# Patient Record
Sex: Male | Born: 1945 | Race: Black or African American | Hispanic: No | Smoking: Former smoker
Health system: Southern US, Community
[De-identification: ages and names within clinical notes are randomized; demographics above are authoritative.]

## PROBLEM LIST (undated history)

## (undated) DIAGNOSIS — R339 Retention of urine, unspecified: Secondary | ICD-10-CM

## (undated) DIAGNOSIS — E039 Hypothyroidism, unspecified: Secondary | ICD-10-CM

## (undated) DIAGNOSIS — E119 Type 2 diabetes mellitus without complications: Secondary | ICD-10-CM

## (undated) DIAGNOSIS — F1411 Cocaine abuse, in remission: Secondary | ICD-10-CM

## (undated) DIAGNOSIS — I5040 Unspecified combined systolic (congestive) and diastolic (congestive) heart failure: Secondary | ICD-10-CM

## (undated) DIAGNOSIS — I10 Essential (primary) hypertension: Secondary | ICD-10-CM

## (undated) DIAGNOSIS — K750 Abscess of liver: Secondary | ICD-10-CM

## (undated) DIAGNOSIS — D649 Anemia, unspecified: Secondary | ICD-10-CM

## (undated) DIAGNOSIS — I639 Cerebral infarction, unspecified: Secondary | ICD-10-CM

## (undated) DIAGNOSIS — Z960 Presence of urogenital implants: Secondary | ICD-10-CM

## (undated) DIAGNOSIS — E785 Hyperlipidemia, unspecified: Secondary | ICD-10-CM

## (undated) DIAGNOSIS — Z978 Presence of other specified devices: Secondary | ICD-10-CM

## (undated) DIAGNOSIS — I429 Cardiomyopathy, unspecified: Secondary | ICD-10-CM

## (undated) HISTORY — PX: TIBIA FRACTURE SURGERY: SHX806

## (undated) HISTORY — PX: APPENDECTOMY: SHX54

## (undated) HISTORY — PX: INGUINAL HERNIA REPAIR: SUR1180

---

## 2004-04-28 ENCOUNTER — Inpatient Hospital Stay (HOSPITAL_COMMUNITY): Admission: EM | Admit: 2004-04-28 | Discharge: 2004-04-29 | Payer: Self-pay | Admitting: Emergency Medicine

## 2005-02-11 ENCOUNTER — Ambulatory Visit: Payer: Self-pay | Admitting: Cardiology

## 2005-02-11 ENCOUNTER — Ambulatory Visit: Payer: Self-pay | Admitting: Physical Medicine & Rehabilitation

## 2005-02-11 ENCOUNTER — Inpatient Hospital Stay (HOSPITAL_COMMUNITY): Admission: EM | Admit: 2005-02-11 | Discharge: 2005-02-15 | Payer: Self-pay | Admitting: Emergency Medicine

## 2005-02-13 ENCOUNTER — Encounter (INDEPENDENT_AMBULATORY_CARE_PROVIDER_SITE_OTHER): Payer: Self-pay | Admitting: Cardiology

## 2005-04-27 ENCOUNTER — Ambulatory Visit: Payer: Self-pay | Admitting: Cardiology

## 2005-05-11 ENCOUNTER — Ambulatory Visit: Payer: Self-pay | Admitting: Cardiology

## 2005-05-11 ENCOUNTER — Ambulatory Visit: Payer: Self-pay

## 2005-05-17 ENCOUNTER — Inpatient Hospital Stay (HOSPITAL_BASED_OUTPATIENT_CLINIC_OR_DEPARTMENT_OTHER): Admission: RE | Admit: 2005-05-17 | Discharge: 2005-05-17 | Payer: Self-pay | Admitting: Cardiology

## 2005-05-17 ENCOUNTER — Ambulatory Visit: Payer: Self-pay | Admitting: Cardiology

## 2005-05-29 ENCOUNTER — Ambulatory Visit: Payer: Self-pay | Admitting: Cardiology

## 2007-01-01 ENCOUNTER — Emergency Department (HOSPITAL_COMMUNITY): Admission: EM | Admit: 2007-01-01 | Discharge: 2007-01-01 | Payer: Self-pay | Admitting: Emergency Medicine

## 2008-03-23 ENCOUNTER — Emergency Department (HOSPITAL_COMMUNITY): Admission: EM | Admit: 2008-03-23 | Discharge: 2008-03-23 | Payer: Self-pay | Admitting: Emergency Medicine

## 2008-10-30 DIAGNOSIS — I639 Cerebral infarction, unspecified: Secondary | ICD-10-CM

## 2008-10-30 HISTORY — DX: Cerebral infarction, unspecified: I63.9

## 2009-08-20 ENCOUNTER — Ambulatory Visit: Payer: Self-pay | Admitting: Family Medicine

## 2009-08-20 ENCOUNTER — Inpatient Hospital Stay (HOSPITAL_COMMUNITY): Admission: EM | Admit: 2009-08-20 | Discharge: 2009-08-26 | Payer: Self-pay | Admitting: Emergency Medicine

## 2009-08-23 ENCOUNTER — Ambulatory Visit: Payer: Self-pay | Admitting: Vascular Surgery

## 2009-08-23 ENCOUNTER — Encounter: Payer: Self-pay | Admitting: Family Medicine

## 2009-08-24 ENCOUNTER — Ambulatory Visit: Payer: Self-pay | Admitting: Physical Medicine & Rehabilitation

## 2009-08-26 ENCOUNTER — Ambulatory Visit: Payer: Self-pay | Admitting: Physical Medicine & Rehabilitation

## 2009-08-26 ENCOUNTER — Inpatient Hospital Stay (HOSPITAL_COMMUNITY)
Admission: RE | Admit: 2009-08-26 | Discharge: 2009-09-16 | Payer: Self-pay | Admitting: Physical Medicine & Rehabilitation

## 2009-10-06 ENCOUNTER — Encounter: Admission: RE | Admit: 2009-10-06 | Discharge: 2009-10-27 | Payer: Self-pay | Admitting: Internal Medicine

## 2009-10-26 ENCOUNTER — Encounter: Admission: RE | Admit: 2009-10-26 | Discharge: 2010-01-11 | Payer: Self-pay | Admitting: Internal Medicine

## 2009-12-01 ENCOUNTER — Emergency Department (HOSPITAL_COMMUNITY): Admission: EM | Admit: 2009-12-01 | Discharge: 2009-12-01 | Payer: Self-pay | Admitting: Emergency Medicine

## 2011-01-18 LAB — URINALYSIS, ROUTINE W REFLEX MICROSCOPIC
Bilirubin Urine: NEGATIVE
Glucose, UA: NEGATIVE mg/dL
Ketones, ur: NEGATIVE mg/dL
Nitrite: NEGATIVE
Specific Gravity, Urine: 1.03 (ref 1.005–1.030)
Urobilinogen, UA: 1 mg/dL (ref 0.0–1.0)
pH: 6 (ref 5.0–8.0)

## 2011-01-18 LAB — URINE CULTURE: Culture: NO GROWTH

## 2011-02-02 LAB — COMPREHENSIVE METABOLIC PANEL
ALT: 12 U/L (ref 0–53)
Albumin: 3.9 g/dL (ref 3.5–5.2)
Albumin: 4 g/dL (ref 3.5–5.2)
Alkaline Phosphatase: 57 U/L (ref 39–117)
Alkaline Phosphatase: 60 U/L (ref 39–117)
BUN: 16 mg/dL (ref 6–23)
CO2: 23 mEq/L (ref 19–32)
Calcium: 9.3 mg/dL (ref 8.4–10.5)
Creatinine, Ser: 1.1 mg/dL (ref 0.4–1.5)
GFR calc non Af Amer: >60 mL/min (ref >60)
Potassium: 4.2 mEq/L (ref 3.5–5.1)
Total Bilirubin: 0.6 mg/dL (ref 0.3–1.2)
Total Protein: 7.5 g/dL (ref 6.0–8.3)
Total Protein: 8 g/dL (ref 6.0–8.3)

## 2011-02-02 LAB — CBC
HCT: 39.3 % (ref 39.0–52.0)
HCT: 39.8 % (ref 39.0–52.0)
HCT: 40.6 % (ref 39.0–52.0)
HCT: 40.8 % (ref 39.0–52.0)
HCT: 42.4 % (ref 39.0–52.0)
Hemoglobin: 13.4 g/dL (ref 13.0–17.0)
Hemoglobin: 13.7 g/dL (ref 13.0–17.0)
Hemoglobin: 13.7 g/dL (ref 13.0–17.0)
Hemoglobin: 14.9 g/dL (ref 13.0–17.0)
MCHC: 34 g/dL (ref 30.0–36.0)
MCHC: 34.2 g/dL (ref 30.0–36.0)
MCV: 92.2 fL (ref 78.0–100.0)
MCV: 92.8 fL (ref 78.0–100.0)
Platelets: 176 10*3/uL (ref 150–400)
Platelets: 212 10*3/uL (ref 150–400)
Platelets: 215 10*3/uL (ref 150–400)
RBC: 4.39 MIL/uL (ref 4.22–5.81)
RDW: 13 % (ref 11.5–15.5)
RDW: 13.1 % (ref 11.5–15.5)
RDW: 13.2 % (ref 11.5–15.5)
WBC: 7.1 10*3/uL (ref 4.0–10.5)
WBC: 9 10*3/uL (ref 4.0–10.5)

## 2011-02-02 LAB — RAPID URINE DRUG SCREEN, HOSP PERFORMED
Barbiturates: NOT DETECTED
Benzodiazepines: NOT DETECTED
Opiates: NOT DETECTED

## 2011-02-02 LAB — DIFFERENTIAL
Basophils Absolute: 0 10*3/uL (ref 0.0–0.1)
Basophils Relative: 0 % (ref 0–1)
Eosinophils Absolute: 0 10*3/uL (ref 0.0–0.7)
Eosinophils Relative: 0 % (ref 0–5)
Lymphocytes Relative: 12 % (ref 12–46)
Lymphocytes Relative: 35 % (ref 12–46)
Lymphs Abs: 2.2 10*3/uL (ref 0.7–4.0)
Monocytes Absolute: 0.7 10*3/uL (ref 0.1–1.0)
Monocytes Relative: 10 % (ref 3–12)
Monocytes Relative: 4 % (ref 3–12)
Neutro Abs: 3.4 10*3/uL (ref 1.7–7.7)
Neutro Abs: 8.7 10*3/uL — ABNORMAL HIGH (ref 1.7–7.7)
Neutrophils Relative %: 53 % (ref 43–77)

## 2011-02-02 LAB — CARDIAC PANEL(CRET KIN+CKTOT+MB+TROPI)
CK, MB: 1.7 ng/mL (ref 0.3–4.0)
CK, MB: 1.8 ng/mL (ref 0.3–4.0)
Relative Index: 1.4 (ref 0.0–2.5)
Relative Index: 1.5 (ref 0.0–2.5)
Total CK: 125 U/L (ref 7–232)

## 2011-02-02 LAB — BASIC METABOLIC PANEL
BUN: 11 mg/dL (ref 6–23)
CO2: 22 mEq/L (ref 19–32)
Calcium: 9.1 mg/dL (ref 8.4–10.5)
GFR calc Af Amer: >60 mL/min (ref >60)
GFR calc non Af Amer: >60 mL/min (ref >60)
GFR calc non Af Amer: >60 mL/min (ref >60)
Glucose, Bld: 107 mg/dL — ABNORMAL HIGH (ref 70–99)
Potassium: 3.6 mEq/L (ref 3.5–5.1)
Sodium: 137 mEq/L (ref 135–145)
Sodium: 139 mEq/L (ref 135–145)

## 2011-02-02 LAB — POCT CARDIAC MARKERS
Myoglobin, poc: 169 ng/mL (ref 12–200)
Troponin i, poc: <0.05 ng/mL (ref 0.00–0.09)

## 2011-02-02 LAB — LIPID PANEL
Cholesterol: 198 mg/dL (ref 0–200)
Total CHOL/HDL Ratio: 5.8 RATIO
Triglycerides: 139 mg/dL (ref <150)

## 2011-02-02 LAB — T4, FREE: Free T4: 0.84 ng/dL (ref 0.80–1.80)

## 2011-02-02 LAB — TSH: TSH: 26.675 u[IU]/mL — ABNORMAL HIGH (ref 0.350–4.500)

## 2011-02-02 LAB — GLUCOSE, CAPILLARY: Glucose-Capillary: 181 mg/dL — ABNORMAL HIGH (ref 70–99)

## 2011-03-17 NOTE — Cardiovascular Report (Signed)
NAME:  Brad Harrell, PULS NO.:  [id number]   MEDICAL RECORD NO.:  [id number]          PATIENT TYPE:  OIB   LOCATION:  6501                         FACILITY:  [facility]   PHYSICIAN:  Brad Harrell, M.D. [facility] DATE OF BIRTH:  05-24-1946   DATE OF PROCEDURE:  05/17/2005  DATE OF DISCHARGE:                              CARDIAC CATHETERIZATION   CLINICAL HISTORY:  Brad Harrell is 65 years old and was admitted in April  2006 with a stroke. An echocardiogram showed ejection fraction of 35-40%. He  had history of alcohol and substance abuse. He saw Dr. Antoine Harrell back who  arranged for him to have a Cardiolite scan. On the day of the scan his ECG  showed some new anterolateral and inferior T wave changes and we elected not  to proceed with the scan, and to instead evaluate him with coronary  angiography which was scheduled today in the outpatient laboratory.   PROCEDURE:  Right heart catheterization was performed percutaneously by the  right femoral vein using a venous sheath and Swan Ganz thermodilution  catheterization. Left heart catheterization was performed percutaneously by  the right femoral artery and arterial sheath and 6 French preformed coronary  catheters. A femoral wall arterial punch was performed and Omnipaque  contrast was used. The patient tolerated the procedure well and left the  laboratory in satisfactory condition.   RESULTS:  The left main coronary artery had some mild tapering at its distal  portion estimated at about 30%.   The left anterior descending coronary artery gave rise to a diagonal branch  and three septal perforators. There was 40% narrowing at the ostium which  extended slightly into the left main coronary artery.   The circumflex artery gave rise to a margin branch and two posterolateral  branches. There was a 90% ostial stenosis in the circumflex artery.   The right coronary artery was a moderate-sized vessel, gave rise to right  ventricular  branches, a posterior descending branch, and two posterolateral  branches. These vessels were free of significant disease.   The left ventriculogram performed in the ROA projection shows global  hypokinesis with an estimated ejection fraction of 35%.   The left ventriculogram performed in the LOA projection also showed global  hypokinesis.   HEMODYNAMIC DATA:  The right atrial pressure was 8 mean. The pulmonary  artery pressure was 35/15 with a mean of 21. Pulmonary wedge pressure was 10  mean. Left ventricular pressure was 136/7. The aortic pressure was 136/74  with mean of 96. Cardiac output/cardiac index was 3.6/2.1 L/minute/sqm.   CONCLUSION:  1.  Nonischemic cardiomyopathy with an ejection fraction of 35%.  2.  Coronary artery disease with 30% narrowing in the distal left main      coronary artery, 40% narrowing in the ostium of the left anterior      descending coronary artery, 90% ostium in the circumflex artery, and no      significant obstruction of the right coronary artery.   RECOMMENDATIONS:  The patient has what appears to be a significant lesion in  the ostium of the circumflex artery  but this is unfavorable for PCI due to  its ostial location and due to disease in the left main and the ostium of  the LAD contiguous to this. The patient is not having any symptoms. I think  the best option is medical therapy. Will plan to let the patient go home  today and see Dr. Antoine Harrell in follow-up.       BB/MEDQ  D:  05/17/2005  T:  05/17/2005  Job:  [id number]   cc:   Brad Harrell, M.D.  [address]  [address]  [place]  [place] [address]   Cardiopulmonary Lab
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
## 2011-03-17 NOTE — Discharge Summary (Signed)
NAMEALUCARD, FEARNOW              ACCOUNT NO.:  [id number]   MEDICAL RECORD NO.:  [id number]          PATIENT TYPE:  INP   LOCATION:  3003                         FACILITY:  [facility]   PHYSICIAN:  Melissa L. Ladona Ridgel, MD  DATE OF BIRTH:  15-Nov-1945   DATE OF ADMISSION:  02/11/2005  DATE OF DISCHARGE:  02/15/2005                                 DISCHARGE SUMMARY   HISTORY OF PRESENT ILLNESS:  Please see previously dictated Discharge  Summary, and note that the patient continued to work with physical therapy  during the course of his hospital stay which was determined to be  inappropriate for inpatient rehab stay.  He was observed today walking with  physical therapy, and appears to be progressing well on his own.  We will,  therefore, support the discharge of the patient home with home physical  therapy.  The patient was evaluated by Cardiology for his newly discovered  cardiomyopathy.  He will be followed as an outpatient for further management  of his cardiomyopathy.  There is a potential for cardiac catheterization as  an outpatient.  Of note, the patient underwent evaluation for thyroid  disease and was found to have an elevated TSH at 9.528, likely consistent  with hypothyroidism.  We would, therefore, start him on low dose Synthroid  and have him follow up with his primary care physician for further  evaluation.  The patient also underwent chest x-ray, the advise of the  cardiologist who found no acute disease on this film.   PHYSICAL EXAMINATION:  VITAL SIGNS:  At this time, the patient's physical  examination remains unchanged.  His vital signs remain stable.  Temperature  97.4, blood pressure 108/63, pulse 72, respirations 20, saturation 96%.  GENERAL APPEARANCE:  He is in no acute distress.  HEENT:  Pupils equal, round and reactive to light.  Extraocular muscles  intact.  Mucous membranes moist.  NECK:  Supple.  No JVD or lymph nodes.  No carotid bruits.  CHEST:  Clear to  auscultation.  No rhonchi, rales or wheezes.  CARDIOVASCULAR:  Regular rate and rhythm, positive S1, S2.  No S3, S4.  ABDOMEN:  Soft, nontender, nondistended.  EXTREMITIES:  He remains with some left lower extremity weakness and a  minimal left upper extremity weakness.  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   DISPOSITION:  At this time, the patient is deemed stable for discharge to  home.   FOLLOWUP:  Home Health Physical Therapy.   DISCHARGE INSTRUCTIONS:  The patient will not be permitted to drive  secondary to his left hemi-neglect.  He, again, has been instructed to avoid  cocaine and will receive a call from [facility] for outpatient  follow up.      MLT/MEDQ  D:  02/15/2005  T:  02/15/2005  Job:  [id number]

## 2011-04-30 ENCOUNTER — Emergency Department (HOSPITAL_COMMUNITY): Payer: Medicare Other

## 2011-04-30 ENCOUNTER — Inpatient Hospital Stay (HOSPITAL_COMMUNITY)
Admission: EM | Admit: 2011-04-30 | Discharge: 2011-05-05 | DRG: 065 | Disposition: A | Discharge status: Skilled Nursing Facility | Payer: Medicare Other | Attending: Internal Medicine | Admitting: Internal Medicine

## 2011-04-30 DIAGNOSIS — Z8249 Family history of ischemic heart disease and other diseases of the circulatory system: Secondary | ICD-10-CM

## 2011-04-30 DIAGNOSIS — E7800 Pure hypercholesterolemia, unspecified: Secondary | ICD-10-CM | POA: Diagnosis present

## 2011-04-30 DIAGNOSIS — Z8673 Personal history of transient ischemic attack (TIA), and cerebral infarction without residual deficits: Secondary | ICD-10-CM

## 2011-04-30 DIAGNOSIS — Z87891 Personal history of nicotine dependence: Secondary | ICD-10-CM

## 2011-04-30 DIAGNOSIS — I5022 Chronic systolic (congestive) heart failure: Secondary | ICD-10-CM | POA: Diagnosis present

## 2011-04-30 DIAGNOSIS — G8190 Hemiplegia, unspecified affecting unspecified side: Secondary | ICD-10-CM | POA: Diagnosis present

## 2011-04-30 DIAGNOSIS — E039 Hypothyroidism, unspecified: Secondary | ICD-10-CM | POA: Diagnosis present

## 2011-04-30 DIAGNOSIS — Z833 Family history of diabetes mellitus: Secondary | ICD-10-CM

## 2011-04-30 DIAGNOSIS — F1411 Cocaine abuse, in remission: Secondary | ICD-10-CM | POA: Diagnosis present

## 2011-04-30 DIAGNOSIS — Z794 Long term (current) use of insulin: Secondary | ICD-10-CM

## 2011-04-30 DIAGNOSIS — I10 Essential (primary) hypertension: Secondary | ICD-10-CM | POA: Diagnosis present

## 2011-04-30 DIAGNOSIS — E785 Hyperlipidemia, unspecified: Secondary | ICD-10-CM | POA: Diagnosis present

## 2011-04-30 DIAGNOSIS — I6350 Cerebral infarction due to unspecified occlusion or stenosis of unspecified cerebral artery: Principal | ICD-10-CM | POA: Diagnosis present

## 2011-04-30 DIAGNOSIS — Z7902 Long term (current) use of antithrombotics/antiplatelets: Secondary | ICD-10-CM

## 2011-04-30 DIAGNOSIS — I428 Other cardiomyopathies: Secondary | ICD-10-CM | POA: Diagnosis present

## 2011-04-30 DIAGNOSIS — F1011 Alcohol abuse, in remission: Secondary | ICD-10-CM | POA: Diagnosis present

## 2011-04-30 DIAGNOSIS — I69959 Hemiplegia and hemiparesis following unspecified cerebrovascular disease affecting unspecified side: Secondary | ICD-10-CM

## 2011-04-30 DIAGNOSIS — H5440 Blindness, one eye, unspecified eye: Secondary | ICD-10-CM | POA: Diagnosis present

## 2011-04-30 DIAGNOSIS — E119 Type 2 diabetes mellitus without complications: Secondary | ICD-10-CM | POA: Diagnosis present

## 2011-04-30 DIAGNOSIS — I509 Heart failure, unspecified: Secondary | ICD-10-CM | POA: Diagnosis present

## 2011-04-30 LAB — COMPREHENSIVE METABOLIC PANEL
ALT: 18 U/L (ref 0–53)
Alkaline Phosphatase: 65 U/L (ref 39–117)
CO2: 21 mEq/L (ref 19–32)
Calcium: 9.9 mg/dL (ref 8.4–10.5)
GFR calc Af Amer: >60 mL/min (ref >60)
GFR calc non Af Amer: >60 mL/min (ref >60)
Glucose, Bld: 226 mg/dL — ABNORMAL HIGH (ref 70–99)
Potassium: 4 mEq/L (ref 3.5–5.1)
Sodium: 138 mEq/L (ref 135–145)
Total Bilirubin: 0.3 mg/dL (ref 0.3–1.2)

## 2011-04-30 LAB — URINALYSIS, ROUTINE W REFLEX MICROSCOPIC
Glucose, UA: >1000 mg/dL — AB
Ketones, ur: NEGATIVE mg/dL
Leukocytes, UA: NEGATIVE
pH: 5.5 (ref 5.0–8.0)

## 2011-04-30 LAB — POCT I-STAT, CHEM 8
BUN: 16 mg/dL (ref 6–23)
Creatinine, Ser: 0.9 mg/dL (ref 0.50–1.35)
Glucose, Bld: 231 mg/dL — ABNORMAL HIGH (ref 70–99)
HCT: 45 % (ref 39.0–52.0)
Potassium: 4 mEq/L (ref 3.5–5.1)
Sodium: 139 mEq/L (ref 135–145)

## 2011-04-30 LAB — DIFFERENTIAL
Lymphocytes Relative: 29 % (ref 12–46)
Monocytes Absolute: 0.8 10*3/uL (ref 0.1–1.0)
Monocytes Relative: 9 % (ref 3–12)
Neutro Abs: 5.3 10*3/uL (ref 1.7–7.7)
Neutrophils Relative %: 61 % (ref 43–77)

## 2011-04-30 LAB — CBC
HCT: 40.7 % (ref 39.0–52.0)
Hemoglobin: 14.3 g/dL (ref 13.0–17.0)
MCH: 30.4 pg (ref 26.0–34.0)
MCHC: 35.1 g/dL (ref 30.0–36.0)
RBC: 4.7 MIL/uL (ref 4.22–5.81)

## 2011-04-30 LAB — RAPID URINE DRUG SCREEN, HOSP PERFORMED
Amphetamines: NOT DETECTED
Cocaine: NOT DETECTED
Opiates: NOT DETECTED

## 2011-04-30 LAB — URINE MICROSCOPIC-ADD ON

## 2011-04-30 LAB — CK TOTAL AND CKMB (NOT AT ARMC): Total CK: 122 U/L (ref 7–232)

## 2011-04-30 LAB — PROTIME-INR: Prothrombin Time: 13.1 seconds (ref 11.6–15.2)

## 2011-05-01 ENCOUNTER — Inpatient Hospital Stay (HOSPITAL_COMMUNITY): Payer: Medicare Other

## 2011-05-01 LAB — COMPREHENSIVE METABOLIC PANEL
ALT: 16 U/L (ref 0–53)
AST: 17 U/L (ref 0–37)
Albumin: 3.8 g/dL (ref 3.5–5.2)
Alkaline Phosphatase: 55 U/L (ref 39–117)
Chloride: 105 mEq/L (ref 96–112)
Potassium: 3.9 mEq/L (ref 3.5–5.1)
Total Bilirubin: 0.5 mg/dL (ref 0.3–1.2)

## 2011-05-01 LAB — LIPID PANEL
LDL Cholesterol: 63 mg/dL (ref 0–99)
Total CHOL/HDL Ratio: 4.8 RATIO
VLDL: 33 mg/dL (ref 0–40)

## 2011-05-01 LAB — CBC
Hemoglobin: 13.1 g/dL (ref 13.0–17.0)
MCH: 29.4 pg (ref 26.0–34.0)
MCHC: 33.9 g/dL (ref 30.0–36.0)
MCV: 86.8 fL (ref 78.0–100.0)
RBC: 4.46 MIL/uL (ref 4.22–5.81)

## 2011-05-01 LAB — CARDIAC PANEL(CRET KIN+CKTOT+MB+TROPI)
CK, MB: 3.1 ng/mL (ref 0.3–4.0)
Relative Index: 2.2 (ref 0.0–2.5)
Relative Index: 2 (ref 0.0–2.5)
Troponin I: <0.3 ng/mL (ref <0.30)

## 2011-05-01 LAB — GLUCOSE, CAPILLARY
Glucose-Capillary: 155 mg/dL — ABNORMAL HIGH (ref 70–99)
Glucose-Capillary: 172 mg/dL — ABNORMAL HIGH (ref 70–99)

## 2011-05-01 LAB — HEMOGLOBIN A1C
Hgb A1c MFr Bld: 7.1 % — ABNORMAL HIGH (ref <5.7)
Mean Plasma Glucose: 157 mg/dL — ABNORMAL HIGH (ref <117)

## 2011-05-01 NOTE — H&P (Signed)
NAME:  Brad Harrell, Brad Harrell NO.:  [id number]  MEDICAL RECORD NO.:  [id number]  LOCATION:  [facility]                         FACILITY:  [facility]  PHYSICIAN:  Eduard Clos, MDDATE OF BIRTH:  16-Jun-1946  DATE OF ADMISSION:  04/30/2011 DATE OF DISCHARGE:                             HISTORY & PHYSICAL   PRIMARY CARE PHYSICIAN:  Fleet Contras, MD  CHIEF COMPLAINT:  Difficulty walking with left lower extremity weakness.  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  He went to the bathroom, after which he was not able to get up from the commode and he needed help mainly because he was not able to walk and he felt very weak, particularly in the left lower extremity. He said he is able to move his extremities but he is not able to put pressure on it as it is giving way.  He did not have any headache, visual symptoms.  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  He did not have any difficulty speaking or swallowing.  He did not lose function of the left upper extremity.  He has mild weakness on the right upper and right lower extremity which did not change.  He did not lose consciousness.  He was brought in the ER, he had a CT head without contrast which did not show anything acute.  Neurologist, Dr. Marjory Lies, was already called and I also spoke with Dr. Marjory Lies.  As per Dr. Marjory Lies at this time, he is not a candidate for any tPA as he is not having any definite new focal deficit and he has had similar complaints previously and Dr. Marjory Lies is at this time also evaluating the patient, we will follow the recommendations.  The patient denies any chest pain, shortness of breath, nausea, vomiting, abdominal pain, dysuria, discharge,  diarrhea.  Denies any headache.  Denies any fever, chills, cough, or phlegm.  PAST MEDICAL HISTORY: 1. History of hypertension. 2. Hyperlipidemia. 3. Hypothyroidism. 4. 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 5. History of cardiomyopathy with EF last measured in October 2010 was     30-35%. 6. History of polysubstance abuse including cocaine.  As per family,     the last time he used cocaine was in 2010.  MEDICATIONS PRIOR TO ADMISSION:  They do not remember the exact names, they said he takes a medicine for cholesterol and they do not recall exact doses and the names, we need to verify.  ALLERGIES:  No known drug allergies.  PAST SURGICAL HISTORY:  As per E-chart, he had appendectomy, cardiac cath, and repair of jaw fracture.  SOCIAL HISTORY:  The patient lives with the family, used to smoke cigarettes, drink alcohol and abuse drugs including cocaine which he states and his family state he has not been using since 2012.  FAMILY HISTORY:  Significant for mother having enlarged heart.  REVIEW OF SYSTEMS:  As per the history of presenting  illness, nothing else significant.  PHYSICAL EXAMINATION:  GENERAL:  The patient was examined at bedside, not in acute distress. VITAL SIGNS:  Blood pressure is 156/96, pulse 97 per minute, temperature 98, respirations 18 per minute, O2 sat 100%. HEENT:  The patient is blind in the right eye.  Face:  He has mild left facial weakness in the left lower half.  Tongue is midline.  No neck rigidity.  No discharge from ears, eyes, nose, or mouth. CHEST:  Bilateral air entry present.  No rhonchi, no crepitation. HEART:  S1 and S2 heard. ABDOMEN:  Soft, nontender.  Bowel sounds heard. CENTRAL NERVOUS SYSTEM:  The patient is alert, awake, oriented to time, place, and person.  He is able to move upper and lower extremities.  The right upper and right lower extremities are like 4/5 with decreased  grip strength which is chronic.  Left upper extremity is 5/5 and left lower extremity, also he is able to move well.  A few minutes ago when the tech tried to make him walk, he was not able to walk by himself because of the left leg giving way.  There is no pronator drift.  There is no dysdiadochokinesia and he does have difficulty walking. EXTREMITIES:  At this time does not show any edema.  No acute ischemic changes.  LABORATORY DATA:  EKG shows normal sinus rhythm, heart rate of 101 beats per minute with nonspecific ST-T changes, QRS 98 milliseconds, QTC is 456 milliseconds. CT of the head without contrast shows atrophy and small vessel disease, remote bifrontal infarcts, no acute intracranial findings. CBC:  WBC is 8.7, hemoglobin is 15.3, hematocrit is 45, platelets are 192.  PT and INR are 13.1 and 0.97.  Complete metabolic panel:  Sodium 139, potassium 4, chloride 112, carbon dioxide 21, anion gap is 6, glucose 231, BUN 16, creatinine 0.9, total bilirubin is 0.3, alkaline phosphatase 65, AST 19, ALT 18, total protein 8.5, albumin 4.3, calcium 9.9.  CK is 122, MB is 2.3, relative index 1.9, troponin less than 0.3.  ASSESSMENT: 1. Possible cerebrovascular accident. 2. History of previous cerebrovascular accident with right-sided     hemiparesis and right eye blindness. 3. History of previous polysubstance abuse. 4. Hyperglycemia, concerning for new-onset diabetes mellitus type 2. 5. History of hypertension. 6. History of hypothyroidism. 7. History of hyperlipidemia.  PLAN: 1. At this time, I will admit the patient to telemetry. 2. For his difficulty walking and with history of previous CVA, at     this time my concern is for a new CVA.  I did discuss with Dr.     Marjory Lies, Neurologist on-call, and feels that he is not a     candidate for tPA at this time because of his previous CVAs and     similar complaints and he is not having any definite focal     deficits.  He does  have weakness of the left lower extremity when     he tries to walk.  Dr. Marjory Lies is evaluating the patient at this     time, we will follow the recommendation.  At this time, we are     going to get MRI of the brain, MRA of the brain, 2-D echo, carotid     Doppler. 3. Hyperglycemia.  We will check hemoglobin A1c.  At this time, the     patient be on CBG a.c. and nightly and sliding scale coverage.  If     he does have a  hemoglobin A1c which is high and suggestive of     diabetes, then we need to start some antidiabetic medication. 4. History of hypertension and hypothyroidism.  At this time, we need     to verify his home medication.  We will check TSH.  At this time,     the patient will be on p.r.n. labetalol for blood pressure,     systolic more than 200s. 5. History of low EF.  At this time, we are going to recheck a 2-D     echo.  At this time, only gently we are going to hydrate the     patient and make sure that he does not get fluid overload. 6. The patient will be on aspirin.  We will also check a urine drug     screen and chest x-ray as the patient previously had history of     cigarette smoking and further recommendation as condition evolves.     Eduard Clos, MD     ANK/MEDQ  D:  04/30/2011  T:  04/30/2011  Job:  [id number]  cc:   Fleet Contras, M.D.  Electronically Signed by Midge Minium MD on 05/01/2011 06:26:19 AM

## 2011-05-02 DIAGNOSIS — I69993 Ataxia following unspecified cerebrovascular disease: Secondary | ICD-10-CM

## 2011-05-02 DIAGNOSIS — G8100 Flaccid hemiplegia affecting unspecified side: Secondary | ICD-10-CM

## 2011-05-02 LAB — GLUCOSE, CAPILLARY
Glucose-Capillary: 123 mg/dL — ABNORMAL HIGH (ref 70–99)
Glucose-Capillary: 128 mg/dL — ABNORMAL HIGH (ref 70–99)

## 2011-05-03 LAB — GLUCOSE, CAPILLARY: Glucose-Capillary: 135 mg/dL — ABNORMAL HIGH (ref 70–99)

## 2011-05-04 LAB — GLUCOSE, CAPILLARY
Glucose-Capillary: 138 mg/dL — ABNORMAL HIGH (ref 70–99)
Glucose-Capillary: 94 mg/dL (ref 70–99)
Glucose-Capillary: 166 mg/dL — ABNORMAL HIGH (ref 70–99)

## 2011-05-05 LAB — GLUCOSE, CAPILLARY
Glucose-Capillary: 108 mg/dL — ABNORMAL HIGH (ref 70–99)
Glucose-Capillary: 120 mg/dL — ABNORMAL HIGH (ref 70–99)

## 2011-05-11 LAB — T4, FREE: Free T4: 0.85 ng/dL (ref 0.80–1.80)

## 2011-05-17 NOTE — Discharge Summary (Signed)
Brad Harrell, Brad Harrell NO.:  [id number]  MEDICAL RECORD NO.:  [id number]  LOCATION:  3030                         FACILITY:  [facility]  PHYSICIAN:  Thad Ranger, MD       DATE OF BIRTH:  1946/06/19  DATE OF ADMISSION:  04/30/2011 DATE OF DISCHARGE:                              DISCHARGE SUMMARY   PRIMARY CARE PHYSICIAN:  Fleet Contras, MD  DISCHARGE DIAGNOSES: 1. Acute infarction/cerebrovascular accident right thalamus. 2. New-onset diabetes mellitus. 3. Hypothyroidism uncontrolled. 4. Hypertension. 5. Hyperlipidemia. 6. History of prior cerebrovascular accident with right-sided     hemiparesis and right eye blindness. 7. 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  CONSULTATIONS:  Neurology, Dr. Marjory Lies.  DISCHARGE MEDICATIONS: 1. Coreg 3.125 mg p.o. b.i.d. 2. Plavix 75 mg p.o. daily. 3. Glipizide 5 mg p.o. daily. 4. Insulin NovoLog sliding scale 1-9 units t.i.d. with meals. 5. Synthroid 100 mcg p.o. daily before breakfast. 6. Lisinopril 10 mg p.o. daily. 7. Simvastatin 40 mg p.o. daily.  BRIEF HISTORY OF PRESENT ILLNESS AT THE TIME OF ADMISSION:  Brad Harrell is a 65 year old male with previous history of CVA with right-sided hemiparesis and history of polysubstance abuse, previously history of hypertension, hypothyroidism, hyperlipidemia who was in the church on the day of admission around 5:30 p.m.  The patient went to the bathroom after which he was not able to get up from the commode.  The patient needed help mainly because he was not able to walk and felt very weak, particularly in the left lower extremity.  He was able to move his extremities but was not able to put any pressure on it as it was giving away.  He was brought to the emergency room where he had a CT head without contrast which did not show anything acute.  Neurology was consulted and the patient was evaluated by Dr.  Marjory Lies.  IV t-PA was not given due to mild and resolving symptoms after Neurology evaluation by Dr. Marjory Lies.  RADIOLOGICAL DATA:  CT head without contrast April 30, 2011, atrophy and small vessel disease remote right frontal infarct, no acute intracranial findings.  Chest x-ray two-view April 30, 2011, minimal left base atelectasis, borderline heart size.  MRI of the brain May 01, 2011, showed 1-cm acute infarction within the right thalamus, extensive chronic small vessel changes __________ brain, old inferior cerebellar infarction on the left, old right posterior frontal infarction at the vertex.  MRA diffuse intracranial atherosclerotic irregularity in the medium two small vessels of the inferior and posterior spiculation, no major vessel occlusion stenosis at the left vertebrobasilar junction. Two-D echo May 01, 2011, EF of 30-35%, diffuse hypokinesis, grade 1 diastolic dysfunction.  No cardiac source of emboli was identified. Comparing with previous echo from October 2010, the EF has not been reduced and is similar with the previous echo in October 2010.  Carotid Dopplers May 01, 2011, bilateral intimal wall thickening common carotid artery mild, plaque origin in ICA right, mild soft plaque noted in the left origin ICA, no significant ICA stenosis.  Vertebral artery flow is antegrade.  PERTINENT LAB DIAGNOSTIC DATA:  UA negative for any UTI,  more than 1000 glucose.  Urine drug screen negative.  Lipid profile cholesterol 121, triglycerides 153, LDL 63.  HbA1c 7.1, troponin less than 0.3.  TSH 14.24, free T4 0.8.  BRIEF HOSPITALIZATION COURSE:  Brad Harrell is a 65 year old male who presented with difficulty walking with prior history of CVA.  The patient was admitted for possible CVA. 1. Acute right thalamic CVA.  The patient was admitted to Medicine     Service on Neurology floor.  Neurology consultation was obtained.     Code stroke was activated.  The patient was evaluated by  Dr.     Marjory Lies.  IV tPA was not given due to mild and resolving     symptoms.  The patient underwent full stroke workup.  Please refer     to the detailed report as above.  Given the patient he was already     on aspirin he was placed on Plavix per Neurology recommendation.     The patient was also noted to have new-onset diabetes mellitus.     PT/OT evaluation was done as well as CIR consult, the patient needs     skilled nursing facility at this point. 2. New-onset diabetes mellitus.  Extensive teaching was done for the     patient.  HbA1c was 7.17.  The patient was placed on sliding-scale     insulin while inpatient.  He is also placed on glipizide p.o. and     sliding-scale insulin. 3. Hypothyroidism, not well controlled and TSH was elevated at 14.24,     hence Synthroid was increased to 100 mcg daily. 4. Hypertension with history of chronic systolic CHF.  Two-D     echocardiogram was obtained which showed EF of 30-35% with diffuse     hypokinesis.  The patient's prior echocardiogram in 2010 also     revealed the same EF, hence no huge reduction in the ejection     fraction.  Coreg was also added and the patient remained on     lisinopril, Plavix, and statins.  The patient will be discharged to     skilled nursing facility pending bed available.  PHYSICAL EXAMINATION:  VITAL SIGNS:  Temperature 97.8, pulse 74, respirations 17, blood pressure 119/74, O2 sats 94% on room air. GENERAL:  The patient is alert, awake and oriented x3 not in acute distress. HEENT:  The patient is blind in the right eye. CHEST:  No rhonchi, crepitations. CV: S1, S2 clear. ABDOMEN:  Soft, nontender, nondistended. EXTREMITIES:  No cyanosis, clubbing or edema.  DISCHARGE FOLLOWUP:  With Dr. Fleet Contras in 2-3 weeks and Dr. Pearlean Brownie in 2 months.  DISCHARGE TIME:  35 minutes.     Thad Ranger, MD     RR/MEDQ  D:  05/05/2011  T:  05/05/2011  Job:  [id number]  cc:   Pramod P. Pearlean Brownie, MD Joycelyn Schmid, MD Fleet Contras, M.D.  Electronically Signed by Andres Labrum Lurie Mullane  on 05/17/2011 05:38:41 PM

## 2011-05-17 NOTE — Consult Note (Signed)
NAME:  Brad Harrell, Brad Harrell NO.:  [id number]  MEDICAL RECORD NO.:  [id number]  LOCATION:  [facility]                         FACILITY:  [facility]  PHYSICIAN:  Joycelyn Schmid, MD   DATE OF BIRTH:  02/15/1946  DATE OF CONSULTATION:  04/30/2011 DATE OF DISCHARGE:                                CONSULTATION   TIME:  10:30 p.m.  CHIEF COMPLAINT:  Inability to walk, left leg weakness.  Last seen normal is unclear, but estimated to be 5:30 p.m.  Code stroke activated at 2014.  Initial NIH stroke scale 4.  IV t-PA was not given due to mild and resolving symptoms.  HISTORY OF PRESENT ILLNESS:  A 65 year old right-handed male with history of hypertension, cardiomyopathy, remote history of alcohol, tobacco and illicit drug use, history of stroke in 2006 and 2010, was in normal state of health and was at church today.  He went to church around 4 o'clock p.m., around 5:30 p.m., he tried to stand up, but felt "stiff in the left leg."  He is having difficulty standing up and he felt that his left leg would give out.  He is able to make it to the bathroom using assistance of a friend.  When he went to sit down in the commode and then tried to stand up, he felt quite weak and dizzy.  He felt off balance.  He was brought to the hospital for further evaluation.  After arriving to the hospital, his symptoms have gradually improved.  He still feels that he is not able to balance and walk like he used to, although he feels that his left leg weakness has significantly improved.  PAST MEDICAL HISTORY: 1. Hypertension. 2. Hypercholesterolemia. 3. Cardiomyopathy. 4. Stroke (right frontal stroke in 2006, left frontal and left     cerebellar stroke in 2010).  MEDICATIONS:  The patient is not sure of his medications.  He thinks he takes aspirin 81 mg daily and a blood pressure pill.  ALLERGIES:  No known drug allergies.  FAMILY HISTORY:  Hypertension, diabetes, coronary artery  disease.  SOCIAL HISTORY:  Lives with his wife and daughter.  The patient quit tobacco, alcohol and illicit drug use cocaine in 2010 after last stroke.  REVIEW OF SYSTEMS:  As per the HPI.  Denies any chest pain, shortness of breath, nausea, vomiting, diarrhea, fevers, chills, shortness of breath. He normally ambulates with a cane due to mild residual right-sided right leg weakness.  PHYSICAL EXAMINATION:  VITAL SIGNS:  Blood pressure 164/91, heart rate 91, respirations 18, 100% on 2 L nasal cannula. GENERAL:  He is awake, alert.  Language is fluent and comprehension intact. CRANIAL NERVE:  Right eye pupil is opacified and irregular.  No reaction to light.  No light perception.  The eye demonstrates scleral injection in the right side.  There is exotropia.  The right eye is slightly deviated outward and inferiorly.  Left eye, pupils reactive from 2-1 mm. Visual fields full to confrontation.  Extraocular muscles intact. Facial sensation is symmetric.  He has decreased left nasolabial fold. Uvula is midline.  Shoulder is symmetric.  Tongue is midline.MOTOR:  Mild weakness of the right upper, right lower extremity  4/5.  No drift in the right upper and the bilateral upper extremities.  He has mild drift in the right lower extremity, looks like he has full strength. SENSORY:  Intact to pinprick, light touch, temperature, vibration. Cerebellar testing, finger-nose-finger.  He has mild ataxia in the left upper and left lower extremities. REFLEXES:  Trace in the upper and lower extremities.  Downgoing toes. CARDIOVASCULAR:  Regular rate and rhythm.  No murmurs.  No carotid bruits.  DIAGNOSTIC DATA:  Lab testing; white count 8.7, platelets 192.  CK 122, troponin less than 0.30.  LFTs protein elevated at 8.5.  Sodium 138, BUN 16, creatinine 1.0, glucose 226.  CT scan of head, which I reviewed shows bifrontal subcortical ischemic infarctions and left cerebellar artery ischemic infarction and  these appear chronic.  No significant change from prior CT from 2010.  No intracerebral hemorrhage.  ASSESSMENT AND RECOMMENDATIONS:  A 65 year old male with hypertension, cardiomyopathy, hypercholesteremia, now with transient left leg weakness.  Differential diagnoses stroke versus transient ischemic attack.  RECOMMENDATIONS:  MRI of the brain, MRA of the head, carotid ultrasound, transthoracic echocardiogram, fasting lipid profile, hemoglobin A1c, urine drug screen.  The patient will be evaluated with physical therapy, occupational therapy, speech therapy.  Continue aspirin.  We will obtain home medication, gradual blood pressure reduction, keep systolic blood pressure less than 200 without aggressive blood pressure lowering.  I discussed my findings with the patient and his wife.     Joycelyn Schmid, MD     VP/MEDQ  D:  04/30/2011  T:  04/30/2011  Job:  [id number]  Electronically Signed by Joycelyn Schmid  on 05/17/2011 10:22:19 PM

## 2011-08-16 ENCOUNTER — Ambulatory Visit: Payer: Medicare Other | Admitting: Occupational Therapy

## 2011-08-16 ENCOUNTER — Ambulatory Visit: Payer: Medicare Other | Attending: Internal Medicine | Admitting: Physical Therapy

## 2011-08-16 DIAGNOSIS — I69998 Other sequelae following unspecified cerebrovascular disease: Secondary | ICD-10-CM | POA: Insufficient documentation

## 2011-08-16 DIAGNOSIS — Z5189 Encounter for other specified aftercare: Secondary | ICD-10-CM | POA: Insufficient documentation

## 2011-08-16 DIAGNOSIS — M6281 Muscle weakness (generalized): Secondary | ICD-10-CM | POA: Insufficient documentation

## 2011-08-16 DIAGNOSIS — R279 Unspecified lack of coordination: Secondary | ICD-10-CM | POA: Insufficient documentation

## 2011-08-16 DIAGNOSIS — R269 Unspecified abnormalities of gait and mobility: Secondary | ICD-10-CM | POA: Insufficient documentation

## 2011-08-22 ENCOUNTER — Ambulatory Visit: Payer: Medicare Other | Admitting: Physical Therapy

## 2011-08-22 ENCOUNTER — Encounter: Payer: Medicare Other | Admitting: Occupational Therapy

## 2011-08-22 ENCOUNTER — Ambulatory Visit: Payer: Medicare Other | Admitting: Occupational Therapy

## 2011-08-23 ENCOUNTER — Ambulatory Visit: Payer: Medicare Other | Admitting: Physical Therapy

## 2011-08-25 ENCOUNTER — Ambulatory Visit: Payer: Medicare Other | Admitting: Occupational Therapy

## 2011-08-29 ENCOUNTER — Encounter: Payer: Medicare Other | Admitting: Occupational Therapy

## 2011-08-29 ENCOUNTER — Ambulatory Visit: Payer: Medicare Other | Admitting: Physical Therapy

## 2011-08-31 ENCOUNTER — Encounter: Payer: Medicare Other | Admitting: Occupational Therapy

## 2011-08-31 ENCOUNTER — Ambulatory Visit: Payer: Medicare Other | Attending: Internal Medicine | Admitting: Physical Therapy

## 2011-08-31 DIAGNOSIS — I69998 Other sequelae following unspecified cerebrovascular disease: Secondary | ICD-10-CM | POA: Insufficient documentation

## 2011-08-31 DIAGNOSIS — M6281 Muscle weakness (generalized): Secondary | ICD-10-CM | POA: Insufficient documentation

## 2011-08-31 DIAGNOSIS — R279 Unspecified lack of coordination: Secondary | ICD-10-CM | POA: Insufficient documentation

## 2011-08-31 DIAGNOSIS — R269 Unspecified abnormalities of gait and mobility: Secondary | ICD-10-CM | POA: Insufficient documentation

## 2011-08-31 DIAGNOSIS — Z5189 Encounter for other specified aftercare: Secondary | ICD-10-CM | POA: Insufficient documentation

## 2011-09-01 ENCOUNTER — Encounter: Payer: Medicare Other | Admitting: Occupational Therapy

## 2011-09-05 ENCOUNTER — Encounter: Payer: Medicare Other | Admitting: Occupational Therapy

## 2011-09-05 ENCOUNTER — Ambulatory Visit: Payer: Medicare Other | Admitting: Physical Therapy

## 2011-09-07 ENCOUNTER — Ambulatory Visit: Payer: Medicare Other | Admitting: Physical Therapy

## 2011-09-07 ENCOUNTER — Ambulatory Visit: Payer: Medicare Other | Admitting: Occupational Therapy

## 2011-09-12 ENCOUNTER — Ambulatory Visit: Payer: Medicare Other | Admitting: Physical Therapy

## 2011-09-12 ENCOUNTER — Encounter: Payer: Medicare Other | Admitting: Occupational Therapy

## 2011-09-13 ENCOUNTER — Ambulatory Visit: Payer: Medicare Other | Admitting: Occupational Therapy

## 2011-09-14 ENCOUNTER — Ambulatory Visit: Payer: Medicare Other | Admitting: Physical Therapy

## 2011-09-14 ENCOUNTER — Encounter: Payer: Medicare Other | Admitting: Occupational Therapy

## 2011-09-15 ENCOUNTER — Ambulatory Visit: Payer: Medicare Other | Admitting: Occupational Therapy

## 2011-09-15 ENCOUNTER — Ambulatory Visit: Payer: Medicare Other | Admitting: Physical Therapy

## 2011-09-20 ENCOUNTER — Ambulatory Visit: Payer: Medicare Other | Admitting: Physical Therapy

## 2011-09-20 ENCOUNTER — Encounter: Payer: Medicare Other | Admitting: Occupational Therapy

## 2011-09-26 ENCOUNTER — Ambulatory Visit: Payer: Medicare Other | Admitting: Occupational Therapy

## 2011-09-26 ENCOUNTER — Ambulatory Visit: Payer: Medicare Other | Admitting: Physical Therapy

## 2011-09-28 ENCOUNTER — Ambulatory Visit: Payer: Medicare Other | Admitting: Physical Therapy

## 2011-09-28 ENCOUNTER — Ambulatory Visit: Payer: Medicare Other | Admitting: Occupational Therapy

## 2011-10-03 ENCOUNTER — Ambulatory Visit: Payer: Medicare Other | Admitting: Physical Therapy

## 2011-10-03 ENCOUNTER — Encounter: Payer: Medicare Other | Admitting: Occupational Therapy

## 2011-10-05 ENCOUNTER — Encounter: Payer: Medicare Other | Admitting: Occupational Therapy

## 2011-10-05 ENCOUNTER — Ambulatory Visit: Payer: Medicare Other | Admitting: Physical Therapy

## 2011-10-10 ENCOUNTER — Encounter: Payer: Medicare Other | Admitting: Occupational Therapy

## 2011-10-10 ENCOUNTER — Ambulatory Visit: Payer: Medicare Other | Admitting: Physical Therapy

## 2011-10-12 ENCOUNTER — Encounter: Payer: Medicare Other | Admitting: Occupational Therapy

## 2011-10-12 ENCOUNTER — Ambulatory Visit: Payer: Medicare Other | Admitting: Physical Therapy

## 2012-02-05 ENCOUNTER — Encounter: Payer: Self-pay | Admitting: Physical Medicine & Rehabilitation

## 2012-07-27 ENCOUNTER — Emergency Department (HOSPITAL_COMMUNITY)
Admission: EM | Admit: 2012-07-27 | Discharge: 2012-07-27 | Disposition: A | Discharge status: Home / Self Care | Payer: Medicare Other | Attending: Emergency Medicine | Admitting: Emergency Medicine

## 2012-07-27 ENCOUNTER — Encounter (HOSPITAL_COMMUNITY): Payer: Self-pay | Admitting: *Deleted

## 2012-07-27 DIAGNOSIS — Z8673 Personal history of transient ischemic attack (TIA), and cerebral infarction without residual deficits: Secondary | ICD-10-CM | POA: Insufficient documentation

## 2012-07-27 DIAGNOSIS — R339 Retention of urine, unspecified: Secondary | ICD-10-CM | POA: Insufficient documentation

## 2012-07-27 DIAGNOSIS — I10 Essential (primary) hypertension: Secondary | ICD-10-CM | POA: Insufficient documentation

## 2012-07-27 DIAGNOSIS — Z87891 Personal history of nicotine dependence: Secondary | ICD-10-CM | POA: Insufficient documentation

## 2012-07-27 HISTORY — DX: Essential (primary) hypertension: I10

## 2012-07-27 HISTORY — DX: Cerebral infarction, unspecified: I63.9

## 2012-07-27 LAB — CBC WITH DIFFERENTIAL/PLATELET
Basophils Relative: 0 % (ref 0–1)
Eosinophils Absolute: 0 10*3/uL (ref 0.0–0.7)
Eosinophils Relative: 0 % (ref 0–5)
Hemoglobin: 13.4 g/dL (ref 13.0–17.0)
Lymphs Abs: 1.4 10*3/uL (ref 0.7–4.0)
MCH: 29.5 pg (ref 26.0–34.0)
MCHC: 34.1 g/dL (ref 30.0–36.0)
MCV: 86.4 fL (ref 78.0–100.0)
Monocytes Absolute: 0.7 10*3/uL (ref 0.1–1.0)
Monocytes Relative: 5 % (ref 3–12)
RBC: 4.55 MIL/uL (ref 4.22–5.81)

## 2012-07-27 LAB — URINALYSIS, MICROSCOPIC ONLY
Glucose, UA: NEGATIVE mg/dL
Ketones, ur: 15 mg/dL — AB
Leukocytes, UA: NEGATIVE
Protein, ur: NEGATIVE mg/dL
Urobilinogen, UA: 1 mg/dL (ref 0.0–1.0)

## 2012-07-27 LAB — URINALYSIS, ROUTINE W REFLEX MICROSCOPIC
Hgb urine dipstick: NEGATIVE
Nitrite: NEGATIVE
Protein, ur: NEGATIVE mg/dL
Specific Gravity, Urine: 1.021 (ref 1.005–1.030)
Urobilinogen, UA: 1 mg/dL (ref 0.0–1.0)

## 2012-07-27 LAB — BASIC METABOLIC PANEL
BUN: 17 mg/dL (ref 6–23)
Calcium: 10.1 mg/dL (ref 8.4–10.5)
Creatinine, Ser: 1.18 mg/dL (ref 0.50–1.35)
GFR calc non Af Amer: 62 mL/min — ABNORMAL LOW (ref >90)
Glucose, Bld: 159 mg/dL — ABNORMAL HIGH (ref 70–99)

## 2012-07-27 LAB — URINE MICROSCOPIC-ADD ON

## 2012-07-27 NOTE — ED Notes (Addendum)
Pt c/o urinary retention and constipation x 2 days.  Went to EDP today, tested urine for infection and none found.  Pt c/o abdominal cramping.  Abdomen is taught and distended.

## 2012-07-27 NOTE — ED Notes (Signed)
Standard drainage bag converted to leg bag and patient discharged with foley cath in place

## 2012-07-27 NOTE — ED Provider Notes (Signed)
History     CSN: [id number]  Arrival date & time 07/27/12  0046   First MD Initiated Contact with Patient 07/27/12 0303      Chief Complaint  Patient presents with  . Urinary Retention  . Constipation    (Consider location/radiation/quality/duration/timing/severity/associated sxs/prior treatment) HPI Pt presents with 2 days of difficulty urinating and unable to urinate to day with lower abd distention and pain. No fever, chills. Pt states he has had similar symptoms in the past, had to have foley cath placed and has seen urologist. No dysuria or hematuria.  Past Medical History  Diagnosis Date  . Stroke   . Hypertension     History reviewed. No pertinent past surgical history.  History reviewed. No pertinent family history.  History  Substance Use Topics  . Smoking status: Former Games developer  . Smokeless tobacco: Not on file  . Alcohol Use: No      Review of Systems  Constitutional: Negative for fever and chills.  Gastrointestinal: Positive for abdominal pain. Negative for nausea and vomiting.  Genitourinary: Positive for difficulty urinating. Negative for dysuria, frequency, hematuria and flank pain.  Musculoskeletal: Negative for myalgias and back pain.  Skin: Negative for rash and wound.  Neurological: Negative for dizziness, weakness, numbness and headaches.    Allergies  Review of patient's allergies indicates no known allergies.  Home Medications  No current outpatient prescriptions on file.  BP 154/87  Pulse 93  Temp 98.5 F (36.9 C) (Oral)  Resp 14  SpO2 98%  Physical Exam  Nursing note and vitals reviewed. Constitutional: He is oriented to person, place, and time. He appears well-developed and well-nourished. No distress.  HENT:  Head: Normocephalic and atraumatic.  Mouth/Throat: Oropharynx is clear and moist.  Eyes: EOM are normal. Pupils are equal, round, and reactive to light.  Neck: Normal range of motion. Neck supple.  Cardiovascular: Normal  rate and regular rhythm.   Pulmonary/Chest: Effort normal and breath sounds normal. No respiratory distress. He has no wheezes. He has no rales. He exhibits no tenderness.  Abdominal: Soft. Bowel sounds are normal. He exhibits distension. He exhibits no mass. There is tenderness (Suprapubic area distended and tender to palpation). There is no rebound and no guarding.  Musculoskeletal: Normal range of motion. He exhibits no edema and no tenderness.  Neurological: He is alert and oriented to person, place, and time.  Skin: Skin is warm and dry. No rash noted. No erythema.  Psychiatric: He has a normal mood and affect. His behavior is normal.    ED Course  Procedures (including critical care time)  Labs Reviewed  URINALYSIS, ROUTINE W REFLEX MICROSCOPIC - Abnormal; Notable for the following:    Ketones, ur 15 (*)     Leukocytes, UA SMALL (*)     All other components within normal limits  URINALYSIS, MICROSCOPIC ONLY - Abnormal; Notable for the following:    Hgb urine dipstick TRACE (*)     Ketones, ur 15 (*)     All other components within normal limits  CBC WITH DIFFERENTIAL - Abnormal; Notable for the following:    WBC 13.1 (*)     Neutrophils Relative 84 (*)     Neutro Abs 11.0 (*)     Lymphocytes Relative 11 (*)     All other components within normal limits  BASIC METABOLIC PANEL - Abnormal; Notable for the following:    Glucose, Bld 159 (*)     GFR calc non Af Amer 62 (*)  GFR calc Af Amer 71 (*)     All other components within normal limits  URINE MICROSCOPIC-ADD ON - Abnormal; Notable for the following:    Casts HYALINE CASTS (*)     All other components within normal limits   No results found.   1. Urinary retention       MDM   Pt states he is feeling much better. Advised to F/u with urology and return for worsening pain, fever, chills, N/V or any concerns       Loren Racer, MD 07/27/12 [phone]

## 2012-07-27 NOTE — ED Notes (Addendum)
36f foley placed tolerated well. Immediate return of of urine.

## 2012-08-14 ENCOUNTER — Encounter (HOSPITAL_COMMUNITY): Payer: Self-pay | Admitting: *Deleted

## 2012-08-14 ENCOUNTER — Emergency Department (HOSPITAL_COMMUNITY)
Admission: EM | Admit: 2012-08-14 | Discharge: 2012-08-14 | Disposition: A | Discharge status: Home / Self Care | Payer: Medicare Other | Source: Home / Self Care | Attending: Emergency Medicine | Admitting: Emergency Medicine

## 2012-08-14 DIAGNOSIS — Z466 Encounter for fitting and adjustment of urinary device: Secondary | ICD-10-CM | POA: Insufficient documentation

## 2012-08-14 DIAGNOSIS — F1411 Cocaine abuse, in remission: Secondary | ICD-10-CM | POA: Insufficient documentation

## 2012-08-14 DIAGNOSIS — Z79899 Other long term (current) drug therapy: Secondary | ICD-10-CM | POA: Insufficient documentation

## 2012-08-14 DIAGNOSIS — R339 Retention of urine, unspecified: Secondary | ICD-10-CM

## 2012-08-14 DIAGNOSIS — Z8673 Personal history of transient ischemic attack (TIA), and cerebral infarction without residual deficits: Secondary | ICD-10-CM | POA: Insufficient documentation

## 2012-08-14 DIAGNOSIS — Z7982 Long term (current) use of aspirin: Secondary | ICD-10-CM | POA: Insufficient documentation

## 2012-08-14 DIAGNOSIS — S3730XA Unspecified injury of urethra, initial encounter: Secondary | ICD-10-CM

## 2012-08-14 DIAGNOSIS — E785 Hyperlipidemia, unspecified: Secondary | ICD-10-CM | POA: Insufficient documentation

## 2012-08-14 DIAGNOSIS — I429 Cardiomyopathy, unspecified: Secondary | ICD-10-CM | POA: Insufficient documentation

## 2012-08-14 DIAGNOSIS — I10 Essential (primary) hypertension: Secondary | ICD-10-CM | POA: Insufficient documentation

## 2012-08-14 HISTORY — DX: Cocaine abuse, in remission: F14.11

## 2012-08-14 HISTORY — DX: Hyperlipidemia, unspecified: E78.5

## 2012-08-14 HISTORY — DX: Cardiomyopathy, unspecified: I42.9

## 2012-08-14 HISTORY — DX: Retention of urine, unspecified: R33.9

## 2012-08-14 LAB — URINALYSIS, ROUTINE W REFLEX MICROSCOPIC
Glucose, UA: NEGATIVE mg/dL
Protein, ur: >300 mg/dL — AB
Specific Gravity, Urine: 1.016 (ref 1.005–1.030)
pH: 8.5 — ABNORMAL HIGH (ref 5.0–8.0)

## 2012-08-14 LAB — URINE MICROSCOPIC-ADD ON

## 2012-08-14 MED ORDER — HYDROCODONE-ACETAMINOPHEN 5-325 MG PO TABS: 1.0000 | ORAL_TABLET | ORAL | Status: DC | PRN

## 2012-08-14 MED ORDER — LIDOCAINE HCL 2 % EX GEL
Freq: Once | CUTANEOUS | Status: AC
  Administered 2012-08-14: 20 via URETHRAL
  Filled 2012-08-14: qty 20

## 2012-08-14 MED ORDER — HYDROCODONE-ACETAMINOPHEN 5-325 MG PO TABS
2.0000 | ORAL_TABLET | Freq: Once | ORAL | Status: AC
  Administered 2012-08-14: 2 via ORAL
  Filled 2012-08-14: qty 2

## 2012-08-14 MED ORDER — HYDROMORPHONE HCL PF 1 MG/ML IJ SOLN
1.0000 mg | Freq: Once | INTRAMUSCULAR | Status: AC
  Administered 2012-08-14: 1 mg via INTRAMUSCULAR
  Filled 2012-08-14: qty 1

## 2012-08-14 NOTE — ED Provider Notes (Addendum)
History     CSN: [id number]  Arrival date & time 08/14/12  4098   First MD Initiated Contact with Patient 08/14/12 [phone]      Chief Complaint  Patient presents with  . cath came out     (Consider location/radiation/quality/duration/timing/severity/associated sxs/prior treatment) HPI Comments: 66 y/o comes in with cc of catheter related complains. Pt had a foley catheter placed due to urinary retention, and states that he started having some discomfort last night, and tried to pull the catheter out. Pt started having some bleeding from the urethra, and was noted to have part of his foley out, but not all of it at arrival, and in excruciating pain. Pt was seen after the RN pulled out the foley completely, and he states that he still has some pain, thought improved from prior to arrival. He has had couple of episodes of gross hematurias since, no clots coming out.   The history is provided by the patient.    Past Medical History  Diagnosis Date  . Stroke   . Hypertension   . Urinary retention     History reviewed. No pertinent past surgical history.  No family history on file.  History  Substance Use Topics  . Smoking status: Former Games developer  . Smokeless tobacco: Not on file  . Alcohol Use: No      Review of Systems  Constitutional: Negative for activity change and appetite change.  Respiratory: Negative for cough and shortness of breath.   Cardiovascular: Negative for chest pain.  Gastrointestinal: Negative for abdominal pain.  Genitourinary: Positive for dysuria and hematuria. Negative for flank pain.    Allergies  Review of patient's allergies indicates no known allergies.  Home Medications   Current Outpatient Rx  Name Route Sig Dispense Refill  . ASPIRIN 81 MG PO CHEW Oral Chew 81 mg by mouth daily.    Marland Kitchen CARVEDILOL 3.125 MG PO TABS Oral Take 3.125 mg by mouth 2 (two) times daily with a meal.    . CLOPIDOGREL BISULFATE 75 MG PO TABS Oral Take 75 mg by mouth  daily.    Marland Kitchen DOXAZOSIN MESYLATE 4 MG PO TABS Oral Take 4 mg by mouth at bedtime.    Marland Kitchen LEVOTHYROXINE SODIUM 100 MCG PO TABS Oral Take 100 mcg by mouth daily.    Marland Kitchen LISINOPRIL 10 MG PO TABS Oral Take 10 mg by mouth daily.    Marland Kitchen SIMVASTATIN 40 MG PO TABS Oral Take 40 mg by mouth every evening.      BP 141/82  Pulse 129  Temp 98.9 F (37.2 C) (Oral)  Resp 22  SpO2 96%  Physical Exam  Nursing note and vitals reviewed. Constitutional: He is oriented to person, place, and time. He appears well-developed.  HENT:  Head: Normocephalic and atraumatic.  Eyes: Conjunctivae normal and EOM are normal. Pupils are equal, round, and reactive to light.  Neck: Normal range of motion. Neck supple.  Cardiovascular: Normal rate and regular rhythm.   Pulmonary/Chest: Effort normal and breath sounds normal.  Abdominal: Soft. Bowel sounds are normal. He exhibits no distension. There is no tenderness. There is no rebound and no guarding.  Genitourinary:       Pt has bloody per meatus, and a diaper that has some blood on it, no other physical signs of trauma.  Neurological: He is alert and oriented to person, place, and time.  Skin: Skin is warm.    ED Course  Procedures (including critical care time)   Labs Reviewed  URINALYSIS, ROUTINE W REFLEX MICROSCOPIC  URINE CULTURE   No results found.   No diagnosis found.    MDM  Pt comes in with cc of foley catheter complains. He pulled on his foley catheter, partially removing it without deflating the balloon. Suspect urethral injury at this time. Will call Urology to see if a urethrogram, is setting of acute trauma is appropriate for this patient, as it appears that he has been able to urinate already.  Derwood Kaplan, MD 08/14/12 [phone]   Spoke with Dr. Julien Girt. He recommends replacing the foley catheter, no urethrogram needed now, and to have patient see him on Friday, as scheduled. Will give some more pain meds, discharge.  Derwood Kaplan,  MD 08/14/12 [phone]

## 2012-08-14 NOTE — ED Notes (Signed)
Pt had indwelling cath placed last week and he accidentally pulled it half-way out this am.  Pt in pain, dried blood on catheter.

## 2012-08-14 NOTE — ED Notes (Signed)
Foley catheter removed per Dr Rhunette Croft.  5 cc urine colored fluid removed with syringe.  Pt stated immediate relief with removal of catheter.

## 2012-08-14 NOTE — ED Notes (Signed)
Placed leg bag on patient's right lower extremity.

## 2012-08-15 ENCOUNTER — Inpatient Hospital Stay (HOSPITAL_COMMUNITY)
Admission: EM | Admit: 2012-08-15 | Discharge: 2012-08-20 | DRG: 698 | Disposition: A | Discharge status: Home / Self Care | Payer: Medicare Other | Attending: Family Medicine | Admitting: Family Medicine

## 2012-08-15 ENCOUNTER — Encounter (HOSPITAL_COMMUNITY): Payer: Self-pay | Admitting: Family Medicine

## 2012-08-15 ENCOUNTER — Emergency Department (HOSPITAL_COMMUNITY): Payer: Medicare Other

## 2012-08-15 DIAGNOSIS — Z8673 Personal history of transient ischemic attack (TIA), and cerebral infarction without residual deficits: Secondary | ICD-10-CM | POA: Diagnosis present

## 2012-08-15 DIAGNOSIS — Z87891 Personal history of nicotine dependence: Secondary | ICD-10-CM

## 2012-08-15 DIAGNOSIS — I429 Cardiomyopathy, unspecified: Secondary | ICD-10-CM | POA: Diagnosis present

## 2012-08-15 DIAGNOSIS — T83511A Infection and inflammatory reaction due to indwelling urethral catheter, initial encounter: Principal | ICD-10-CM | POA: Diagnosis present

## 2012-08-15 DIAGNOSIS — Y92009 Unspecified place in unspecified non-institutional (private) residence as the place of occurrence of the external cause: Secondary | ICD-10-CM

## 2012-08-15 DIAGNOSIS — F1411 Cocaine abuse, in remission: Secondary | ICD-10-CM | POA: Diagnosis present

## 2012-08-15 DIAGNOSIS — I43 Cardiomyopathy in diseases classified elsewhere: Secondary | ICD-10-CM | POA: Diagnosis present

## 2012-08-15 DIAGNOSIS — E785 Hyperlipidemia, unspecified: Secondary | ICD-10-CM | POA: Diagnosis present

## 2012-08-15 DIAGNOSIS — I639 Cerebral infarction, unspecified: Secondary | ICD-10-CM | POA: Diagnosis present

## 2012-08-15 DIAGNOSIS — N179 Acute kidney failure, unspecified: Secondary | ICD-10-CM | POA: Diagnosis present

## 2012-08-15 DIAGNOSIS — R6510 Systemic inflammatory response syndrome (SIRS) of non-infectious origin without acute organ dysfunction: Secondary | ICD-10-CM | POA: Diagnosis present

## 2012-08-15 DIAGNOSIS — E079 Disorder of thyroid, unspecified: Secondary | ICD-10-CM | POA: Diagnosis present

## 2012-08-15 DIAGNOSIS — D649 Anemia, unspecified: Secondary | ICD-10-CM | POA: Diagnosis present

## 2012-08-15 DIAGNOSIS — Z79899 Other long term (current) drug therapy: Secondary | ICD-10-CM

## 2012-08-15 DIAGNOSIS — Z7982 Long term (current) use of aspirin: Secondary | ICD-10-CM

## 2012-08-15 DIAGNOSIS — Z1639 Resistance to other specified antimicrobial drug: Secondary | ICD-10-CM | POA: Diagnosis present

## 2012-08-15 DIAGNOSIS — A4159 Other Gram-negative sepsis: Secondary | ICD-10-CM | POA: Diagnosis present

## 2012-08-15 DIAGNOSIS — I119 Hypertensive heart disease without heart failure: Secondary | ICD-10-CM | POA: Diagnosis present

## 2012-08-15 DIAGNOSIS — N139 Obstructive and reflux uropathy, unspecified: Secondary | ICD-10-CM | POA: Diagnosis present

## 2012-08-15 DIAGNOSIS — Y846 Urinary catheterization as the cause of abnormal reaction of the patient, or of later complication, without mention of misadventure at the time of the procedure: Secondary | ICD-10-CM | POA: Diagnosis present

## 2012-08-15 DIAGNOSIS — N390 Urinary tract infection, site not specified: Secondary | ICD-10-CM | POA: Diagnosis present

## 2012-08-15 DIAGNOSIS — Z7902 Long term (current) use of antithrombotics/antiplatelets: Secondary | ICD-10-CM

## 2012-08-15 DIAGNOSIS — I10 Essential (primary) hypertension: Secondary | ICD-10-CM | POA: Diagnosis present

## 2012-08-15 DIAGNOSIS — R339 Retention of urine, unspecified: Secondary | ICD-10-CM | POA: Diagnosis present

## 2012-08-15 DIAGNOSIS — A419 Sepsis, unspecified organism: Secondary | ICD-10-CM | POA: Diagnosis present

## 2012-08-15 LAB — BASIC METABOLIC PANEL
BUN: 15 mg/dL (ref 6–23)
CO2: 19 mEq/L (ref 19–32)
Calcium: 9.7 mg/dL (ref 8.4–10.5)
Chloride: 106 mEq/L (ref 96–112)
Creatinine, Ser: 1.63 mg/dL — ABNORMAL HIGH (ref 0.50–1.35)
Glucose, Bld: 213 mg/dL — ABNORMAL HIGH (ref 70–99)

## 2012-08-15 LAB — CBC WITH DIFFERENTIAL/PLATELET
Basophils Absolute: 0 K/uL (ref 0.0–0.1)
Basophils Relative: 0 % (ref 0–1)
Eosinophils Absolute: 0 K/uL (ref 0.0–0.7)
Eosinophils Relative: 0 % (ref 0–5)
HCT: 37.9 % — ABNORMAL LOW (ref 39.0–52.0)
Hemoglobin: 12.5 g/dL — ABNORMAL LOW (ref 13.0–17.0)
Lymphocytes Relative: 12 % (ref 12–46)
Lymphs Abs: 2.3 K/uL (ref 0.7–4.0)
MCH: 28.6 pg (ref 26.0–34.0)
MCHC: 33 g/dL (ref 30.0–36.0)
MCV: 86.7 fL (ref 78.0–100.0)
Monocytes Absolute: 2 K/uL — ABNORMAL HIGH (ref 0.1–1.0)
Monocytes Relative: 11 % (ref 3–12)
Neutro Abs: 14.3 K/uL — ABNORMAL HIGH (ref 1.7–7.7)
Neutrophils Relative %: 77 % (ref 43–77)
Platelets: 182 K/uL (ref 150–400)
RBC: 4.37 MIL/uL (ref 4.22–5.81)
RDW: 13.2 % (ref 11.5–15.5)
WBC: 18.5 K/uL — ABNORMAL HIGH (ref 4.0–10.5)

## 2012-08-15 LAB — LACTIC ACID, PLASMA: Lactic Acid, Venous: 1.8 mmol/L (ref 0.5–2.2)

## 2012-08-15 MED ORDER — ACETAMINOPHEN 325 MG PO TABS
650.0000 mg | ORAL_TABLET | Freq: Once | ORAL | Status: AC
  Administered 2012-08-15: 650 mg via ORAL
  Filled 2012-08-15: qty 2

## 2012-08-15 MED ORDER — CLOPIDOGREL BISULFATE 75 MG PO TABS
75.0000 mg | ORAL_TABLET | Freq: Every day | ORAL | Status: DC
  Administered 2012-08-16 – 2012-08-20 (×4): 75 mg via ORAL
  Filled 2012-08-15 (×4): qty 1

## 2012-08-15 MED ORDER — LEVOTHYROXINE SODIUM 100 MCG PO TABS
100.0000 ug | ORAL_TABLET | Freq: Every day | ORAL | Status: DC
  Administered 2012-08-16 – 2012-08-20 (×5): 100 ug via ORAL
  Filled 2012-08-15 (×5): qty 1

## 2012-08-15 MED ORDER — SIMVASTATIN 40 MG PO TABS
40.0000 mg | ORAL_TABLET | Freq: Every day | ORAL | Status: DC
  Administered 2012-08-15 – 2012-08-19 (×5): 40 mg via ORAL
  Filled 2012-08-15 (×6): qty 1

## 2012-08-15 MED ORDER — DOXAZOSIN MESYLATE 4 MG PO TABS
4.0000 mg | ORAL_TABLET | Freq: Every day | ORAL | Status: DC
  Administered 2012-08-15 – 2012-08-19 (×5): 4 mg via ORAL
  Filled 2012-08-15 (×6): qty 1

## 2012-08-15 MED ORDER — ACETAMINOPHEN 650 MG RE SUPP: 650.0000 mg | Freq: Four times a day (QID) | RECTAL | Status: DC | PRN

## 2012-08-15 MED ORDER — ONDANSETRON HCL 4 MG PO TABS: 4.0000 mg | ORAL_TABLET | Freq: Four times a day (QID) | ORAL | Status: DC | PRN

## 2012-08-15 MED ORDER — SODIUM CHLORIDE 0.9 % IJ SOLN
3.0000 mL | Freq: Two times a day (BID) | INTRAMUSCULAR | Status: DC
  Administered 2012-08-15 – 2012-08-19 (×8): 3 mL via INTRAVENOUS

## 2012-08-15 MED ORDER — DEXTROSE 5 % IV SOLN
1.0000 g | Freq: Once | INTRAVENOUS | Status: AC
  Administered 2012-08-15: 1 g via INTRAVENOUS
  Filled 2012-08-15: qty 10

## 2012-08-15 MED ORDER — SODIUM CHLORIDE 0.9 % IV BOLUS (SEPSIS): 500.0000 mL | Freq: Once | INTRAVENOUS | Status: DC

## 2012-08-15 MED ORDER — LISINOPRIL 10 MG PO TABS
10.0000 mg | ORAL_TABLET | Freq: Every day | ORAL | Status: DC
  Administered 2012-08-16 – 2012-08-20 (×5): 10 mg via ORAL
  Filled 2012-08-15 (×5): qty 1

## 2012-08-15 MED ORDER — ACETAMINOPHEN 325 MG PO TABS
650.0000 mg | ORAL_TABLET | Freq: Four times a day (QID) | ORAL | Status: DC | PRN
  Administered 2012-08-16 – 2012-08-17 (×4): 650 mg via ORAL
  Filled 2012-08-15 (×4): qty 2

## 2012-08-15 MED ORDER — SODIUM CHLORIDE 0.9 % IV BOLUS (SEPSIS)
1000.0000 mL | Freq: Once | INTRAVENOUS | Status: AC
  Administered 2012-08-15: 1000 mL via INTRAVENOUS

## 2012-08-15 MED ORDER — ONDANSETRON HCL 4 MG/2ML IJ SOLN: 4.0000 mg | Freq: Four times a day (QID) | INTRAMUSCULAR | Status: DC | PRN

## 2012-08-15 MED ORDER — HYDROCODONE-ACETAMINOPHEN 5-325 MG PO TABS: 1.0000 | ORAL_TABLET | ORAL | Status: DC | PRN

## 2012-08-15 MED ORDER — DEXTROSE 5 % IV SOLN
1.0000 g | INTRAVENOUS | Status: DC
  Administered 2012-08-16: 1 g via INTRAVENOUS
  Filled 2012-08-15 (×2): qty 10

## 2012-08-15 MED ORDER — CARVEDILOL 3.125 MG PO TABS
3.1250 mg | ORAL_TABLET | Freq: Two times a day (BID) | ORAL | Status: DC
  Administered 2012-08-15 – 2012-08-16 (×2): 3.125 mg via ORAL
  Filled 2012-08-15 (×4): qty 1

## 2012-08-15 MED ORDER — SODIUM CHLORIDE 0.9 % IV SOLN
INTRAVENOUS | Status: DC
  Administered 2012-08-15 – 2012-08-16 (×2): 100 mL/h via INTRAVENOUS
  Administered 2012-08-16 – 2012-08-17 (×2): via INTRAVENOUS

## 2012-08-15 MED ORDER — ASPIRIN 81 MG PO CHEW
81.0000 mg | CHEWABLE_TABLET | Freq: Every day | ORAL | Status: DC
  Administered 2012-08-16 – 2012-08-20 (×4): 81 mg via ORAL
  Filled 2012-08-15 (×4): qty 1

## 2012-08-15 MED ORDER — SODIUM CHLORIDE 0.9 % IV BOLUS (SEPSIS)
500.0000 mL | Freq: Once | INTRAVENOUS | Status: AC
  Administered 2012-08-15: 500 mL via INTRAVENOUS

## 2012-08-15 NOTE — ED Provider Notes (Signed)
History     CSN: [id number]  Arrival date & time 08/15/12  1522   First MD Initiated Contact with Patient 08/15/12 1535      Chief Complaint  Patient presents with  . Altered Mental Status    (Consider location/radiation/quality/duration/timing/severity/associated sxs/prior treatment) Patient is a 66 y.o. male presenting with altered mental status. The history is provided by the patient and the EMS personnel.  Altered Mental Status This is a new problem.   patient presents with fever and altered mental status. Seen yesterday after pulled out his Foley catheter. Urinalysis done that time now shows an infection. He is confused. He is tachycardic.  Past Medical History  Diagnosis Date  . Stroke   . Hypertension   . Urinary retention   . Hyperlipidemia   . Cardiomyopathy   . H/O cocaine abuse   . Thyroid disease     History reviewed. No pertinent past surgical history.  History reviewed. No pertinent family history.  History  Substance Use Topics  . Smoking status: Former Games developer  . Smokeless tobacco: Not on file  . Alcohol Use: No      Review of Systems  Unable to perform ROS: Mental status change  Psychiatric/Behavioral: Positive for altered mental status.    Allergies  Review of patient's allergies indicates no known allergies.  Home Medications   Current Outpatient Rx  Name Route Sig Dispense Refill  . ASPIRIN 81 MG PO CHEW Oral Chew 81 mg by mouth daily.    Marland Kitchen CARVEDILOL 3.125 MG PO TABS Oral Take 3.125 mg by mouth 2 (two) times daily with a meal.    . CLOPIDOGREL BISULFATE 75 MG PO TABS Oral Take 75 mg by mouth daily.    Marland Kitchen DOXAZOSIN MESYLATE 4 MG PO TABS Oral Take 4 mg by mouth at bedtime.    Marland Kitchen HYDROCODONE-ACETAMINOPHEN 5-325 MG PO TABS Oral Take 1 tablet by mouth every 4 (four) hours as needed for pain. 8 tablet 0  . LEVOTHYROXINE SODIUM 100 MCG PO TABS Oral Take 100 mcg by mouth daily.    Marland Kitchen LISINOPRIL 10 MG PO TABS Oral Take 10 mg by mouth daily.    Marland Kitchen  SIMVASTATIN 40 MG PO TABS Oral Take 40 mg by mouth every evening.      BP 139/65  Pulse 130  Temp 102.6 F (39.2 C) (Oral)  Resp 23  SpO2 97%  Physical Exam  Constitutional: He appears well-developed.  HENT:  Head: Normocephalic.  Eyes:       Chronic right eye changes  Neck: Neck supple.  Pulmonary/Chest: Effort normal and breath sounds normal.  Abdominal: Soft. There is no tenderness.  Genitourinary:       Foley catheter in place with leg bag  Musculoskeletal:       No peripheral edema  Neurological:       Patient is awake but somewhat confused.  Skin: Skin is warm.    ED Course  Procedures (including critical care time)  Labs Reviewed  CBC WITH DIFFERENTIAL - Abnormal; Notable for the following:    WBC 18.5 (*)     Hemoglobin 12.5 (*)     HCT 37.9 (*)     Neutro Abs 14.3 (*)     Monocytes Absolute 2.0 (*)     All other components within normal limits  BASIC METABOLIC PANEL - Abnormal; Notable for the following:    Glucose, Bld 213 (*)     Creatinine, Ser 1.63 (*)     GFR calc  non Af Amer 42 (*)     GFR calc Af Amer 48 (*)     All other components within normal limits  TROPONIN I  LACTIC ACID, PLASMA   Dg Chest Port 1 View  08/15/2012  *RADIOLOGY REPORT*  Clinical Data: Fever.  PORTABLE CHEST - 1 VIEW  Comparison: 04/30/2011.  Findings: Poor inspiration with elevated right hemidiaphragm with slightly limited evaluation right lung base.  Taking this limitation into account, no segmental infiltrate is noted.  Heart appears slightly enlarged.  Mild central pulmonary vascular prominence.  No gross pneumothorax.  IMPRESSION: Poor inspiration with elevated right hemidiaphragm with slightly limited evaluation right lung base.  Taking this limitation into account, no segmental infiltrate is noted.  Heart appears slightly enlarged.  Mild central pulmonary vascular prominence.   Original Report Authenticated By: Fuller Canada, M.D.      1. UTI (urinary tract infection)    2. SIRS (systemic inflammatory response syndrome)      Date: 08/15/2012  Rate: 146  Rhythm: sinus tachycardia  QRS Axis: right  Intervals: normal  ST/T Wave abnormalities: nonspecific ST/T changes  Conduction Disutrbances:none  Narrative Interpretation: tachycardia and ST-T changes new  Old EKG Reviewed: changes noted  CRITICAL CARE Performed by: Billee Cashing   Total critical care time: 30  Critical care time was exclusive of separately billable procedures and treating other patients.  Critical care was necessary to treat or prevent imminent or life-threatening deterioration.  Critical care was time spent personally by me on the following activities: development of treatment plan with patient and/or surrogate as well as nursing, discussions with consultants, evaluation of patient's response to treatment, examination of patient, obtaining history from patient or surrogate, ordering and performing treatments and interventions, ordering and review of laboratory studies, ordering and review of radiographic studies, pulse oximetry and re-evaluation of patient's condition.  MDM  Patient with fever and UTI. Urinalysis was done yesterday. Tachycardia, however is normal lactate. Kidney function is increased mildly. Patient's blood pressure is improved with fluids he is required repeated boluses of fluids. Urine culture has been sent yesterday. Patient be admitted to the step down unit.        Juliet Rude. Rubin Payor, MD 08/15/12 1840

## 2012-08-15 NOTE — ED Notes (Signed)
Per pt family pt woke up this am lethargic and unable to walk. Very weak. Pt unable to answer questions at triage. Pt unable to follow commands. Falling out of wheelchair.

## 2012-08-15 NOTE — H&P (Signed)
[facility] History and Physical  Brad Harrell JYN:[id number] DOB: 11/01/1945 DOA: 08/15/2012  Referring physician: Dr. Rubin Payor PCP: Dorrene German, MD  Specialists:  Chief Complaint: Weakness and confusion per family  HPI: Brad Harrell is a 66 y.o. male with past medical history significant for urinary retention with chronic Foley catheter followed by Dr Brunilda Payor, CVA, hypertension cardiomyopathy who presents with above complaints. The history is obtained from ED staff, family and some contribution from the patient. It is reported that patient was seen in the ED yesterday (10/16) after he pulled out his Foley catheter and had some bleeding, a urinalysis was done and it was consistent with a UTI, cultures were sent and patient discharged home but on no antibiotics. Today family brought him back stating that he was very weak this a.m. and unable to get out of his wheelchair which was a change for him, and that he was also confused. In the ED his white cell count was elevated at 18.5, and he was tachycardic to the 140s initially and this improved with hydration to the 120s. His lactic acid level was within normal limits. He was started on antibiotics, and is admitted for further evaluation and management.   Review of Systems: The patient denies anorexia, fever, weight loss,, vision loss, decreased hearing, hoarseness, chest pain, syncope, dyspnea on exertion, peripheral edema, balance deficits, hemoptysis, abdominal pain, melena, hematochezia, severe indigestion/heartburn, transient blindness,  depression, unusual weight change.  Past Medical History  Diagnosis Date  . Stroke   . Hypertension   . Urinary retention   . Hyperlipidemia   . Cardiomyopathy   . H/O cocaine abuse   . Thyroid disease    History reviewed. No pertinent past surgical history. Social History:  reports that he has quit smoking. He does not have any smokeless tobacco history on file. He reports that he does not  drink alcohol or use illicit drugs.  where does patient live-family at home   No Known Allergies  History reviewed. No pertinent family history. unable to obtain secondary to his mental status  Prior to Admission medications   Medication Sig Start Date End Date Taking? Authorizing Provider  aspirin 81 MG chewable tablet Chew 81 mg by mouth daily.   Yes Historical Provider, MD  carvedilol (COREG) 3.125 MG tablet Take 3.125 mg by mouth 2 (two) times daily with a meal.   Yes Historical Provider, MD  clopidogrel (PLAVIX) 75 MG tablet Take 75 mg by mouth daily.   Yes Historical Provider, MD  doxazosin (CARDURA) 4 MG tablet Take 4 mg by mouth at bedtime.   Yes Historical Provider, MD  HYDROcodone-acetaminophen (NORCO/VICODIN) 5-325 MG per tablet Take 1 tablet by mouth every 4 (four) hours as needed for pain. 08/14/12  Yes Derwood Kaplan, MD  levothyroxine (SYNTHROID, LEVOTHROID) 100 MCG tablet Take 100 mcg by mouth daily.   Yes Historical Provider, MD  lisinopril (PRINIVIL,ZESTRIL) 10 MG tablet Take 10 mg by mouth daily.   Yes Historical Provider, MD  simvastatin (ZOCOR) 40 MG tablet Take 40 mg by mouth every evening.   Yes Historical Provider, MD   Physical Exam: Filed Vitals:   08/15/12 1535 08/15/12 1730 08/15/12 1732  BP: 103/62 139/65   Pulse: 137 130   Temp: 102 F (38.9 C)  102.6 F (39.2 C)  TempSrc: Oral  Oral  Resp: 24 23   SpO2: 92% 97%     Constitutional: Vital signs reviewed.  Patient is a well-developed and well-nourished  in no acute  distress and cooperative with exam. Alert and oriented x2.  Head: Normocephalic and atraumatic, left facial weakness Mouth: no erythema or exudates, MMM Eyes: PERRL,conjunctivae normal, No scleral icterus.  Neck: Supple, Trachea midline normal ROM, No JVD, mass, thyromegaly, or carotid bruit present.  Cardiovascular: RRR, S1 normal, S2 normal, no MRG, pulses symmetric and intact bilaterally Pulmonary/Chest: CTAB, no wheezes, rales, or  rhonchi Abdominal: Soft. Non-tender, non-distended, bowel sounds are normal, no masses, organomegaly, or guarding present.  GU: no CVA tenderness Musculoskeletal: No joint deformities, erythema, or stiffness, ROM full and no nontender Hematology: no cervical, inginal, or axillary adenopathy.  Neurological: A&O x2, left facial weakness, right lower extremity strength 3+/ 5, strength in RUE and left side 4-5/5 ,cranial nerve II-XII are grossly intact, sensory intact to light touch bilaterally.  Skin: Warm, dry and intact. No rash.  Psychiatric: Normal mood and affect.   Labs on Admission:  Basic Metabolic Panel:  Lab 08/15/12 1610  NA 139  K 3.7  CL 106  CO2 19  GLUCOSE 213*  BUN 15  CREATININE 1.63*  CALCIUM 9.7  MG --  PHOS --   Liver Function Tests: No results found for this basename: AST:5,ALT:5,ALKPHOS:5,BILITOT:5,PROT:5,ALBUMIN:5 in the last 168 hours No results found for this basename: LIPASE:5,AMYLASE:5 in the last 168 hours No results found for this basename: AMMONIA:5 in the last 168 hours CBC:  Lab 08/15/12 1625  WBC 18.5*  NEUTROABS 14.3*  HGB 12.5*  HCT 37.9*  MCV 86.7  PLT 182   Cardiac Enzymes:  Lab 08/15/12 1625  CKTOTAL --  CKMB --  CKMBINDEX --  TROPONINI <0.30    BNP (last 3 results) No results found for this basename: PROBNP:3 in the last 8760 hours CBG: No results found for this basename: GLUCAP:5 in the last 168 hours  Radiological Exams on Admission: Dg Chest Port 1 View  08/15/2012  *RADIOLOGY REPORT*  Clinical Data: Fever.  PORTABLE CHEST - 1 VIEW  Comparison: 04/30/2011.  Findings: Poor inspiration with elevated right hemidiaphragm with slightly limited evaluation right lung base.  Taking this limitation into account, no segmental infiltrate is noted.  Heart appears slightly enlarged.  Mild central pulmonary vascular prominence.  No gross pneumothorax.  IMPRESSION: Poor inspiration with elevated right hemidiaphragm with slightly limited  evaluation right lung base.  Taking this limitation into account, no segmental infiltrate is noted.  Heart appears slightly enlarged.  Mild central pulmonary vascular prominence.   Original Report Authenticated By: Fuller Canada, M.D.     EKG: Independently reviewed. Sinus tachycardia, no acute ischemic changes.  Assessment/Plan Active Problems: Complicated UTI (urinary tract infection) -Followup on 10/16 cultures, obtain blood cultures -Empiric antibiotics with Rocephin pending cultures.  SIRS (systemic inflammatory response syndrome) -As discussed above patient presenting with fever,tachycardia and leukocytosis -Lactic acid within normal limits but we'll obtain procalcitonin and follow. -Urine and blood cultures as above, empiric antibiotics, monitor overnight instead none unit  ARF (acute renal failure) -Likely secondary to above, and possible component of obstructive uropathy given that he pulled out catheter-subsequently replaced in ED 10/16 -Hydrate, follow recheck andconsider further evaluation as appropriate if not improving. Hypertension -Continue outpatient medications with hold parameters(given #1&2), monitor and further treat accordingly  Urinary retention/Chronic Foley catheter -His followed by Dr. Brunilda Payor, follow and consult as appropriate.  h/oStroke  -Continue outpatient medications  Cardiomyopathy -Monitor fluid status with hydration as above.  Code Status: presumed full Family Communication: Wife by phone, [phone] Disposition Plan: Admit to step down  Time spent: >57mins  Kela Millin [facility] Pager [phone]  If 7PM-7AM, please contact night-coverage [email or web address] Password [email or web address] 08/15/2012, 7:04 PM

## 2012-08-16 DIAGNOSIS — E785 Hyperlipidemia, unspecified: Secondary | ICD-10-CM

## 2012-08-16 LAB — BASIC METABOLIC PANEL
BUN: 15 mg/dL (ref 6–23)
Chloride: 109 mEq/L (ref 96–112)
GFR calc Af Amer: 63 mL/min — ABNORMAL LOW (ref >90)
GFR calc non Af Amer: 54 mL/min — ABNORMAL LOW (ref >90)
Glucose, Bld: 158 mg/dL — ABNORMAL HIGH (ref 70–99)
Potassium: 3.6 mEq/L (ref 3.5–5.1)
Sodium: 139 mEq/L (ref 135–145)

## 2012-08-16 LAB — URINE CULTURE: Colony Count: >=100000

## 2012-08-16 LAB — CBC
HCT: 32.9 % — ABNORMAL LOW (ref 39.0–52.0)
Hemoglobin: 10.8 g/dL — ABNORMAL LOW (ref 13.0–17.0)
MCH: 28.6 pg (ref 26.0–34.0)
MCHC: 32.8 g/dL (ref 30.0–36.0)
RBC: 3.78 MIL/uL — ABNORMAL LOW (ref 4.22–5.81)

## 2012-08-16 MED ORDER — LEVALBUTEROL HCL 1.25 MG/0.5ML IN NEBU
1.2500 mg | INHALATION_SOLUTION | Freq: Four times a day (QID) | RESPIRATORY_TRACT | Status: DC | PRN
  Filled 2012-08-16: qty 0.5

## 2012-08-16 MED ORDER — CARVEDILOL 6.25 MG PO TABS
6.2500 mg | ORAL_TABLET | Freq: Two times a day (BID) | ORAL | Status: DC
  Administered 2012-08-16 – 2012-08-20 (×8): 6.25 mg via ORAL
  Filled 2012-08-16 (×10): qty 1

## 2012-08-16 NOTE — Progress Notes (Signed)
[facility] PROGRESS NOTE  Brad Harrell WJX:[id number] DOB: [birth year] DOA: 08/15/2012 PCP: Dorrene German, MD  Assessment/Plan: Complicated UTI (urinary tract infection)  -> 100,000 colonies of proteus mirabilis with sensitivities pending will continue current antibiotic regimen (IV rocephin) at this juncture. - blood cultures ordered results pending.   SIRS (systemic inflammatory response syndrome)  - As discussed above patient presenting with fever,tachycardia and leukocytosis  - Lactic acid within normal limits, procalcitonin results reviewed.  - Continue current IV antibiotic regimen at this juncture.  ARF (acute renal failure)  - Likely secondary to # 1, and possible component of obstructive uropathy given that he pulled out catheter-subsequently replaced in ED 10/16  - Continue to hydrate patient - Creatinine improved at 1.3 after fluid rehydration  Hypertension  -Continue outpatient medications.   - Plan on increasing coreg today due to uncontrolled HTN with tachycardia  Urinary retention/Chronic Foley catheter  -His followed by Dr. Brunilda Payor at this juncture would plan on post hospital follow up with urologist for further recommendations for his urinary retention.   H/o Stroke  -Continue outpatient medications   Cardiomyopathy  -Continue home regimen at this juncture.  Code Status: presumed full at this juncture Family Communication: no family at bedside Disposition Plan: pending continued improvement   Consultants:  none  Procedures:  none  Antibiotics:  rocephin  HPI/Subjective: No new complaints today.  No acute issues reported overnight by patient.  Objective: Filed Vitals:   08/16/12 0604 08/16/12 0730 08/16/12 1157 08/16/12 1329  BP: 140/83 119/58 171/88   Pulse: 127 96 110   Temp: 101.1 F (38.4 C) 99.8 F (37.7 C) 101.4 F (38.6 C) 100.4 F (38 C)  TempSrc: Oral Oral Axillary Oral  Resp: 20 17 29    Height:      Weight:        SpO2: 96% 96% 96%     Intake/Output Summary (Last 24 hours) at 08/16/12 1458 Last data filed at 08/16/12 0600  Gross per 24 hour  Intake    803 ml  Output    201 ml  Net    602 ml   Filed Weights   08/15/12 2029  Weight: 87.7 kg (193 lb 5.5 oz)    Exam:   General:  Pt in NAD, A and Awake  Cardiovascular: RRR, No MRG  Respiratory: CTA BL, no wheezes  Abdomen: suprapubic discomfort, non distended  Data Reviewed: Basic Metabolic Panel:  Lab 08/16/12 2130 08/15/12 1625  NA 139 139  K 3.6 3.7  CL 109 106  CO2 19 19  GLUCOSE 158* 213*  BUN 15 15  CREATININE 1.31 1.63*  CALCIUM 8.7 9.7  MG -- --  PHOS -- --   Liver Function Tests: No results found for this basename: AST:5,ALT:5,ALKPHOS:5,BILITOT:5,PROT:5,ALBUMIN:5 in the last 168 hours No results found for this basename: LIPASE:5,AMYLASE:5 in the last 168 hours No results found for this basename: AMMONIA:5 in the last 168 hours CBC:  Lab 08/16/12 0419 08/15/12 1625  WBC 14.4* 18.5*  NEUTROABS -- 14.3*  HGB 10.8* 12.5*  HCT 32.9* 37.9*  MCV 87.0 86.7  PLT 132* 182   Cardiac Enzymes:  Lab 08/15/12 1625  CKTOTAL --  CKMB --  CKMBINDEX --  TROPONINI <0.30   BNP (last 3 results) No results found for this basename: PROBNP:3 in the last 8760 hours CBG: No results found for this basename: GLUCAP:5 in the last 168 hours  Recent Results (from the past 240 hour(s))  URINE CULTURE  Status: Normal (Preliminary result)   Collection Time   08/14/12  8:18 AM      Component Value Range Status Comment   Specimen Description URINE, RANDOM   Final    Special Requests NONE   Final    Culture  Setup Time 08/14/2012 08:37   Final    Colony Count >=100,000 COLONIES/ML   Final    Culture PROTEUS MIRABILIS   Final    Report Status PENDING   Incomplete   MRSA PCR SCREENING     Status: Normal   Collection Time   08/15/12  8:11 PM      Component Value Range Status Comment   MRSA by PCR NEGATIVE  NEGATIVE Final       Studies: Dg Chest Port 1 View  08/15/2012  *RADIOLOGY REPORT*  Clinical Data: Fever.  PORTABLE CHEST - 1 VIEW  Comparison: 04/30/2011.  Findings: Poor inspiration with elevated right hemidiaphragm with slightly limited evaluation right lung base.  Taking this limitation into account, no segmental infiltrate is noted.  Heart appears slightly enlarged.  Mild central pulmonary vascular prominence.  No gross pneumothorax.  IMPRESSION: Poor inspiration with elevated right hemidiaphragm with slightly limited evaluation right lung base.  Taking this limitation into account, no segmental infiltrate is noted.  Heart appears slightly enlarged.  Mild central pulmonary vascular prominence.   Original Report Authenticated By: Fuller Canada, M.D.     Scheduled Meds:   . acetaminophen  650 mg Oral Once  . aspirin  81 mg Oral Daily  . carvedilol  3.125 mg Oral BID WC  . cefTRIAXone (ROCEPHIN)  IV  1 g Intravenous Once  . cefTRIAXone (ROCEPHIN)  IV  1 g Intravenous Q24H  . clopidogrel  75 mg Oral Daily  . doxazosin  4 mg Oral QHS  . levothyroxine  100 mcg Oral Daily  . lisinopril  10 mg Oral Daily  . simvastatin  40 mg Oral QHS  . sodium chloride  1,000 mL Intravenous Once  . sodium chloride  500 mL Intravenous Once  . sodium chloride  3 mL Intravenous Q12H  . DISCONTD: sodium chloride  500 mL Intravenous Once   Continuous Infusions:   . sodium chloride 100 mL/hr (08/16/12 0950)    Active Problems:  Cardiomyopathy  Hypertension  Urinary retention  Stroke  Complicated UTI (urinary tract infection)  SIRS (systemic inflammatory response syndrome)  ARF (acute renal failure)    Time spent: > 35 minutes    Penny Pia  [facility] Pager [phone]. If 8PM-8AM, please contact night-coverage at [email or web address], password [facility] 08/16/2012, 2:58 PM  LOS: 1 day

## 2012-08-16 NOTE — Clinical Documentation Improvement (Signed)
SEPSIS DOCUMENTATION QUERY  THIS DOCUMENT IS NOT A PERMANENT PART OF THE MEDICAL RECORD  TO RESPOND TO THE THIS QUERY, FOLLOW THE INSTRUCTIONS BELOW:  1. If needed, update documentation for the patient's encounter via the notes activity.  2. Access this query again and click edit on the In [employer].  3. After updating, or not, click F2 to complete all highlighted (required) fields concerning your review. Select "additional documentation in the medical record" OR "no additional documentation provided".  4. Click Sign note button.  5. The deficiency will fall out of your In Basket *Please let us know if you are not able to complete this workflow by phone or e-mail (listed below).  Please update your documentation within the medical record to reflect your response to this query.                                                                                    08/16/12  Dear Dr. [facility],  In a better effort to capture your patient's severity of illness, reflect appropriate length of stay and utilization of resources, a review of the patient medical record has revealed the following indicators.    Based on your clinical judgment, please clarify and document in a progress note and/or discharge summary the clinical condition associated with the following supporting information:  In responding to this query please exercise your independent judgment.  The fact that a query is asked, does not imply that any particular answer is desired or expected.   Possible Clinical Conditions  Septicemia / Sepsis  Sepsis with UTI  Other Condition   Cannot clinically Determine    Presenting Signs and Symptoms:  "Tachycardic to the 140s initially and this improved with hydration to the 120s" per notes "Very weak this a.m. and unable to get out of his wheelchair" per notes  "Complicated UTI" per notes  "Patient presenting with fever,tachycardia and leukocytosis" per notes.  Fever up to 102.6 "AMS" per notes Acute Renal Failure  Diagnostics: Results for SAHMIR, WEATHERBEE (MRN [id number]) as of 08/16/2012 10:29  Ref. Range 07/27/2012 03:51 08/15/2012 16:25 08/16/2012 04:19  WBC Latest Range: 4.0-10.5 K/uL 13.1 (H) 18.5 (H) 14.4 (H)   Results for ZAXTON, ANGERER (MRN [id number]) as of 08/16/2012 10:29  Ref. Range 08/15/2012 17:05 08/15/2012 21:05  Lactic Acid, Venous Latest Range: 0.5-2.2 mmol/L 1.8   Procalcitonin No range found  2.54   Cultures: Component Value Specimen Description URINE, RANDOM Special Requests NONE  Culture Setup Time 08/14/2012 08:37  Colony Count >=100,000 COLONIES/ML Culture PROTEUS MIRABILIS  Report Status PENDING  Treatment: Continuous Cardiac Monitoring Monitoring of labs, I&O's, physical assessments Received a total of (NS boluses); Continuous NS@100ml /hr Rocephin 1gm IVPB every 24hrs   Reviewed: Query was not answered  Thank You,     Rossie Muskrat RN, BSN  Clinical Documentation Specialist Pager:  [phone]  [email or web address]  Health Information Management [place]

## 2012-08-17 DIAGNOSIS — R339 Retention of urine, unspecified: Secondary | ICD-10-CM

## 2012-08-17 LAB — PROCALCITONIN: Procalcitonin: 1.87 ng/mL

## 2012-08-17 MED ORDER — CIPROFLOXACIN IN D5W 400 MG/200ML IV SOLN
400.0000 mg | Freq: Two times a day (BID) | INTRAVENOUS | Status: DC
  Administered 2012-08-17 – 2012-08-20 (×6): 400 mg via INTRAVENOUS
  Filled 2012-08-17 (×8): qty 200

## 2012-08-17 NOTE — Progress Notes (Signed)
ANTIBIOTIC CONSULT NOTE - INITIAL  Pharmacy Consult for cipro Indication: UTI  No Known Allergies  Patient Measurements: Height: 5\' 5"  (165.1 cm) Weight: 200 lb 2.8 oz (90.8 kg) IBW/kg (Calculated) : 61.5   Vital Signs: Temp: 100.1 F (37.8 C) (10/19 1200) Temp src: Axillary (10/19 1200) BP: 132/76 mmHg (10/19 1200) Pulse Rate: 96  (10/19 1200) Intake/Output from previous day: 10/18 0701 - 10/19 0700 In: 1440 [P.O.:240; I.V.:1200] Out: 1200 [Urine:1200] Intake/Output from this shift: Total I/O In: 1340 [P.O.:840; I.V.:500] Out: 700 [Urine:700]  Labs:  [facility] 08/16/12 0419 08/15/12 1625  WBC 14.4* 18.5*  HGB 10.8* 12.5*  PLT 132* 182  LABCREA -- --  CREATININE 1.31 1.63*   Estimated Creatinine Clearance: 55.9 ml/min (by C-G formula based on Cr of 1.31). No results found for this basename: VANCOTROUGH:2,VANCOPEAK:2,VANCORANDOM:2,GENTTROUGH:2,GENTPEAK:2,GENTRANDOM:2,TOBRATROUGH:2,TOBRAPEAK:2,TOBRARND:2,AMIKACINPEAK:2,AMIKACINTROU:2,AMIKACIN:2, in the last 72 hours   Microbiology: Recent Results (from the past 720 hour(s))  URINE CULTURE     Status: Normal   Collection Time   08/14/12  8:18 AM      Component Value Range Status Comment   Specimen Description URINE, RANDOM   Final    Special Requests NONE   Final    Culture  Setup Time 08/14/2012 08:37   Final    Colony Count >=100,000 COLONIES/ML   Final    Culture PROTEUS MIRABILIS   Final    Report Status 08/16/2012 FINAL   Final    Organism ID, Bacteria PROTEUS MIRABILIS   Final   MRSA PCR SCREENING     Status: Normal   Collection Time   08/15/12  8:11 PM      Component Value Range Status Comment   MRSA by PCR NEGATIVE  NEGATIVE Final   CULTURE, BLOOD (ROUTINE X 2)     Status: Normal (Preliminary result)   Collection Time   08/15/12  9:10 PM      Component Value Range Status Comment   Specimen Description BLOOD LEFT HAND   Final    Special Requests BOTTLES DRAWN AEROBIC ONLY 4CC   Final    Culture  Setup  Time 08/16/2012 02:05   Final    Culture     Final    Value:        BLOOD CULTURE RECEIVED NO GROWTH TO DATE CULTURE WILL BE HELD FOR 5 DAYS BEFORE ISSUING A FINAL NEGATIVE REPORT   Report Status PENDING   Incomplete     Medical History: Past Medical History  Diagnosis Date  . Stroke   . Hypertension   . Urinary retention   . Hyperlipidemia   . Cardiomyopathy   . H/O cocaine abuse   . Thyroid disease     Medications:  Prescriptions prior to admission  Medication Sig Dispense Refill  . aspirin 81 MG chewable tablet Chew 81 mg by mouth daily.      . carvedilol (COREG) 3.125 MG tablet Take 3.125 mg by mouth 2 (two) times daily with a meal.      . clopidogrel (PLAVIX) 75 MG tablet Take 75 mg by mouth daily.      Marland Kitchen doxazosin (CARDURA) 4 MG tablet Take 4 mg by mouth at bedtime.      Marland Kitchen HYDROcodone-acetaminophen (NORCO/VICODIN) 5-325 MG per tablet Take 1 tablet by mouth every 4 (four) hours as needed for pain.  8 tablet  0  . levothyroxine (SYNTHROID, LEVOTHROID) 100 MCG tablet Take 100 mcg by mouth daily.      Marland Kitchen lisinopril (PRINIVIL,ZESTRIL) 10 MG tablet Take 10  mg by mouth daily.      . simvastatin (ZOCOR) 40 MG tablet Take 40 mg by mouth every evening.       Assessment: 66 yo M with complicated UTI with culture revealing >100,000 colonies proteus mirabilis, S to cipro.  Patient with new ARF, likely prerenal, SCr improved 1.63>1.31 s/p fluid.  Current CrCl ~55 ml/min.  Goal of Therapy:  Eradication of infection  Plan:  - Cipro 400 mg IV q12h - Follow up SCr, UOP, cultures, clinical course and adjust as clinically indicated.  Dae Antonucci L. Illene Bolus, PharmD, BCPS Clinical Pharmacist Pager: [phone] Pharmacy: [phone] 08/17/2012 1:35 PM

## 2012-08-17 NOTE — Progress Notes (Signed)
[facility] PROGRESS NOTE  Brad Harrell NGE:[id number] DOB: [birth year] DOA: 08/15/2012 PCP: Dorrene German, MD  Assessment/Plan: Complicated UTI (urinary tract infection)  -> 100,000 colonies of proteus mirabilis with sensitivities resulted and sensitive to cipro (was resistant to nitrofurantoin) - blood cultures ordered results pending. - Change IV antibiotic to cipro 10/19   SIRS (systemic inflammatory response syndrome)  - As discussed above patient presenting with fever,tachycardia and leukocytosis  - Lactic acid within normal limits, procalcitonin results reviewed.  - Continue current IV antibiotic regimen at this juncture.  ARF (acute renal failure)  - Likely secondary to # 1, and possible component of obstructive uropathy given that he pulled out catheter-subsequently replaced in ED 10/16  - Continue to hydrate patient - Creatinine improved at 1.3 after fluid rehydration - Resolved and likely due to prerenal causes 2ary to decreased po intake from # 1 and 2  Hypertension  -Continue outpatient medications.   - Plan on increasing coreg today due to uncontrolled HTN with tachycardia  Urinary retention/Chronic Foley catheter  -His followed by Dr. Brunilda Payor at this juncture would plan on post hospital follow up with urologist for further recommendations for his urinary retention.   H/o Stroke  -Continue outpatient medications   Cardiomyopathy  -Continue home regimen at this juncture.  Code Status: presumed full at this juncture Family Communication: no family at bedside Disposition Plan: pending continued improvement   Consultants:  none  Procedures:  none  Antibiotics:  rocephin  HPI/Subjective: No new complaints today.  No acute issues reported overnight by patient. Patient reports feeling better.  Objective: Filed Vitals:   08/17/12 0700 08/17/12 0800 08/17/12 0900 08/17/12 1000  BP: 126/60 141/70 139/65 148/71  Pulse: 91 96 95 97  Temp:  99.6 F  (37.6 C)    TempSrc:  Tympanic    Resp: 16 18 19 19   Height:      Weight:      SpO2: 98% 100% 100% 98%    Intake/Output Summary (Last 24 hours) at 08/17/12 1226 Last data filed at 08/17/12 1000  Gross per 24 hour  Intake   2220 ml  Output   1450 ml  Net    770 ml   Filed Weights   08/15/12 2029 08/17/12 0536  Weight: 87.7 kg (193 lb 5.5 oz) 90.8 kg (200 lb 2.8 oz)    Exam:   General:  Pt in NAD, A and Awake  Cardiovascular: RRR, No MRG  Respiratory: CTA BL, no wheezes  Abdomen: suprapubic discomfort, non distended  Data Reviewed: Basic Metabolic Panel:  Lab 08/16/12 4010 08/15/12 1625  NA 139 139  K 3.6 3.7  CL 109 106  CO2 19 19  GLUCOSE 158* 213*  BUN 15 15  CREATININE 1.31 1.63*  CALCIUM 8.7 9.7  MG -- --  PHOS -- --   Liver Function Tests: No results found for this basename: AST:5,ALT:5,ALKPHOS:5,BILITOT:5,PROT:5,ALBUMIN:5 in the last 168 hours No results found for this basename: LIPASE:5,AMYLASE:5 in the last 168 hours No results found for this basename: AMMONIA:5 in the last 168 hours CBC:  Lab 08/16/12 0419 08/15/12 1625  WBC 14.4* 18.5*  NEUTROABS -- 14.3*  HGB 10.8* 12.5*  HCT 32.9* 37.9*  MCV 87.0 86.7  PLT 132* 182   Cardiac Enzymes:  Lab 08/15/12 1625  CKTOTAL --  CKMB --  CKMBINDEX --  TROPONINI <0.30   BNP (last 3 results) No results found for this basename: PROBNP:3 in the last 8760 hours CBG: No results found for  this basename: GLUCAP:5 in the last 168 hours  Recent Results (from the past 240 hour(s))  URINE CULTURE     Status: Normal   Collection Time   08/14/12  8:18 AM      Component Value Range Status Comment   Specimen Description URINE, RANDOM   Final    Special Requests NONE   Final    Culture  Setup Time 08/14/2012 08:37   Final    Colony Count >=100,000 COLONIES/ML   Final    Culture PROTEUS MIRABILIS   Final    Report Status 08/16/2012 FINAL   Final    Organism ID, Bacteria PROTEUS MIRABILIS   Final   MRSA  PCR SCREENING     Status: Normal   Collection Time   08/15/12  8:11 PM      Component Value Range Status Comment   MRSA by PCR NEGATIVE  NEGATIVE Final      Studies: Dg Chest Port 1 View  08/15/2012  *RADIOLOGY REPORT*  Clinical Data: Fever.  PORTABLE CHEST - 1 VIEW  Comparison: 04/30/2011.  Findings: Poor inspiration with elevated right hemidiaphragm with slightly limited evaluation right lung base.  Taking this limitation into account, no segmental infiltrate is noted.  Heart appears slightly enlarged.  Mild central pulmonary vascular prominence.  No gross pneumothorax.  IMPRESSION: Poor inspiration with elevated right hemidiaphragm with slightly limited evaluation right lung base.  Taking this limitation into account, no segmental infiltrate is noted.  Heart appears slightly enlarged.  Mild central pulmonary vascular prominence.   Original Report Authenticated By: Fuller Canada, M.D.     Scheduled Meds:    . aspirin  81 mg Oral Daily  . carvedilol  6.25 mg Oral BID WC  . cefTRIAXone (ROCEPHIN)  IV  1 g Intravenous Q24H  . clopidogrel  75 mg Oral Daily  . doxazosin  4 mg Oral QHS  . levothyroxine  100 mcg Oral Daily  . lisinopril  10 mg Oral Daily  . simvastatin  40 mg Oral QHS  . sodium chloride  3 mL Intravenous Q12H  . DISCONTD: carvedilol  3.125 mg Oral BID WC   Continuous Infusions:    . sodium chloride 100 mL/hr at 08/17/12 8295    Active Problems:  Cardiomyopathy  Hypertension  Urinary retention  Stroke  Complicated UTI (urinary tract infection)  SIRS (systemic inflammatory response syndrome)  ARF (acute renal failure)    Time spent: > 35 minutes    Penny Pia  [facility] Pager [phone]. If 8PM-8AM, please contact night-coverage at [email or web address], password [facility] 08/17/2012, 12:26 PM  LOS: 2 days

## 2012-08-18 LAB — CBC
HCT: 27.4 % — ABNORMAL LOW (ref 39.0–52.0)
Hemoglobin: 9.2 g/dL — ABNORMAL LOW (ref 13.0–17.0)
MCV: 83.3 fL (ref 78.0–100.0)
RDW: 13.3 % (ref 11.5–15.5)
WBC: 9 10*3/uL (ref 4.0–10.5)

## 2012-08-18 NOTE — Progress Notes (Signed)
[facility] PROGRESS NOTE  Brad Harrell JYN:[id number] DOB: 23-Sep-1946 DOA: 08/15/2012 PCP: Dorrene German, MD  Assessment/Plan: Complicated UTI (urinary tract infection)  -> 100,000 colonies of proteus mirabilis with sensitivities resulted and sensitive to cipro (was resistant to nitrofurantoin) - blood cultures ordered results pending. - Change IV antibiotic to cipro 10/19 - WBC trending down but patient febrile within the last 24 hr.  Will continue to monitor.   SIRS (systemic inflammatory response syndrome)  - As discussed above patient presenting with fever,tachycardia and leukocytosis  - Lactic acid within normal limits, procalcitonin results reviewed.  - Continue current IV antibiotic regimen at this juncture. - Resolving on current IV antibiotic regimen.  ARF (acute renal failure)  - Likely secondary to # 1, and possible component of obstructive uropathy given that he pulled out catheter-subsequently replaced in ED 10/16  - Continue to hydrate patient - Creatinine improved at 1.3 after fluid rehydration - Resolved and likely due to prerenal causes 2ary to decreased po intake from # 1 and 2  Hypertension  -Continue outpatient medications.   - Plan on increasing coreg today due to uncontrolled HTN with tachycardia  Urinary retention/Chronic Foley catheter  -His followed by Dr. Brunilda Payor at this juncture would plan on post hospital follow up with urologist for further recommendations for his urinary retention.   H/o Stroke  -Continue outpatient medications   Cardiomyopathy  -Continue home regimen at this juncture.  Code Status: presumed full at this juncture Family Communication: no family at bedside Disposition Plan: pending continued improvement   Consultants:  none  Procedures:  none  Antibiotics:  rocephin  HPI/Subjective: No new complaints today.  No acute issues reported overnight by patient. Patient reports feeling better.  Objective: Filed  Vitals:   08/17/12 1744 08/17/12 2040 08/17/12 2100 08/18/12 0500  BP: 130/68 136/70 146/79 152/70  Pulse: 82 70 94 85  Temp:   100.8 F (38.2 C) 99.8 F (37.7 C)  TempSrc:      Resp:   18 16  Height:      Weight:      SpO2:   97% 97%    Intake/Output Summary (Last 24 hours) at 08/18/12 1247 Last data filed at 08/18/12 0800  Gross per 24 hour  Intake    360 ml  Output   1525 ml  Net  -1165 ml   Filed Weights   08/15/12 2029 08/17/12 0536  Weight: 87.7 kg (193 lb 5.5 oz) 90.8 kg (200 lb 2.8 oz)    Exam:   General:  Pt in NAD, A and Awake  Cardiovascular: RRR, No MRG  Respiratory: CTA BL, no wheezes  Abdomen: suprapubic discomfort, non distended  Data Reviewed: Basic Metabolic Panel:  Lab 08/16/12 8657 08/15/12 1625  NA 139 139  K 3.6 3.7  CL 109 106  CO2 19 19  GLUCOSE 158* 213*  BUN 15 15  CREATININE 1.31 1.63*  CALCIUM 8.7 9.7  MG -- --  PHOS -- --   Liver Function Tests: No results found for this basename: AST:5,ALT:5,ALKPHOS:5,BILITOT:5,PROT:5,ALBUMIN:5 in the last 168 hours No results found for this basename: LIPASE:5,AMYLASE:5 in the last 168 hours No results found for this basename: AMMONIA:5 in the last 168 hours CBC:  Lab 08/18/12 0532 08/16/12 0419 08/15/12 1625  WBC 9.0 14.4* 18.5*  NEUTROABS -- -- 14.3*  HGB 9.2* 10.8* 12.5*  HCT 27.4* 32.9* 37.9*  MCV 83.3 87.0 86.7  PLT 141* 132* 182   Cardiac Enzymes:  Lab 08/15/12 1625  CKTOTAL --  CKMB --  CKMBINDEX --  TROPONINI <0.30   BNP (last 3 results) No results found for this basename: PROBNP:3 in the last 8760 hours CBG: No results found for this basename: GLUCAP:5 in the last 168 hours  Recent Results (from the past 240 hour(s))  URINE CULTURE     Status: Normal   Collection Time   08/14/12  8:18 AM      Component Value Range Status Comment   Specimen Description URINE, RANDOM   Final    Special Requests NONE   Final    Culture  Setup Time 08/14/2012 08:37   Final     Colony Count >=100,000 COLONIES/ML   Final    Culture PROTEUS MIRABILIS   Final    Report Status 08/16/2012 FINAL   Final    Organism ID, Bacteria PROTEUS MIRABILIS   Final   MRSA PCR SCREENING     Status: Normal   Collection Time   08/15/12  8:11 PM      Component Value Range Status Comment   MRSA by PCR NEGATIVE  NEGATIVE Final   CULTURE, BLOOD (ROUTINE X 2)     Status: Normal (Preliminary result)   Collection Time   08/15/12  9:10 PM      Component Value Range Status Comment   Specimen Description BLOOD LEFT HAND   Final    Special Requests BOTTLES DRAWN AEROBIC ONLY 4CC   Final    Culture  Setup Time 08/16/2012 02:05   Final    Culture     Final    Value:        BLOOD CULTURE RECEIVED NO GROWTH TO DATE CULTURE WILL BE HELD FOR 5 DAYS BEFORE ISSUING A FINAL NEGATIVE REPORT   Report Status PENDING   Incomplete      Studies: No results found.  Scheduled Meds:    . aspirin  81 mg Oral Daily  . carvedilol  6.25 mg Oral BID WC  . ciprofloxacin  400 mg Intravenous Q12H  . clopidogrel  75 mg Oral Daily  . doxazosin  4 mg Oral QHS  . levothyroxine  100 mcg Oral Daily  . lisinopril  10 mg Oral Daily  . simvastatin  40 mg Oral QHS  . sodium chloride  3 mL Intravenous Q12H   Continuous Infusions:    Active Problems:  Cardiomyopathy  Hypertension  Urinary retention  Stroke  Complicated UTI (urinary tract infection)  SIRS (systemic inflammatory response syndrome)  ARF (acute renal failure)    Time spent: > 35 minutes    Penny Pia  [facility] Pager [phone]. If 8PM-8AM, please contact night-coverage at [email or web address], password [facility] 08/18/2012, 12:47 PM  LOS: 3 days

## 2012-08-19 NOTE — Progress Notes (Signed)
[facility] PROGRESS NOTE  Brad Harrell:[id number] DOB: 17-Jul-1946 DOA: 08/15/2012 PCP: Dorrene German, MD  Assessment/Plan: Complicated UTI (urinary tract infection)  -> 100,000 colonies of proteus mirabilis with sensitivities resulted and sensitive to cipro (was resistant to nitrofurantoin) - blood cultures negative to date - Change IV antibiotic to cipro 10/19 and WBC has been trending down on this regimen. - Will plan on treating for a total of 12 days for complicated UTI today is day # 5 - Still spiking fevers but fever curve trending down.   SIRS (systemic inflammatory response syndrome)  - As discussed above patient presenting with fever,tachycardia and leukocytosis  - Lactic acid within normal limits, procalcitonin results reviewed.  - Continue current IV antibiotic regimen at this juncture. Will like transition to oral antibiotic tomorrow. - Resolved with IV antibiotics and supportive therapy.  ARF (acute renal failure)  - Likely secondary to # 1, and possible component of obstructive uropathy given that he pulled out catheter-subsequently replaced in ED 10/16  - Creatinine improved at 1.3 after fluid rehydration - Resolved and likely due to prerenal causes 2ary to decreased po intake from # 1 and 2  Hypertension  -Continue outpatient medications.   - Improved blood pressure control with recent increase of B blocker  Urinary retention/Chronic Foley catheter  -His followed by Dr. Brunilda Payor at this juncture would plan on post hospital follow up with urologist for further recommendations for his urinary retention.   H/o Stroke  -Continue outpatient medications   Cardiomyopathy  -Continue home regimen at this juncture.  Code Status: presumed full at this juncture Family Communication: no family at bedside Disposition Plan: pending continued improvement   Consultants:  none  Procedures:  none  Antibiotics:  rocephin  HPI/Subjective: No new  complaints today.  No acute issues reported overnight by patient. Is worried about being able to afford his medication but his daughter reports that they would be able to provide the money for any medication that the patient would need.  Objective: Filed Vitals:   08/18/12 2101 08/18/12 2115 08/19/12 0500 08/19/12 1400  BP: 136/68 135/74 143/83 135/82  Pulse: 82 85 64 83  Temp:  100.5 F (38.1 C) 98.3 F (36.8 C) 98.8 F (37.1 C)  TempSrc:    Oral  Resp:  16 16 16   Height:      Weight:      SpO2:  98% 97% 97%    Intake/Output Summary (Last 24 hours) at 08/19/12 1643 Last data filed at 08/19/12 0600  Gross per 24 hour  Intake    680 ml  Output    675 ml  Net      5 ml   Filed Weights   08/15/12 2029 08/17/12 0536  Weight: 87.7 kg (193 lb 5.5 oz) 90.8 kg (200 lb 2.8 oz)    Exam:   General:  Pt in NAD, A and Awake  Cardiovascular: RRR, No MRG  Respiratory: CTA BL, no wheezes  Abdomen: nontender, non distended  Data Reviewed: Basic Metabolic Panel:  Lab 08/16/12 8119 08/15/12 1625  NA 139 139  K 3.6 3.7  CL 109 106  CO2 19 19  GLUCOSE 158* 213*  BUN 15 15  CREATININE 1.31 1.63*  CALCIUM 8.7 9.7  MG -- --  PHOS -- --   Liver Function Tests: No results found for this basename: AST:5,ALT:5,ALKPHOS:5,BILITOT:5,PROT:5,ALBUMIN:5 in the last 168 hours No results found for this basename: LIPASE:5,AMYLASE:5 in the last 168 hours No results found for this  basename: AMMONIA:5 in the last 168 hours CBC:  Lab 08/18/12 0532 08/16/12 0419 08/15/12 1625  WBC 9.0 14.4* 18.5*  NEUTROABS -- -- 14.3*  HGB 9.2* 10.8* 12.5*  HCT 27.4* 32.9* 37.9*  MCV 83.3 87.0 86.7  PLT 141* 132* 182   Cardiac Enzymes:  Lab 08/15/12 1625  CKTOTAL --  CKMB --  CKMBINDEX --  TROPONINI <0.30   BNP (last 3 results) No results found for this basename: PROBNP:3 in the last 8760 hours CBG: No results found for this basename: GLUCAP:5 in the last 168 hours  Recent Results (from the  past 240 hour(s))  URINE CULTURE     Status: Normal   Collection Time   08/14/12  8:18 AM      Component Value Range Status Comment   Specimen Description URINE, RANDOM   Final    Special Requests NONE   Final    Culture  Setup Time 08/14/2012 08:37   Final    Colony Count >=100,000 COLONIES/ML   Final    Culture PROTEUS MIRABILIS   Final    Report Status 08/16/2012 FINAL   Final    Organism ID, Bacteria PROTEUS MIRABILIS   Final   MRSA PCR SCREENING     Status: Normal   Collection Time   08/15/12  8:11 PM      Component Value Range Status Comment   MRSA by PCR NEGATIVE  NEGATIVE Final   CULTURE, BLOOD (ROUTINE X 2)     Status: Normal (Preliminary result)   Collection Time   08/15/12  9:10 PM      Component Value Range Status Comment   Specimen Description BLOOD LEFT HAND   Final    Special Requests BOTTLES DRAWN AEROBIC ONLY 4CC   Final    Culture  Setup Time 08/16/2012 02:05   Final    Culture     Final    Value:        BLOOD CULTURE RECEIVED NO GROWTH TO DATE CULTURE WILL BE HELD FOR 5 DAYS BEFORE ISSUING A FINAL NEGATIVE REPORT   Report Status PENDING   Incomplete      Studies: No results found.  Scheduled Meds:    . aspirin  81 mg Oral Daily  . carvedilol  6.25 mg Oral BID WC  . ciprofloxacin  400 mg Intravenous Q12H  . clopidogrel  75 mg Oral Daily  . doxazosin  4 mg Oral QHS  . levothyroxine  100 mcg Oral Daily  . lisinopril  10 mg Oral Daily  . simvastatin  40 mg Oral QHS  . sodium chloride  3 mL Intravenous Q12H   Continuous Infusions:    Active Problems:  Cardiomyopathy  Hypertension  Urinary retention  Stroke  Complicated UTI (urinary tract infection)  SIRS (systemic inflammatory response syndrome)  ARF (acute renal failure)    Time spent: > 35 minutes    Penny Pia  [facility] Pager [phone]. If 8PM-8AM, please contact night-coverage at [email or web address], password [facility] 08/19/2012, 4:43 PM  LOS: 4 days

## 2012-08-20 DIAGNOSIS — I428 Other cardiomyopathies: Secondary | ICD-10-CM

## 2012-08-20 MED ORDER — CIPROFLOXACIN HCL 500 MG PO TABS
500.0000 mg | ORAL_TABLET | Freq: Two times a day (BID) | ORAL | Status: DC
  Administered 2012-08-20: 500 mg via ORAL
  Filled 2012-08-20 (×2): qty 1

## 2012-08-20 MED ORDER — CARVEDILOL 6.25 MG PO TABS: 6.2500 mg | ORAL_TABLET | Freq: Two times a day (BID) | ORAL | Status: DC

## 2012-08-20 MED ORDER — CIPROFLOXACIN HCL 500 MG PO TABS: 500.0000 mg | ORAL_TABLET | Freq: Two times a day (BID) | ORAL | Status: DC

## 2012-08-20 NOTE — Progress Notes (Signed)
ANTIBIOTIC CONSULT NOTE - Follow up  Pharmacy Consult for cipro Indication: UTI  No Known Allergies  Patient Measurements: Height: 5\' 5"  (165.1 cm) Weight: 200 lb 2.8 oz (90.8 kg) IBW/kg (Calculated) : 61.5   Vital Signs: Temp: 98.6 F (37 C) (10/22 1610) Temp src: Oral (10/22 0613) BP: 131/74 mmHg (10/22 0613) Pulse Rate: 83  (10/22 0613) Intake/Output from previous day:   Intake/Output from this shift: Total I/O In: 440 [P.O.:440] Out: -   Labs:  Basename 08/18/12 0532  WBC 9.0  HGB 9.2*  PLT 141*  LABCREA --  CREATININE --   Estimated Creatinine Clearance: 55.9 ml/min (by C-G formula based on Cr of 1.31). No results found for this basename: VANCOTROUGH:2,VANCOPEAK:2,VANCORANDOM:2,GENTTROUGH:2,GENTPEAK:2,GENTRANDOM:2,TOBRATROUGH:2,TOBRAPEAK:2,TOBRARND:2,AMIKACINPEAK:2,AMIKACINTROU:2,AMIKACIN:2, in the last 72 hours   Microbiology: Recent Results (from the past 720 hour(s))  URINE CULTURE     Status: Normal   Collection Time   08/14/12  8:18 AM      Component Value Range Status Comment   Specimen Description URINE, RANDOM   Final    Special Requests NONE   Final    Culture  Setup Time 08/14/2012 08:37   Final    Colony Count >=100,000 COLONIES/ML   Final    Culture PROTEUS MIRABILIS   Final    Report Status 08/16/2012 FINAL   Final    Organism ID, Bacteria PROTEUS MIRABILIS   Final   MRSA PCR SCREENING     Status: Normal   Collection Time   08/15/12  8:11 PM      Component Value Range Status Comment   MRSA by PCR NEGATIVE  NEGATIVE Final   CULTURE, BLOOD (ROUTINE X 2)     Status: Normal (Preliminary result)   Collection Time   08/15/12  9:10 PM      Component Value Range Status Comment   Specimen Description BLOOD LEFT HAND   Final    Special Requests BOTTLES DRAWN AEROBIC ONLY 4CC   Final    Culture  Setup Time 08/16/2012 02:05   Final    Culture     Final    Value:        BLOOD CULTURE RECEIVED NO GROWTH TO DATE CULTURE WILL BE HELD FOR 5 DAYS BEFORE  ISSUING A FINAL NEGATIVE REPORT   Report Status PENDING   Incomplete    Assessment: 66 yo male with complicated UTI with culture revealing >100,000 colonies proteus mirabilis, sensitive to cipro.  On Cipro Day #4, total antibiotic day #6. Patient with new ARF, likely prerenal, SCr improved 1.63>1.31 s/p fluid; CrCl ~55 ml/min. Pt has been afebrile (T<100.5 F) for >24 hours, has been taking oral meds; per MD note yesterday, anticipated switching to oral antibiotic today. Noted plans to treat for total of 12 days.   Goal of Therapy:  Eradication of infection  Plan:  - Change to Cipro 500 mg PO q12h - Follow up SCr, UOP, cultures, clinical course and adjust as clinically indicated.   Nicolasa Ducking, PharmD Clinical Pharmacist Pgr [phone] 08/20/2012 2:06 PM

## 2012-08-20 NOTE — Discharge Summary (Addendum)
Physician Discharge Summary  Brad Harrell WJX:[id number] DOB: Feb 19, 1946 DOA: 08/15/2012  PCP: Dorrene German, MD  Admit date: 08/15/2012 Discharge date: 08/20/2012  Time spent: > 35 minutes  Recommendations for Outpatient Follow-up:  1. Please be sure to follow up with urologist. Dr. Julien Girt to further evaluate you on 10/28 2. Patient recently had addendum: sepsis secondary to urinary source. On evaluation please make further recommendations regarding foley and whether or not to continue antibiotic therapy 3. Also be sure to follow up with Anemia 4. Consider routine colonoscopy if patient has not already had evaluation  Discharge Diagnoses:  Active Problems:  Cardiomyopathy  Hypertension  Urinary retention  Stroke  Complicated UTI (urinary tract infection)  SIRS (systemic inflammatory response syndrome)  ARF (acute renal failure)   Discharge Condition: Stable  Diet recommendation: Cardiac  Filed Weights   08/15/12 2029 08/17/12 0536  Weight: 87.7 kg (193 lb 5.5 oz) 90.8 kg (200 lb 2.8 oz)    History of present illness:  From original HPI: Brad Harrell is a 66 y.o. male with past medical history significant for urinary retention with chronic Foley catheter followed by Dr Brunilda Payor, CVA, hypertension cardiomyopathy who presents with above complaints. The history is obtained from ED staff, family and some contribution from the patient. It is reported that patient was seen in the ED yesterday (10/16) after he pulled out his Foley catheter and had some bleeding, a urinalysis was done and it was consistent with a UTI, cultures were sent and patient discharged home but on no antibiotics. Today family brought him back stating that he was very weak this a.m. and unable to get out of his wheelchair which was a change for him, and that he was also confused. In the ED his white cell count was elevated at 18.5, and he was tachycardic to the 140s initially and this improved with hydration to  the 120s. His lactic acid level was within normal limits. He was started on antibiotics, and is admitted for further evaluation and management.  Hospital Course:  Complicated UTI (urinary tract infection)  -> 100,000 colonies of proteus mirabilis with sensitivities resulted and sensitive to cipro (was resistant to nitrofurantoin)  - blood cultures negative to date  - Change IV antibiotic to cipro 10/19 and WBC has been trending down on this regimen.  - Will plan on treating for a total of 14 days for complicated UTI today is day # 6  - Fevers resolved  Addendum Sepsis (pt initially met sirs criteria with source of infection) - As discussed above patient presenting with fever,tachycardia and leukocytosis  - Lactic acid within normal limits, procalcitonin results reviewed.  - Continue current IV antibiotic regimen at this juncture. Will like transition to oral antibiotic tomorrow.  - Resolved with IV antibiotics and supportive therapy.   ARF (acute renal failure)  - Likely secondary to # 1, and possible component of obstructive uropathy given that he pulled out catheter-subsequently replaced in ED 10/16  - Creatinine improved at 1.3 after fluid rehydration  - Resolved and likely due to prerenal causes 2ary to decreased po intake from # 1 and 2  - On discharge will hold lisinopril.  Hypertension  -Continue B blocker at increased dose.  Hold lisinopril 2ary to recent history of acute renal failure which has resolved but will recommend that patient follow up with PCP to decide whether to continue this regimen or not pending creatinine and renal function as outpatient.   Urinary retention/Chronic Foley catheter  -His  followed by Dr. Brunilda Payor at this juncture would plan on post hospital follow up with urologist for further recommendations for his urinary retention.   H/o Stroke  -Continue outpatient medications   Cardiomyopathy  -Continue home regimen at this juncture and will hold lisinopril  as indicated above.  Anemia - Patient received IVF's and suspect that he was hemoconsentrated initially.   - No active bleeding - Recommend patient follow up with PCP for further evaluation as outpatient.  Should consider routine colonoscopy if patient has not already had one.  No BRBPR, melena, or abdominal discomfort reported  Procedures:  none  Consultations:  none  Discharge Exam: Filed Vitals:   08/19/12 1400 08/19/12 2100 08/20/12 0613 08/20/12 1400  BP: 135/82 143/87 131/74 141/76  Pulse: 83 82 83 93  Temp: 98.8 F (37.1 C) 98.3 F (36.8 C) 98.6 F (37 C) 98.8 F (37.1 C)  TempSrc: Oral Oral Oral Oral  Resp: 16 18 16 20   Height:      Weight:      SpO2: 97% 98% 98% 98%    General: Pt in NAD, Alert and Awake Cardiovascular: RRR, No Gallops Respiratory: CTA BL, no wheezes  Discharge Instructions: Also follow up with PCP for routine screening and consideration of colonoscopy if you have not recently had one  Discharge Orders    Future Orders Please Complete By Expires   Diet - low sodium heart healthy      Increase activity slowly      Discharge instructions      Comments:   Please be sure to follow up with your Urologist in 1-2 weeks.  Also please be sure to take your antibiotics as recommended. 08/26/12 1015 AM   Call MD for:  temperature >100.4      Call MD for:  persistant nausea and vomiting      Call MD for:  extreme fatigue      Call MD for:  redness, tenderness, or signs of infection (pain, swelling, redness, odor or green/yellow discharge around incision site)          Medication List     As of 08/20/2012  3:23 PM    STOP taking these medications         lisinopril 10 MG tablet   Commonly known as: PRINIVIL,ZESTRIL      TAKE these medications         aspirin 81 MG chewable tablet   Chew 81 mg by mouth daily.      carvedilol 6.25 MG tablet   Commonly known as: COREG   Take 1 tablet (6.25 mg total) by mouth 2 (two) times daily with a  meal.      ciprofloxacin 500 MG tablet   Commonly known as: CIPRO   Take 1 tablet (500 mg total) by mouth 2 (two) times daily.      clopidogrel 75 MG tablet   Commonly known as: PLAVIX   Take 75 mg by mouth daily.      doxazosin 4 MG tablet   Commonly known as: CARDURA   Take 4 mg by mouth at bedtime.      HYDROcodone-acetaminophen 5-325 MG per tablet   Commonly known as: NORCO/VICODIN   Take 1 tablet by mouth every 4 (four) hours as needed for pain.      levothyroxine 100 MCG tablet   Commonly known as: SYNTHROID, LEVOTHROID   Take 100 mcg by mouth daily.      simvastatin 40 MG tablet  Commonly known as: ZOCOR   Take 40 mg by mouth every evening.          The results of significant diagnostics from this hospitalization (including imaging, microbiology, ancillary and laboratory) are listed below for reference.    Significant Diagnostic Studies: Dg Chest Port 1 View  08/15/2012  *RADIOLOGY REPORT*  Clinical Data: Fever.  PORTABLE CHEST - 1 VIEW  Comparison: 04/30/2011.  Findings: Poor inspiration with elevated right hemidiaphragm with slightly limited evaluation right lung base.  Taking this limitation into account, no segmental infiltrate is noted.  Heart appears slightly enlarged.  Mild central pulmonary vascular prominence.  No gross pneumothorax.  IMPRESSION: Poor inspiration with elevated right hemidiaphragm with slightly limited evaluation right lung base.  Taking this limitation into account, no segmental infiltrate is noted.  Heart appears slightly enlarged.  Mild central pulmonary vascular prominence.   Original Report Authenticated By: Fuller Canada, M.D.     Microbiology: Recent Results (from the past 240 hour(s))  URINE CULTURE     Status: Normal   Collection Time   08/14/12  8:18 AM      Component Value Range Status Comment   Specimen Description URINE, RANDOM   Final    Special Requests NONE   Final    Culture  Setup Time 08/14/2012 08:37   Final     Colony Count >=100,000 COLONIES/ML   Final    Culture PROTEUS MIRABILIS   Final    Report Status 08/16/2012 FINAL   Final    Organism ID, Bacteria PROTEUS MIRABILIS   Final   MRSA PCR SCREENING     Status: Normal   Collection Time   08/15/12  8:11 PM      Component Value Range Status Comment   MRSA by PCR NEGATIVE  NEGATIVE Final   CULTURE, BLOOD (ROUTINE X 2)     Status: Normal (Preliminary result)   Collection Time   08/15/12  9:10 PM      Component Value Range Status Comment   Specimen Description BLOOD LEFT HAND   Final    Special Requests BOTTLES DRAWN AEROBIC ONLY 4CC   Final    Culture  Setup Time 08/16/2012 02:05   Final    Culture     Final    Value:        BLOOD CULTURE RECEIVED NO GROWTH TO DATE CULTURE WILL BE HELD FOR 5 DAYS BEFORE ISSUING A FINAL NEGATIVE REPORT   Report Status PENDING   Incomplete      Labs: Basic Metabolic Panel:  Lab 08/16/12 1610 08/15/12 1625  NA 139 139  K 3.6 3.7  CL 109 106  CO2 19 19  GLUCOSE 158* 213*  BUN 15 15  CREATININE 1.31 1.63*  CALCIUM 8.7 9.7  MG -- --  PHOS -- --   Liver Function Tests: No results found for this basename: AST:5,ALT:5,ALKPHOS:5,BILITOT:5,PROT:5,ALBUMIN:5 in the last 168 hours No results found for this basename: LIPASE:5,AMYLASE:5 in the last 168 hours No results found for this basename: AMMONIA:5 in the last 168 hours CBC:  Lab 08/18/12 0532 08/16/12 0419 08/15/12 1625  WBC 9.0 14.4* 18.5*  NEUTROABS -- -- 14.3*  HGB 9.2* 10.8* 12.5*  HCT 27.4* 32.9* 37.9*  MCV 83.3 87.0 86.7  PLT 141* 132* 182   Cardiac Enzymes:  Lab 08/15/12 1625  CKTOTAL --  CKMB --  CKMBINDEX --  TROPONINI <0.30   BNP: BNP (last 3 results) No results found for this basename: PROBNP:3 in the last  8760 hours CBG: No results found for this basename: GLUCAP:5 in the last 168 hours     Signed:  Penny Pia  [facility] 08/20/2012, 3:23 PM

## 2012-08-20 NOTE — Plan of Care (Signed)
Problem: Phase I Progression Outcomes Goal: Voiding-avoid urinary catheter unless indicated Pt has chronic foley catheter for urinary retention.

## 2012-08-20 NOTE — Evaluation (Signed)
Physical Therapy Evaluation Patient Details Name: Brad Harrell MRN: [id number] DOB: [birth year] Today's Date: 08/20/2012 Time: 4098-1191 PT Time Calculation (min): 19 min  PT Assessment / Plan / Recommendation Clinical Impression  pt presents with UTI and SIRS.  pt seems to be near baseline level of function per pt and sister.  pt has all needed DME at home and notes daughter provides 24hr care.  Will follow while on acute, but no needs at D/C.      PT Assessment  Patient needs continued PT services    Follow Up Recommendations  No PT follow up;Supervision/Assistance - 24 hour    Does the patient have the potential to tolerate intense rehabilitation      Barriers to Discharge None      Equipment Recommendations  None recommended by PT    Recommendations for Other Services     Frequency Min 3X/week    Precautions / Restrictions Precautions Precautions: Fall Required Braces or Orthoses: Other Brace/Splint Other Brace/Splint: pt has L AFO, but notes he only wears it sometimes Restrictions Weight Bearing Restrictions: No   Pertinent Vitals/Pain Denies pain.      Mobility  Bed Mobility Bed Mobility: Supine to Sit;Sitting - Scoot to Edge of Bed;Sit to Supine Supine to Sit: 5: Supervision;With rails Sitting - Scoot to Edge of Bed: 5: Supervision Sit to Supine: 4: Min assist Details for Bed Mobility Assistance: Increased time needed to sit up without A.  pt needed A with LEs to return to bed.   Transfers Transfers: Sit to Stand;Stand to Sit Sit to Stand: 4: Min guard;With upper extremity assist;From bed Stand to Sit: 4: Min guard;With upper extremity assist;To bed Details for Transfer Assistance: cues to get closer to bed prior to sitting and to slow down.  pt tends to throw his bottom back towards bed.   Ambulation/Gait Ambulation/Gait Assistance: 4: Min guard Ambulation Distance (Feet): 140 Feet Assistive device: Rolling walker Ambulation/Gait Assistance Details:  cues for negotiating around obstacles and to slow down.  pt with L hemiparetic gait and notes he has an AFO for L LE, but only wears it sometimes.  L foot with decreased dorsiflexion and tends to slide along floor.   Gait Pattern: Step-through pattern;Decreased step length - left;Decreased dorsiflexion - left Stairs: No Wheelchair Mobility Wheelchair Mobility: No    Shoulder Instructions     Exercises     PT Diagnosis: Difficulty walking  PT Problem List: Decreased strength;Decreased range of motion;Decreased activity tolerance;Decreased balance;Decreased mobility;Decreased knowledge of use of DME PT Treatment Interventions: DME instruction;Gait training;Stair training;Functional mobility training;Therapeutic activities;Therapeutic exercise;Balance training;Patient/family education   PT Goals Acute Rehab PT Goals PT Goal Formulation: With patient Time For Goal Achievement: 09/03/12 Potential to Achieve Goals: Good Pt will go Supine/Side to Sit: with modified independence PT Goal: Supine/Side to Sit - Progress: Goal set today Pt will go Sit to Supine/Side: with modified independence PT Goal: Sit to Supine/Side - Progress: Goal set today Pt will go Sit to Stand: with modified independence PT Goal: Sit to Stand - Progress: Goal set today Pt will go Stand to Sit: with modified independence PT Goal: Stand to Sit - Progress: Goal set today Pt will Ambulate: >150 feet;with supervision;with rolling walker PT Goal: Ambulate - Progress: Goal set today Pt will Go Up / Down Stairs: 3-5 stairs;with min assist;with rail(s) PT Goal: Up/Down Stairs - Progress: Goal set today  Visit Information  Last PT Received On: 08/20/12 Assistance Needed: +1    Subjective Data  Subjective: This is my wife and my girlfriend.  -pt jokes instead of giving straight answers.   Patient Stated Goal: Home   Prior Functioning  Home Living Lives With: Spouse;Daughter Available Help at Discharge: Family;Available  24 hours/day Type of Home: House Home Access: Stairs to enter [employer] of Steps: 2-3 Entrance Stairs-Rails: Right;Left Home Layout: One level Bathroom Shower/Tub: Forensic scientist: Standard Bathroom Accessibility: Yes How Accessible: Accessible via walker Home Adaptive Equipment: Bedside commode/3-in-1;Hand-held shower hose;Tub transfer bench;Walker - rolling;Wheelchair - manual Prior Function Level of Independence: Needs assistance Needs Assistance: Bathing;Meal Prep;Light Housekeeping Bath: Moderate Meal Prep: Total Light Housekeeping: Total Able to Take Stairs?: Yes Driving: No Vocation: Retired Musician: No difficulties    Cognition  Overall Cognitive Status: Appears within functional limits for tasks assessed/performed Arousal/Alertness: Awake/alert Orientation Level: Appears intact for tasks assessed Behavior During Session: [facility] for tasks performed    Extremity/Trunk Assessment Right Lower Extremity Assessment RLE ROM/Strength/Tone: WFL for tasks assessed RLE Sensation: WFL - Light Touch Left Lower Extremity Assessment LLE ROM/Strength/Tone: Deficits LLE ROM/Strength/Tone Deficits: Old CVA with decreased ankle/foot AROM, knee/hip grossly 3+/5 LLE Sensation: Deficits LLE Sensation Deficits: Diminished to soft touch Trunk Assessment Trunk Assessment: Kyphotic   Balance Balance Balance Assessed: Yes Static Standing Balance Static Standing - Balance Support: Bilateral upper extremity supported Static Standing - Level of Assistance: 5: Stand by assistance Static Standing - Comment/# of Minutes: With Bil UE support pt can be S, but without UEs needs MinG-MinA.    End of Session PT - End of Session Equipment Utilized During Treatment: Gait belt Activity Tolerance: Patient tolerated treatment well Patient left: in bed;with call bell/phone within reach;with family/visitor present Nurse Communication: Mobility status   GP     Sunny Schlein, [place] [phone] 08/20/2012, 12:05 PM

## 2012-08-22 LAB — CULTURE, BLOOD (ROUTINE X 2)

## 2013-03-04 ENCOUNTER — Emergency Department (HOSPITAL_COMMUNITY): Payer: Medicare Other

## 2013-03-04 ENCOUNTER — Inpatient Hospital Stay (HOSPITAL_COMMUNITY): Payer: Medicare Other

## 2013-03-04 ENCOUNTER — Encounter (HOSPITAL_COMMUNITY): Payer: Self-pay | Admitting: Nurse Practitioner

## 2013-03-04 ENCOUNTER — Inpatient Hospital Stay (HOSPITAL_COMMUNITY)
Admission: EM | Admit: 2013-03-04 | Discharge: 2013-03-12 | DRG: 441 | Payer: Medicare Other | Attending: Internal Medicine | Admitting: Internal Medicine

## 2013-03-04 DIAGNOSIS — I639 Cerebral infarction, unspecified: Secondary | ICD-10-CM | POA: Diagnosis present

## 2013-03-04 DIAGNOSIS — I69998 Other sequelae following unspecified cerebrovascular disease: Secondary | ICD-10-CM

## 2013-03-04 DIAGNOSIS — Z22322 Carrier or suspected carrier of Methicillin resistant Staphylococcus aureus: Secondary | ICD-10-CM

## 2013-03-04 DIAGNOSIS — B957 Other staphylococcus as the cause of diseases classified elsewhere: Secondary | ICD-10-CM | POA: Diagnosis present

## 2013-03-04 DIAGNOSIS — D509 Iron deficiency anemia, unspecified: Secondary | ICD-10-CM | POA: Diagnosis present

## 2013-03-04 DIAGNOSIS — I69959 Hemiplegia and hemiparesis following unspecified cerebrovascular disease affecting unspecified side: Secondary | ICD-10-CM

## 2013-03-04 DIAGNOSIS — K31819 Angiodysplasia of stomach and duodenum without bleeding: Secondary | ICD-10-CM | POA: Diagnosis present

## 2013-03-04 DIAGNOSIS — E785 Hyperlipidemia, unspecified: Secondary | ICD-10-CM | POA: Diagnosis present

## 2013-03-04 DIAGNOSIS — I429 Cardiomyopathy, unspecified: Secondary | ICD-10-CM | POA: Diagnosis present

## 2013-03-04 DIAGNOSIS — F1411 Cocaine abuse, in remission: Secondary | ICD-10-CM | POA: Diagnosis present

## 2013-03-04 DIAGNOSIS — Z6831 Body mass index (BMI) 31.0-31.9, adult: Secondary | ICD-10-CM

## 2013-03-04 DIAGNOSIS — I10 Essential (primary) hypertension: Secondary | ICD-10-CM | POA: Diagnosis present

## 2013-03-04 DIAGNOSIS — R6510 Systemic inflammatory response syndrome (SIRS) of non-infectious origin without acute organ dysfunction: Secondary | ICD-10-CM

## 2013-03-04 DIAGNOSIS — I428 Other cardiomyopathies: Secondary | ICD-10-CM | POA: Diagnosis present

## 2013-03-04 DIAGNOSIS — F1410 Cocaine abuse, uncomplicated: Secondary | ICD-10-CM | POA: Diagnosis present

## 2013-03-04 DIAGNOSIS — R630 Anorexia: Secondary | ICD-10-CM | POA: Diagnosis present

## 2013-03-04 DIAGNOSIS — K2960 Other gastritis without bleeding: Secondary | ICD-10-CM | POA: Diagnosis present

## 2013-03-04 DIAGNOSIS — E039 Hypothyroidism, unspecified: Secondary | ICD-10-CM | POA: Diagnosis present

## 2013-03-04 DIAGNOSIS — N179 Acute kidney failure, unspecified: Secondary | ICD-10-CM

## 2013-03-04 DIAGNOSIS — R5381 Other malaise: Secondary | ICD-10-CM | POA: Diagnosis present

## 2013-03-04 DIAGNOSIS — K750 Abscess of liver: Secondary | ICD-10-CM | POA: Diagnosis present

## 2013-03-04 DIAGNOSIS — Z87891 Personal history of nicotine dependence: Secondary | ICD-10-CM

## 2013-03-04 DIAGNOSIS — R338 Other retention of urine: Secondary | ICD-10-CM | POA: Diagnosis present

## 2013-03-04 DIAGNOSIS — D649 Anemia, unspecified: Secondary | ICD-10-CM | POA: Diagnosis present

## 2013-03-04 DIAGNOSIS — G9341 Metabolic encephalopathy: Secondary | ICD-10-CM | POA: Diagnosis present

## 2013-03-04 DIAGNOSIS — R109 Unspecified abdominal pain: Secondary | ICD-10-CM

## 2013-03-04 DIAGNOSIS — Z8673 Personal history of transient ischemic attack (TIA), and cerebral infarction without residual deficits: Secondary | ICD-10-CM | POA: Diagnosis present

## 2013-03-04 DIAGNOSIS — I69992 Facial weakness following unspecified cerebrovascular disease: Secondary | ICD-10-CM

## 2013-03-04 DIAGNOSIS — K5900 Constipation, unspecified: Secondary | ICD-10-CM | POA: Diagnosis present

## 2013-03-04 DIAGNOSIS — R7881 Bacteremia: Secondary | ICD-10-CM | POA: Diagnosis present

## 2013-03-04 HISTORY — DX: Anemia, unspecified: D64.9

## 2013-03-04 HISTORY — DX: Abscess of liver: K75.0

## 2013-03-04 HISTORY — DX: Hypothyroidism, unspecified: E03.9

## 2013-03-04 LAB — POCT I-STAT, CHEM 8
Calcium, Ion: 1.14 mmol/L (ref 1.13–1.30)
Creatinine, Ser: 1.2 mg/dL (ref 0.50–1.35)
Glucose, Bld: 144 mg/dL — ABNORMAL HIGH (ref 70–99)
HCT: 25 % — ABNORMAL LOW (ref 39.0–52.0)
Hemoglobin: 8.5 g/dL — ABNORMAL LOW (ref 13.0–17.0)

## 2013-03-04 LAB — MAGNESIUM: Magnesium: 2.5 mg/dL (ref 1.5–2.5)

## 2013-03-04 LAB — URINE MICROSCOPIC-ADD ON

## 2013-03-04 LAB — CBC
MCH: 25.5 pg — ABNORMAL LOW (ref 26.0–34.0)
MCV: 79.6 fL (ref 78.0–100.0)
MCV: 79.2 fL (ref 78.0–100.0)
Platelets: 323 10*3/uL (ref 150–400)
Platelets: 316 10*3/uL (ref 150–400)
RDW: 15.5 % (ref 11.5–15.5)
RDW: 15.5 % (ref 11.5–15.5)
WBC: 13.8 10*3/uL — ABNORMAL HIGH (ref 4.0–10.5)
WBC: 12.6 10*3/uL — ABNORMAL HIGH (ref 4.0–10.5)

## 2013-03-04 LAB — URINALYSIS, ROUTINE W REFLEX MICROSCOPIC
Ketones, ur: 15 mg/dL — AB
Nitrite: NEGATIVE
Urobilinogen, UA: 1 mg/dL (ref 0.0–1.0)

## 2013-03-04 LAB — COMPREHENSIVE METABOLIC PANEL
ALT: 23 U/L (ref 0–53)
AST: 34 U/L (ref 0–37)
Albumin: 2.7 g/dL — ABNORMAL LOW (ref 3.5–5.2)
Calcium: 9.7 mg/dL (ref 8.4–10.5)
GFR calc Af Amer: 69 mL/min — ABNORMAL LOW (ref >90)
Sodium: 135 mEq/L (ref 135–145)
Total Protein: 9.1 g/dL — ABNORMAL HIGH (ref 6.0–8.3)

## 2013-03-04 LAB — DIFFERENTIAL
Basophils Absolute: 0 10*3/uL (ref 0.0–0.1)
Eosinophils Absolute: 0.1 10*3/uL (ref 0.0–0.7)
Eosinophils Relative: 0 % (ref 0–5)

## 2013-03-04 LAB — PHOSPHORUS: Phosphorus: 4.1 mg/dL (ref 2.3–4.6)

## 2013-03-04 LAB — PROTIME-INR
INR: 1.23 (ref 0.00–1.49)
Prothrombin Time: 15.3 seconds — ABNORMAL HIGH (ref 11.6–15.2)

## 2013-03-04 LAB — AMMONIA: Ammonia: 23 umol/L (ref 11–60)

## 2013-03-04 MED ORDER — GADOBENATE DIMEGLUMINE 529 MG/ML IV SOLN
19.0000 mL | Freq: Once | INTRAVENOUS | Status: AC
  Administered 2013-03-04: 19 mL via INTRAVENOUS

## 2013-03-04 MED ORDER — IOHEXOL 300 MG/ML  SOLN
100.0000 mL | Freq: Once | INTRAMUSCULAR | Status: AC | PRN
  Administered 2013-03-04: 100 mL via INTRAVENOUS

## 2013-03-04 MED ORDER — SENNA 8.6 MG PO TABS
1.0000 | ORAL_TABLET | Freq: Two times a day (BID) | ORAL | Status: DC
  Administered 2013-03-04 – 2013-03-12 (×12): 8.6 mg via ORAL
  Filled 2013-03-04 (×17): qty 1

## 2013-03-04 MED ORDER — ONDANSETRON HCL 4 MG/2ML IJ SOLN: 4.0000 mg | Freq: Four times a day (QID) | INTRAMUSCULAR | Status: DC | PRN

## 2013-03-04 MED ORDER — IOHEXOL 300 MG/ML  SOLN
25.0000 mL | INTRAMUSCULAR | Status: AC
  Administered 2013-03-04 (×2): 25 mL via ORAL

## 2013-03-04 MED ORDER — IMIPENEM-CILASTATIN 500 MG IV SOLR: 500.0000 mg | Freq: Once | INTRAVENOUS | Status: DC

## 2013-03-04 MED ORDER — SODIUM CHLORIDE 0.9 % IJ SOLN
3.0000 mL | Freq: Two times a day (BID) | INTRAMUSCULAR | Status: DC
  Administered 2013-03-06 – 2013-03-11 (×4): 3 mL via INTRAVENOUS

## 2013-03-04 MED ORDER — SODIUM CHLORIDE 0.9 % IV SOLN: INTRAVENOUS | Status: AC

## 2013-03-04 MED ORDER — ACETAMINOPHEN 650 MG RE SUPP: 650.0000 mg | Freq: Four times a day (QID) | RECTAL | Status: DC | PRN

## 2013-03-04 MED ORDER — METRONIDAZOLE IN NACL 5-0.79 MG/ML-% IV SOLN
500.0000 mg | Freq: Three times a day (TID) | INTRAVENOUS | Status: DC
  Administered 2013-03-04 – 2013-03-06 (×5): 500 mg via INTRAVENOUS
  Filled 2013-03-04 (×6): qty 100

## 2013-03-04 MED ORDER — LEVALBUTEROL HCL 0.63 MG/3ML IN NEBU
0.6300 mg | INHALATION_SOLUTION | Freq: Four times a day (QID) | RESPIRATORY_TRACT | Status: DC
  Administered 2013-03-04: 0.63 mg via RESPIRATORY_TRACT
  Filled 2013-03-04 (×4): qty 3

## 2013-03-04 MED ORDER — MORPHINE SULFATE 2 MG/ML IJ SOLN
1.0000 mg | Freq: Three times a day (TID) | INTRAMUSCULAR | Status: DC | PRN
  Administered 2013-03-05 – 2013-03-12 (×5): 1 mg via INTRAVENOUS
  Filled 2013-03-04 (×5): qty 1

## 2013-03-04 MED ORDER — SODIUM CHLORIDE 0.9 % IV SOLN
500.0000 mg | Freq: Three times a day (TID) | INTRAVENOUS | Status: DC
  Administered 2013-03-04 – 2013-03-06 (×5): 500 mg via INTRAVENOUS
  Filled 2013-03-04 (×7): qty 500

## 2013-03-04 MED ORDER — ONDANSETRON HCL 4 MG PO TABS: 4.0000 mg | ORAL_TABLET | Freq: Four times a day (QID) | ORAL | Status: DC | PRN

## 2013-03-04 MED ORDER — LEVALBUTEROL HCL 0.63 MG/3ML IN NEBU
0.6300 mg | INHALATION_SOLUTION | Freq: Four times a day (QID) | RESPIRATORY_TRACT | Status: DC | PRN
  Filled 2013-03-04: qty 3

## 2013-03-04 MED ORDER — SODIUM CHLORIDE 0.9 % IV BOLUS (SEPSIS)
500.0000 mL | Freq: Once | INTRAVENOUS | Status: AC
  Administered 2013-03-04: 500 mL via INTRAVENOUS

## 2013-03-04 MED ORDER — ENOXAPARIN SODIUM 40 MG/0.4ML ~~LOC~~ SOLN
40.0000 mg | SUBCUTANEOUS | Status: DC
  Administered 2013-03-04: 40 mg via SUBCUTANEOUS
  Filled 2013-03-04 (×2): qty 0.4

## 2013-03-04 MED ORDER — ACETAMINOPHEN 325 MG PO TABS
650.0000 mg | ORAL_TABLET | Freq: Four times a day (QID) | ORAL | Status: DC | PRN
  Administered 2013-03-05: 650 mg via ORAL
  Filled 2013-03-04: qty 2

## 2013-03-04 MED ORDER — DOCUSATE SODIUM 100 MG PO CAPS
100.0000 mg | ORAL_CAPSULE | Freq: Two times a day (BID) | ORAL | Status: DC
  Administered 2013-03-04 – 2013-03-12 (×12): 100 mg via ORAL
  Filled 2013-03-04 (×17): qty 1

## 2013-03-04 MED ORDER — SODIUM CHLORIDE 0.9 % IV SOLN
INTRAVENOUS | Status: DC
  Administered 2013-03-04: 75 mL/h via INTRAVENOUS

## 2013-03-04 NOTE — ED Notes (Signed)
Patient transported to MRI 

## 2013-03-04 NOTE — ED Provider Notes (Signed)
History     CSN: [id number]  Arrival date & time 03/04/13  1153   First MD Initiated Contact with Patient 03/04/13 1203      Chief Complaint  Patient presents with  . Abdominal Pain  . 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 Pt was at baseline last night. Daughter states she saw the pt at 1000 today and he appeared to have worsening lower facial drooping. Pt has had ongoing episodic, diffuse abd pain. Pt has not had BM x 4 days. +increased concentration of urine. Pt is poor historian. Level 5 caveat.  Past Medical History  Diagnosis Date  . Hypertension   . Urinary retention   . Hyperlipidemia   . Cardiomyopathy   . H/O cocaine abuse   . Hypothyroidism   . Stroke     "weaker on my left side since" (03/04/2013)  . Liver abscess     measuring 7 x 9 cm/notes 03/04/2013    Past Surgical History  Procedure Laterality Date  . Appendectomy    . Inguinal hernia repair Right   . Tibia fracture surgery Left     History reviewed. No pertinent family history.  History  Substance Use Topics  . Smoking status: Former Smoker -- 1.00 packs/day for 51 years    Types: Cigarettes  . Smokeless tobacco: Not on file     Comment: 03/04/2013 "quit smoking cigarettes over 2 yr ago"  . Alcohol Use: Yes     Comment: 03/04/2013 "last alcohol was ~ 2 yr ago too"      Review of Systems  Unable to perform ROS: Dementia    Allergies  Review of patient's allergies indicates no known allergies.  Home Medications   No current outpatient prescriptions on file.  BP 120/60  Pulse 88  Temp(Src) 98.8 F (37.1 C) (Oral)  Resp 18  Ht 5\' 4"  (1.626 m)  Wt 178 lb 2.1 oz (80.8 kg)  BMI 30.56 kg/m2  SpO2 95%  Physical Exam  Nursing note and vitals reviewed. Constitutional: He appears well-developed and well-nourished. No distress.  HENT:  Head: Normocephalic  and atraumatic.  Mouth/Throat: Oropharynx is clear and moist.  Eyes: EOM are normal.  Blind in right eye, clouded cornea  Neck: Normal range of motion. Neck supple.  Cardiovascular: Regular rhythm.   tachycardia  Pulmonary/Chest: Effort normal and breath sounds normal. No respiratory distress. He has no wheezes. He has no rales. He exhibits no tenderness.  Abdominal: Soft. Bowel sounds are normal. He exhibits distension. He exhibits no mass. There is tenderness (mild diffuse abd tenderness). There is no rebound and no guarding.  Musculoskeletal: Normal range of motion. He exhibits no edema and no tenderness.  Neurological: He is alert.  Follows commands. 4/5 strength in all ext, sensation intact. R eyelid and lower facial droop.   Skin: Skin is warm and dry. No rash noted. No erythema.  Psychiatric: He has a normal mood and affect. His behavior is normal.    ED Course  Procedures (including critical care time)  Labs Reviewed  PROTIME-INR - Abnormal; Notable for the following:    Prothrombin Time 15.3 (*)    All other components within normal limits  CBC - Abnormal; Notable for the following:    WBC 13.8 (*)    RBC 3.14 (*)    Hemoglobin 8.0 (*)    HCT 25.0 (*)    MCH 25.5 (*)  All other components within normal limits  DIFFERENTIAL - Abnormal; Notable for the following:    Neutro Abs 10.5 (*)    Monocytes Absolute 1.1 (*)    All other components within normal limits  COMPREHENSIVE METABOLIC PANEL - Abnormal; Notable for the following:    Glucose, Bld 144 (*)    Total Protein 9.1 (*)    Albumin 2.7 (*)    GFR calc non Af Amer 59 (*)    GFR calc Af Amer 69 (*)    All other components within normal limits  URINALYSIS, ROUTINE W REFLEX MICROSCOPIC - Abnormal; Notable for the following:    Color, Urine AMBER (*)    APPearance CLOUDY (*)    Bilirubin Urine SMALL (*)    Ketones, ur 15 (*)    Protein, ur 30 (*)    Leukocytes, UA MODERATE (*)    All other components within  normal limits  GLUCOSE, CAPILLARY - Abnormal; Notable for the following:    Glucose-Capillary 136 (*)    All other components within normal limits  URINE MICROSCOPIC-ADD ON - Abnormal; Notable for the following:    Bacteria, UA FEW (*)    All other components within normal limits  CBC - Abnormal; Notable for the following:    WBC 12.6 (*)    RBC 3.18 (*)    Hemoglobin 8.3 (*)    HCT 25.2 (*)    All other components within normal limits  TSH - Abnormal; Notable for the following:    TSH 5.221 (*)    All other components within normal limits  PROTIME-INR - Abnormal; Notable for the following:    Prothrombin Time 16.0 (*)    All other components within normal limits  GLUCOSE, CAPILLARY - Abnormal; Notable for the following:    Glucose-Capillary 124 (*)    All other components within normal limits  COMPREHENSIVE METABOLIC PANEL - Abnormal; Notable for the following:    Sodium 134 (*)    Glucose, Bld 120 (*)    Albumin 2.2 (*)    GFR calc non Af Amer 72 (*)    GFR calc Af Amer 84 (*)    All other components within normal limits  CBC - Abnormal; Notable for the following:    WBC 10.6 (*)    RBC 2.75 (*)    Hemoglobin 7.1 (*)    HCT 21.6 (*)    MCH 25.8 (*)    RDW 15.8 (*)    All other components within normal limits  GLUCOSE, CAPILLARY - Abnormal; Notable for the following:    Glucose-Capillary 118 (*)    All other components within normal limits  GLUCOSE, CAPILLARY - Abnormal; Notable for the following:    Glucose-Capillary 121 (*)    All other components within normal limits  CBC - Abnormal; Notable for the following:    WBC 11.5 (*)    RBC 2.87 (*)    Hemoglobin 7.3 (*)    HCT 22.5 (*)    MCH 25.4 (*)    RDW 15.6 (*)    All other components within normal limits  BASIC METABOLIC PANEL - Abnormal; Notable for the following:    Glucose, Bld 131 (*)    GFR calc non Af Amer 61 (*)    GFR calc Af Amer 70 (*)    All other components within normal limits  GLUCOSE,  CAPILLARY - Abnormal; Notable for the following:    Glucose-Capillary 139 (*)    All other components within normal  limits  GLUCOSE, CAPILLARY - Abnormal; Notable for the following:    Glucose-Capillary 141 (*)    All other components within normal limits  RETICULOCYTES - Abnormal; Notable for the following:    RBC. 2.71 (*)    All other components within normal limits  GLUCOSE, CAPILLARY - Abnormal; Notable for the following:    Glucose-Capillary 148 (*)    All other components within normal limits  POCT I-STAT, CHEM 8 - Abnormal; Notable for the following:    Glucose, Bld 144 (*)    Hemoglobin 8.5 (*)    HCT 25.0 (*)    All other components within normal limits  CULTURE, BLOOD (ROUTINE X 2)  CULTURE, BLOOD (ROUTINE X 2)  ANAEROBIC CULTURE  CULTURE, ROUTINE-ABSCESS  URINE CULTURE  BODY FLUID CULTURE  CULTURE, BLOOD (ROUTINE X 2)  APTT  MAGNESIUM  PHOSPHORUS  TROPONIN I  AMMONIA  TROPONIN I  VITAMIN B12  FOLATE  IRON AND TIBC  FERRITIN  E. HISTOLYTICA ANTIBODY (AMOEBA AB)  HIV ANTIBODY (ROUTINE TESTING)  HEPATITIS PANEL, ACUTE   Mr [name] Wo Contrast  03/04/2013  *RADIOLOGY REPORT*  Clinical Data: Evaluate for abscess.  History high blood pressure and cocaine abuse.  Right facial droop.  MRI HEAD WITHOUT AND WITH CONTRAST  Technique:  Multiplanar, multiecho pulse sequences of the brain and surrounding structures were obtained according to standard protocol without and with intravenous contrast  Contrast: 19mL MULTIHANCE GADOBENATE DIMEGLUMINE 529 MG/ML IV SOLN  Comparison: 03/04/2013 the head CT.  05/01/2011 brain MR.  Findings: No acute infarct.  No intracranial mass or findings of intracranial abscess.  Remote infarct right thalamus.  Remote infarct posterior superior right frontal lobe.  Prominent small vessel disease type changes.  Atrophy without hydrocephalus.  No intracranial hemorrhage.  Heterogeneous bone marrow may reflect changes of underlying anemia.  Abnormal  appearance right globe suggestive of prior retinal detachment.  Major intracranial vascular structures are patent.  Paranasal sinus mucosal thickening.  IMPRESSION: No acute infarct or findings of intracranial abscess.  Please see above.   Original Report Authenticated By: Lacy Duverney, M.D.    Ct Abdomen Pelvis W Contrast  03/04/2013  *RADIOLOGY REPORT*  Clinical Data: Abdominal pain, lethargy.  CT ABDOMEN AND PELVIS WITH CONTRAST  Technique:  Multidetector CT imaging of the abdomen and pelvis was performed following the standard protocol during bolus administration of intravenous contrast.  Contrast: OMNIPAQUE IOHEXOL 300 MG/ML  SOLN  Comparison: None.  Findings: Linear scarring or atelectasis posteriorly in the visualized left lower lung.  There is a complex multiloculated 7.9 cm lesion in the left hepatic lobe.  There is some mild enhancement around the central fluid attenuation components and some edematous changes in the liver parenchyma more peripherally.  No other liver lesion is evident.  Unremarkable spleen, adrenal glands, kidneys, pancreas.  Aortoiliac atheromatous calcifications and plaque without aneurysm or stenosis.  Portal vein remains patent.  The stomach, small bowel, and colon are nondilated.  Foley catheter decompresses the urinary bladder.  Prostatic enlargement with central coarse calcifications. No ascites.  No free air.  No adenopathy evident.  No hydronephrosis.  Spondylitic changes in the thoracolumbar spine most marked L3-4 and L5-S1.  IMPRESSION:  1.  Multiloculated cystic liver lesion, favor abscess over neoplasm.  This would be approachable for percutaneous drainage.   Original Report Authenticated By: D. Andria Rhein, MD    Ir Era Skeen W Catheter Placement  03/05/2013  *RADIOLOGY REPORT*  Clinical Data: Left hepatic abscess, polysubstance  abuse  ULTRASOUND LEFT HEPATIC ABSCESS DRAIN INSERTION  Date:  03/05/2013 10:40:00  Radiologist:  M. Ruel Favors, M.D.  Medications:  2  mg Versed, 50 mcg Fentanyl  Guidance:  Ultrasound  Fluoroscopy time:  None.  Sedation time:  11 minutes  Contrast volume:  None.  Complications:  No immediate  PROCEDURE/FINDINGS:  Informed consent was obtained from the patient following explanation of the procedure, risks, benefits and alternatives. The patient understands, agrees and consents for the procedure. All questions were addressed.  A time out was performed.  Maximal barrier sterile technique utilized including caps, mask, sterile gowns, sterile gloves, large sterile drape, hand hygiene, and betadine  Previous imaging reviewed.  Preliminary ultrasound performed of the left hepatic lobe from a sub xyphoid approach.  The heterogeneous fluid collection in the left hepatic lobe lateral segment was localized.  Under sterile conditions and local anesthesia, an 18 gauge 15 cm access needle was advanced percutaneously into the left hepatic abscess.  Syringe aspiration yielded exudative fluid. Sample sent for Gram stain and culture.  Guide wire inserted followed by tract dilatation to insert 10-French drain.  Syringe aspiration yielded 30 ml additional exudative fluid.  Catheter secured with a Prolene suture and connected to external suction bulb.  Sterile dressing applied.  No immediate complication.  The patient tolerated the procedure well.  IMPRESSION: Successful ultrasound left hepatic abscess drain insertion   Original Report Authenticated By: Judie Petit. Miles Costain, M.D.        Date: 03/04/2013  Rate: 125  Rhythm: sinus tachycardia  QRS Axis: normal  Intervals: normal  ST/T Wave abnormalities: normal  Conduction Disutrbances:none  Narrative Interpretation:   Old EKG Reviewed: none available    MDM    Signed out to oncoming EDP pending CT and dispo      Loren Racer, MD 03/06/13 1547

## 2013-03-04 NOTE — H&P (Signed)
[facility] History and Physical  KIRKLAND FIGG UJW:[id number] DOB: July 25, 1946 DOA: 03/04/2013  Referring physician: Abdominal pain  PCP: Dorrene German, MD   Chief Complaint: *Abdominal pain HPI:  67 year old male with a history of cardiomyopathy, hypertension, cocaine use, CVA presents with a one-week history of nausea vomiting abdominal pain. The patient has also been constipated. The patient had a BM about a week ago. According to the daughter provides most of the history the patient has also been lethargic. She also reports loss of appetite. Daughter states that the patient has had strokes in the past and has had residual left-sided weakness and right-sided weakness however he was noticed to have a right facial droop this morning at around 10 AM. CT scan of the abdomen and pelvis showed multiloculated cystic liver lesion possible abscess of measuring 7.9 cm.      Review of Systems: negative for the following  Constitutional: Denies fever, chills, diaphoresis, appetite change and fatigue.  HEENT: Denies photophobia, eye pain, redness, hearing loss, ear pain, congestion, sore throat, rhinorrhea, sneezing, mouth sores, trouble swallowing, neck pain, neck stiffness and tinnitus.  Respiratory: Denies SOB, DOE, cough, chest tightness, and wheezing.  Cardiovascular: Denies chest pain, palpitations and leg swelling.  Gastrointestinal: Denies nausea, vomiting, abdominal pain, diarrhea, constipation, blood in stool and abdominal distention.  Genitourinary: Denies dysuria, urgency, frequency, hematuria, flank pain and difficulty urinating.  Musculoskeletal: Denies myalgias, back pain, joint swelling, arthralgias and gait problem.  Skin: Denies pallor, rash and wound.  Neurological: Denies dizziness, seizures, syncope, weakness, light-headedness, numbness and headaches.  Hematological: Denies adenopathy. Easy bruising, personal or family bleeding history  Psychiatric/Behavioral: Denies  suicidal ideation, mood changes, confusion, nervousness, sleep disturbance and agitation       Past Medical History  Diagnosis Date  . Stroke   . Hypertension   . Urinary retention   . Hyperlipidemia   . Cardiomyopathy   . H/O cocaine abuse   . Thyroid disease      No past surgical history on file.    Social History:  reports that he has quit smoking. He does not have any smokeless tobacco history on file. He reports that he does not drink alcohol or use illicit drugs.   Family history negative for stroke  No Known Allergies  No family history on file.   Prior to Admission medications   Medication Sig Start Date End Date Taking? Authorizing Provider  aspirin 81 MG chewable tablet Chew 81 mg by mouth daily.   Yes Historical Provider, MD  carvedilol (COREG) 3.125 MG tablet Take 3.125 mg by mouth 2 (two) times daily with a meal.   Yes Historical Provider, MD  clopidogrel (PLAVIX) 75 MG tablet Take 75 mg by mouth daily.   Yes Historical Provider, MD  doxazosin (CARDURA) 4 MG tablet Take 4 mg by mouth at bedtime.   Yes Historical Provider, MD  levothyroxine (SYNTHROID, LEVOTHROID) 100 MCG tablet Take 100 mcg by mouth daily.   Yes Historical Provider, MD  simvastatin (ZOCOR) 40 MG tablet Take 40 mg by mouth every evening.   Yes Historical Provider, MD     Physical Exam: Filed Vitals:   03/04/13 1710 03/04/13 1715 03/04/13 1745 03/04/13 1800  BP: 129/72 116/70 124/73 111/77  Pulse: 117 118 126 118  Temp:      Resp: 19 19 24 23   SpO2: 99% 98% 99% 98%     Constitutional: Vital signs reviewed. Patient is a well-developed and well-nourished in no acute distress and cooperative with  exam. Alert and oriented to self.  Head: Normocephalic and atraumatic  Ear: TM normal bilaterally  Mouth: no erythema or exudates, MMM  Eyes: PERRL, EOMI, conjunctivae normal, No scleral icterus.  Neck: Supple, Trachea midline normal ROM, No JVD, mass, thyromegaly, or carotid bruit present.   Cardiovascular: RRR, S1 normal, S2 normal, no MRG, pulses symmetric and intact bilaterally  Pulmonary/Chest: CTAB, no wheezes, rales, or rhonchi  Abdominal: Soft. He exhibits distension. He exhibits no mass. There is tenderness (mild diffuse abd tenderness). There is no rebound and no guarding  Musculoskeletal: No joint deformities, erythema, or stiffness, ROM full and no nontender Ext: no edema and no cyanosis, pulses palpable bilaterally (DP and PT)  Hematology: no cervical, inginal, or axillary adenopathy.  Neurological: A&O x3, Strenght is normal and symmetric bilaterally, cranial nerve II-XII are grossly intact, no focal motor deficit, sensory intact to light touch bilaterally.  Skin: Warm, dry and intact. No rash, cyanosis, or clubbing.  Psychiatric: Normal mood and affect. speech and behavior is normal. Judgment and thought content normal. Cognition and memory are normal.       Labs on Admission:    Basic Metabolic Panel:  Recent Labs Lab 03/04/13 1208 03/04/13 1255  NA 135 139  K 4.5 4.5  CL 96 104  CO2 26  --   GLUCOSE 144* 144*  BUN 20 22  CREATININE 1.21 1.20  CALCIUM 9.7  --    Liver Function Tests:  Recent Labs Lab 03/04/13 1208  AST 34  ALT 23  ALKPHOS 115  BILITOT 0.7  PROT 9.1*  ALBUMIN 2.7*   No results found for this basename: LIPASE, AMYLASE,  in the last 168 hours No results found for this basename: AMMONIA,  in the last 168 hours CBC:  Recent Labs Lab 03/04/13 1208 03/04/13 1255  WBC 13.8*  --   NEUTROABS 10.5*  --   HGB 8.0* 8.5*  HCT 25.0* 25.0*  MCV 79.6  --   PLT 323  --    Cardiac Enzymes: No results found for this basename: CKTOTAL, CKMB, CKMBINDEX, TROPONINI,  in the last 168 hours  BNP (last 3 results) No results found for this basename: PROBNP,  in the last 8760 hours    CBG:  Recent Labs Lab 03/04/13 1210  GLUCAP 136*    Radiological Exams on Admission: Ct Head Wo Contrast  03/04/2013  *RADIOLOGY REPORT*   Clinical Data: Facial droop  CT HEAD WITHOUT CONTRAST  Technique:  Contiguous axial images were obtained from the base of the skull through the vertex without contrast.  Comparison: 05/01/2011  Findings: No skull fracture is noted.  Paranasal sinuses and mastoid air cells are unremarkable.  Stable cerebral atrophy. Small lacunar infarct is noted in the right thalamus. Periventricular and subcortical white matter decreased attenuation probable due to chronic small vessel ischemic changes.  No acute infarction.  No mass lesion is noted on this unenhanced scan.  IMPRESSION: No acute intracranial abnormality.  Stable atrophy and chronic white matter disease.   Original Report Authenticated By: Natasha Mead, M.D.    Ct Abdomen Pelvis W Contrast  03/04/2013  *RADIOLOGY REPORT*  Clinical Data: Abdominal pain, lethargy.  CT ABDOMEN AND PELVIS WITH CONTRAST  Technique:  Multidetector CT imaging of the abdomen and pelvis was performed following the standard protocol during bolus administration of intravenous contrast.  Contrast: OMNIPAQUE IOHEXOL 300 MG/ML  SOLN  Comparison: None.  Findings: Linear scarring or atelectasis posteriorly in the visualized left lower lung.  There  is a complex multiloculated 7.9 cm lesion in the left hepatic lobe.  There is some mild enhancement around the central fluid attenuation components and some edematous changes in the liver parenchyma more peripherally.  No other liver lesion is evident.  Unremarkable spleen, adrenal glands, kidneys, pancreas.  Aortoiliac atheromatous calcifications and plaque without aneurysm or stenosis.  Portal vein remains patent.  The stomach, small bowel, and colon are nondilated.  Foley catheter decompresses the urinary bladder.  Prostatic enlargement with central coarse calcifications. No ascites.  No free air.  No adenopathy evident.  No hydronephrosis.  Spondylitic changes in the thoracolumbar spine most marked L3-4 and L5-S1.  IMPRESSION:  1.  Multiloculated  cystic liver lesion, favor abscess over neoplasm.  This would be approachable for percutaneous drainage.   Original Report Authenticated By: D. Andria Rhein, MD    Dg Abd Acute W/chest  03/04/2013  *RADIOLOGY REPORT*  Clinical Data: Acute abdominal pain, facial droop  ACUTE ABDOMEN SERIES (ABDOMEN 2 VIEW & CHEST 1 VIEW)  Comparison: 08/15/2012  Findings: Cardiomediastinal silhouette is stable.  No acute infiltrate or pulmonary edema.  Mild gaseous distended small bowel loops mid abdomen suspicious for ileus.  No free abdominal air.  IMPRESSION: No acute disease within chest.  Mild gaseous distended small bowel loops mid abdomen suspicious for ileus.  No free abdominal air.   Original Report Authenticated By: Natasha Mead, M.D.     EKG: Independently reviewed.    Assessment/Plan  Liver abscess measuring 7 x 9 cm we'll consult interventional radiology for possible aspiration Body fluid for culture Start the patient on Primaxin    Altered mental status We'll check ammonia level CT scan negative We'll need an MRI to rule out brain abscess  Anemia hemoglobin was 13.4 in 2013, now 8.5 We'll check stool guaiacs, anemia panel, TSH,   Code Status:   full Family Communication: bedside Disposition Plan: admit   Time spent: 70 mins   [facility] Pager [phone]  If 7PM-7AM, please contact night-coverage [email or web address] Password [facility] 03/04/2013, 6:35 PM

## 2013-03-04 NOTE — ED Notes (Signed)
Patient given oral contrast by CT. Patient drank 1st cup and notified CT.

## 2013-03-04 NOTE — ED Notes (Addendum)
Spoke with phlebotomy will draw both set of cultures. Sent message to pharmacy to send antibiotics to [facility].

## 2013-03-04 NOTE — ED Notes (Signed)
Pt's CBG was 136 when I checked it and filling out a paper on pt to lab because pt was registered yet.12:13pm JG.

## 2013-03-04 NOTE — ED Notes (Signed)
Report given to Tammy

## 2013-03-04 NOTE — ED Notes (Signed)
6700 RN informed that Pt. Is back from MRI and is being transported per ER staff upstairs.

## 2013-03-04 NOTE — ED Notes (Signed)
Pt finished both of his liquid contrast drinks

## 2013-03-04 NOTE — ED Notes (Signed)
Patient transported back from MRI. 

## 2013-03-04 NOTE — ED Provider Notes (Signed)
Brad Harrell is a 67 y.o. male who presents for evaluation of decreased appetite, malaise, abdominal pain, and possible facial droop, right.  Evaluation by initial treatment team revealed normal head CT. Elevation white blood cell count and anemia. Urinalysis is abnormal. When the Foley catheter was placed. He had about 300 cc of urine in, indicating urinary retention. CT abdomen ordered to rule out intra-abdominal process. CT abdomen returns revealing evidence for liver abscess.  Patient Vitals for the past 24 hrs:  BP Temp Pulse Resp SpO2  03/04/13 1710 129/72 mmHg - 117 19 99 %  03/04/13 1615 154/93 mmHg - 118 20 98 %  03/04/13 1600 146/89 mmHg - 117 22 98 %  03/04/13 1545 122/73 mmHg - 113 20 98 %  03/04/13 1530 131/77 mmHg - 116 20 98 %  03/04/13 1515 128/76 mmHg - 121 21 98 %  03/04/13 1415 124/74 mmHg - 119 20 98 %  03/04/13 1400 130/88 mmHg - 122 17 99 %  03/04/13 1358 - 98.4 F (36.9 C) - - -  03/04/13 1352 115/76 mmHg - - 18 100 %  03/04/13 1215 128/82 mmHg - 122 23 98 %  03/04/13 1205 136/77 mmHg 98.8 F (37.1 C) 127 18 99 %    Patient evaluated by me at 17:40- is alert, calm, cooperative. He relates that his daughter brought him here for a possible stroke. She apparently indicated to Dr. Ranae Palms, that she was concerned about a right facial droop. Head CT has been negative. He relates having right abdominal pain and decreased appetite for one to 2 weeks. He has not had this recently prior to that. He is a somewhat vague historian. He is not sure, if he's had fevers, cough, shortness of breath, or chest pain. He denies headache  Exam: Alert, communicative. Heart tachycardic rate and normal rhythm. No murmur. Lungs clear anteriorly. Abdomen soft, mild right upper quadrant tenderness. No palpable mass or hepatosplenomegaly. There is no rebound tenderness.  Assessment: Abdominal pain and anorexia related to left lobe liver abscess. He'll need to be admitted for stabilization.  Doubt CVA.  Flint Melter, MD 03/05/13 [id number]

## 2013-03-04 NOTE — ED Notes (Signed)
Patient transported to CT 

## 2013-03-04 NOTE — ED Notes (Signed)
Pt c/o generalized abdominal pain x1 week. Denies N/V/D. sts last BM was last week. Pt lethargic. Oriented to self. Per daughter pt has "not been feeling well or had an appetite this week". Daughter sts has strokes in the past with residual left side and right sided weakness. Daughter sts this morning when he woke up noticed right sided facial droop at 10AM LSN- 03/03/2013 10pm

## 2013-03-05 ENCOUNTER — Inpatient Hospital Stay (HOSPITAL_COMMUNITY): Payer: Medicare Other

## 2013-03-05 ENCOUNTER — Encounter (HOSPITAL_COMMUNITY): Payer: Self-pay | Admitting: Radiology

## 2013-03-05 DIAGNOSIS — D649 Anemia, unspecified: Secondary | ICD-10-CM | POA: Diagnosis present

## 2013-03-05 DIAGNOSIS — I10 Essential (primary) hypertension: Secondary | ICD-10-CM

## 2013-03-05 DIAGNOSIS — G9341 Metabolic encephalopathy: Secondary | ICD-10-CM | POA: Diagnosis present

## 2013-03-05 DIAGNOSIS — K750 Abscess of liver: Secondary | ICD-10-CM | POA: Diagnosis present

## 2013-03-05 DIAGNOSIS — R6510 Systemic inflammatory response syndrome (SIRS) of non-infectious origin without acute organ dysfunction: Secondary | ICD-10-CM

## 2013-03-05 LAB — CBC
HCT: 21.6 % — ABNORMAL LOW (ref 39.0–52.0)
Hemoglobin: 7.1 g/dL — ABNORMAL LOW (ref 13.0–17.0)
MCH: 25.8 pg — ABNORMAL LOW (ref 26.0–34.0)
MCV: 78.5 fL (ref 78.0–100.0)
Platelets: 252 10*3/uL (ref 150–400)
RBC: 2.75 MIL/uL — ABNORMAL LOW (ref 4.22–5.81)
WBC: 10.6 10*3/uL — ABNORMAL HIGH (ref 4.0–10.5)

## 2013-03-05 LAB — TROPONIN I: Troponin I: <0.3 ng/mL (ref <0.30)

## 2013-03-05 LAB — TSH: TSH: 5.221 u[IU]/mL — ABNORMAL HIGH (ref 0.350–4.500)

## 2013-03-05 LAB — COMPREHENSIVE METABOLIC PANEL
AST: 24 U/L (ref 0–37)
Albumin: 2.2 g/dL — ABNORMAL LOW (ref 3.5–5.2)
BUN: 15 mg/dL (ref 6–23)
Calcium: 8.9 mg/dL (ref 8.4–10.5)
Creatinine, Ser: 1.03 mg/dL (ref 0.50–1.35)
Total Protein: 7.7 g/dL (ref 6.0–8.3)

## 2013-03-05 LAB — GLUCOSE, CAPILLARY
Glucose-Capillary: 121 mg/dL — ABNORMAL HIGH (ref 70–99)
Glucose-Capillary: 124 mg/dL — ABNORMAL HIGH (ref 70–99)
Glucose-Capillary: 139 mg/dL — ABNORMAL HIGH (ref 70–99)

## 2013-03-05 MED ORDER — METOPROLOL TARTRATE 1 MG/ML IV SOLN
10.0000 mg | INTRAVENOUS | Status: AC
  Administered 2013-03-05: 10 mg via INTRAVENOUS
  Filled 2013-03-05: qty 10

## 2013-03-05 MED ORDER — FENTANYL CITRATE 0.05 MG/ML IJ SOLN
INTRAMUSCULAR | Status: AC
  Administered 2013-03-05: 11:00:00
  Filled 2013-03-05: qty 4

## 2013-03-05 MED ORDER — IOHEXOL 300 MG/ML  SOLN: 50.0000 mL | Freq: Once | INTRAMUSCULAR | Status: DC | PRN

## 2013-03-05 MED ORDER — MIDAZOLAM HCL 2 MG/2ML IJ SOLN
INTRAMUSCULAR | Status: AC
  Administered 2013-03-05: 11:00:00
  Filled 2013-03-05: qty 6

## 2013-03-05 MED ORDER — CARVEDILOL 6.25 MG PO TABS
6.2500 mg | ORAL_TABLET | Freq: Two times a day (BID) | ORAL | Status: DC
  Administered 2013-03-05 – 2013-03-12 (×13): 6.25 mg via ORAL
  Filled 2013-03-05 (×16): qty 1

## 2013-03-05 MED ORDER — MIDAZOLAM HCL 2 MG/2ML IJ SOLN
INTRAMUSCULAR | Status: AC | PRN
  Administered 2013-03-05: 2 mg via INTRAVENOUS

## 2013-03-05 MED ORDER — FENTANYL CITRATE 0.05 MG/ML IJ SOLN
INTRAMUSCULAR | Status: AC | PRN
  Administered 2013-03-05: 50 ug via INTRAVENOUS

## 2013-03-05 MED ORDER — ENOXAPARIN SODIUM 40 MG/0.4ML ~~LOC~~ SOLN
40.0000 mg | SUBCUTANEOUS | Status: DC
  Filled 2013-03-05: qty 0.4

## 2013-03-05 NOTE — Procedures (Signed)
Successful 81fr [place] LEFT LIVER ABSCESS DRAIN INSERTION NO COMP 30CC PUS ASPIRATED GS/CX SENT FULL REPORT IN PACS

## 2013-03-05 NOTE — Progress Notes (Signed)
Pt with HR sustaining in 140s, bp 156/72, temp 99.0. MD notified, 1mg  of morphine given, MD orders to resume coreg. Emptied 80 mls tan/green fluid from JP and flushed with 5 mls NS. Will continue to notify.

## 2013-03-05 NOTE — Progress Notes (Signed)
[facility] PROGRESS NOTE  Brad Harrell OZH:[id number] DOB: [birth year] DOA: 03/04/2013 PCP: Brad German, MD  Assessment/Plan: 1. Liver abscess/cyst: - started on empiric IV Primaxin/flagyl by Dr.ABrol yesterday - IR seeing for liver biopsy today, Fu cultures -Will request ID consult  2. Metabolic encephalopathy: - due to above -improved -MRI negative for CVA  3. H/o CVA -with residual hemiparesis -continue ASA/Plavix/statin  4. Anemia: - Fu anemia panel, hemoccult  Code Status: full  Family Communication: none at bedside  Disposition Plan: home when improved    Consultants: IR  Antibiotics:  IV primaxin 5/6  IV flagyl 5/6    HPI/Subjective: Feels ok, no complaints, poor historian  Objective: Filed Vitals:   03/04/13 1900 03/04/13 2000 03/04/13 2140 03/05/13 0601  BP: 115/71 122/81 154/75 129/60  Pulse: 117 118 123 122  Temp:   99.5 F (37.5 C) 101.2 F (38.4 C)  TempSrc:   Oral Oral  Resp: 22 23 22 20   Height:   5\' 4"  (1.626 m)   Weight:   79.493 kg (175 lb 4 oz)   SpO2: 98% 98% 100% 97%    Intake/Output Summary (Last 24 hours) at 03/05/13 0915 Last data filed at 03/05/13 6295  Gross per 24 hour  Intake    480 ml  Output    950 ml  Net   -470 ml   Filed Weights   03/04/13 2140  Weight: 79.493 kg (175 lb 4 oz)    Exam:   General:  AAO to self and place  Cardiovascular: S1S2/RRR  Respiratory: CTAB  Abdomen: soft, NT, slightly distended, BS present  Ext: mild residual L leg weakness   Data Reviewed: Basic Metabolic Panel:  Recent Labs Lab 03/04/13 1208 03/04/13 1255 03/04/13 1945  NA 135 139  --   K 4.5 4.5  --   CL 96 104  --   CO2 26  --   --   GLUCOSE 144* 144*  --   BUN 20 22  --   CREATININE 1.21 1.20  --   CALCIUM 9.7  --   --   MG  --   --  2.5  PHOS  --   --  4.1   Liver Function Tests:  Recent Labs Lab 03/04/13 1208  AST 34  ALT 23  ALKPHOS 115  BILITOT 0.7  PROT 9.1*  ALBUMIN 2.7*   No  results found for this basename: LIPASE, AMYLASE,  in the last 168 hours  Recent Labs Lab 03/04/13 1920  AMMONIA 23   CBC:  Recent Labs Lab 03/04/13 1208 03/04/13 1255 03/04/13 1945  WBC 13.8*  --  12.6*  NEUTROABS 10.5*  --   --   HGB 8.0* 8.5* 8.3*  HCT 25.0* 25.0* 25.2*  MCV 79.6  --  79.2  PLT 323  --  316   Cardiac Enzymes:  Recent Labs Lab 03/04/13 1937 03/05/13 0054  TROPONINI <0.30 <0.30   BNP (last 3 results) No results found for this basename: PROBNP,  in the last 8760 hours CBG:  Recent Labs Lab 03/04/13 1210 03/05/13 0026 03/05/13 0603  GLUCAP 136* 124* 118*    No results found for this or any previous visit (from the past 240 hour(s)).   Studies: Ct Head Wo Contrast  03/04/2013  *RADIOLOGY REPORT*  Clinical Data: Facial droop  CT HEAD WITHOUT CONTRAST  Technique:  Contiguous axial images were obtained from the base of the skull through the vertex without contrast.  Comparison: 05/01/2011  Findings:  No skull fracture is noted.  Paranasal sinuses and mastoid air cells are unremarkable.  Stable cerebral atrophy. Small lacunar infarct is noted in the right thalamus. Periventricular and subcortical white matter decreased attenuation probable due to chronic small vessel ischemic changes.  No acute infarction.  No mass lesion is noted on this unenhanced scan.  IMPRESSION: No acute intracranial abnormality.  Stable atrophy and chronic white matter disease.   Original Report Authenticated By: Natasha Mead, M.D.    Mr [name] Wo Contrast  03/04/2013  *RADIOLOGY REPORT*  Clinical Data: Evaluate for abscess.  History high blood pressure and cocaine abuse.  Right facial droop.  MRI HEAD WITHOUT AND WITH CONTRAST  Technique:  Multiplanar, multiecho pulse sequences of the brain and surrounding structures were obtained according to standard protocol without and with intravenous contrast  Contrast: 19mL MULTIHANCE GADOBENATE DIMEGLUMINE 529 MG/ML IV SOLN  Comparison: 03/04/2013  the head CT.  05/01/2011 brain MR.  Findings: No acute infarct.  No intracranial mass or findings of intracranial abscess.  Remote infarct right thalamus.  Remote infarct posterior superior right frontal lobe.  Prominent small vessel disease type changes.  Atrophy without hydrocephalus.  No intracranial hemorrhage.  Heterogeneous bone marrow may reflect changes of underlying anemia.  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  Major intracranial vascular structures are patent.  Paranasal sinus mucosal thickening.  IMPRESSION: No acute infarct or findings of intracranial abscess.  Please see above.   Original Report Authenticated By: Lacy Duverney, M.D.    Ct Abdomen Pelvis W Contrast  03/04/2013  *RADIOLOGY REPORT*  Clinical Data: Abdominal pain, lethargy.  CT ABDOMEN AND PELVIS WITH CONTRAST  Technique:  Multidetector CT imaging of the abdomen and pelvis was performed following the standard protocol during bolus administration of intravenous contrast.  Contrast: OMNIPAQUE IOHEXOL 300 MG/ML  SOLN  Comparison: None.  Findings: Linear scarring or atelectasis posteriorly in the visualized left lower lung.  There is a complex multiloculated 7.9 cm lesion in the left hepatic lobe.  There is some mild enhancement around the central fluid attenuation components and some edematous changes in the liver parenchyma more peripherally.  No other liver lesion is evident.  Unremarkable spleen, adrenal glands, kidneys, pancreas.  Aortoiliac atheromatous calcifications and plaque without aneurysm or stenosis.  Portal vein remains patent.  The stomach, small bowel, and colon are nondilated.  Foley catheter decompresses the urinary bladder.  Prostatic enlargement with central coarse calcifications. No ascites.  No free air.  No adenopathy evident.  No hydronephrosis.  Spondylitic changes in the thoracolumbar spine most marked L3-4 and L5-S1.  IMPRESSION:  1.  Multiloculated cystic liver lesion,  favor abscess over neoplasm.  This would be approachable for percutaneous drainage.   Original Report Authenticated By: D. Andria Rhein, MD    Dg Abd Acute W/chest  03/04/2013  *RADIOLOGY REPORT*  Clinical Data: Acute abdominal pain, facial droop  ACUTE ABDOMEN SERIES (ABDOMEN 2 VIEW & CHEST 1 VIEW)  Comparison: 08/15/2012  Findings: Cardiomediastinal silhouette is stable.  No acute infiltrate or pulmonary edema.  Mild gaseous distended small bowel loops mid abdomen suspicious for ileus.  No free abdominal air.  IMPRESSION: No acute disease within chest.  Mild gaseous distended small bowel loops mid abdomen suspicious for ileus.  No free abdominal air.   Original Report Authenticated By: Natasha Mead, M.D.     Scheduled Meds: . sodium chloride   Intravenous STAT  . docusate sodium  100 mg Oral BID  . imipenem-cilastatin  500 mg Intravenous Q8H  . metronidazole  500 mg Intravenous Q8H  . senna  1 tablet Oral BID  . sodium chloride  3 mL Intravenous Q12H   Continuous Infusions: . sodium chloride 75 mL/hr (03/04/13 1857)    Active Problems:   Cardiomyopathy   H/O cocaine abuse   Stroke   Liver abscess   Encephalopathy, metabolic   Anemia    Time spent:    [facility]  [facility] Pager [phone]. If 7PM-7AM, please contact night-coverage at [email or web address], password [facility] 03/05/2013, 9:15 AM  LOS: 1 day

## 2013-03-05 NOTE — Progress Notes (Signed)
Pt with increasing lethargy and sustained HR in the 140s. BP 136/64, temperature 104.1 orally. No cx of pain, responds appropriately. MD notified, ordered to give 650 Tylenol, Lopressor, and draw cultures. Per lab u/a to get second set of cultures within less than 24 hrs. MD notified, will continue to monitor.

## 2013-03-05 NOTE — Progress Notes (Addendum)
INITIAL NUTRITION ASSESSMENT  DOCUMENTATION CODES Per approved criteria  -Obesity Unspecified   INTERVENTION: 1. Add oral nutrition supplement once diet allows - Resource Breeze vs Ensure Complete. 2. Recommend MVI once diet ordered 3. RD to continue to follow nutrition care plan.  NUTRITION DIAGNOSIS: Inadequate oral intake related to poor appetite as evidenced by H&P.  Goal: Intake to meet >90% of estimated nutrition needs.  Monitor:  weight trends, lab trends, I/O's, PO intake, supplement tolerance  Reason for Assessment: Malnutrition Screening  67 y.o. male  Admitting Dx: large liver abscess  ASSESSMENT: Admitted with decreased appetite, malaise, abdominal pain, and possible R facial droop x 1 - 2 weeks. ER work-up reveals liver abscess, scheduled for liver abscess drain placement. Notable hx of cocaine use.  12.5% wt loss x 7 months - this is significant. Pt reports that he has been not eating well but is not providing any additional information. Family and friends at bedside confirm weight loss however they don't have insight into how his meals have been going. Unable to complete physical exam at this time. Pt likely has malnutrition given polysubstance abuse, poor oral intake PTA, and significant wt loss. Noted magnesium, potassium, and phosphorus all WNL on admission.  Pt would benefit from oral nutrition supplement once diet advanced.  Height: Ht Readings from Last 1 Encounters:  03/04/13 5\' 4"  (1.626 m)    Weight: Wt Readings from Last 1 Encounters:  03/04/13 175 lb 4 oz (79.493 kg)    Ideal Body Weight: 130 lb  % Ideal Body Weight: 135%  Wt Readings from Last 10 Encounters:  03/04/13 175 lb 4 oz (79.493 kg)  08/17/12 200 lb 2.8 oz (90.8 kg)    Usual Body Weight: 200 lb  % Usual Body Weight: 87.5%; 12.5% wt loss x 7 months  BMI:  Body mass index is 30.07 kg/(m^2). Obese Class I  Estimated Nutritional Needs: Kcal: 1700 - 1900 kcal Protein: 75 - 90  grams Fluid: 1.7 - 1.9 liters  Skin: intact  Diet Order: NPO  EDUCATION NEEDS: -No education needs identified at this time   Intake/Output Summary (Last 24 hours) at 03/05/13 0905 Last data filed at 03/05/13 0611  Gross per 24 hour  Intake    480 ml  Output    950 ml  Net   -470 ml    Last BM: PTA  Labs:   Recent Labs Lab 03/04/13 1208 03/04/13 1255 03/04/13 1945  NA 135 139  --   K 4.5 4.5  --   CL 96 104  --   CO2 26  --   --   BUN 20 22  --   CREATININE 1.21 1.20  --   CALCIUM 9.7  --   --   MG  --   --  2.5  PHOS  --   --  4.1  GLUCOSE 144* 144*  --     CBG (last 3)   Recent Labs  03/04/13 1210 03/05/13 0026 03/05/13 0603  GLUCAP 136* 124* 118*    Scheduled Meds: . sodium chloride   Intravenous STAT  . docusate sodium  100 mg Oral BID  . imipenem-cilastatin  500 mg Intravenous Q8H  . metronidazole  500 mg Intravenous Q8H  . senna  1 tablet Oral BID  . sodium chloride  3 mL Intravenous Q12H    Continuous Infusions: . sodium chloride 75 mL/hr (03/04/13 1857)    Past Medical History  Diagnosis Date  . Hypertension   .  Urinary retention   . Hyperlipidemia   . Cardiomyopathy   . H/O cocaine abuse   . Hypothyroidism   . Stroke     "weaker on my left side since" (03/04/2013)  . Liver abscess     measuring 7 x 9 cm/notes 03/04/2013    Past Surgical History  Procedure Laterality Date  . Appendectomy    . Inguinal hernia repair Right   . Tibia fracture surgery Left     Jarold Motto MS, RD, LDN Pager: [phone] After-hours pager: [phone]

## 2013-03-05 NOTE — H&P (Signed)
Brad Harrell is an 67 y.o. male.   Chief Complaint: N/V/ Abd pain To ED; workup reveals large liver abscess Scheduled now for liver abscess drain placement HPI: Cocaine use; HTN; HLD; CVA  Past Medical History  Diagnosis Date  . Hypertension   . Urinary retention   . Hyperlipidemia   . Cardiomyopathy   . H/O cocaine abuse   . Hypothyroidism   . Stroke     "weaker on my left side since" (03/04/2013)  . Liver abscess     measuring 7 x 9 cm/notes 03/04/2013    Past Surgical History  Procedure Laterality Date  . Appendectomy    . Inguinal hernia repair Right   . Tibia fracture surgery Left     History reviewed. No pertinent family history. Social History:  reports that he has quit smoking. His smoking use included Cigarettes. He has a 51 pack-year smoking history. He does not have any smokeless tobacco history on file. He reports that  drinks alcohol. He reports that he uses illicit drugs (Cocaine).  Allergies: No Known Allergies  Medications Prior to Admission  Medication Sig Dispense Refill  . aspirin 81 MG chewable tablet Chew 81 mg by mouth daily.      . carvedilol (COREG) 3.125 MG tablet Take 3.125 mg by mouth 2 (two) times daily with a meal.      . clopidogrel (PLAVIX) 75 MG tablet Take 75 mg by mouth daily.      Marland Kitchen doxazosin (CARDURA) 4 MG tablet Take 4 mg by mouth at bedtime.      Marland Kitchen levothyroxine (SYNTHROID, LEVOTHROID) 100 MCG tablet Take 100 mcg by mouth daily.      . simvastatin (ZOCOR) 40 MG tablet Take 40 mg by mouth every evening.        Results for orders placed during the hospital encounter of 03/04/13 (from the past 48 hour(s))  PROTIME-INR     Status: Abnormal   Collection Time    03/04/13 12:08 PM      Result Value Range   Prothrombin Time 15.3 (*) 11.6 - 15.2 seconds   INR 1.23  0.00 - 1.49  APTT     Status: None   Collection Time    03/04/13 12:08 PM      Result Value Range   aPTT 30  24 - 37 seconds  CBC     Status: Abnormal   Collection Time    03/04/13 12:08 PM      Result Value Range   WBC 13.8 (*) 4.0 - 10.5 K/uL   RBC 3.14 (*) 4.22 - 5.81 MIL/uL   Hemoglobin 8.0 (*) 13.0 - 17.0 g/dL   HCT 16.1 (*) 09.6 - 04.5 %   MCV 79.6  78.0 - 100.0 fL   MCH 25.5 (*) 26.0 - 34.0 pg   MCHC 32.0  30.0 - 36.0 g/dL   RDW 40.9  81.1 - 91.4 %   Platelets 323  150 - 400 K/uL  DIFFERENTIAL     Status: Abnormal   Collection Time    03/04/13 12:08 PM      Result Value Range   Neutrophils Relative 76  43 - 77 %   Neutro Abs 10.5 (*) 1.7 - 7.7 K/uL   Lymphocytes Relative 16  12 - 46 %   Lymphs Abs 2.1  0.7 - 4.0 K/uL   Monocytes Relative 8  3 - 12 %   Monocytes Absolute 1.1 (*) 0.1 - 1.0 K/uL   Eosinophils Relative  0  0 - 5 %   Eosinophils Absolute 0.1  0.0 - 0.7 K/uL   Basophils Relative 0  0 - 1 %   Basophils Absolute 0.0  0.0 - 0.1 K/uL  COMPREHENSIVE METABOLIC PANEL     Status: Abnormal   Collection Time    03/04/13 12:08 PM      Result Value Range   Sodium 135  135 - 145 mEq/L   Potassium 4.5  3.5 - 5.1 mEq/L   Chloride 96  96 - 112 mEq/L   CO2 26  19 - 32 mEq/L   Glucose, Bld 144 (*) 70 - 99 mg/dL   BUN 20  6 - 23 mg/dL   Creatinine, Ser 1.61  0.50 - 1.35 mg/dL   Calcium 9.7  8.4 - 09.6 mg/dL   Total Protein 9.1 (*) 6.0 - 8.3 g/dL   Albumin 2.7 (*) 3.5 - 5.2 g/dL   AST 34  0 - 37 U/L   ALT 23  0 - 53 U/L   Alkaline Phosphatase 115  39 - 117 U/L   Total Bilirubin 0.7  0.3 - 1.2 mg/dL   GFR calc non Af Amer 59 (*) >90 mL/min   GFR calc Af Amer 69 (*) >90 mL/min   Comment:            The eGFR has been calculated     using the CKD EPI equation.     This calculation has not been     validated in all clinical     situations.     eGFR's persistently     <90 mL/min signify     possible Chronic Kidney Disease.  GLUCOSE, CAPILLARY     Status: Abnormal   Collection Time    03/04/13 12:10 PM      Result Value Range   Glucose-Capillary 136 (*) 70 - 99 mg/dL   Comment 1 Notify RN    POCT I-STAT, CHEM 8     Status: Abnormal    Collection Time    03/04/13 12:55 PM      Result Value Range   Sodium 139  135 - 145 mEq/L   Potassium 4.5  3.5 - 5.1 mEq/L   Chloride 104  96 - 112 mEq/L   BUN 22  6 - 23 mg/dL   Creatinine, Ser 0.45  0.50 - 1.35 mg/dL   Glucose, Bld 409 (*) 70 - 99 mg/dL   Calcium, Ion 8.11  9.14 - 1.30 mmol/L   TCO2 28  0 - 100 mmol/L   Hemoglobin 8.5 (*) 13.0 - 17.0 g/dL   HCT 78.2 (*) 95.6 - 21.3 %  URINALYSIS, ROUTINE W REFLEX MICROSCOPIC     Status: Abnormal   Collection Time    03/04/13  2:15 PM      Result Value Range   Color, Urine AMBER (*) YELLOW   Comment: BIOCHEMICALS MAY BE AFFECTED BY COLOR   APPearance CLOUDY (*) CLEAR   Specific Gravity, Urine 1.027  1.005 - 1.030   pH 5.0  5.0 - 8.0   Glucose, UA NEGATIVE  NEGATIVE mg/dL   Hgb urine dipstick NEGATIVE  NEGATIVE   Bilirubin Urine SMALL (*) NEGATIVE   Ketones, ur 15 (*) NEGATIVE mg/dL   Protein, ur 30 (*) NEGATIVE mg/dL   Urobilinogen, UA 1.0  0.0 - 1.0 mg/dL   Nitrite NEGATIVE  NEGATIVE   Leukocytes, UA MODERATE (*) NEGATIVE  URINE MICROSCOPIC-ADD ON     Status: Abnormal   Collection  Time    03/04/13  2:15 PM      Result Value Range   Squamous Epithelial / LPF RARE  RARE   WBC, UA 3-6  <3 WBC/hpf   Bacteria, UA FEW (*) RARE   Urine-Other AMORPHOUS URATES/PHOSPHATES    AMMONIA     Status: None   Collection Time    03/04/13  7:20 PM      Result Value Range   Ammonia 23  11 - 60 umol/L  TROPONIN I     Status: None   Collection Time    03/04/13  7:37 PM      Result Value Range   Troponin I <0.30  <0.30 ng/mL   Comment:            Due to the release kinetics of cTnI,     a negative result within the first hours     of the onset of symptoms does not rule out     myocardial infarction with certainty.     If myocardial infarction is still suspected,     repeat the test at appropriate intervals.  CBC     Status: Abnormal   Collection Time    03/04/13  7:45 PM      Result Value Range   WBC 12.6 (*) 4.0 - 10.5 K/uL    RBC 3.18 (*) 4.22 - 5.81 MIL/uL   Hemoglobin 8.3 (*) 13.0 - 17.0 g/dL   HCT 16.1 (*) 09.6 - 04.5 %   MCV 79.2  78.0 - 100.0 fL   MCH 26.1  26.0 - 34.0 pg   MCHC 32.9  30.0 - 36.0 g/dL   RDW 40.9  81.1 - 91.4 %   Platelets 316  150 - 400 K/uL  MAGNESIUM     Status: None   Collection Time    03/04/13  7:45 PM      Result Value Range   Magnesium 2.5  1.5 - 2.5 mg/dL  PHOSPHORUS     Status: None   Collection Time    03/04/13  7:45 PM      Result Value Range   Phosphorus 4.1  2.3 - 4.6 mg/dL  TSH     Status: Abnormal   Collection Time    03/04/13  7:45 PM      Result Value Range   TSH 5.221 (*) 0.350 - 4.500 uIU/mL  PROTIME-INR     Status: Abnormal   Collection Time    03/04/13  7:45 PM      Result Value Range   Prothrombin Time 16.0 (*) 11.6 - 15.2 seconds   INR 1.31  0.00 - 1.49  GLUCOSE, CAPILLARY     Status: Abnormal   Collection Time    03/05/13 12:26 AM      Result Value Range   Glucose-Capillary 124 (*) 70 - 99 mg/dL  TROPONIN I     Status: None   Collection Time    03/05/13 12:54 AM      Result Value Range   Troponin I <0.30  <0.30 ng/mL   Comment:            Due to the release kinetics of cTnI,     a negative result within the first hours     of the onset of symptoms does not rule out     myocardial infarction with certainty.     If myocardial infarction is still suspected,     repeat the test at appropriate intervals.  GLUCOSE,  CAPILLARY     Status: Abnormal   Collection Time    03/05/13  6:03 AM      Result Value Range   Glucose-Capillary 118 (*) 70 - 99 mg/dL   Ct Head Wo Contrast  03/04/2013  *RADIOLOGY REPORT*  Clinical Data: Facial droop  CT HEAD WITHOUT CONTRAST  Technique:  Contiguous axial images were obtained from the base of the skull through the vertex without contrast.  Comparison: 05/01/2011  Findings: No skull fracture is noted.  Paranasal sinuses and mastoid air cells are unremarkable.  Stable cerebral atrophy. Small lacunar infarct is noted in  the right thalamus. Periventricular and subcortical white matter decreased attenuation probable due to chronic small vessel ischemic changes.  No acute infarction.  No mass lesion is noted on this unenhanced scan.  IMPRESSION: No acute intracranial abnormality.  Stable atrophy and chronic white matter disease.   Original Report Authenticated By: Natasha Mead, M.D.    Mr [name] Wo Contrast  03/04/2013  *RADIOLOGY REPORT*  Clinical Data: Evaluate for abscess.  History high blood pressure and cocaine abuse.  Right facial droop.  MRI HEAD WITHOUT AND WITH CONTRAST  Technique:  Multiplanar, multiecho pulse sequences of the brain and surrounding structures were obtained according to standard protocol without and with intravenous contrast  Contrast: 19mL MULTIHANCE GADOBENATE DIMEGLUMINE 529 MG/ML IV SOLN  Comparison: 03/04/2013 the head CT.  05/01/2011 brain MR.  Findings: No acute infarct.  No intracranial mass or findings of intracranial abscess.  Remote infarct right thalamus.  Remote infarct posterior superior right frontal lobe.  Prominent small vessel disease type changes.  Atrophy without hydrocephalus.  No intracranial hemorrhage.  Heterogeneous bone marrow may reflect changes of underlying anemia.  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  Major intracranial vascular structures are patent.  Paranasal sinus mucosal thickening.  IMPRESSION: No acute infarct or findings of intracranial abscess.  Please see above.   Original Report Authenticated By: Lacy Duverney, M.D.    Ct Abdomen Pelvis W Contrast  03/04/2013  *RADIOLOGY REPORT*  Clinical Data: Abdominal pain, lethargy.  CT ABDOMEN AND PELVIS WITH CONTRAST  Technique:  Multidetector CT imaging of the abdomen and pelvis was performed following the standard protocol during bolus administration of intravenous contrast.  Contrast: OMNIPAQUE IOHEXOL 300 MG/ML  SOLN  Comparison: None.  Findings: Linear scarring or atelectasis  posteriorly in the visualized left lower lung.  There is a complex multiloculated 7.9 cm lesion in the left hepatic lobe.  There is some mild enhancement around the central fluid attenuation components and some edematous changes in the liver parenchyma more peripherally.  No other liver lesion is evident.  Unremarkable spleen, adrenal glands, kidneys, pancreas.  Aortoiliac atheromatous calcifications and plaque without aneurysm or stenosis.  Portal vein remains patent.  The stomach, small bowel, and colon are nondilated.  Foley catheter decompresses the urinary bladder.  Prostatic enlargement with central coarse calcifications. No ascites.  No free air.  No adenopathy evident.  No hydronephrosis.  Spondylitic changes in the thoracolumbar spine most marked L3-4 and L5-S1.  IMPRESSION:  1.  Multiloculated cystic liver lesion, favor abscess over neoplasm.  This would be approachable for percutaneous drainage.   Original Report Authenticated By: D. Andria Rhein, MD    Dg Abd Acute W/chest  03/04/2013  *RADIOLOGY REPORT*  Clinical Data: Acute abdominal pain, facial droop  ACUTE ABDOMEN SERIES (ABDOMEN 2 VIEW & CHEST 1 VIEW)  Comparison: 08/15/2012  Findings: Cardiomediastinal silhouette is stable.  No  acute infiltrate or pulmonary edema.  Mild gaseous distended small bowel loops mid abdomen suspicious for ileus.  No free abdominal air.  IMPRESSION: No acute disease within chest.  Mild gaseous distended small bowel loops mid abdomen suspicious for ileus.  No free abdominal air.   Original Report Authenticated By: Natasha Mead, M.D.     Review of Systems  Constitutional: Positive for fever and weight loss.  Respiratory: Negative for hemoptysis.   Cardiovascular: Negative for chest pain.  Gastrointestinal: Positive for nausea, vomiting and abdominal pain.  Musculoskeletal: Positive for back pain.  Neurological: Positive for weakness.    Blood pressure 129/60, pulse 122, temperature 101.2 F (38.4 C), temperature  source Oral, resp. rate 20, height 5\' 4"  (1.626 m), weight 175 lb 4 oz (79.493 kg), SpO2 97.00%. Physical Exam  Constitutional: He is oriented to person, place, and time. He appears well-developed.  Cardiovascular: Normal rate, regular rhythm and normal heart sounds.   No murmur heard. Respiratory: Effort normal and breath sounds normal. He has no wheezes.  GI: Soft. Bowel sounds are normal. There is tenderness.  Musculoskeletal: Normal range of motion.  Weaker on left  Neurological: He is alert and oriented to person, place, and time.  Psychiatric: He has a normal mood and affect. His behavior is normal. Judgment normal.     Assessment/Plan 1 week hx abd pain/ n/v ER work up shows liver abscess Scheduled now for liver abscess drain placement Pt aware of procedure benefits and risks and agreeable to proceed consent signed and in chart  Eaven Schwager A 03/05/2013, 8:47 AM

## 2013-03-06 DIAGNOSIS — B957 Other staphylococcus as the cause of diseases classified elsewhere: Secondary | ICD-10-CM

## 2013-03-06 DIAGNOSIS — R7881 Bacteremia: Secondary | ICD-10-CM | POA: Diagnosis present

## 2013-03-06 LAB — GLUCOSE, CAPILLARY
Glucose-Capillary: 119 mg/dL — ABNORMAL HIGH (ref 70–99)
Glucose-Capillary: 120 mg/dL — ABNORMAL HIGH (ref 70–99)
Glucose-Capillary: 148 mg/dL — ABNORMAL HIGH (ref 70–99)

## 2013-03-06 LAB — CULTURE, BLOOD (ROUTINE X 2)

## 2013-03-06 LAB — BASIC METABOLIC PANEL
BUN: 17 mg/dL (ref 6–23)
CO2: 22 mEq/L (ref 19–32)
Calcium: 8.8 mg/dL (ref 8.4–10.5)
Creatinine, Ser: 1.19 mg/dL (ref 0.50–1.35)

## 2013-03-06 LAB — IRON AND TIBC
Iron: 10 ug/dL — ABNORMAL LOW (ref 42–135)
Saturation Ratios: 9 % — ABNORMAL LOW (ref 20–55)
TIBC: 116 ug/dL — ABNORMAL LOW (ref 215–435)

## 2013-03-06 LAB — CBC
HCT: 22.5 % — ABNORMAL LOW (ref 39.0–52.0)
MCH: 25.4 pg — ABNORMAL LOW (ref 26.0–34.0)
MCV: 78.4 fL (ref 78.0–100.0)
Platelets: 262 10*3/uL (ref 150–400)
RDW: 15.6 % — ABNORMAL HIGH (ref 11.5–15.5)

## 2013-03-06 LAB — VITAMIN B12: Vitamin B-12: 446 pg/mL (ref 211–911)

## 2013-03-06 LAB — RETICULOCYTES: Retic Ct Pct: 2 % (ref 0.4–3.1)

## 2013-03-06 LAB — HIV ANTIBODY (ROUTINE TESTING W REFLEX): HIV: NONREACTIVE

## 2013-03-06 MED ORDER — VANCOMYCIN HCL IN DEXTROSE 1-5 GM/200ML-% IV SOLN
1000.0000 mg | Freq: Two times a day (BID) | INTRAVENOUS | Status: DC
  Administered 2013-03-06: 1000 mg via INTRAVENOUS
  Filled 2013-03-06 (×2): qty 200

## 2013-03-06 MED ORDER — SACCHAROMYCES BOULARDII 250 MG PO CAPS
250.0000 mg | ORAL_CAPSULE | Freq: Two times a day (BID) | ORAL | Status: DC
  Administered 2013-03-06 – 2013-03-12 (×10): 250 mg via ORAL
  Filled 2013-03-06 (×14): qty 1

## 2013-03-06 MED ORDER — VANCOMYCIN HCL 10 G IV SOLR
1500.0000 mg | Freq: Once | INTRAVENOUS | Status: AC
  Administered 2013-03-06: 1500 mg via INTRAVENOUS
  Filled 2013-03-06: qty 1500

## 2013-03-06 MED ORDER — ENOXAPARIN SODIUM 40 MG/0.4ML ~~LOC~~ SOLN
40.0000 mg | Freq: Every day | SUBCUTANEOUS | Status: DC
  Administered 2013-03-06 – 2013-03-12 (×6): 40 mg via SUBCUTANEOUS
  Filled 2013-03-06 (×7): qty 0.4

## 2013-03-06 MED ORDER — PIPERACILLIN-TAZOBACTAM 3.375 G IVPB
3.3750 g | Freq: Three times a day (TID) | INTRAVENOUS | Status: DC
  Administered 2013-03-06 – 2013-03-08 (×4): 3.375 g via INTRAVENOUS
  Filled 2013-03-06 (×9): qty 50

## 2013-03-06 NOTE — Progress Notes (Signed)
[facility] PROGRESS NOTE  Brad Harrell ZOX:[id number] DOB: [birth year] DOA: 03/04/2013 PCP: Dorrene German, MD  Assessment/Plan: 1. Liver abscess/cyst: -IR s/p aspiration and drain 5/7, FU cultures -Blood Cx with GPC in pairs, started Vancomycin 5/8 -Will DC PRimaxin and Flagyl started by Dr.Abrol on admission -Will request ID consult  2. Metabolic encephalopathy: - due to above -improved -MRI negative for CVA  3. H/o CVA -with residual hemiparesis -continue ASA/Plavix/statin  4. Anemia: - Fu anemia panel,  - no overt blood loss, transfuse < 7 or symptomatic  DVT proph: lovenox  Code Status: full  Family Communication: none at bedside  Disposition Plan: home when improved    Consultants: IR  Antibiotics:  IV primaxin 5/6  IV flagyl 5/6    HPI/Subjective: C/o abd discomfort,   Objective: Filed Vitals:   03/05/13 1736 03/05/13 1815 03/05/13 2008 03/06/13 0435  BP: 136/64  114/60 110/65  Pulse: 141  116 94  Temp: 104.1 F (40.1 C) 103 F (39.4 C) 102.7 F (39.3 C) 98.6 F (37 C)  TempSrc: Oral Oral Oral Oral  Resp: 20  18 18   Height:      Weight:   80.8 kg (178 lb 2.1 oz)   SpO2: 97%  97% 98%    Intake/Output Summary (Last 24 hours) at 03/06/13 0738 Last data filed at 03/06/13 0501  Gross per 24 hour  Intake   1205 ml  Output    760 ml  Net    445 ml   Filed Weights   03/04/13 2140 03/05/13 2008  Weight: 79.493 kg (175 lb 4 oz) 80.8 kg (178 lb 2.1 oz)    Exam:   General:  AAO to self and place  Cardiovascular: S1S2/RRR  Respiratory: CTAB  Abdomen: soft, mild diffuse tenderness , drain noted slightly distended, BS present  Ext: mild residual L leg weakness   Data Reviewed: Basic Metabolic Panel:  Recent Labs Lab 03/04/13 1208 03/04/13 1255 03/04/13 1945 03/05/13 0801 03/06/13 0435  NA 135 139  --  134* 138  K 4.5 4.5  --  4.3 4.6  CL 96 104  --  100 102  CO2 26  --   --  25 22  GLUCOSE 144* 144*  --  120* 131*   BUN 20 22  --  15 17  CREATININE 1.21 1.20  --  1.03 1.19  CALCIUM 9.7  --   --  8.9 8.8  MG  --   --  2.5  --   --   PHOS  --   --  4.1  --   --    Liver Function Tests:  Recent Labs Lab 03/04/13 1208 03/05/13 0801  AST 34 24  ALT 23 19  ALKPHOS 115 103  BILITOT 0.7 0.8  PROT 9.1* 7.7  ALBUMIN 2.7* 2.2*   No results found for this basename: LIPASE, AMYLASE,  in the last 168 hours  Recent Labs Lab 03/04/13 1920  AMMONIA 23   CBC:  Recent Labs Lab 03/04/13 1208 03/04/13 1255 03/04/13 1945 03/05/13 0801 03/06/13 0435  WBC 13.8*  --  12.6* 10.6* 11.5*  NEUTROABS 10.5*  --   --   --   --   HGB 8.0* 8.5* 8.3* 7.1* 7.3*  HCT 25.0* 25.0* 25.2* 21.6* 22.5*  MCV 79.6  --  79.2 78.5 78.4  PLT 323  --  316 252 262   Cardiac Enzymes:  Recent Labs Lab 03/04/13 1937 03/05/13 0054  TROPONINI <0.30 <0.30  BNP (last 3 results) No results found for this basename: PROBNP,  in the last 8760 hours CBG:  Recent Labs Lab 03/05/13 0026 03/05/13 0603 03/05/13 1220 03/05/13 1725 03/06/13 0002  GLUCAP 124* 118* 121* 139* 141*    Recent Results (from the past 240 hour(s))  CULTURE, BLOOD (ROUTINE X 2)     Status: None   Collection Time    03/04/13  7:45 PM      Result Value Range Status   Specimen Description BLOOD HAND LEFT   Final   Special Requests BOTTLES DRAWN AEROBIC AND ANAEROBIC 10CC   Final   Culture  Setup Time 03/05/2013 01:19   Final   Culture     Final   Value: GRAM POSITIVE COCCI IN PAIRS     Note: Gram Stain Report Called to,Read Back By and Verified With: TINA ISAACS ON 03/06/2013 AT 12:40A BY WILEJ   Report Status PENDING   Incomplete  CULTURE, ROUTINE-ABSCESS     Status: None   Collection Time    03/05/13 11:28 AM      Result Value Range Status   Specimen Description ABSCESS LIVER   Final   Special Requests NONE   Final   Gram Stain PENDING   Incomplete   Culture NO GROWTH 1 DAY   Final   Report Status PENDING   Incomplete     Studies: Ct  Head Wo Contrast  03/04/2013  *RADIOLOGY REPORT*  Clinical Data: Facial droop  CT HEAD WITHOUT CONTRAST  Technique:  Contiguous axial images were obtained from the base of the skull through the vertex without contrast.  Comparison: 05/01/2011  Findings: No skull fracture is noted.  Paranasal sinuses and mastoid air cells are unremarkable.  Stable cerebral atrophy. Small lacunar infarct is noted in the right thalamus. Periventricular and subcortical white matter decreased attenuation probable due to chronic small vessel ischemic changes.  No acute infarction.  No mass lesion is noted on this unenhanced scan.  IMPRESSION: No acute intracranial abnormality.  Stable atrophy and chronic white matter disease.   Original Report Authenticated By: Natasha Mead, M.D.    Mr [name] Wo Contrast  03/04/2013  *RADIOLOGY REPORT*  Clinical Data: Evaluate for abscess.  History high blood pressure and cocaine abuse.  Right facial droop.  MRI HEAD WITHOUT AND WITH CONTRAST  Technique:  Multiplanar, multiecho pulse sequences of the brain and surrounding structures were obtained according to standard protocol without and with intravenous contrast  Contrast: 19mL MULTIHANCE GADOBENATE DIMEGLUMINE 529 MG/ML IV SOLN  Comparison: 03/04/2013 the head CT.  05/01/2011 brain MR.  Findings: No acute infarct.  No intracranial mass or findings of intracranial abscess.  Remote infarct right thalamus.  Remote infarct posterior superior right frontal lobe.  Prominent small vessel disease type changes.  Atrophy without hydrocephalus.  No intracranial hemorrhage.  Heterogeneous bone marrow may reflect changes of underlying anemia.  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  Major intracranial vascular structures are patent.  Paranasal sinus mucosal thickening.  IMPRESSION: No acute infarct or findings of intracranial abscess.  Please see above.   Original Report Authenticated By: Lacy Duverney, M.D.    Ct Abdomen Pelvis W  Contrast  03/04/2013  *RADIOLOGY REPORT*  Clinical Data: Abdominal pain, lethargy.  CT ABDOMEN AND PELVIS WITH CONTRAST  Technique:  Multidetector CT imaging of the abdomen and pelvis was performed following the standard protocol during bolus administration of intravenous contrast.  Contrast: OMNIPAQUE IOHEXOL 300 MG/ML  SOLN  Comparison: None.  Findings: Linear scarring or atelectasis posteriorly in the visualized left lower lung.  There is a complex multiloculated 7.9 cm lesion in the left hepatic lobe.  There is some mild enhancement around the central fluid attenuation components and some edematous changes in the liver parenchyma more peripherally.  No other liver lesion is evident.  Unremarkable spleen, adrenal glands, kidneys, pancreas.  Aortoiliac atheromatous calcifications and plaque without aneurysm or stenosis.  Portal vein remains patent.  The stomach, small bowel, and colon are nondilated.  Foley catheter decompresses the urinary bladder.  Prostatic enlargement with central coarse calcifications. No ascites.  No free air.  No adenopathy evident.  No hydronephrosis.  Spondylitic changes in the thoracolumbar spine most marked L3-4 and L5-S1.  IMPRESSION:  1.  Multiloculated cystic liver lesion, favor abscess over neoplasm.  This would be approachable for percutaneous drainage.   Original Report Authenticated By: D. Andria Rhein, MD    Ir Era Skeen W Catheter Placement  03/05/2013  *RADIOLOGY REPORT*  Clinical Data: Left hepatic abscess, polysubstance abuse  ULTRASOUND LEFT HEPATIC ABSCESS DRAIN INSERTION  Date:  03/05/2013 10:40:00  Radiologist:  M. Ruel Favors, M.D.  Medications:  2 mg Versed, 50 mcg Fentanyl  Guidance:  Ultrasound  Fluoroscopy time:  None.  Sedation time:  11 minutes  Contrast volume:  None.  Complications:  No immediate  PROCEDURE/FINDINGS:  Informed consent was obtained from the patient following explanation of the procedure, risks, benefits and alternatives. The patient  understands, agrees and consents for the procedure. All questions were addressed.  A time out was performed.  Maximal barrier sterile technique utilized including caps, mask, sterile gowns, sterile gloves, large sterile drape, hand hygiene, and betadine  Previous imaging reviewed.  Preliminary ultrasound performed of the left hepatic lobe from a sub xyphoid approach.  The heterogeneous fluid collection in the left hepatic lobe lateral segment was localized.  Under sterile conditions and local anesthesia, an 18 gauge 15 cm access needle was advanced percutaneously into the left hepatic abscess.  Syringe aspiration yielded exudative fluid. Sample sent for Gram stain and culture.  Guide wire inserted followed by tract dilatation to insert 10-French drain.  Syringe aspiration yielded 30 ml additional exudative fluid.  Catheter secured with a Prolene suture and connected to external suction bulb.  Sterile dressing applied.  No immediate complication.  The patient tolerated the procedure well.  IMPRESSION: Successful ultrasound left hepatic abscess drain insertion   Original Report Authenticated By: Judie Petit. Shick, M.D.    Dg Abd Acute W/chest  03/04/2013  *RADIOLOGY REPORT*  Clinical Data: Acute abdominal pain, facial droop  ACUTE ABDOMEN SERIES (ABDOMEN 2 VIEW & CHEST 1 VIEW)  Comparison: 08/15/2012  Findings: Cardiomediastinal silhouette is stable.  No acute infiltrate or pulmonary edema.  Mild gaseous distended small bowel loops mid abdomen suspicious for ileus.  No free abdominal air.  IMPRESSION: No acute disease within chest.  Mild gaseous distended small bowel loops mid abdomen suspicious for ileus.  No free abdominal air.   Original Report Authenticated By: Natasha Mead, M.D.     Scheduled Meds: . carvedilol  6.25 mg Oral BID WC  . docusate sodium  100 mg Oral BID  . saccharomyces boulardii  250 mg Oral BID  . senna  1 tablet Oral BID  . sodium chloride  3 mL Intravenous Q12H  . vancomycin  1,000 mg Intravenous  Q12H   Continuous Infusions: . sodium chloride 75 mL/hr (03/04/13 1857)    Active Problems:  Cardiomyopathy   H/O cocaine abuse   Stroke   Liver abscess   Encephalopathy, metabolic   Anemia    Time spent:    [facility]  [facility] Pager [phone]. If 7PM-7AM, please contact night-coverage at [email or web address], password [email or web address] 03/06/2013, 7:38 AM  LOS: 2 days

## 2013-03-06 NOTE — Progress Notes (Signed)
Utilization review completed.  

## 2013-03-06 NOTE — Progress Notes (Signed)
ANTIBIOTIC CONSULT NOTE - INITIAL  Pharmacy Consult for Vancomycin Indication: bactermia No Known Allergies  Patient Measurements: Height: 5\' 4"  (162.6 cm) Weight: 178 lb 2.1 oz (80.8 kg) IBW/kg (Calculated) : 59.2  Vital Signs: Temp: 102.7 F (39.3 C) (05/07 2008) Temp src: Oral (05/07 2008) BP: 114/60 mmHg (05/07 2008) Pulse Rate: 116 (05/07 2008) Intake/Output from previous day: 05/07 0701 - 05/08 0700 In: 605 [IV Piggyback:600] Out: 530 [Urine:450; Drains:80] Intake/Output from this shift: Total I/O In: -  Out: 450 [Urine:450]  Labs:  Recent Labs  03/04/13 1208 03/04/13 1255 03/04/13 1945 03/05/13 0801  WBC 13.8*  --  12.6* 10.6*  HGB 8.0* 8.5* 8.3* 7.1*  PLT 323  --  316 252  CREATININE 1.21 1.20  --  1.03   Estimated Creatinine Clearance: 64.9 ml/min (by C-G formula based on Cr of 1.03). No results found for this basename: VANCOTROUGH, VANCOPEAK, VANCORANDOM, GENTTROUGH, GENTPEAK, GENTRANDOM, TOBRATROUGH, TOBRAPEAK, TOBRARND, AMIKACINPEAK, AMIKACINTROU, AMIKACIN,  in the last 72 hours   Microbiology: Recent Results (from the past 720 hour(s))  CULTURE, BLOOD (ROUTINE X 2)     Status: None   Collection Time    03/04/13  7:45 PM      Result Value Range Status   Specimen Description BLOOD HAND LEFT   Final   Special Requests BOTTLES DRAWN AEROBIC AND ANAEROBIC 10CC   Final   Culture  Setup Time 03/05/2013 01:19   Final   Culture     Final   Value: GRAM POSITIVE COCCI IN PAIRS     Note: Gram Stain Report Called to,Read Back By and Verified With: TINA ISAACS ON 03/06/2013 AT 12:40A BY WILEJ   Report Status PENDING   Incomplete    Medical History: Past Medical History  Diagnosis Date  . Hypertension   . Urinary retention   . Hyperlipidemia   . Cardiomyopathy   . H/O cocaine abuse   . Hypothyroidism   . Stroke     "weaker on my left side since" (03/04/2013)  . Liver abscess     measuring 7 x 9 cm/notes 03/04/2013    Medications:  Scheduled:  .  [EXPIRED] sodium chloride   Intravenous STAT  . carvedilol  6.25 mg Oral BID WC  . docusate sodium  100 mg Oral BID  . [COMPLETED] fentaNYL      . imipenem-cilastatin  500 mg Intravenous Q8H  . [COMPLETED] metoprolol  10 mg Intravenous NOW  . metronidazole  500 mg Intravenous Q8H  . [COMPLETED] midazolam      . senna  1 tablet Oral BID  . sodium chloride  3 mL Intravenous Q12H  . [DISCONTINUED] enoxaparin (LOVENOX) injection  40 mg Subcutaneous Q24H  . [DISCONTINUED] enoxaparin (LOVENOX) injection  40 mg Subcutaneous Q24H   Assessment: 67 yo male with positive blood cultures for empiric antibiotics  Goal of Therapy:  Vancomycin trough level 15-20 mcg/ml  Plan:  Vancomycin 1500 mg IV now, then 1 g IV q12h  Eddie Candle 03/06/2013,1:25 AM

## 2013-03-06 NOTE — Progress Notes (Signed)
Pt has only had 250 ml urine output since 8am. MD aware. Will continue to monitor.

## 2013-03-06 NOTE — Consult Note (Signed)
INFECTIOUS DISEASE CONSULT NOTE  Date of Admission:  03/04/2013  Date of Consult:  03/06/2013  Reason for Consult: Liver abscess, Bacteremia Referring Physician: Jomarie Longs  Impression/Recommendation Liver Abscess Bacteremia- Coag Negative Staph  Would- Stop vanco Start zosyn Await  Check amoeba serologies Check HIV and Hep C Recheck BCx   Comment- typically liver abscess comes from GI pathology (diverticulae). His CT scan does not list this.  Amoeba would be unusual for someone who lives in [place] and has no travel hx. Regard CNS in BCx as contaminant.  Thank you so much for this interesting consult,   Johny Sax [phone]  LARY ECKARDT is an 67 y.o. male.  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 Presents to [facility] on 5-6 with 1 week of n/v and constipation. States that he had fever, chills at home. Did not take his temp. He was also noted to have R sided facial droop. His MRI head was (-) for acute change. He was found on his abd CT to have a 7.9 cm liver abscess.  He was started on imipenem. Underwent IR drain and pus was aspirated. Cx pending. In hospital he has had temp up to 103.  Has chronic foley (and prev episode of urosepsis) after his CVA.  Has city water, no travel outside [place], no recent unusual or undercooked food.   Past Medical History  Diagnosis Date  . Hypertension   . Urinary retention   . Hyperlipidemia   . Cardiomyopathy   . H/O cocaine abuse   . Hypothyroidism   . Stroke     "weaker on my left side since" (03/04/2013)  . Liver abscess     measuring 7 x 9 cm/notes 03/04/2013    Past Surgical History  Procedure Laterality Date  . Appendectomy    . Inguinal hernia repair Right   . Tibia fracture surgery Left      No Known Allergies  Antibiotics Given (last 72 hours)   Date/Time Action Medication Dose Rate   03/04/13 2346 Given   imipenem-cilastatin  (PRIMAXIN) 500 mg in sodium chloride 0.9 % 100 mL IVPB 500 mg 200 mL/hr   03/05/13 0737 Given   imipenem-cilastatin (PRIMAXIN) 500 mg in sodium chloride 0.9 % 100 mL IVPB 500 mg 200 mL/hr   03/05/13 1457 Given   imipenem-cilastatin (PRIMAXIN) 500 mg in sodium chloride 0.9 % 100 mL IVPB 500 mg 200 mL/hr   03/05/13 2130 Given   imipenem-cilastatin (PRIMAXIN) 500 mg in sodium chloride 0.9 % 100 mL IVPB 500 mg 200 mL/hr   03/06/13 0208 Given   vancomycin (VANCOCIN) 1,500 mg in sodium chloride 0.9 % 500 mL IVPB 1,500 mg 250 mL/hr   03/06/13 0636 Given   imipenem-cilastatin (PRIMAXIN) 500 mg in sodium chloride 0.9 % 100 mL IVPB 500 mg 200 mL/hr   03/06/13 1029 Given   vancomycin (VANCOCIN) IVPB 1000 mg/200 mL premix 1,000 mg 200 mL/hr      Medications:  Scheduled: . carvedilol  6.25 mg Oral BID WC  . docusate sodium  100 mg Oral BID  . enoxaparin (LOVENOX) injection  40 mg Subcutaneous Daily  . saccharomyces boulardii  250 mg Oral BID  . senna  1 tablet Oral BID  . sodium chloride  3 mL Intravenous Q12H  . vancomycin  1,000 mg Intravenous Q12H    Total days of antibiotics: 3 (imipenem --> vanco)  Social History:  reports  that he has quit smoking. His smoking use included Cigarettes. He has a 51 pack-year smoking history. He does not have any smokeless tobacco history on file. He reports that  drinks alcohol. He reports that he uses illicit drugs (Cocaine).  History reviewed. No pertinent family history.  General ROS: see HPI. denies allergies. has had fever. R eye blind post CVA.   Blood pressure 122/64, pulse 95, temperature 98.9 F (37.2 C), temperature source Oral, resp. rate 20, height 5\' 4"  (1.626 m), weight 80.8 kg (178 lb 2.1 oz), SpO2 97.00%. General appearance: alert, cooperative and no distress Head: R facial droop Eyes: R eye cloudy Throat: poor dentition.  Neck: no adenopathy and supple, symmetrical, trachea midline Lungs: clear to auscultation bilaterally Heart:  regular rate and rhythm Abdomen: normal findings: bowel sounds normal and soft and abnormal findings:  distended and diffuse tenderness, no gaurding.  Extremities: edema none   Results for orders placed during the hospital encounter of 03/04/13 (from the past 48 hour(s))  URINALYSIS, ROUTINE W REFLEX MICROSCOPIC     Status: Abnormal   Collection Time    03/04/13  2:15 PM      Result Value Range   Color, Urine AMBER (*) YELLOW   Comment: BIOCHEMICALS MAY BE AFFECTED BY COLOR   APPearance CLOUDY (*) CLEAR   Specific Gravity, Urine 1.027  1.005 - 1.030   pH 5.0  5.0 - 8.0   Glucose, UA NEGATIVE  NEGATIVE mg/dL   Hgb urine dipstick NEGATIVE  NEGATIVE   Bilirubin Urine SMALL (*) NEGATIVE   Ketones, ur 15 (*) NEGATIVE mg/dL   Protein, ur 30 (*) NEGATIVE mg/dL   Urobilinogen, UA 1.0  0.0 - 1.0 mg/dL   Nitrite NEGATIVE  NEGATIVE   Leukocytes, UA MODERATE (*) NEGATIVE  URINE MICROSCOPIC-ADD ON     Status: Abnormal   Collection Time    03/04/13  2:15 PM      Result Value Range   Squamous Epithelial / LPF RARE  RARE   WBC, UA 3-6  <3 WBC/hpf   Bacteria, UA FEW (*) RARE   Urine-Other AMORPHOUS URATES/PHOSPHATES    AMMONIA     Status: None   Collection Time    03/04/13  7:20 PM      Result Value Range   Ammonia 23  11 - 60 umol/L  CULTURE, BLOOD (ROUTINE X 2)     Status: None   Collection Time    03/04/13  7:30 PM      Result Value Range   Specimen Description BLOOD ARM LEFT     Special Requests BOTTLES DRAWN AEROBIC AND ANAEROBIC 10CC     Culture  Setup Time 03/05/2013 01:20     Culture       Value:        BLOOD CULTURE RECEIVED NO GROWTH TO DATE CULTURE WILL BE HELD FOR 5 DAYS BEFORE ISSUING A FINAL NEGATIVE REPORT   Report Status PENDING    TROPONIN I     Status: None   Collection Time    03/04/13  7:37 PM      Result Value Range   Troponin I <0.30  <0.30 ng/mL   Comment:            Due to the release kinetics of cTnI,     a negative result within the first hours     of  the onset of symptoms does not rule out     myocardial infarction with certainty.  If myocardial infarction is still suspected,     repeat the test at appropriate intervals.  CULTURE, BLOOD (ROUTINE X 2)     Status: None   Collection Time    03/04/13  7:45 PM      Result Value Range   Specimen Description BLOOD HAND LEFT     Special Requests BOTTLES DRAWN AEROBIC AND ANAEROBIC 10CC     Culture  Setup Time 03/05/2013 01:19     Culture       Value: STAPHYLOCOCCUS SPECIES (COAGULASE NEGATIVE)     Note: THE SIGNIFICANCE OF ISOLATING THIS ORGANISM FROM A SINGLE SET OF BLOOD CULTURES WHEN MULTIPLE SETS ARE DRAWN IS UNCERTAIN. PLEASE NOTIFY THE MICROBIOLOGY DEPARTMENT WITHIN ONE WEEK IF SPECIATION AND SENSITIVITIES ARE REQUIRED.     Note: Gram Stain Report Called to,Read Back By and Verified With: TINA ISAACS ON 03/06/2013 AT 12:40A BY WILEJ   Report Status 03/06/2013 FINAL    CBC     Status: Abnormal   Collection Time    03/04/13  7:45 PM      Result Value Range   WBC 12.6 (*) 4.0 - 10.5 K/uL   RBC 3.18 (*) 4.22 - 5.81 MIL/uL   Hemoglobin 8.3 (*) 13.0 - 17.0 g/dL   HCT 16.1 (*) 09.6 - 04.5 %   MCV 79.2  78.0 - 100.0 fL   MCH 26.1  26.0 - 34.0 pg   MCHC 32.9  30.0 - 36.0 g/dL   RDW 40.9  81.1 - 91.4 %   Platelets 316  150 - 400 K/uL  MAGNESIUM     Status: None   Collection Time    03/04/13  7:45 PM      Result Value Range   Magnesium 2.5  1.5 - 2.5 mg/dL  PHOSPHORUS     Status: None   Collection Time    03/04/13  7:45 PM      Result Value Range   Phosphorus 4.1  2.3 - 4.6 mg/dL  TSH     Status: Abnormal   Collection Time    03/04/13  7:45 PM      Result Value Range   TSH 5.221 (*) 0.350 - 4.500 uIU/mL  PROTIME-INR     Status: Abnormal   Collection Time    03/04/13  7:45 PM      Result Value Range   Prothrombin Time 16.0 (*) 11.6 - 15.2 seconds   INR 1.31  0.00 - 1.49  GLUCOSE, CAPILLARY     Status: Abnormal   Collection Time    03/05/13 12:26 AM      Result Value Range     Glucose-Capillary 124 (*) 70 - 99 mg/dL  TROPONIN I     Status: None   Collection Time    03/05/13 12:54 AM      Result Value Range   Troponin I <0.30  <0.30 ng/mL   Comment:            Due to the release kinetics of cTnI,     a negative result within the first hours     of the onset of symptoms does not rule out     myocardial infarction with certainty.     If myocardial infarction is still suspected,     repeat the test at appropriate intervals.  GLUCOSE, CAPILLARY     Status: Abnormal   Collection Time    03/05/13  6:03 AM      Result Value Range   Glucose-Capillary  118 (*) 70 - 99 mg/dL  COMPREHENSIVE METABOLIC PANEL     Status: Abnormal   Collection Time    03/05/13  8:01 AM      Result Value Range   Sodium 134 (*) 135 - 145 mEq/L   Potassium 4.3  3.5 - 5.1 mEq/L   Chloride 100  96 - 112 mEq/L   CO2 25  19 - 32 mEq/L   Glucose, Bld 120 (*) 70 - 99 mg/dL   BUN 15  6 - 23 mg/dL   Creatinine, Ser 1.61  0.50 - 1.35 mg/dL   Calcium 8.9  8.4 - 09.6 mg/dL   Total Protein 7.7  6.0 - 8.3 g/dL   Albumin 2.2 (*) 3.5 - 5.2 g/dL   AST 24  0 - 37 U/L   ALT 19  0 - 53 U/L   Alkaline Phosphatase 103  39 - 117 U/L   Total Bilirubin 0.8  0.3 - 1.2 mg/dL   GFR calc non Af Amer 72 (*) >90 mL/min   GFR calc Af Amer 84 (*) >90 mL/min   Comment:            The eGFR has been calculated     using the CKD EPI equation.     This calculation has not been     validated in all clinical     situations.     eGFR's persistently     <90 mL/min signify     possible Chronic Kidney Disease.  CBC     Status: Abnormal   Collection Time    03/05/13  8:01 AM      Result Value Range   WBC 10.6 (*) 4.0 - 10.5 K/uL   RBC 2.75 (*) 4.22 - 5.81 MIL/uL   Hemoglobin 7.1 (*) 13.0 - 17.0 g/dL   HCT 04.5 (*) 40.9 - 81.1 %   MCV 78.5  78.0 - 100.0 fL   MCH 25.8 (*) 26.0 - 34.0 pg   MCHC 32.9  30.0 - 36.0 g/dL   RDW 91.4 (*) 78.2 - 95.6 %   Platelets 252  150 - 400 K/uL  ANAEROBIC CULTURE     Status:  None   Collection Time    03/05/13 11:28 AM      Result Value Range   Specimen Description ABSCESS LIVER     Special Requests NONE     Gram Stain PENDING     Culture       Value: NO ANAEROBES ISOLATED; CULTURE IN PROGRESS FOR 5 DAYS   Report Status PENDING    CULTURE, ROUTINE-ABSCESS     Status: None   Collection Time    03/05/13 11:28 AM      Result Value Range   Specimen Description ABSCESS LIVER     Special Requests NONE     Gram Stain PENDING     Culture NO GROWTH 1 DAY     Report Status PENDING    GLUCOSE, CAPILLARY     Status: Abnormal   Collection Time    03/05/13 12:20 PM      Result Value Range   Glucose-Capillary 121 (*) 70 - 99 mg/dL   Comment 1 Documented in Chart     Comment 2 Notify RN    GLUCOSE, CAPILLARY     Status: Abnormal   Collection Time    03/05/13  5:25 PM      Result Value Range   Glucose-Capillary 139 (*) 70 - 99 mg/dL   Comment  1 Documented in Chart     Comment 2 Notify RN    GLUCOSE, CAPILLARY     Status: Abnormal   Collection Time    03/06/13 12:02 AM      Result Value Range   Glucose-Capillary 141 (*) 70 - 99 mg/dL  CBC     Status: Abnormal   Collection Time    03/06/13  4:35 AM      Result Value Range   WBC 11.5 (*) 4.0 - 10.5 K/uL   RBC 2.87 (*) 4.22 - 5.81 MIL/uL   Hemoglobin 7.3 (*) 13.0 - 17.0 g/dL   HCT 16.1 (*) 09.6 - 04.5 %   MCV 78.4  78.0 - 100.0 fL   MCH 25.4 (*) 26.0 - 34.0 pg   MCHC 32.4  30.0 - 36.0 g/dL   RDW 40.9 (*) 81.1 - 91.4 %   Platelets 262  150 - 400 K/uL  BASIC METABOLIC PANEL     Status: Abnormal   Collection Time    03/06/13  4:35 AM      Result Value Range   Sodium 138  135 - 145 mEq/L   Potassium 4.6  3.5 - 5.1 mEq/L   Chloride 102  96 - 112 mEq/L   CO2 22  19 - 32 mEq/L   Glucose, Bld 131 (*) 70 - 99 mg/dL   BUN 17  6 - 23 mg/dL   Creatinine, Ser 7.82  0.50 - 1.35 mg/dL   Calcium 8.8  8.4 - 95.6 mg/dL   GFR calc non Af Amer 61 (*) >90 mL/min   GFR calc Af Amer 70 (*) >90 mL/min   Comment:             The eGFR has been calculated     using the CKD EPI equation.     This calculation has not been     validated in all clinical     situations.     eGFR's persistently     <90 mL/min signify     possible Chronic Kidney Disease.  RETICULOCYTES     Status: Abnormal   Collection Time    03/06/13  9:45 AM      Result Value Range   Retic Ct Pct 2.0  0.4 - 3.1 %   RBC. 2.71 (*) 4.22 - 5.81 MIL/uL   Retic Count, Manual 54.2  19.0 - 186.0 K/uL  GLUCOSE, CAPILLARY     Status: Abnormal   Collection Time    03/06/13 11:57 AM      Result Value Range   Glucose-Capillary 148 (*) 70 - 99 mg/dL      Component Value Date/Time   SDES ABSCESS LIVER 03/05/2013 1128   SDES ABSCESS LIVER 03/05/2013 1128   SPECREQUEST NONE 03/05/2013 1128   SPECREQUEST NONE 03/05/2013 1128   CULT NO ANAEROBES ISOLATED; CULTURE IN PROGRESS FOR 5 DAYS 03/05/2013 1128   CULT NO GROWTH 1 DAY 03/05/2013 1128   REPTSTATUS PENDING 03/05/2013 1128   REPTSTATUS PENDING 03/05/2013 1128   Ct Head Wo Contrast  03/04/2013  *RADIOLOGY REPORT*  Clinical Data: Facial droop  CT HEAD WITHOUT CONTRAST  Technique:  Contiguous axial images were obtained from the base of the skull through the vertex without contrast.  Comparison: 05/01/2011  Findings: No skull fracture is noted.  Paranasal sinuses and mastoid air cells are unremarkable.  Stable cerebral atrophy. Small lacunar infarct is noted in the right thalamus. Periventricular and subcortical white matter decreased attenuation probable due to chronic  small vessel ischemic changes.  No acute infarction.  No mass lesion is noted on this unenhanced scan.  IMPRESSION: No acute intracranial abnormality.  Stable atrophy and chronic white matter disease.   Original Report Authenticated By: Natasha Mead, M.D.    Mr [name] Wo Contrast  03/04/2013  *RADIOLOGY REPORT*  Clinical Data: Evaluate for abscess.  History high blood pressure and cocaine abuse.  Right facial droop.  MRI HEAD WITHOUT AND WITH CONTRAST   Technique:  Multiplanar, multiecho pulse sequences of the brain and surrounding structures were obtained according to standard protocol without and with intravenous contrast  Contrast: 19mL MULTIHANCE GADOBENATE DIMEGLUMINE 529 MG/ML IV SOLN  Comparison: 03/04/2013 the head CT.  05/01/2011 brain MR.  Findings: No acute infarct.  No intracranial mass or findings of intracranial abscess.  Remote infarct right thalamus.  Remote infarct posterior superior right frontal lobe.  Prominent small vessel disease type changes.  Atrophy without hydrocephalus.  No intracranial hemorrhage.  Heterogeneous bone marrow may reflect changes of underlying anemia.  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  Major intracranial vascular structures are patent.  Paranasal sinus mucosal thickening.  IMPRESSION: No acute infarct or findings of intracranial abscess.  Please see above.   Original Report Authenticated By: Lacy Duverney, M.D.    Ct Abdomen Pelvis W Contrast  03/04/2013  *RADIOLOGY REPORT*  Clinical Data: Abdominal pain, lethargy.  CT ABDOMEN AND PELVIS WITH CONTRAST  Technique:  Multidetector CT imaging of the abdomen and pelvis was performed following the standard protocol during bolus administration of intravenous contrast.  Contrast: OMNIPAQUE IOHEXOL 300 MG/ML  SOLN  Comparison: None.  Findings: Linear scarring or atelectasis posteriorly in the visualized left lower lung.  There is a complex multiloculated 7.9 cm lesion in the left hepatic lobe.  There is some mild enhancement around the central fluid attenuation components and some edematous changes in the liver parenchyma more peripherally.  No other liver lesion is evident.  Unremarkable spleen, adrenal glands, kidneys, pancreas.  Aortoiliac atheromatous calcifications and plaque without aneurysm or stenosis.  Portal vein remains patent.  The stomach, small bowel, and colon are nondilated.  Foley catheter decompresses the urinary  bladder.  Prostatic enlargement with central coarse calcifications. No ascites.  No free air.  No adenopathy evident.  No hydronephrosis.  Spondylitic changes in the thoracolumbar spine most marked L3-4 and L5-S1.  IMPRESSION:  1.  Multiloculated cystic liver lesion, favor abscess over neoplasm.  This would be approachable for percutaneous drainage.   Original Report Authenticated By: D. Andria Rhein, MD    Ir Era Skeen W Catheter Placement  03/05/2013  *RADIOLOGY REPORT*  Clinical Data: Left hepatic abscess, polysubstance abuse  ULTRASOUND LEFT HEPATIC ABSCESS DRAIN INSERTION  Date:  03/05/2013 10:40:00  Radiologist:  M. Ruel Favors, M.D.  Medications:  2 mg Versed, 50 mcg Fentanyl  Guidance:  Ultrasound  Fluoroscopy time:  None.  Sedation time:  11 minutes  Contrast volume:  None.  Complications:  No immediate  PROCEDURE/FINDINGS:  Informed consent was obtained from the patient following explanation of the procedure, risks, benefits and alternatives. The patient understands, agrees and consents for the procedure. All questions were addressed.  A time out was performed.  Maximal barrier sterile technique utilized including caps, mask, sterile gowns, sterile gloves, large sterile drape, hand hygiene, and betadine  Previous imaging reviewed.  Preliminary ultrasound performed of the left hepatic lobe from a sub xyphoid approach.  The heterogeneous fluid collection in the left hepatic  lobe lateral segment was localized.  Under sterile conditions and local anesthesia, an 18 gauge 15 cm access needle was advanced percutaneously into the left hepatic abscess.  Syringe aspiration yielded exudative fluid. Sample sent for Gram stain and culture.  Guide wire inserted followed by tract dilatation to insert 10-French drain.  Syringe aspiration yielded 30 ml additional exudative fluid.  Catheter secured with a Prolene suture and connected to external suction bulb.  Sterile dressing applied.  No immediate complication.  The  patient tolerated the procedure well.  IMPRESSION: Successful ultrasound left hepatic abscess drain insertion   Original Report Authenticated By: Judie Petit. Shick, M.D.    Dg Abd Acute W/chest  03/04/2013  *RADIOLOGY REPORT*  Clinical Data: Acute abdominal pain, facial droop  ACUTE ABDOMEN SERIES (ABDOMEN 2 VIEW & CHEST 1 VIEW)  Comparison: 08/15/2012  Findings: Cardiomediastinal silhouette is stable.  No acute infiltrate or pulmonary edema.  Mild gaseous distended small bowel loops mid abdomen suspicious for ileus.  No free abdominal air.  IMPRESSION: No acute disease within chest.  Mild gaseous distended small bowel loops mid abdomen suspicious for ileus.  No free abdominal air.   Original Report Authenticated By: Natasha Mead, M.D.    Recent Results (from the past 240 hour(s))  CULTURE, BLOOD (ROUTINE X 2)     Status: None   Collection Time    03/04/13  7:30 PM      Result Value Range Status   Specimen Description BLOOD ARM LEFT   Final   Special Requests BOTTLES DRAWN AEROBIC AND ANAEROBIC 10CC   Final   Culture  Setup Time 03/05/2013 01:20   Final   Culture     Final   Value:        BLOOD CULTURE RECEIVED NO GROWTH TO DATE CULTURE WILL BE HELD FOR 5 DAYS BEFORE ISSUING A FINAL NEGATIVE REPORT   Report Status PENDING   Incomplete  CULTURE, BLOOD (ROUTINE X 2)     Status: None   Collection Time    03/04/13  7:45 PM      Result Value Range Status   Specimen Description BLOOD HAND LEFT   Final   Special Requests BOTTLES DRAWN AEROBIC AND ANAEROBIC 10CC   Final   Culture  Setup Time 03/05/2013 01:19   Final   Culture     Final   Value: STAPHYLOCOCCUS SPECIES (COAGULASE NEGATIVE)     Note: THE SIGNIFICANCE OF ISOLATING THIS ORGANISM FROM A SINGLE SET OF BLOOD CULTURES WHEN MULTIPLE SETS ARE DRAWN IS UNCERTAIN. PLEASE NOTIFY THE MICROBIOLOGY DEPARTMENT WITHIN ONE WEEK IF SPECIATION AND SENSITIVITIES ARE REQUIRED.     Note: Gram Stain Report Called to,Read Back By and Verified With: TINA ISAACS ON  03/06/2013 AT 12:40A BY WILEJ   Report Status 03/06/2013 FINAL   Final  ANAEROBIC CULTURE     Status: None   Collection Time    03/05/13 11:28 AM      Result Value Range Status   Specimen Description ABSCESS LIVER   Final   Special Requests NONE   Final   Gram Stain PENDING   Incomplete   Culture     Final   Value: NO ANAEROBES ISOLATED; CULTURE IN PROGRESS FOR 5 DAYS   Report Status PENDING   Incomplete  CULTURE, ROUTINE-ABSCESS     Status: None   Collection Time    03/05/13 11:28 AM      Result Value Range Status   Specimen Description ABSCESS LIVER   Final  Special Requests NONE   Final   Gram Stain PENDING   Incomplete   Culture NO GROWTH 1 DAY   Final   Report Status PENDING   Incomplete      03/06/2013, 1:19 PM     LOS: 2 days

## 2013-03-06 NOTE — Progress Notes (Signed)
Subjective: Pt feels 'ok' but not much better   Objective: Physical Exam: BP 110/65  Pulse 94  Temp(Src) 98.6 F (37 C) (Oral)  Resp 18  Ht 5\' 4"  (1.626 m)  Wt 178 lb 2.1 oz (80.8 kg)  BMI 30.56 kg/m2  SpO2 98% RUQ drain intact, site clean, NT Output still purulent, about 160cc recorded yesterday, about 30-40cc in bulb now.   Labs: CBC  Recent Labs  03/05/13 0801 03/06/13 0435  WBC 10.6* 11.5*  HGB 7.1* 7.3*  HCT 21.6* 22.5*  PLT 252 262   BMET  Recent Labs  03/05/13 0801 03/06/13 0435  NA 134* 138  K 4.3 4.6  CL 100 102  CO2 25 22  GLUCOSE 120* 131*  BUN 15 17  CREATININE 1.03 1.19  CALCIUM 8.9 8.8   LFT  Recent Labs  03/05/13 0801  PROT 7.7  ALBUMIN 2.2*  AST 24  ALT 19  ALKPHOS 103  BILITOT 0.8   PT/INR  Recent Labs  03/04/13 1208 03/04/13 1945  LABPROT 15.3* 16.0*  INR 1.23 1.31     Studies/Results: Ct Head Wo Contrast  03/04/2013  *RADIOLOGY REPORT*  Clinical Data: Facial droop  CT HEAD WITHOUT CONTRAST  Technique:  Contiguous axial images were obtained from the base of the skull through the vertex without contrast.  Comparison: 05/01/2011  Findings: No skull fracture is noted.  Paranasal sinuses and mastoid air cells are unremarkable.  Stable cerebral atrophy. Small lacunar infarct is noted in the right thalamus. Periventricular and subcortical white matter decreased attenuation probable due to chronic small vessel ischemic changes.  No acute infarction.  No mass lesion is noted on this unenhanced scan.  IMPRESSION: No acute intracranial abnormality.  Stable atrophy and chronic white matter disease.   Original Report Authenticated By: Natasha Mead, M.D.    Mr [name] Wo Contrast  03/04/2013  *RADIOLOGY REPORT*  Clinical Data: Evaluate for abscess.  History high blood pressure and cocaine abuse.  Right facial droop.  MRI HEAD WITHOUT AND WITH CONTRAST  Technique:  Multiplanar, multiecho pulse sequences of the brain and surrounding structures  were obtained according to standard protocol without and with intravenous contrast  Contrast: 19mL MULTIHANCE GADOBENATE DIMEGLUMINE 529 MG/ML IV SOLN  Comparison: 03/04/2013 the head CT.  05/01/2011 brain MR.  Findings: No acute infarct.  No intracranial mass or findings of intracranial abscess.  Remote infarct right thalamus.  Remote infarct posterior superior right frontal lobe.  Prominent small vessel disease type changes.  Atrophy without hydrocephalus.  No intracranial hemorrhage.  Heterogeneous bone marrow may reflect changes of underlying anemia.  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  Major intracranial vascular structures are patent.  Paranasal sinus mucosal thickening.  IMPRESSION: No acute infarct or findings of intracranial abscess.  Please see above.   Original Report Authenticated By: Lacy Duverney, M.D.    Ct Abdomen Pelvis W Contrast  03/04/2013  *RADIOLOGY REPORT*  Clinical Data: Abdominal pain, lethargy.  CT ABDOMEN AND PELVIS WITH CONTRAST  Technique:  Multidetector CT imaging of the abdomen and pelvis was performed following the standard protocol during bolus administration of intravenous contrast.  Contrast: OMNIPAQUE IOHEXOL 300 MG/ML  SOLN  Comparison: None.  Findings: Linear scarring or atelectasis posteriorly in the visualized left lower lung.  There is a complex multiloculated 7.9 cm lesion in the left hepatic lobe.  There is some mild enhancement around the central fluid attenuation components and some edematous changes in the liver parenchyma more  peripherally.  No other liver lesion is evident.  Unremarkable spleen, adrenal glands, kidneys, pancreas.  Aortoiliac atheromatous calcifications and plaque without aneurysm or stenosis.  Portal vein remains patent.  The stomach, small bowel, and colon are nondilated.  Foley catheter decompresses the urinary bladder.  Prostatic enlargement with central coarse calcifications. No ascites.  No free air.  No  adenopathy evident.  No hydronephrosis.  Spondylitic changes in the thoracolumbar spine most marked L3-4 and L5-S1.  IMPRESSION:  1.  Multiloculated cystic liver lesion, favor abscess over neoplasm.  This would be approachable for percutaneous drainage.   Original Report Authenticated By: D. Andria Rhein, MD    Ir Era Skeen W Catheter Placement  03/05/2013  *RADIOLOGY REPORT*  Clinical Data: Left hepatic abscess, polysubstance abuse  ULTRASOUND LEFT HEPATIC ABSCESS DRAIN INSERTION  Date:  03/05/2013 10:40:00  Radiologist:  M. Ruel Favors, M.D.  Medications:  2 mg Versed, 50 mcg Fentanyl  Guidance:  Ultrasound  Fluoroscopy time:  None.  Sedation time:  11 minutes  Contrast volume:  None.  Complications:  No immediate  PROCEDURE/FINDINGS:  Informed consent was obtained from the patient following explanation of the procedure, risks, benefits and alternatives. The patient understands, agrees and consents for the procedure. All questions were addressed.  A time out was performed.  Maximal barrier sterile technique utilized including caps, mask, sterile gowns, sterile gloves, large sterile drape, hand hygiene, and betadine  Previous imaging reviewed.  Preliminary ultrasound performed of the left hepatic lobe from a sub xyphoid approach.  The heterogeneous fluid collection in the left hepatic lobe lateral segment was localized.  Under sterile conditions and local anesthesia, an 18 gauge 15 cm access needle was advanced percutaneously into the left hepatic abscess.  Syringe aspiration yielded exudative fluid. Sample sent for Gram stain and culture.  Guide wire inserted followed by tract dilatation to insert 10-French drain.  Syringe aspiration yielded 30 ml additional exudative fluid.  Catheter secured with a Prolene suture and connected to external suction bulb.  Sterile dressing applied.  No immediate complication.  The patient tolerated the procedure well.  IMPRESSION: Successful ultrasound left hepatic abscess drain  insertion   Original Report Authenticated By: Judie Petit. Shick, M.D.    Dg Abd Acute W/chest  03/04/2013  *RADIOLOGY REPORT*  Clinical Data: Acute abdominal pain, facial droop  ACUTE ABDOMEN SERIES (ABDOMEN 2 VIEW & CHEST 1 VIEW)  Comparison: 08/15/2012  Findings: Cardiomediastinal silhouette is stable.  No acute infiltrate or pulmonary edema.  Mild gaseous distended small bowel loops mid abdomen suspicious for ileus.  No free abdominal air.  IMPRESSION: No acute disease within chest.  Mild gaseous distended small bowel loops mid abdomen suspicious for ileus.  No free abdominal air.   Original Report Authenticated By: Natasha Mead, M.D.     Assessment/Plan: S/p perc drain liver abscess Flushes 3x/day to keep drain functional. Fever spike last night, but nl this am. Following cultures. Other plans per primary/ID    LOS: 2 days    Brayton El PA-C 03/06/2013 9:10 AM

## 2013-03-06 NOTE — Progress Notes (Signed)
ANTIBIOTIC CONSULT NOTE-PROGRESS NOTE  Pharmacy Consult for : Zosyn Indication: Liver abscess  Hospital Problems Active Problems:   Cardiomyopathy   H/O cocaine abuse   Stroke   Liver abscess   Encephalopathy, metabolic   Anemia   Bacteremia  Vitals: BP 122/64  Pulse 95  Temp(Src) 98.9 F (37.2 C) (Oral)  Resp 20  Ht 5\' 4"  (1.626 m)  Wt 178 lb 2.1 oz (80.8 kg)  BMI 30.56 kg/m2  SpO2 97%  Labs:  Recent Labs  03/04/13 1255  03/05/13 0801 03/06/13 0435  WBC  --   < > 10.6* 11.5*  HGB 8.5*  < > 7.1* 7.3*  PLT  --   < > 252 262  CREATININE 1.20  --  1.03 1.19   Estimated Creatinine Clearance: 56.2 ml/min (by C-G formula based on Cr of 1.19).   Microbiology: Recent Results (from the past 720 hour(s))  CULTURE, BLOOD (ROUTINE X 2)     Status: None   Collection Time    03/04/13  7:30 PM      Result Value Range Status   Specimen Description BLOOD ARM LEFT   Final   Special Requests BOTTLES DRAWN AEROBIC AND ANAEROBIC 10CC   Final   Culture  Setup Time 03/05/2013 01:20   Final   Culture     Final   Value:        BLOOD CULTURE RECEIVED NO GROWTH TO DATE CULTURE WILL BE HELD FOR 5 DAYS BEFORE ISSUING A FINAL NEGATIVE REPORT   Report Status PENDING   Incomplete  CULTURE, BLOOD (ROUTINE X 2)     Status: None   Collection Time    03/04/13  7:45 PM      Result Value Range Status   Specimen Description BLOOD HAND LEFT   Final   Special Requests BOTTLES DRAWN AEROBIC AND ANAEROBIC 10CC   Final   Culture  Setup Time 03/05/2013 01:19   Final   Culture     Final   Value: STAPHYLOCOCCUS SPECIES (COAGULASE NEGATIVE)     Note: THE SIGNIFICANCE OF ISOLATING THIS ORGANISM FROM A SINGLE SET OF BLOOD CULTURES WHEN MULTIPLE SETS ARE DRAWN IS UNCERTAIN. PLEASE NOTIFY THE MICROBIOLOGY DEPARTMENT WITHIN ONE WEEK IF SPECIATION AND SENSITIVITIES ARE REQUIRED.     Note: Gram Stain Report Called to,Read Back By and Verified With: TINA ISAACS ON 03/06/2013 AT 12:40A BY WILEJ   Report Status  03/06/2013 FINAL   Final  ANAEROBIC CULTURE     Status: None   Collection Time    03/05/13 11:28 AM      Result Value Range Status   Specimen Description ABSCESS LIVER   Final   Special Requests NONE   Final   Gram Stain PENDING   Incomplete   Culture     Final   Value: NO ANAEROBES ISOLATED; CULTURE IN PROGRESS FOR 5 DAYS   Report Status PENDING   Incomplete  CULTURE, ROUTINE-ABSCESS     Status: None   Collection Time    03/05/13 11:28 AM      Result Value Range Status   Specimen Description ABSCESS LIVER   Final   Special Requests NONE   Final   Gram Stain PENDING   Incomplete   Culture NO GROWTH 1 DAY   Final   Report Status PENDING   Incomplete    Anti-infectives Anti-infectives   Start     Dose/Rate Route Frequency Ordered Stop   03/06/13 1000  vancomycin (VANCOCIN) IVPB 1000 mg/200 mL  premix  Status:  Discontinued     1,000 mg 200 mL/hr over 60 Minutes Intravenous Every 12 hours 03/06/13 0133 03/06/13 1344   03/06/13 0200  vancomycin (VANCOCIN) 1,500 mg in sodium chloride 0.9 % 500 mL IVPB     1,500 mg 250 mL/hr over 120 Minutes Intravenous  Once 03/06/13 0133 03/06/13 0408   03/04/13 2200  imipenem-cilastatin (PRIMAXIN) 500 mg in sodium chloride 0.9 % 100 mL IVPB  Status:  Discontinued     500 mg 200 mL/hr over 30 Minutes Intravenous 3 times per day 03/04/13 1845 03/06/13 0738   03/04/13 1900  metroNIDAZOLE (FLAGYL) IVPB 500 mg  Status:  Discontinued     500 mg 100 mL/hr over 60 Minutes Intravenous Every 8 hours 03/04/13 1845 03/06/13 0738   03/04/13 1845  imipenem-cilastatin (PRIMAXIN) 500 mg in sodium chloride 0.9 % 100 mL IVPB  Status:  Discontinued     500 mg 200 mL/hr over 30 Minutes Intravenous  Once 03/04/13 1837 03/04/13 1845     Assessment:  67 y/o male who is to be placed on Zosyn for Liver abscess, bacteremia.  S/P IR drain of abscess.  Tmax in last 24 hours 104.1 F, Currently afebrile.  WBC 11.5.  Est CrCl 56 ml/min.  Blood culture + for Coag  negative Staph which is felt, by ID consult, to be a contaminant.    S/P Vancomycin, Primaxin, and Flagyl.  Goal of Therapy:  Antibiotic selected for infection/cultures and adjusted for renal or hepatic function.    Plan:  Begin Zosyn 3.375 gm IV q 8 hours, each dose to infuse over 4 hours Follow up SCr, UOP, cultures, clinical course and adjust as clinically indicated.   Laurena Bering, Pharm.D.  03/06/2013 2:01 PM

## 2013-03-07 DIAGNOSIS — D509 Iron deficiency anemia, unspecified: Secondary | ICD-10-CM

## 2013-03-07 LAB — GLUCOSE, CAPILLARY
Glucose-Capillary: 150 mg/dL — ABNORMAL HIGH (ref 70–99)
Glucose-Capillary: 188 mg/dL — ABNORMAL HIGH (ref 70–99)

## 2013-03-07 LAB — CBC
HCT: 20.5 % — ABNORMAL LOW (ref 39.0–52.0)
MCH: 25.3 pg — ABNORMAL LOW (ref 26.0–34.0)
MCHC: 31.7 g/dL (ref 30.0–36.0)
MCV: 79.8 fL (ref 78.0–100.0)
Platelets: 262 10*3/uL (ref 150–400)
RDW: 15.9 % — ABNORMAL HIGH (ref 11.5–15.5)

## 2013-03-07 LAB — BASIC METABOLIC PANEL
BUN: 18 mg/dL (ref 6–23)
Calcium: 9.3 mg/dL (ref 8.4–10.5)
Creatinine, Ser: 1.17 mg/dL (ref 0.50–1.35)
GFR calc Af Amer: 72 mL/min — ABNORMAL LOW (ref >90)

## 2013-03-07 LAB — URINE CULTURE: Colony Count: 10000

## 2013-03-07 LAB — HEPATITIS PANEL, ACUTE
HCV Ab: NEGATIVE
Hepatitis B Surface Ag: NEGATIVE

## 2013-03-07 MED ORDER — ENSURE COMPLETE PO LIQD
237.0000 mL | Freq: Two times a day (BID) | ORAL | Status: DC
  Administered 2013-03-07 – 2013-03-10 (×4): 237 mL via ORAL

## 2013-03-07 MED ORDER — POLYETHYLENE GLYCOL 3350 17 G PO PACK
17.0000 g | PACK | Freq: Every day | ORAL | Status: DC
  Administered 2013-03-07 – 2013-03-10 (×4): 17 g via ORAL
  Filled 2013-03-07 (×6): qty 1

## 2013-03-07 MED ORDER — ADULT MULTIVITAMIN W/MINERALS CH
1.0000 | ORAL_TABLET | Freq: Every day | ORAL | Status: DC
  Administered 2013-03-07 – 2013-03-12 (×5): 1 via ORAL
  Filled 2013-03-07 (×6): qty 1

## 2013-03-07 NOTE — Progress Notes (Signed)
Utilization review completed.  

## 2013-03-07 NOTE — Progress Notes (Signed)
[facility] PROGRESS NOTE  Brad Harrell ZOX:[id number] DOB: 12/14/1945 DOA: 03/04/2013 PCP: Dorrene German, MD  Assessment/Plan: 1. Liver abscess/cyst: -IR s/p aspiration and drain 5/7, FU cultures-negative so far -Blood Cx with1/2 Coag negative staph: contaminant -Started Zosyn per Dr.Hatcher yetserday -Appreciate ID consult  2. Metabolic encephalopathy: - due to above -improved -MRI negative for CVA  3. H/o CVA -with residual hemiparesis -continue ASA/Plavix/statin  4. Anemia: - Anemia panel c/w Iron deficiency,  - no overt blood loss, transfuse 2 units PRBC today  5. Constipation: On senokot, add miralax  Ambulate, PT /OT  DVT proph: lovenox  Code Status: full  Family Communication: none at bedside  Disposition Plan: home when improved    Consultants: IR ID  Antibiotics:  IV zosyn 5/8  HPI/Subjective: Feels ok, belly not hurting  Objective: Filed Vitals:   03/06/13 1450 03/06/13 1752 03/06/13 2047 03/07/13 0523  BP: 120/60 116/72 113/63 116/61  Pulse: 88 89 88 88  Temp: 98.8 F (37.1 C) 99.3 F (37.4 C) 99 F (37.2 C) 99.3 F (37.4 C)  TempSrc: Oral  Oral Oral  Resp: 18 16 18 18   Height:      Weight:   80.4 kg (177 lb 4 oz)   SpO2: 95% 96% 97% 97%    Intake/Output Summary (Last 24 hours) at 03/07/13 0800 Last data filed at 03/07/13 0525  Gross per 24 hour  Intake    370 ml  Output   1020 ml  Net   -650 ml   Filed Weights   03/04/13 2140 03/05/13 2008 03/06/13 2047  Weight: 79.493 kg (175 lb 4 oz) 80.8 kg (178 lb 2.1 oz) 80.4 kg (177 lb 4 oz)    Exam:   General:  AAO to self and place  Cardiovascular: S1S2/RRR  Respiratory: CTAB  Abdomen: soft, mild diffuse tenderness , drain noted slightly distended, BS present  Ext: mild residual L leg weakness   Data Reviewed: Basic Metabolic Panel:  Recent Labs Lab 03/04/13 1208 03/04/13 1255 03/04/13 1945 03/05/13 0801 03/06/13 0435  NA 135 139  --  134* 138  K 4.5 4.5   --  4.3 4.6  CL 96 104  --  100 102  CO2 26  --   --  25 22  GLUCOSE 144* 144*  --  120* 131*  BUN 20 22  --  15 17  CREATININE 1.21 1.20  --  1.03 1.19  CALCIUM 9.7  --   --  8.9 8.8  MG  --   --  2.5  --   --   PHOS  --   --  4.1  --   --    Liver Function Tests:  Recent Labs Lab 03/04/13 1208 03/05/13 0801  AST 34 24  ALT 23 19  ALKPHOS 115 103  BILITOT 0.7 0.8  PROT 9.1* 7.7  ALBUMIN 2.7* 2.2*   No results found for this basename: LIPASE, AMYLASE,  in the last 168 hours  Recent Labs Lab 03/04/13 1920  AMMONIA 23   CBC:  Recent Labs Lab 03/04/13 1208 03/04/13 1255 03/04/13 1945 03/05/13 0801 03/06/13 0435 03/07/13 0647  WBC 13.8*  --  12.6* 10.6* 11.5* 11.2*  NEUTROABS 10.5*  --   --   --   --   --   HGB 8.0* 8.5* 8.3* 7.1* 7.3* 6.5*  HCT 25.0* 25.0* 25.2* 21.6* 22.5* 20.5*  MCV 79.6  --  79.2 78.5 78.4 79.8  PLT 323  --  316  252 262 262   Cardiac Enzymes:  Recent Labs Lab 03/04/13 1937 03/05/13 0054  TROPONINI <0.30 <0.30   BNP (last 3 results) No results found for this basename: PROBNP,  in the last 8760 hours CBG:  Recent Labs Lab 03/06/13 0002 03/06/13 1157 03/06/13 1900 03/06/13 2355 03/07/13 0714  GLUCAP 141* 148* 119* 120* 102*    Recent Results (from the past 240 hour(s))  URINE CULTURE     Status: None   Collection Time    03/04/13  2:15 PM      Result Value Range Status   Specimen Description URINE, CLEAN CATCH   Final   Special Requests NONE   Final   Culture  Setup Time 03/04/2013 15:56   Final   Colony Count 10,000 COLONIES/ML   Final   Culture     Final   Value: METHICILLIN RESISTANT STAPHYLOCOCCUS AUREUS     Note: RIFAMPIN AND GENTAMICIN SHOULD NOT BE USED AS SINGLE DRUGS FOR TREATMENT OF STAPH INFECTIONS. CRITICAL RESULT CALLED TO, READ BACK BY AND VERIFIED WITH: DENISE BEASON @ 0142 ON 03/07/2013   Report Status 03/07/2013 FINAL   Final   Organism ID, Bacteria METHICILLIN RESISTANT STAPHYLOCOCCUS AUREUS   Final   CULTURE, BLOOD (ROUTINE X 2)     Status: None   Collection Time    03/04/13  7:30 PM      Result Value Range Status   Specimen Description BLOOD ARM LEFT   Final   Special Requests BOTTLES DRAWN AEROBIC AND ANAEROBIC 10CC   Final   Culture  Setup Time 03/05/2013 01:20   Final   Culture     Final   Value:        BLOOD CULTURE RECEIVED NO GROWTH TO DATE CULTURE WILL BE HELD FOR 5 DAYS BEFORE ISSUING A FINAL NEGATIVE REPORT   Report Status PENDING   Incomplete  CULTURE, BLOOD (ROUTINE X 2)     Status: None   Collection Time    03/04/13  7:45 PM      Result Value Range Status   Specimen Description BLOOD HAND LEFT   Final   Special Requests BOTTLES DRAWN AEROBIC AND ANAEROBIC 10CC   Final   Culture  Setup Time 03/05/2013 01:19   Final   Culture     Final   Value: STAPHYLOCOCCUS SPECIES (COAGULASE NEGATIVE)     Note: THE SIGNIFICANCE OF ISOLATING THIS ORGANISM FROM A SINGLE SET OF BLOOD CULTURES WHEN MULTIPLE SETS ARE DRAWN IS UNCERTAIN. PLEASE NOTIFY THE MICROBIOLOGY DEPARTMENT WITHIN ONE WEEK IF SPECIATION AND SENSITIVITIES ARE REQUIRED.     Note: Gram Stain Report Called to,Read Back By and Verified With: TINA ISAACS ON 03/06/2013 AT 12:40A BY WILEJ   Report Status 03/06/2013 FINAL   Final  ANAEROBIC CULTURE     Status: None   Collection Time    03/05/13 11:28 AM      Result Value Range Status   Specimen Description ABSCESS LIVER   Final   Special Requests NONE   Final   Gram Stain PENDING   Incomplete   Culture     Final   Value: NO ANAEROBES ISOLATED; CULTURE IN PROGRESS FOR 5 DAYS   Report Status PENDING   Incomplete  CULTURE, ROUTINE-ABSCESS     Status: None   Collection Time    03/05/13 11:28 AM      Result Value Range Status   Specimen Description ABSCESS LIVER   Final   Special Requests NONE   Final  Gram Stain PENDING   Incomplete   Culture NO GROWTH 1 DAY   Final   Report Status PENDING   Incomplete     Studies: Ir Era Skeen W Catheter Placement  03/05/2013   *RADIOLOGY REPORT*  Clinical Data: Left hepatic abscess, polysubstance abuse  ULTRASOUND LEFT HEPATIC ABSCESS DRAIN INSERTION  Date:  03/05/2013 10:40:00  Radiologist:  Judie Petit. Ruel Favors, M.D.  Medications:  2 mg Versed, 50 mcg Fentanyl  Guidance:  Ultrasound  Fluoroscopy time:  None.  Sedation time:  11 minutes  Contrast volume:  None.  Complications:  No immediate  PROCEDURE/FINDINGS:  Informed consent was obtained from the patient following explanation of the procedure, risks, benefits and alternatives. The patient understands, agrees and consents for the procedure. All questions were addressed.  A time out was performed.  Maximal barrier sterile technique utilized including caps, mask, sterile gowns, sterile gloves, large sterile drape, hand hygiene, and betadine  Previous imaging reviewed.  Preliminary ultrasound performed of the left hepatic lobe from a sub xyphoid approach.  The heterogeneous fluid collection in the left hepatic lobe lateral segment was localized.  Under sterile conditions and local anesthesia, an 18 gauge 15 cm access needle was advanced percutaneously into the left hepatic abscess.  Syringe aspiration yielded exudative fluid. Sample sent for Gram stain and culture.  Guide wire inserted followed by tract dilatation to insert 10-French drain.  Syringe aspiration yielded 30 ml additional exudative fluid.  Catheter secured with a Prolene suture and connected to external suction bulb.  Sterile dressing applied.  No immediate complication.  The patient tolerated the procedure well.  IMPRESSION: Successful ultrasound left hepatic abscess drain insertion   Original Report Authenticated By: Judie Petit. Shick, M.D.     Scheduled Meds: . carvedilol  6.25 mg Oral BID WC  . docusate sodium  100 mg Oral BID  . enoxaparin (LOVENOX) injection  40 mg Subcutaneous Daily  . piperacillin-tazobactam  3.375 g Intravenous Q8H  . saccharomyces boulardii  250 mg Oral BID  . senna  1 tablet Oral BID  . sodium chloride   3 mL Intravenous Q12H   Continuous Infusions: . sodium chloride 75 mL/hr (03/04/13 1857)    Active Problems:   Cardiomyopathy   H/O cocaine abuse   Stroke   Liver abscess   Encephalopathy, metabolic   Anemia   Bacteremia    Time spent:    [facility]  [facility] Pager [phone]. If 7PM-7AM, please contact night-coverage at [email or web address], password [facility] 03/07/2013, 8:00 AM  LOS: 3 days

## 2013-03-07 NOTE — Progress Notes (Signed)
CRITICAL VALUE ALERT  Critical value received:  Hemoglobin 6.5  Date of notification:  03/07/13  Time of notification:  0740  Critical value read back:yes  Nurse who received alert:  Melvyn Novas  MD notified (1st page):  Joseph,Preetha  Time of first page:  [phone]  MD notified (2nd page):  Time of second page:  Responding MD:  Mitchel Honour  Time MD responded:  [phone]

## 2013-03-07 NOTE — Progress Notes (Signed)
Subjective: Pt feels a little better overall   Objective: Physical Exam: BP 110/59  Pulse 80  Temp(Src) 98.9 F (37.2 C) (Oral)  Resp 18  Ht 5\' 4"  (1.626 m)  Wt 177 lb 4 oz (80.4 kg)  BMI 30.41 kg/m2  SpO2 95% RUQ drain intact, site clean, NT Output thinner, less purulent, about 70cc recorded yesterday,   Labs: CBC  Recent Labs  03/06/13 0435 03/07/13 0647  WBC 11.5* 11.2*  HGB 7.3* 6.5*  HCT 22.5* 20.5*  PLT 262 262   BMET  Recent Labs  03/06/13 0435 03/07/13 0647  NA 138 138  K 4.6 4.0  CL 102 102  CO2 22 25  GLUCOSE 131* 107*  BUN 17 18  CREATININE 1.19 1.17  CALCIUM 8.8 9.3   LFT  Recent Labs  03/05/13 0801  PROT 7.7  ALBUMIN 2.2*  AST 24  ALT 19  ALKPHOS 103  BILITOT 0.8   PT/INR  Recent Labs  03/04/13 1208 03/04/13 1945  LABPROT 15.3* 16.0*  INR 1.23 1.31     Studies/Results: Ir Guided Drain W Catheter Placement  03/05/2013  *RADIOLOGY REPORT*  Clinical Data: Left hepatic abscess, polysubstance abuse  ULTRASOUND LEFT HEPATIC ABSCESS DRAIN INSERTION  Date:  03/05/2013 10:40:00  Radiologist:  Judie Petit. Ruel Favors, M.D.  Medications:  2 mg Versed, 50 mcg Fentanyl  Guidance:  Ultrasound  Fluoroscopy time:  None.  Sedation time:  11 minutes  Contrast volume:  None.  Complications:  No immediate  PROCEDURE/FINDINGS:  Informed consent was obtained from the patient following explanation of the procedure, risks, benefits and alternatives. The patient understands, agrees and consents for the procedure. All questions were addressed.  A time out was performed.  Maximal barrier sterile technique utilized including caps, mask, sterile gowns, sterile gloves, large sterile drape, hand hygiene, and betadine  Previous imaging reviewed.  Preliminary ultrasound performed of the left hepatic lobe from a sub xyphoid approach.  The heterogeneous fluid collection in the left hepatic lobe lateral segment was localized.  Under sterile conditions and local anesthesia, an 18  gauge 15 cm access needle was advanced percutaneously into the left hepatic abscess.  Syringe aspiration yielded exudative fluid. Sample sent for Gram stain and culture.  Guide wire inserted followed by tract dilatation to insert 10-French drain.  Syringe aspiration yielded 30 ml additional exudative fluid.  Catheter secured with a Prolene suture and connected to external suction bulb.  Sterile dressing applied.  No immediate complication.  The patient tolerated the procedure well.  IMPRESSION: Successful ultrasound left hepatic abscess drain insertion   Original Report Authenticated By: Judie Petit. Miles Costain, M.D.     Assessment/Plan: S/p perc drain liver abscess Flushes 3x/day to keep drain functional. Afebrile past 24hrs. Following cultures. Other plans per primary/ID    LOS: 3 days    Brayton El PA-C 03/07/2013 9:44 AM

## 2013-03-07 NOTE — Progress Notes (Signed)
NUTRITION FOLLOW UP  Intervention:   1. Add Ensure Complete po BID, each supplement provides 350 kcal and 13 grams of protein. 2. Add MVI  3. RD to continue to follow nutrition care plan.  Nutrition Dx:   Inadequate oral intake related to poor appetite as evidenced by H&P. Ongoing.  Goal:   Intake to meet >90% of estimated nutrition needs. Unmet.  Monitor:   weight trends, lab trends, I/O's, PO intake, supplement tolerance  Assessment:   Admitted with decreased appetite, malaise, abdominal pain, and possible R facial droop x 1 - 2 weeks. ER work-up reveals liver abscess, scheduled for liver abscess drain placement. Notable hx of cocaine use.  Underwent L liver abscess drain insertion 5/7. Cultures have been negative so far. Pt with anemia, to receive 2 units PRBC today.  Advanced to Regular diet on 5/8. Per RN, when daughter is available and providing encouragement, pt will eat well. Otherwise, not eating very well. RD to add Ensure Complete. RN suspects pt will "perk up" once pt gets transfused.  Discussed during progression rounds.  Height: Ht Readings from Last 1 Encounters:  03/04/13 5\' 4"  (1.626 m)    Weight Status:   Wt Readings from Last 1 Encounters:  03/06/13 177 lb 4 oz (80.4 kg)  Wt stable since admission.  Re-estimated needs:  Kcal: 1700 - 1900 Protein: 75 - 90 grams Fluid: 1.7 - 1.9 liters daily  Skin: intact  Diet Order: General   Intake/Output Summary (Last 24 hours) at 03/07/13 0902 Last data filed at 03/07/13 0525  Gross per 24 hour  Intake    365 ml  Output    870 ml  Net   -505 ml    Last BM: PTA   Labs:   Recent Labs Lab 03/04/13 1255 03/04/13 1945 03/05/13 0801 03/06/13 0435 03/07/13 0647  NA 139  --  134* 138 138  K 4.5  --  4.3 4.6 4.0  CL 104  --  100 102 102  CO2  --   --  25 22 25   BUN 22  --  15 17 18   CREATININE 1.20  --  1.03 1.19 1.17  CALCIUM  --   --  8.9 8.8 9.3  MG  --  2.5  --   --   --   PHOS  --  4.1  --    --   --   GLUCOSE 144*  --  120* 131* 107*    CBG (last 3)   Recent Labs  03/06/13 1900 03/06/13 2355 03/07/13 0714  GLUCAP 119* 120* 102*    Scheduled Meds: . carvedilol  6.25 mg Oral BID WC  . docusate sodium  100 mg Oral BID  . enoxaparin (LOVENOX) injection  40 mg Subcutaneous Daily  . piperacillin-tazobactam  3.375 g Intravenous Q8H  . polyethylene glycol  17 g Oral Daily  . saccharomyces boulardii  250 mg Oral BID  . senna  1 tablet Oral BID  . sodium chloride  3 mL Intravenous Q12H    Continuous Infusions: . sodium chloride 75 mL/hr (03/04/13 1857)    Jarold Motto MS, RD, LDN Pager: [phone] After-hours pager: [phone]

## 2013-03-07 NOTE — Progress Notes (Signed)
Late entry    CRITICAL VALUE ALERT  Critical value received: Urine culture positive MRSA Date of notification: 03/07/13 Time of notification:  0150 Critical value read back:yes  Nurse who received alert:  Theresa Mulligan  MD notified (1st page): L. Eastwood (oncall [facility])  Time of first page: 0220  MD notified (2nd page):  Time of second page:  Responding MD:   Time MD responded:

## 2013-03-07 NOTE — Progress Notes (Addendum)
INFECTIOUS DISEASE PROGRESS NOTE  ID: Brad Harrell is a 68 y.o. male with  Active Problems:   Cardiomyopathy   H/O cocaine abuse   Stroke   Liver abscess   Encephalopathy, metabolic   Anemia   Bacteremia  Subjective: Without complaints  Abtx:  Anti-infectives   Start     Dose/Rate Route Frequency Ordered Stop   03/06/13 1430  piperacillin-tazobactam (ZOSYN) IVPB 3.375 g     3.375 g 12.5 mL/hr over 240 Minutes Intravenous Every 8 hours 03/06/13 1409     03/06/13 1000  vancomycin (VANCOCIN) IVPB 1000 mg/200 mL premix  Status:  Discontinued     1,000 mg 200 mL/hr over 60 Minutes Intravenous Every 12 hours 03/06/13 0133 03/06/13 1344   03/06/13 0200  vancomycin (VANCOCIN) 1,500 mg in sodium chloride 0.9 % 500 mL IVPB     1,500 mg 250 mL/hr over 120 Minutes Intravenous  Once 03/06/13 0133 03/06/13 0408   03/04/13 2200  imipenem-cilastatin (PRIMAXIN) 500 mg in sodium chloride 0.9 % 100 mL IVPB  Status:  Discontinued     500 mg 200 mL/hr over 30 Minutes Intravenous 3 times per day 03/04/13 1845 03/06/13 0738   03/04/13 1900  metroNIDAZOLE (FLAGYL) IVPB 500 mg  Status:  Discontinued     500 mg 100 mL/hr over 60 Minutes Intravenous Every 8 hours 03/04/13 1845 03/06/13 0738   03/04/13 1845  imipenem-cilastatin (PRIMAXIN) 500 mg in sodium chloride 0.9 % 100 mL IVPB  Status:  Discontinued     500 mg 200 mL/hr over 30 Minutes Intravenous  Once 03/04/13 1837 03/04/13 1845      Medications:  Scheduled: . carvedilol  6.25 mg Oral BID WC  . docusate sodium  100 mg Oral BID  . enoxaparin (LOVENOX) injection  40 mg Subcutaneous Daily  . feeding supplement  237 mL Oral BID BM  . multivitamin with minerals  1 tablet Oral Daily  . piperacillin-tazobactam  3.375 g Intravenous Q8H  . polyethylene glycol  17 g Oral Daily  . saccharomyces boulardii  250 mg Oral BID  . senna  1 tablet Oral BID  . sodium chloride  3 mL Intravenous Q12H    Objective: Vital signs in last 24  hours: Temp:  [98.4 F (36.9 C)-99.6 F (37.6 C)] 99.6 F (37.6 C) (05/09 1600) Pulse Rate:  [80-89] 82 (05/09 1600) Resp:  [16-20] 16 (05/09 1600) BP: (103-116)/(57-72) 106/65 mmHg (05/09 1600) SpO2:  [95 %-98 %] 97 % (05/09 1422) Weight:  [80.4 kg (177 lb 4 oz)] 80.4 kg (177 lb 4 oz) (05/08 2047)   General appearance: alert, cooperative and no distress GI: normal findings: bowel sounds normal and soft, non-tender and abnormal findings:  distended  Lab Results  Recent Labs  03/06/13 0435 03/07/13 0647  WBC 11.5* 11.2*  HGB 7.3* 6.5*  HCT 22.5* 20.5*  NA 138 138  K 4.6 4.0  CL 102 102  CO2 22 25  BUN 17 18  CREATININE 1.19 1.17   Liver Panel  Recent Labs  03/05/13 0801  PROT 7.7  ALBUMIN 2.2*  AST 24  ALT 19  ALKPHOS 103  BILITOT 0.8   Sedimentation Rate No results found for this basename: ESRSEDRATE,  in the last 72 hours C-Reactive Protein No results found for this basename: CRP,  in the last 72 hours  Microbiology: Recent Results (from the past 240 hour(s))  URINE CULTURE     Status: None   Collection Time    03/04/13  2:15 PM      Result Value Range Status   Specimen Description URINE, CLEAN CATCH   Final   Special Requests NONE   Final   Culture  Setup Time 03/04/2013 15:56   Final   Colony Count 10,000 COLONIES/ML   Final   Culture     Final   Value: METHICILLIN RESISTANT STAPHYLOCOCCUS AUREUS     Note: RIFAMPIN AND GENTAMICIN SHOULD NOT BE USED AS SINGLE DRUGS FOR TREATMENT OF STAPH INFECTIONS. CRITICAL RESULT CALLED TO, READ BACK BY AND VERIFIED WITH: DENISE BEASON @ 0142 ON 03/07/2013   Report Status 03/07/2013 FINAL   Final   Organism ID, Bacteria METHICILLIN RESISTANT STAPHYLOCOCCUS AUREUS   Final  CULTURE, BLOOD (ROUTINE X 2)     Status: None   Collection Time    03/04/13  7:30 PM      Result Value Range Status   Specimen Description BLOOD ARM LEFT   Final   Special Requests BOTTLES DRAWN AEROBIC AND ANAEROBIC 10CC   Final   Culture   Setup Time 03/05/2013 01:20   Final   Culture     Final   Value:        BLOOD CULTURE RECEIVED NO GROWTH TO DATE CULTURE WILL BE HELD FOR 5 DAYS BEFORE ISSUING A FINAL NEGATIVE REPORT   Report Status PENDING   Incomplete  CULTURE, BLOOD (ROUTINE X 2)     Status: None   Collection Time    03/04/13  7:45 PM      Result Value Range Status   Specimen Description BLOOD HAND LEFT   Final   Special Requests BOTTLES DRAWN AEROBIC AND ANAEROBIC 10CC   Final   Culture  Setup Time 03/05/2013 01:19   Final   Culture     Final   Value: STAPHYLOCOCCUS SPECIES (COAGULASE NEGATIVE)     Note: THE SIGNIFICANCE OF ISOLATING THIS ORGANISM FROM A SINGLE SET OF BLOOD CULTURES WHEN MULTIPLE SETS ARE DRAWN IS UNCERTAIN. PLEASE NOTIFY THE MICROBIOLOGY DEPARTMENT WITHIN ONE WEEK IF SPECIATION AND SENSITIVITIES ARE REQUIRED.     Note: Gram Stain Report Called to,Read Back By and Verified With: TINA ISAACS ON 03/06/2013 AT 12:40A BY WILEJ   Report Status 03/06/2013 FINAL   Final  ANAEROBIC CULTURE     Status: None   Collection Time    03/05/13 11:28 AM      Result Value Range Status   Specimen Description ABSCESS LIVER   Final   Special Requests NONE   Final   Gram Stain PENDING   Incomplete   Culture     Final   Value: NO ANAEROBES ISOLATED; CULTURE IN PROGRESS FOR 5 DAYS   Report Status PENDING   Incomplete  CULTURE, ROUTINE-ABSCESS     Status: None   Collection Time    03/05/13 11:28 AM      Result Value Range Status   Specimen Description ABSCESS LIVER   Final   Special Requests NONE   Final   Gram Stain     Final   Value: FEW WBC PRESENT,BOTH PMN AND MONONUCLEAR     NO SQUAMOUS EPITHELIAL CELLS SEEN     MODERATE GRAM VARIABLE ROD   Culture Culture reincubated for better growth   Final   Report Status PENDING   Incomplete    Studies/Results: No results found.   Assessment/Plan: Liver Abscess  Gram stain- gram variable rods Anemia of ? source Would consider investigating his blood loss (may lead  to source of  his liver abscsess)  Consider bactrim for MRSA in urine Cx  HIV/Hep C (-).  Amoeba serology pending.   Total days of antibiotics: 2 (zosyn)   Johny Sax Infectious Diseases [phone] 03/07/2013, 4:26 PM   LOS: 3 days

## 2013-03-07 NOTE — Evaluation (Signed)
Physical Therapy Evaluation Patient Details Name: Brad Harrell MRN: [id number] DOB: 1946-01-22 Today's Date: 03/07/2013 Time: 1425-1440 PT Time Calculation (min): 15 min  PT Assessment / Plan / Recommendation Clinical Impression  67 yo male admitted with nausea, vomiting, abdominal pain and lethargy.  Pt found to have a liver absess and drain was inserted. Pt also with past medical history significant for bilateral CVA with some residual weakness.  Pt states he is alone most of the time during the day, but  he does not want to consider going to SNF for rehab. He will need 24/7 assist and benefit from continued PT at d/c.    PT Assessment  Patient needs continued PT services    Follow Up Recommendations  SNF (unless family can provide 24/7 assist , then HHPT)    Does the patient have the potential to tolerate intense rehabilitation      Barriers to Discharge Decreased caregiver support      Equipment Recommendations  None recommended by PT    Recommendations for Other Services OT consult   Frequency Min 3X/week    Precautions / Restrictions     Pertinent Vitals/Pain No c/o pain.  Pt to receive blood transfusion      Mobility  Bed Mobility Bed Mobility: Rolling Right;Right Sidelying to Sit;Sit to Supine Rolling Right: 4: Min assist Right Sidelying to Sit: 4: Min assist Sit to Supine: 3: Mod assist Details for Bed Mobility Assistance: pt takes extra time for mobility and has decreased cooridination in moving both sides of his body for functional movement Transfers Transfers: Sit to Stand;Stand to Sit Sit to Stand: 3: Mod assist;From bed;From elevated surface Stand to Sit: 3: Mod assist Details for Transfer Assistance: pt needed assist to lift hips off bed Ambulation/Gait General Gait Details: did not test as nursing to start blood transfusion    Exercises     PT Diagnosis: Difficulty walking;Abnormality of gait;Generalized weakness;Hemiplegia non-dominant  side;Hemiplegia dominant side  PT Problem List: Decreased strength;Decreased activity tolerance;Decreased balance;Decreased mobility;Decreased coordination;Decreased safety awareness PT Treatment Interventions: DME instruction;Gait training;Functional mobility training;Therapeutic activities;Therapeutic exercise;Balance training;Cognitive remediation;Patient/family education   PT Goals Acute Rehab PT Goals PT Goal Formulation: With patient Time For Goal Achievement: 03/21/13 Potential to Achieve Goals: Good Pt will go Supine/Side to Sit: Independently PT Goal: Supine/Side to Sit - Progress: Goal set today Pt will go Sit to Supine/Side: Independently PT Goal: Sit to Supine/Side - Progress: Goal set today Pt will go Sit to Stand: with supervision PT Goal: Sit to Stand - Progress: Goal set today Pt will go Stand to Sit: with supervision PT Goal: Stand to Sit - Progress: Goal set today Pt will Ambulate: 51 - 150 feet;with min assist PT Goal: Ambulate - Progress: Goal set today  Visit Information  Last PT Received On: 03/07/13    Subjective Data  Subjective: Pt says he does not want to go to SNF for rehab.  Patient Stated Goal: to go home   Prior Functioning  Home Living Lives With: Daughter Available Help at Discharge: Available PRN/intermittently (dtr works) Type of Home: House Home Access: Stairs to enter Secretary/administrator of Steps: 2-3 Entrance Stairs-Rails: Left Home Layout: One level Home Adaptive Equipment: Walker - rolling;Bedside commode/3-in-1;Hospital bed Prior Function Level of Independence: Independent with assistive device(s) Communication Communication: No difficulties    Cognition  Cognition Arousal/Alertness: Awake/alert Behavior During Therapy: WFL for tasks assessed/performed Overall Cognitive Status:  (pt states he doesn't know why he is here)    Extremity/Trunk Assessment  Right Lower Extremity Assessment RLE ROM/Strength/Tone: Deficits RLE  ROM/Strength/Tone Deficits: ROM appears good and strength is ~ 3+/5 RLE Coordination: Deficits Left Lower Extremity Assessment LLE ROM/Strength/Tone: Deficits LLE ROM/Strength/Tone Deficits: ROM appears good and strength is ~ 3+/5 LLE Coordination: Deficits Trunk Assessment Trunk Assessment: Other exceptions Trunk Exceptions: Right facial weakness with ptosis of R eye.  Drain from right lateral low back area   Balance Balance Balance Assessed: Yes Static Sitting Balance Static Sitting - Balance Support: Bilateral upper extremity supported Static Sitting - Level of Assistance: 5: Stand by assistance Static Standing Balance Static Standing - Balance Support: Bilateral upper extremity supported Static Standing - Level of Assistance: 3: Mod assist  End of Session PT - End of Session Activity Tolerance: Patient limited by fatigue Patient left: in bed;with nursing in room  GP    Brad Harrell, [place] [phone] 03/07/2013, 2:52 PM

## 2013-03-07 NOTE — Evaluation (Signed)
Occupational Therapy Evaluation Patient Details Name: Brad Harrell MRN: [id number] DOB: 02-18-1946 Today's Date: 03/07/2013 Time: 1478-2956 OT Time Calculation (min): 25 min  OT Assessment / Plan / Recommendation Clinical Impression  This 67 yo male admitted with Abdominal pain and found to have liver abcess (now s/p drain) presents to acute OT with what appears to be excerbation of old stroke symptoms. Will benefit from acute OT with follow up OT on CIR.    OT Assessment  Patient needs continued OT Services    Follow Up Recommendations  CIR (Feel pt can get back to a Mod I level)    Barriers to Discharge None    Equipment Recommendations  None recommended by OT       Frequency  Min 2X/week    Precautions / Restrictions Precautions Precautions: Fall Precaution Comments: bulb drain on right side Restrictions Weight Bearing Restrictions: No       ADL  Eating/Feeding: Simulated;Set up Where Assessed - Eating/Feeding: Bed level Grooming: Simulated;Minimal assistance Where Assessed - Grooming: Supported sitting Upper Body Bathing: Simulated;Minimal assistance Where Assessed - Upper Body Bathing: Supported sitting Lower Body Bathing: Simulated;Moderate assistance Where Assessed - Lower Body Bathing: Supported sit to stand Upper Body Dressing: Minimal assistance Where Assessed - Upper Body Dressing: Supported sitting Lower Body Dressing: Simulated;Maximal assistance Where Assessed - Lower Body Dressing: Supported sit to Pharmacist, hospital: Electronics engineer Method: Surveyor, minerals:  (Bed to chair beside bed) Toileting - Clothing Manipulation and Hygiene: Simulated;Maximal assistance Where Assessed - Engineer, mining and Hygiene: Standing Equipment Used: Gait belt Transfers/Ambulation Related to ADLs: Min A sit to stand, Max A stand pivot to the left (left side has always been the weaker side per pt's  daughter) ADL Comments: Pt was I/Mod I with all BADLs pta. Daughter reports that the most the patient does if no one is in the house with him is get up to his [facility].    OT Diagnosis: Generalized weakness;Hemiplegia non-dominant side;Blindness and low vision  OT Problem List: Decreased strength;Impaired balance (sitting and/or standing);Decreased coordination;Impaired vision/perception;Impaired UE functional use OT Treatment Interventions: Self-care/ADL training;Balance training;Patient/family education;DME and/or AE instruction;Therapeutic activities;Therapeutic exercise   OT Goals Acute Rehab OT Goals OT Goal Formulation: With patient Time For Goal Achievement: 03/21/13 Potential to Achieve Goals: Good ADL Goals Pt Will Perform Grooming: Unsupported;Sitting at sink;Standing at sink (min guard A) ADL Goal: Grooming - Progress: Goal set today Pt Will Transfer to Toilet: with supervision;Squat pivot transfer;Stand pivot transfer;3-in-1 ADL Goal: Toilet Transfer - Progress: Goal set today Pt Will Perform Toileting - Clothing Manipulation: with supervision;Standing ADL Goal: Toileting - Clothing Manipulation - Progress: Goal set today Pt Will Perform Toileting - Hygiene: with supervision;Sit to stand from 3-in-1/toilet ADL Goal: Toileting - Hygiene - Progress: Goal set today Arm Goals Pt Will Perform AROM: with supervision, verbal cues required/provided;1 set;10 reps;Left upper extremity;to maintain range of motion Arm Goal: AROM - Progress: Goal set today Miscellaneous OT Goals Miscellaneous OT Goal #1: Pt will be Mod I in and OOB for BADLs. OT Goal: Miscellaneous Goal #1 - Progress: Goal set today  Visit Information  Last OT Received On: 03/07/13 Assistance Needed: +2 (for ambulation)    Subjective Data  Subjective: I'm ok" (when asked how are you today)   Prior Functioning     Home Living Lives With: Daughter;Son Available Help at Discharge: Family (per daughter someone is  almost always in the house with pt) Type of Home: House Home Access:  Stairs to enter [employer] of Steps: 2-3 Entrance Stairs-Rails: Left Home Layout: One level Bathroom Shower/Tub: Tub/shower unit;Curtain Firefighter: Standard Home Adaptive Equipment: Walker - rolling;Bedside commode/3-in-1;Hospital bed;Shower chair with back Prior Function Level of Independence: Independent with assistive device(s);Needs assistance Needs Assistance: Meal Prep;Light Housekeeping Meal Prep: Total Light Housekeeping: Total Able to Take Stairs?: No Driving: No Vocation: Retired Musician: No difficulties Dominant Hand: Right         Vision/Perception Vision - History Baseline Vision: Legally blind (right eye) Patient Visual Report: No change from baseline (tends to keep right eye shut)   Cognition  Cognition Arousal/Alertness: Awake/alert Behavior During Therapy: WFL for tasks assessed/performed Overall Cognitive Status: Within Functional Limits for tasks assessed    Extremity/Trunk Assessment Right Upper Extremity Assessment RUE ROM/Strength/Tone: Within functional levels Left Upper Extremity Assessment LUE ROM/Strength/Tone: Deficits LUE ROM/Strength/Tone Deficits: mild drift and slower compared to right with shoulder flexion, decreased FM control compared to the right     Mobility Bed Mobility Bed Mobility: Supine to Sit;Sitting - Scoot to [place] of Bed;Sit to Supine;Scooting to [facility] Rolling Right: 4: Min assist Right Sidelying to Sit: 4: Min assist Supine to Sit: 4: Min guard;With rails;HOB elevated (30 degrees) Sitting - Scoot to Edge of Bed: 4: Min guard;With rail Sit to Supine: 4: Min assist;HOB flat;With rail Scooting to [facility]: 4: Min guard;With rail (trendelenberg) Details for Bed Mobility Assistance: movements are not slow and controlled, they are "all or nothing" grading of movement is absent with bed mobility. Transfers Transfers: Sit to  Stand;Stand to Sit Sit to Stand: 4: Min assist;With upper extremity assist;From bed Stand to Sit: 4: Min assist;With upper extremity assist;To bed Details for Transfer Assistance: VCs needed for safe hand placement        Balance Balance Balance Assessed: Yes Static Sitting Balance Static Sitting - Balance Support: Right upper extremity supported;Feet supported Static Sitting - Level of Assistance:  (min guard A) Static Standing Balance Static Standing - Balance Support: Bilateral upper extremity supported Static Standing - Level of Assistance: 3: Mod assist   End of Session OT - End of Session Equipment Utilized During Treatment: Gait belt Activity Tolerance: Patient tolerated treatment well Patient left: in bed;with call bell/phone within reach;with nursing in room Nurse Communication:  (Please pass on for pt to be up at least 2x each weekend day)       Evette Georges [phone] 03/07/2013, 5:42 PM

## 2013-03-07 NOTE — Progress Notes (Signed)
   CARE MANAGEMENT NOTE 03/07/2013  Patient:  Brad Harrell, Brad Harrell   Account Number:  [id number]  Date Initiated:  03/06/2013  Documentation initiated by:  Darlyne Russian  Subjective/Objective Assessment:   Patient admitted with abdominal pain.  Found to have liver abscess and IR placed drain.  Plan discharge to home with long term IV antibiotics.     Action/Plan:   Progression of care and discharge planning   Anticipated DC Date:     Anticipated DC Plan:  HOME W HOME HEALTH SERVICES      DC Planning Services  CM consult      Choice offered to / List presented to:  C-1 Patient        HH arranged  IV Antibiotics      HH agency  [facility]   Status of service:  In process, will continue to follow Medicare Important Message given?   (If response is "NO", the following Medicare IM given date fields will be blank) Date Medicare IM given:   Date Additional Medicare IM given:    Discharge Disposition:    Per UR Regulation:    If discussed at Long Length of Stay Meetings, dates discussed:    Comments:  03/07/2013  1000 Darlyne Russian RN,CCM  [phone] Long term IV antibiotics  Patient selected [facility] for home care services.  [facility]; called with referral for home IV antibiotics. Awaiting specific orders for medication.

## 2013-03-08 DIAGNOSIS — D509 Iron deficiency anemia, unspecified: Secondary | ICD-10-CM | POA: Diagnosis present

## 2013-03-08 DIAGNOSIS — K750 Abscess of liver: Principal | ICD-10-CM

## 2013-03-08 DIAGNOSIS — D649 Anemia, unspecified: Secondary | ICD-10-CM

## 2013-03-08 LAB — BASIC METABOLIC PANEL WITH GFR
BUN: 15 mg/dL (ref 6–23)
CO2: 27 meq/L (ref 19–32)
Calcium: 8.9 mg/dL (ref 8.4–10.5)
Chloride: 103 meq/L (ref 96–112)
Creatinine, Ser: 1.14 mg/dL (ref 0.50–1.35)
GFR calc Af Amer: 74 mL/min — ABNORMAL LOW
GFR calc non Af Amer: 64 mL/min — ABNORMAL LOW
Glucose, Bld: 126 mg/dL — ABNORMAL HIGH (ref 70–99)
Potassium: 4 meq/L (ref 3.5–5.1)
Sodium: 139 meq/L (ref 135–145)

## 2013-03-08 LAB — CBC
HCT: 29 % — ABNORMAL LOW (ref 39.0–52.0)
Hemoglobin: 9.5 g/dL — ABNORMAL LOW (ref 13.0–17.0)
MCH: 26.6 pg (ref 26.0–34.0)
MCHC: 32.8 g/dL (ref 30.0–36.0)
MCV: 81.2 fL (ref 78.0–100.0)
Platelets: 262 10*3/uL (ref 150–400)
RBC: 3.57 MIL/uL — ABNORMAL LOW (ref 4.22–5.81)
RDW: 15.6 % — ABNORMAL HIGH (ref 11.5–15.5)
WBC: 9.2 10*3/uL (ref 4.0–10.5)

## 2013-03-08 LAB — TYPE AND SCREEN
ABO/RH(D): O POS
Antibody Screen: NEGATIVE
Unit division: 0
Unit division: 0

## 2013-03-08 LAB — CULTURE, ROUTINE-ABSCESS

## 2013-03-08 LAB — GLUCOSE, CAPILLARY
Glucose-Capillary: 114 mg/dL — ABNORMAL HIGH (ref 70–99)
Glucose-Capillary: 134 mg/dL — ABNORMAL HIGH (ref 70–99)
Glucose-Capillary: 139 mg/dL — ABNORMAL HIGH (ref 70–99)

## 2013-03-08 MED ORDER — SODIUM CHLORIDE 0.9 % IV SOLN
3.0000 g | Freq: Four times a day (QID) | INTRAVENOUS | Status: DC
  Administered 2013-03-08 – 2013-03-12 (×17): 3 g via INTRAVENOUS
  Filled 2013-03-08 (×23): qty 3

## 2013-03-08 NOTE — Progress Notes (Signed)
Subjective: Pt resting quietly; no acute changes; still constipated  Objective: Vital signs in last 24 hours: Temp:  [97.5 F (36.4 C)-99.6 F (37.6 C)] 97.5 F (36.4 C) (05/10 0955) Pulse Rate:  [74-87] 80 (05/10 0955) Resp:  [16-20] 16 (05/10 0955) BP: (102-117)/(57-77) 106/67 mmHg (05/10 0955) SpO2:  [96 %-98 %] 97 % (05/10 0955) Weight:  [182 lb 1.6 oz (82.6 kg)] 182 lb 1.6 oz (82.6 kg) (05/09 2202) Last BM Date:  (PTA)  Intake/Output from previous day: 05/09 0701 - 05/10 0700 In: 981.3 [P.O.:120; I.V.:500; Blood:356.3] Out: 670 [Urine:650; Drains:20] Intake/Output this shift: Total I/O In: 5 [Other:5] Out: 20 [Drains:20] Hepatic drain intact, insertion site ok, NT, output 20 cc's today, cx's - strept  Lab Results:   Recent Labs  03/07/13 0647 03/08/13 0817  WBC 11.2* 9.2  HGB 6.5* 9.5*  HCT 20.5* 29.0*  PLT 262 262   BMET  Recent Labs  03/07/13 0647 03/08/13 0817  NA 138 139  K 4.0 4.0  CL 102 103  CO2 25 27  GLUCOSE 107* 126*  BUN 18 15  CREATININE 1.17 1.14  CALCIUM 9.3 8.9   PT/INR No results found for this basename: LABPROT, INR,  in the last 72 hours ABG No results found for this basename: PHART, PCO2, PO2, HCO3,  in the last 72 hours Recent Results (from the past 240 hour(s))  URINE CULTURE     Status: None   Collection Time    03/04/13  2:15 PM      Result Value Range Status   Specimen Description URINE, CLEAN CATCH   Final   Special Requests NONE   Final   Culture  Setup Time 03/04/2013 15:56   Final   Colony Count 10,000 COLONIES/ML   Final   Culture     Final   Value: METHICILLIN RESISTANT STAPHYLOCOCCUS AUREUS     Note: RIFAMPIN AND GENTAMICIN SHOULD NOT BE USED AS SINGLE DRUGS FOR TREATMENT OF STAPH INFECTIONS. CRITICAL RESULT CALLED TO, READ BACK BY AND VERIFIED WITH: DENISE BEASON @ 0142 ON 03/07/2013   Report Status 03/07/2013 FINAL   Final   Organism ID, Bacteria METHICILLIN RESISTANT STAPHYLOCOCCUS AUREUS   Final   CULTURE, BLOOD (ROUTINE X 2)     Status: None   Collection Time    03/04/13  7:30 PM      Result Value Range Status   Specimen Description BLOOD ARM LEFT   Final   Special Requests BOTTLES DRAWN AEROBIC AND ANAEROBIC 10CC   Final   Culture  Setup Time 03/05/2013 01:20   Final   Culture     Final   Value:        BLOOD CULTURE RECEIVED NO GROWTH TO DATE CULTURE WILL BE HELD FOR 5 DAYS BEFORE ISSUING A FINAL NEGATIVE REPORT   Report Status PENDING   Incomplete  CULTURE, BLOOD (ROUTINE X 2)     Status: None   Collection Time    03/04/13  7:45 PM      Result Value Range Status   Specimen Description BLOOD HAND LEFT   Final   Special Requests BOTTLES DRAWN AEROBIC AND ANAEROBIC 10CC   Final   Culture  Setup Time 03/05/2013 01:19   Final   Culture     Final   Value: STAPHYLOCOCCUS SPECIES (COAGULASE NEGATIVE)     Note: THE SIGNIFICANCE OF ISOLATING THIS ORGANISM FROM A SINGLE SET OF BLOOD CULTURES WHEN MULTIPLE SETS ARE DRAWN IS UNCERTAIN. PLEASE NOTIFY THE MICROBIOLOGY DEPARTMENT  WITHIN ONE WEEK IF SPECIATION AND SENSITIVITIES ARE REQUIRED.     Note: Gram Stain Report Called to,Read Back By and Verified With: TINA ISAACS ON 03/06/2013 AT 12:40A BY WILEJ   Report Status 03/06/2013 FINAL   Final  ANAEROBIC CULTURE     Status: None   Collection Time    03/05/13 11:28 AM      Result Value Range Status   Specimen Description ABSCESS LIVER   Final   Special Requests NONE   Final   Gram Stain PENDING   Incomplete   Culture     Final   Value: NO ANAEROBES ISOLATED; CULTURE IN PROGRESS FOR 5 DAYS   Report Status PENDING   Incomplete  CULTURE, ROUTINE-ABSCESS     Status: None   Collection Time    03/05/13 11:28 AM      Result Value Range Status   Specimen Description ABSCESS LIVER   Final   Special Requests NONE   Final   Gram Stain     Final   Value: FEW WBC PRESENT,BOTH PMN AND MONONUCLEAR     NO SQUAMOUS EPITHELIAL CELLS SEEN     MODERATE GRAM VARIABLE ROD   Culture     Final   Value:  MODERATE MICROAEROPHILIC STREPTOCOCCI     Note: Standardized susceptibility testing for this organism is not available.   Report Status 03/08/2013 FINAL   Final    Studies/Results: No results found.  Anti-infectives: Anti-infectives   Start     Dose/Rate Route Frequency Ordered Stop   03/08/13 1200  Ampicillin-Sulbactam (UNASYN) 3 g in sodium chloride 0.9 % 100 mL IVPB     3 g 100 mL/hr over 60 Minutes Intravenous Every 6 hours 03/08/13 1129     03/06/13 1430  piperacillin-tazobactam (ZOSYN) IVPB 3.375 g  Status:  Discontinued     3.375 g 12.5 mL/hr over 240 Minutes Intravenous Every 8 hours 03/06/13 1409 03/08/13 1129   03/06/13 1000  vancomycin (VANCOCIN) IVPB 1000 mg/200 mL premix  Status:  Discontinued     1,000 mg 200 mL/hr over 60 Minutes Intravenous Every 12 hours 03/06/13 0133 03/06/13 1344   03/06/13 0200  vancomycin (VANCOCIN) 1,500 mg in sodium chloride 0.9 % 500 mL IVPB     1,500 mg 250 mL/hr over 120 Minutes Intravenous  Once 03/06/13 0133 03/06/13 0408   03/04/13 2200  imipenem-cilastatin (PRIMAXIN) 500 mg in sodium chloride 0.9 % 100 mL IVPB  Status:  Discontinued     500 mg 200 mL/hr over 30 Minutes Intravenous 3 times per day 03/04/13 1845 03/06/13 0738   03/04/13 1900  metroNIDAZOLE (FLAGYL) IVPB 500 mg  Status:  Discontinued     500 mg 100 mL/hr over 60 Minutes Intravenous Every 8 hours 03/04/13 1845 03/06/13 0738   03/04/13 1845  imipenem-cilastatin (PRIMAXIN) 500 mg in sodium chloride 0.9 % 100 mL IVPB  Status:  Discontinued     500 mg 200 mL/hr over 30 Minutes Intravenous  Once 03/04/13 1837 03/04/13 1845      Assessment/Plan: s/p hepatic abscess drainage 5/7; cont with drain irrigation; antibiotic tx per ID; check cytology on hepatic fluid; obtain f/u CT mid next week to assess adequacy of drainage  LOS: 4 days    ALLRED,D [facility] 03/08/2013

## 2013-03-08 NOTE — Progress Notes (Signed)
Patient ID: Brad Harrell, male   DOB: 1946/04/03, 67 y.o.   MRN: [id number]         [facility] for Infectious Disease    Date of Admission:  03/04/2013    Total days of antibiotics 5        Day 3 piperacillin tazobactam         Active Problems:   Cardiomyopathy   H/O cocaine abuse   Stroke   Liver abscess   Encephalopathy, metabolic   Anemia   Bacteremia   . carvedilol  6.25 mg Oral BID WC  . docusate sodium  100 mg Oral BID  . enoxaparin (LOVENOX) injection  40 mg Subcutaneous Daily  . feeding supplement  237 mL Oral BID BM  . multivitamin with minerals  1 tablet Oral Daily  . piperacillin-tazobactam  3.375 g Intravenous Q8H  . polyethylene glycol  17 g Oral Daily  . saccharomyces boulardii  250 mg Oral BID  . senna  1 tablet Oral BID  . sodium chloride  3 mL Intravenous Q12H   Objective: Temp:  [97.5 F (36.4 C)-99.6 F (37.6 C)] 97.5 F (36.4 C) (05/10 0955) Pulse Rate:  [74-87] 80 (05/10 0955) Resp:  [16-20] 16 (05/10 0955) BP: (102-117)/(57-77) 106/67 mmHg (05/10 0955) SpO2:  [96 %-98 %] 97 % (05/10 0955) Weight:  [82.6 kg (182 lb 1.6 oz)] 82.6 kg (182 lb 1.6 oz) (05/09 2202)  Liver abscess drain: 20 cc out so far today  Lab Results Lab Results  Component Value Date   WBC 9.2 03/08/2013   HGB 9.5* 03/08/2013   HCT 29.0* 03/08/2013   MCV 81.2 03/08/2013   PLT 262 03/08/2013    Lab Results  Component Value Date   CREATININE 1.14 03/08/2013   BUN 15 03/08/2013   NA 139 03/08/2013   K 4.0 03/08/2013   CL 103 03/08/2013   CO2 27 03/08/2013    Lab Results  Component Value Date   ALT 19 03/05/2013   AST 24 03/05/2013   ALKPHOS 103 03/05/2013   BILITOT 0.8 03/05/2013      Microbiology: Recent Results (from the past 240 hour(s))  URINE CULTURE     Status: None   Collection Time    03/04/13  2:15 PM      Result Value Range Status   Specimen Description URINE, CLEAN CATCH   Final   Special Requests NONE   Final   Culture  Setup Time 03/04/2013 15:56   Final     Colony Count 10,000 COLONIES/ML   Final   Culture     Final   Value: METHICILLIN RESISTANT STAPHYLOCOCCUS AUREUS     Note: RIFAMPIN AND GENTAMICIN SHOULD NOT BE USED AS SINGLE DRUGS FOR TREATMENT OF STAPH INFECTIONS. CRITICAL RESULT CALLED TO, READ BACK BY AND VERIFIED WITH: DENISE BEASON @ 0142 ON 03/07/2013   Report Status 03/07/2013 FINAL   Final   Organism ID, Bacteria METHICILLIN RESISTANT STAPHYLOCOCCUS AUREUS   Final  CULTURE, BLOOD (ROUTINE X 2)     Status: None   Collection Time    03/04/13  7:30 PM      Result Value Range Status   Specimen Description BLOOD ARM LEFT   Final   Special Requests BOTTLES DRAWN AEROBIC AND ANAEROBIC 10CC   Final   Culture  Setup Time 03/05/2013 01:20   Final   Culture     Final   Value:        BLOOD CULTURE RECEIVED NO GROWTH  TO DATE CULTURE WILL BE HELD FOR 5 DAYS BEFORE ISSUING A FINAL NEGATIVE REPORT   Report Status PENDING   Incomplete  CULTURE, BLOOD (ROUTINE X 2)     Status: None   Collection Time    03/04/13  7:45 PM      Result Value Range Status   Specimen Description BLOOD HAND LEFT   Final   Special Requests BOTTLES DRAWN AEROBIC AND ANAEROBIC 10CC   Final   Culture  Setup Time 03/05/2013 01:19   Final   Culture     Final   Value: STAPHYLOCOCCUS SPECIES (COAGULASE NEGATIVE)     Note: THE SIGNIFICANCE OF ISOLATING THIS ORGANISM FROM A SINGLE SET OF BLOOD CULTURES WHEN MULTIPLE SETS ARE DRAWN IS UNCERTAIN. PLEASE NOTIFY THE MICROBIOLOGY DEPARTMENT WITHIN ONE WEEK IF SPECIATION AND SENSITIVITIES ARE REQUIRED.     Note: Gram Stain Report Called to,Read Back By and Verified With: TINA ISAACS ON 03/06/2013 AT 12:40A BY WILEJ   Report Status 03/06/2013 FINAL   Final  ANAEROBIC CULTURE     Status: None   Collection Time    03/05/13 11:28 AM      Result Value Range Status   Specimen Description ABSCESS LIVER   Final   Special Requests NONE   Final   Gram Stain PENDING   Incomplete   Culture     Final   Value: NO ANAEROBES ISOLATED; CULTURE  IN PROGRESS FOR 5 DAYS   Report Status PENDING   Incomplete  CULTURE, ROUTINE-ABSCESS     Status: None   Collection Time    03/05/13 11:28 AM      Result Value Range Status   Specimen Description ABSCESS LIVER   Final   Special Requests NONE   Final   Gram Stain     Final   Value: FEW WBC PRESENT,BOTH PMN AND MONONUCLEAR     NO SQUAMOUS EPITHELIAL CELLS SEEN     MODERATE GRAM VARIABLE ROD   Culture     Final   Value: MODERATE MICROAEROPHILIC STREPTOCOCCI     Note: Standardized susceptibility testing for this organism is not available.   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 I will narrow and about coverage to ampicillin sulbactam. We can consider once daily IV ceftriaxone plus oral metronidazole upon discharge. The one positive blood culture with coag-negative staph is probably a contaminant. I also suspect that the MRSA in his urine culture reflects an asymptomatic colonizer or contaminant from his chronic indwelling Foley catheter.  Plan: 1. Change piperacillin tazobactam to ampicillin sulbactam 2. Dr. Ninetta Lights will followup on Monday, May 12  Cliffton Asters, MD [facility] for Infectious Disease [facility] [phone] pager   [phone] cell 03/08/2013, 11:25 AM

## 2013-03-08 NOTE — Consult Note (Signed)
Referring Provider: [facility] hospitalist Primary Care Physician:  Dorrene German, MD Primary Gastroenterologist:  NoneCorinda Gubler seeing covering for Hung/unassigned  Reason for Consultation: severe anemia/heme positive in setting of hepatic abscess  HPI: Brad Harrell is a 67 y.o. male with history of cardiomyopathy, last echo done 2010 showed anemia off of 30-35%. Patient has prior history of cocaine abuse but apparently has been abstinent over the past couple of years. He has history of prior CVA in may of 2014 and has been maintained on aspirin and Plavix He also has history of hypertension, hypothyroidism. He was admitted on 03/02/2013 with complaints of abdominal pain nausea and vomiting over the previous several days. He underwent CT scan on 03/04/2013 with IV and oral contrast and this showed a multiloculated cystic lesion in the liver question abscess versus neoplasm . The remainder of the CT was unremarkable including evaluation of the stomach and colon. He underwent IR drainage of the cystic lesion and was found to have purulent material which is growing gram-negative rods. Also has a coag negative bacteremia and coag negative staph and his urine . He is being followed by ID and is currently on Unasyn. He was also found to be anemic on admission and significantly iron deficient. Serum iron is 10 TIBC of 110 iron saturation of 9. Hemoglobin at its low was 6.5. Looking back through his labs hemoglobin was 13.4 and September of 2013 and 10.8 in October of 2013 . Patient does not offer  any verbal history though he nods  yes and no. His daughter is in the room and answers most of the questions .  He has not had any prior GI evaluation, has no prior history of iron deficiency or anemia and family history is negative for GI diseases as  far as they are aware. He denies  any abdominal pain other than soreness at the drain site. His appetite is fair-apparently has  been poor over the past few weeks  .   Past Medical History  Diagnosis Date  . Hypertension   . Urinary retention   . Hyperlipidemia   . Cardiomyopathy   . H/O cocaine abuse   . Hypothyroidism   . Stroke     "weaker on my left side since" (03/04/2013)  . Liver abscess     measuring 7 x 9 cm/notes 03/04/2013    Past Surgical History  Procedure Laterality Date  . Appendectomy    . Inguinal hernia repair Right   . Tibia fracture surgery Left     Prior to Admission medications   Medication Sig Start Date End Date Taking? Authorizing Provider  aspirin 81 MG chewable tablet Chew 81 mg by mouth daily.   Yes Historical Provider, MD  carvedilol (COREG) 3.125 MG tablet Take 3.125 mg by mouth 2 (two) times daily with a meal.   Yes Historical Provider, MD  clopidogrel (PLAVIX) 75 MG tablet Take 75 mg by mouth daily.   Yes Historical Provider, MD  doxazosin (CARDURA) 4 MG tablet Take 4 mg by mouth at bedtime.   Yes Historical Provider, MD  levothyroxine (SYNTHROID, LEVOTHROID) 100 MCG tablet Take 100 mcg by mouth daily.   Yes Historical Provider, MD  simvastatin (ZOCOR) 40 MG tablet Take 40 mg by mouth every evening.   Yes Historical Provider, MD    Current Facility-Administered Medications  Medication Dose Route Frequency Provider Last Rate Last Dose  . acetaminophen (TYLENOL) tablet 650 mg  650 mg Oral Q6H PRN Richarda Overlie, MD  650 mg at 03/05/13 1723  . Ampicillin-Sulbactam (UNASYN) 3 g in sodium chloride 0.9 % 100 mL IVPB  3 g Intravenous Q6H Cliffton Asters, MD      . carvedilol (COREG) tablet 6.25 mg  6.25 mg Oral BID WC Zannie Cove, MD   6.25 mg at 03/08/13 0805  . docusate sodium (COLACE) capsule 100 mg  100 mg Oral BID Richarda Overlie, MD   100 mg at 03/08/13 1018  . enoxaparin (LOVENOX) injection 40 mg  40 mg Subcutaneous Daily Zannie Cove, MD   40 mg at 03/08/13 1019  . feeding supplement (ENSURE COMPLETE) liquid 237 mL  237 mL Oral BID BM Haynes Bast, RD   237 mL at 03/08/13 1000  . levalbuterol (XOPENEX)  nebulizer solution 0.63 mg  0.63 mg Nebulization Q6H PRN Richarda Overlie, MD      . morphine 2 MG/ML injection 1 mg  1 mg Intravenous TID PRN Richarda Overlie, MD   1 mg at 03/07/13 1035  . multivitamin with minerals tablet 1 tablet  1 tablet Oral Daily Haynes Bast, RD   1 tablet at 03/08/13 1018  . ondansetron (ZOFRAN) injection 4 mg  4 mg Intravenous Q6H PRN Richarda Overlie, MD      . polyethylene glycol (MIRALAX / GLYCOLAX) packet 17 g  17 g Oral Daily Zannie Cove, MD   17 g at 03/08/13 1018  . saccharomyces boulardii (FLORASTOR) capsule 250 mg  250 mg Oral BID Zannie Cove, MD   250 mg at 03/08/13 1018  . senna (SENOKOT) tablet 8.6 mg  1 tablet Oral BID Richarda Overlie, MD   8.6 mg at 03/08/13 1018  . sodium chloride 0.9 % injection 3 mL  3 mL Intravenous Q12H Richarda Overlie, MD   3 mL at 03/06/13 1029    Allergies as of 03/04/2013  . (No Known Allergies)    History reviewed. No pertinent family history.  History   Social History  . Marital Status: Married    Spouse Name: N/A    Number of Children: N/A  . Years of Education: N/A   Occupational History  . Not on file.   Social History Main Topics  . Smoking status: Former Smoker -- 1.00 packs/day for 51 years    Types: Cigarettes  . Smokeless tobacco: Not on file     Comment: 03/04/2013 "quit smoking cigarettes over 2 yr ago"  . Alcohol Use: Yes     Comment: 03/04/2013 "last alcohol was ~ 2 yr ago too"  . Drug Use: Yes    Special: Cocaine     Comment: 03/04/2013 "last cocaine was 2 yr ago"  . Sexually Active: Not Currently   Other Topics Concern  . Not on file   Social History Narrative  . No narrative on file    Review of Systems: Pertinent positive and negative review of systems were noted in the above HPI section.  All other review of systems was otherwise negative.   Physical Exam: Vital signs in last 24 hours: Temp:  [97.5 F (36.4 C)-99.6 F (37.6 C)] 97.5 F (36.4 C) (05/10 0955) Pulse Rate:  [74-87] 80 (05/10  0955) Resp:  [16-20] 16 (05/10 0955) BP: (102-117)/(57-77) 106/67 mmHg (05/10 0955) SpO2:  [96 %-98 %] 97 % (05/10 0955) Weight:  [182 lb 1.6 oz (82.6 kg)] 182 lb 1.6 oz (82.6 kg) (05/09 2202) Last BM Date:  (PTA) General:   Alert,  Well-developed, chronically ill-appearing African American male, pleasant and cooperative in NAD  alert and not yes and no Head:  Normocephalic and atraumatic. Eyes:  Sclera clear, no icterus.   Conjunctiva pink. Ears:  Normal auditory acuity. Nose:  No deformity, discharge,  or lesions. Mouth:  No deformity or lesions.   Neck:  Supple; no masses or thyromegaly. Lungs:  Clear throughout to auscultation.   No wheezes, crackles, or rhonchi. Heart:  Regular rate and rhythm; no murmurs, clicks, rubs,  or gallops. Abdomen:  Soft,tender epigastrium and right upper quadrant, there is a drain in the right upper quadrant  with purulent drainage, BS active,nonpalp mass or hsm.   Rectal:  Deferred  Msk:  Symmetrical without gross deformities. . Pulses:  Normal pulses noted. Extremities:  Without clubbing or edema. Neurologic:  Alert and  oriented x2  left hemiparesis Skin:  Intact without significant lesions or rashes.. Psych:  Alert and cooperative. Normal mood and flat  affect.  Intake/Output from previous day: 05/09 0701 - 05/10 0700 In: 981.3 [P.O.:120; I.V.:500; Blood:356.3] Out: 670 [Urine:650; Drains:20] Intake/Output this shift: Total I/O In: 5 [Other:5] Out: 20 [Drains:20]  Lab Results:  Recent Labs  03/06/13 0435 03/07/13 0647 03/08/13 0817  WBC 11.5* 11.2* 9.2  HGB 7.3* 6.5* 9.5*  HCT 22.5* 20.5* 29.0*  PLT 262 262 262   BMET  Recent Labs  03/06/13 0435 03/07/13 0647 03/08/13 0817  NA 138 138 139  K 4.6 4.0 4.0  CL 102 102 103  CO2 22 25 27   GLUCOSE 131* 107* 126*  BUN 17 18 15   CREATININE 1.19 1.17 1.14  CALCIUM 8.8 9.3 8.9   l  Recent Labs  03/06/13 1424  HEPBSAG NEGATIVE  HCVAB NEGATIVE  HEPAIGM NEGATIVE  HEPBIGM  NEGATIVE     IMPRESSION:  #28 67 year old African American male with hepatic abscess-status post IR drainage on 03/05/2013, currently growing gram-negative rods #2 coag negative staph bacteremia-with coag negative staph in the urine culture #3 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 Dr. Elnoria Howard will see him on Monday and schedule for procedures early in the week-procedures were discussed with the patient and his daughter and they are agreeable to proceed.   Amy Esterwood  03/08/2013, 12:17 PM   GI ATTENDING  History, laboratories, x-rays reviewed. Patient personally reviewed and examined. Daughter and granddaughter in room. Agree with history and physical as outlined above. Patient admitted with symptomatic liver abscess. Growing a gram-negative rods. Also noted to be anemic, likely iron deficient. Concerns are for occult liver lesion. I think it is appropriate for him to undergo colonoscopy. Dr. Elnoria Howard will reassess on Monday. Probable colonoscopy early next week. Discussed with patient and family.  Wilhemina Bonito. Eda Keys., M.D. [facility] Division of Gastroenterology

## 2013-03-08 NOTE — Progress Notes (Addendum)
[facility] PROGRESS NOTE  GLENNON KOPKO ZOX:[id number] DOB: 10-21-1946 DOA: 03/04/2013 PCP: Dorrene German, MD  Assessment/Plan: 1. Liver abscess/cyst: -IR s/p aspiration and drain 5/7, Cultures-Gm stain with gram variable rod, - -Blood Cx with1/2 Coag negative staph: contaminant -Started Zosyn per Dr.Hatcher 5/8 -Appreciate ID consult -Source unclear, given severe Iron defi anemia and constipation/liver abscess, asked GI Dr.Perry to eval for colonoscopy, has never had one before.  2. Metabolic encephalopathy: - due to above -improved -MRI negative for CVA  3. H/o CVA -with residual hemiparesis -continue ASA/Plavix/statin  4. Anemia: - Anemia panel c/w Iron deficiency,  - no overt blood loss, transfused 2units PRBC 5/9, repeat Hb pending -colonoscopy inpatient if ok with GI  5. Constipation: On senokot/miralax  Ambulate, PT /OT  DVT proph: lovenox  Code Status: full  Family Communication: called and updated daughter  Disposition Plan: may need ST-rehab, CSW notified  Consultants: IR ID  Antibiotics:  IV zosyn 5/8  HPI/Subjective: Feels ok, constipated  Objective: Filed Vitals:   03/07/13 2010 03/07/13 2136 03/07/13 2202 03/08/13 0547  BP: 112/76 110/69 117/77 114/72  Pulse: 87 79 82 74  Temp: 98.5 F (36.9 C) 99.4 F (37.4 C) 98.1 F (36.7 C) 97.9 F (36.6 C)  TempSrc: Oral Oral Oral Oral  Resp: 16 17 18 16   Height:      Weight:   82.6 kg (182 lb 1.6 oz)   SpO2: 97%  97% 97%    Intake/Output Summary (Last 24 hours) at 03/08/13 0735 Last data filed at 03/08/13 0550  Gross per 24 hour  Intake 981.25 ml  Output    670 ml  Net 311.25 ml   Filed Weights   03/05/13 2008 03/06/13 2047 03/07/13 2202  Weight: 80.8 kg (178 lb 2.1 oz) 80.4 kg (177 lb 4 oz) 82.6 kg (182 lb 1.6 oz)    Exam:   General:  AAO to self and place  Cardiovascular: S1S2/RRR  Respiratory: CTAB  Abdomen: soft, mild diffuse tenderness , drain noted slightly  distended, BS present  Ext: mild residual L leg weakness   Data Reviewed: Basic Metabolic Panel:  Recent Labs Lab 03/04/13 1208 03/04/13 1255 03/04/13 1945 03/05/13 0801 03/06/13 0435 03/07/13 0647  NA 135 139  --  134* 138 138  K 4.5 4.5  --  4.3 4.6 4.0  CL 96 104  --  100 102 102  CO2 26  --   --  25 22 25   GLUCOSE 144* 144*  --  120* 131* 107*  BUN 20 22  --  15 17 18   CREATININE 1.21 1.20  --  1.03 1.19 1.17  CALCIUM 9.7  --   --  8.9 8.8 9.3  MG  --   --  2.5  --   --   --   PHOS  --   --  4.1  --   --   --    Liver Function Tests:  Recent Labs Lab 03/04/13 1208 03/05/13 0801  AST 34 24  ALT 23 19  ALKPHOS 115 103  BILITOT 0.7 0.8  PROT 9.1* 7.7  ALBUMIN 2.7* 2.2*   No results found for this basename: LIPASE, AMYLASE,  in the last 168 hours  Recent Labs Lab 03/04/13 1920  AMMONIA 23   CBC:  Recent Labs Lab 03/04/13 1208 03/04/13 1255 03/04/13 1945 03/05/13 0801 03/06/13 0435 03/07/13 0647  WBC 13.8*  --  12.6* 10.6* 11.5* 11.2*  NEUTROABS 10.5*  --   --   --   --   --  HGB 8.0* 8.5* 8.3* 7.1* 7.3* 6.5*  HCT 25.0* 25.0* 25.2* 21.6* 22.5* 20.5*  MCV 79.6  --  79.2 78.5 78.4 79.8  PLT 323  --  316 252 262 262   Cardiac Enzymes:  Recent Labs Lab 03/04/13 1937 03/05/13 0054  TROPONINI <0.30 <0.30   BNP (last 3 results) No results found for this basename: PROBNP,  in the last 8760 hours CBG:  Recent Labs Lab 03/07/13 0714 03/07/13 1140 03/07/13 1804 03/08/13 0011 03/08/13 0548  GLUCAP 102* 150* 188* 114* 106*    Recent Results (from the past 240 hour(s))  URINE CULTURE     Status: None   Collection Time    03/04/13  2:15 PM      Result Value Range Status   Specimen Description URINE, CLEAN CATCH   Final   Special Requests NONE   Final   Culture  Setup Time 03/04/2013 15:56   Final   Colony Count 10,000 COLONIES/ML   Final   Culture     Final   Value: METHICILLIN RESISTANT STAPHYLOCOCCUS AUREUS     Note: RIFAMPIN AND  GENTAMICIN SHOULD NOT BE USED AS SINGLE DRUGS FOR TREATMENT OF STAPH INFECTIONS. CRITICAL RESULT CALLED TO, READ BACK BY AND VERIFIED WITH: DENISE BEASON @ 0142 ON 03/07/2013   Report Status 03/07/2013 FINAL   Final   Organism ID, Bacteria METHICILLIN RESISTANT STAPHYLOCOCCUS AUREUS   Final  CULTURE, BLOOD (ROUTINE X 2)     Status: None   Collection Time    03/04/13  7:30 PM      Result Value Range Status   Specimen Description BLOOD ARM LEFT   Final   Special Requests BOTTLES DRAWN AEROBIC AND ANAEROBIC 10CC   Final   Culture  Setup Time 03/05/2013 01:20   Final   Culture     Final   Value:        BLOOD CULTURE RECEIVED NO GROWTH TO DATE CULTURE WILL BE HELD FOR 5 DAYS BEFORE ISSUING A FINAL NEGATIVE REPORT   Report Status PENDING   Incomplete  CULTURE, BLOOD (ROUTINE X 2)     Status: None   Collection Time    03/04/13  7:45 PM      Result Value Range Status   Specimen Description BLOOD HAND LEFT   Final   Special Requests BOTTLES DRAWN AEROBIC AND ANAEROBIC 10CC   Final   Culture  Setup Time 03/05/2013 01:19   Final   Culture     Final   Value: STAPHYLOCOCCUS SPECIES (COAGULASE NEGATIVE)     Note: THE SIGNIFICANCE OF ISOLATING THIS ORGANISM FROM A SINGLE SET OF BLOOD CULTURES WHEN MULTIPLE SETS ARE DRAWN IS UNCERTAIN. PLEASE NOTIFY THE MICROBIOLOGY DEPARTMENT WITHIN ONE WEEK IF SPECIATION AND SENSITIVITIES ARE REQUIRED.     Note: Gram Stain Report Called to,Read Back By and Verified With: TINA ISAACS ON 03/06/2013 AT 12:40A BY WILEJ   Report Status 03/06/2013 FINAL   Final  ANAEROBIC CULTURE     Status: None   Collection Time    03/05/13 11:28 AM      Result Value Range Status   Specimen Description ABSCESS LIVER   Final   Special Requests NONE   Final   Gram Stain PENDING   Incomplete   Culture     Final   Value: NO ANAEROBES ISOLATED; CULTURE IN PROGRESS FOR 5 DAYS   Report Status PENDING   Incomplete  CULTURE, ROUTINE-ABSCESS     Status: None   Collection Time  03/05/13  11:28 AM      Result Value Range Status   Specimen Description ABSCESS LIVER   Final   Special Requests NONE   Final   Gram Stain     Final   Value: FEW WBC PRESENT,BOTH PMN AND MONONUCLEAR     NO SQUAMOUS EPITHELIAL CELLS SEEN     MODERATE GRAM VARIABLE ROD   Culture Culture reincubated for better growth   Final   Report Status PENDING   Incomplete     Studies: No results found.  Scheduled Meds: . carvedilol  6.25 mg Oral BID WC  . docusate sodium  100 mg Oral BID  . enoxaparin (LOVENOX) injection  40 mg Subcutaneous Daily  . feeding supplement  237 mL Oral BID BM  . multivitamin with minerals  1 tablet Oral Daily  . piperacillin-tazobactam  3.375 g Intravenous Q8H  . polyethylene glycol  17 g Oral Daily  . saccharomyces boulardii  250 mg Oral BID  . senna  1 tablet Oral BID  . sodium chloride  3 mL Intravenous Q12H   Continuous Infusions:    Active Problems:   Cardiomyopathy   H/O cocaine abuse   Stroke   Liver abscess   Encephalopathy, metabolic   Anemia   Bacteremia    Time spent:    [facility]  [facility] Pager [phone]. If 7PM-7AM, please contact night-coverage at [email or web address], password [facility] 03/08/2013, 7:35 AM  LOS: 4 days

## 2013-03-09 LAB — BASIC METABOLIC PANEL
BUN: 11 mg/dL (ref 6–23)
Calcium: 8.7 mg/dL (ref 8.4–10.5)
Creatinine, Ser: 0.89 mg/dL (ref 0.50–1.35)
GFR calc Af Amer: >90 mL/min (ref >90)
GFR calc non Af Amer: 85 mL/min — ABNORMAL LOW (ref >90)
Glucose, Bld: 116 mg/dL — ABNORMAL HIGH (ref 70–99)
Potassium: 4 mEq/L (ref 3.5–5.1)

## 2013-03-09 LAB — CBC
HCT: 28.3 % — ABNORMAL LOW (ref 39.0–52.0)
MCH: 26.1 pg (ref 26.0–34.0)
MCHC: 32.2 g/dL (ref 30.0–36.0)
MCV: 81.3 fL (ref 78.0–100.0)
RDW: 16 % — ABNORMAL HIGH (ref 11.5–15.5)

## 2013-03-10 DIAGNOSIS — I6350 Cerebral infarction due to unspecified occlusion or stenosis of unspecified cerebral artery: Secondary | ICD-10-CM

## 2013-03-10 DIAGNOSIS — I6330 Cerebral infarction due to thrombosis of unspecified cerebral artery: Secondary | ICD-10-CM

## 2013-03-10 LAB — BASIC METABOLIC PANEL
BUN: 10 mg/dL (ref 6–23)
CO2: 26 mEq/L (ref 19–32)
Calcium: 8.9 mg/dL (ref 8.4–10.5)
GFR calc non Af Amer: 88 mL/min — ABNORMAL LOW (ref >90)
Glucose, Bld: 124 mg/dL — ABNORMAL HIGH (ref 70–99)

## 2013-03-10 LAB — CBC
HCT: 28.6 % — ABNORMAL LOW (ref 39.0–52.0)
Hemoglobin: 9.2 g/dL — ABNORMAL LOW (ref 13.0–17.0)
MCH: 26.5 pg (ref 26.0–34.0)
MCHC: 32.2 g/dL (ref 30.0–36.0)
MCV: 82.4 fL (ref 78.0–100.0)
RBC: 3.47 MIL/uL — ABNORMAL LOW (ref 4.22–5.81)

## 2013-03-10 MED ORDER — PEG 3350-KCL-NA BICARB-NACL 420 G PO SOLR
4000.0000 mL | Freq: Once | ORAL | Status: AC
  Administered 2013-03-10: 4000 mL via ORAL
  Filled 2013-03-10: qty 4000

## 2013-03-10 NOTE — Progress Notes (Signed)
I met with patient at bedside. He could benefit from an inpatient rehab admission prior to d/c home. He has previously been to inpt rehab and did well. I will contact his daughter and discuss with her also. Noted plans for colonoscopy. I will follow up tomorrow. [phone]

## 2013-03-10 NOTE — Consult Note (Signed)
Physical Medicine and Rehabilitation Consult  Reason for Consult: Hepatic abscess, h/o CVA with worsening of left hemiparesis.  Referring Physician:  Dr.Joseph   HPI: Brad Harrell is a 67 y.o. male with history of CVA with left hemiparesis, non-ischemic CM, h/o cocaine abuse, chronic foley for retention; admitted on 03/04/13 with complaints of abdominal pain, N/V, right facial weakness and lethargy. MRI with remote right thalamic infarct and right superior frontal infarct and no acute findings. CT abdomen with 7.6 cm liver abscess that was treated with drain placement by IVR. He was started on broad spectrum antibiotics and Dr. Ninetta Lights was consulted for input. 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 He felt that one blood culture positive for staph likely contaminant and UC with MRSA due to colonization. Antibiotics narrowed to ampicillin sulbactam. GI consulted for workup of iron deficiency anemia and due to concerns of occult liver lesion. Patient to have colonoscopy and EGD by Dr. Elnoria Howard.  Therapy evaluations done and OT recommending CIR.    Review of Systems  Eyes: Positive for blurred vision (blind right eye) and double vision.  Respiratory: Negative for cough and shortness of breath.   Cardiovascular: Negative for chest pain and palpitations.  Gastrointestinal: Positive for abdominal pain. Negative for nausea and vomiting.  Genitourinary: Negative for urgency and frequency.  Musculoskeletal: Negative for myalgias.  Neurological: Positive for weakness. Negative for dizziness.   Past Medical History  Diagnosis Date  . Hypertension   . Urinary retention   . Hyperlipidemia   . Cardiomyopathy   . H/O cocaine abuse   . Hypothyroidism   . Stroke     "weaker on my left side since" (03/04/2013)  . Liver abscess     measuring 7 x 9 cm/notes 03/04/2013   Past Surgical History  Procedure Laterality Date  . Appendectomy    . Inguinal hernia repair Right   .  Tibia fracture surgery Left    History reviewed. No pertinent family history.  Social History:  reports that he has quit smoking. His smoking use included Cigarettes. He has a 51 pack-year smoking history. He does not have any smokeless tobacco history on file. He reports that  drinks alcohol. He reports that he uses illicit drugs (Cocaine).  Allergies: No Known Allergies  Medications Prior to Admission  Medication Sig Dispense Refill  . aspirin 81 MG chewable tablet Chew 81 mg by mouth daily.      . carvedilol (COREG) 3.125 MG tablet Take 3.125 mg by mouth 2 (two) times daily with a meal.      . clopidogrel (PLAVIX) 75 MG tablet Take 75 mg by mouth daily.      Marland Kitchen doxazosin (CARDURA) 4 MG tablet Take 4 mg by mouth at bedtime.      Marland Kitchen levothyroxine (SYNTHROID, LEVOTHROID) 100 MCG tablet Take 100 mcg by mouth daily.      . simvastatin (ZOCOR) 40 MG tablet Take 40 mg by mouth every evening.        Home: Home Living Lives With: Daughter;Son Available Help at Discharge: Family (per daughter someone is almost always in the house with pt) Type of Home: House Home Access: Stairs to enter [employer] of Steps: 2-3 Entrance Stairs-Rails: Left Home Layout: One level Bathroom Shower/Tub: Forensic scientist: Standard Home Adaptive Equipment: Walker - rolling;Bedside commode/3-in-1;Hospital bed;Shower chair with back  Functional History: Prior Function Meal Prep: Total Light Housekeeping: Total Able to Take Stairs?: No Driving: No Vocation: Retired Functional Status:  Mobility: Bed  Mobility Bed Mobility: Supine to Sit;Sitting - Scoot to [employer] of Bed;Sit to Supine;Scooting to [facility] Rolling Right: 4: Min assist Right Sidelying to Sit: 4: Min assist Supine to Sit: 4: Min guard;With rails;HOB elevated (30 degrees) Sitting - Scoot to Edge of Bed: 4: Min guard;With rail Sit to Supine: 4: Min assist;HOB flat;With rail Scooting to [facility]: 4: Min guard;With rail  (trendelenberg) Transfers Transfers: Sit to Stand;Stand to Sit Sit to Stand: 4: Min assist;With upper extremity assist;From bed Stand to Sit: 4: Min assist;With upper extremity assist;To bed Ambulation/Gait General Gait Details: did not test as nursing to start blood transfusion    ADL: ADL Eating/Feeding: Simulated;Set up Where Assessed - Eating/Feeding: Bed level Grooming: Simulated;Minimal assistance Where Assessed - Grooming: Supported sitting Upper Body Bathing: Simulated;Minimal assistance Where Assessed - Upper Body Bathing: Supported sitting Lower Body Bathing: Simulated;Moderate assistance Where Assessed - Lower Body Bathing: Supported sit to stand Upper Body Dressing: Minimal assistance Where Assessed - Upper Body Dressing: Supported sitting Lower Body Dressing: Simulated;Maximal assistance Where Assessed - Lower Body Dressing: Supported sit to Pharmacist, hospital: Electronics engineer Method: Surveyor, minerals:  (Bed to chair beside bed) Equipment Used: Gait belt Transfers/Ambulation Related to ADLs: Min A sit to stand, Max A stand pivot to the left (left side has always been the weaker side per pt's daughter) ADL Comments: Pt was I/Mod I with all BADLs pta. Daughter reports that the most the patient does if no one is in the house with him is get up to his [facility].  Cognition: Cognition Overall Cognitive Status: Within Functional Limits for tasks assessed Arousal/Alertness: Awake/alert Orientation Level: Oriented to person;Oriented to place;Disoriented to time;Oriented to situation Cognition Arousal/Alertness: Awake/alert Behavior During Therapy: WFL for tasks assessed/performed Overall Cognitive Status: Within Functional Limits for tasks assessed  Blood pressure 124/74, pulse 80, temperature 97.7 F (36.5 C), temperature source Oral, resp. rate 16, height 5\' 4"  (1.626 m), weight 82.1 kg (181 lb), SpO2 99.00%.  Physical  Exam  Nursing note and vitals reviewed. Constitutional: He is oriented to person, place, and time. He appears well-developed and well-nourished.  HENT:  Head: Normocephalic and atraumatic.  Eyes:  R-ptosis  Neck: Normal range of motion.  Cardiovascular: Normal rate and regular rhythm.   Pulmonary/Chest: Effort normal and breath sounds normal.  Abdominal: Soft. Bowel sounds are normal. He exhibits no distension. There is no tenderness.  Neurological: He is alert and oriented to person, place, and time.  Blind right eye with ptosis. Left sided weakness with decrease in fine motor control. Generally 4/5 Left upper and 4- to 4/5 LLE. Sensation with mild diminishment Left arm and leg to FT. Limited insight and awareness.   Skin: Skin is warm and dry.  Psychiatric:  Flat     Results for orders placed during the hospital encounter of 03/04/13 (from the past 24 hour(s))  GLUCOSE, CAPILLARY     Status: Abnormal   Collection Time    03/09/13  3:17 PM      Result Value Range   Glucose-Capillary 125 (*) 70 - 99 mg/dL  CBC     Status: Abnormal   Collection Time    03/10/13  6:00 AM      Result Value Range   WBC 8.4  4.0 - 10.5 K/uL   RBC 3.47 (*) 4.22 - 5.81 MIL/uL   Hemoglobin 9.2 (*) 13.0 - 17.0 g/dL   HCT 16.1 (*) 09.6 - 04.5 %   MCV 82.4  78.0 -  100.0 fL   MCH 26.5  26.0 - 34.0 pg   MCHC 32.2  30.0 - 36.0 g/dL   RDW 16.1 (*) 09.6 - 04.5 %   Platelets 242  150 - 400 K/uL  BASIC METABOLIC PANEL     Status: Abnormal   Collection Time    03/10/13  6:00 AM      Result Value Range   Sodium 137  135 - 145 mEq/L   Potassium 4.0  3.5 - 5.1 mEq/L   Chloride 104  96 - 112 mEq/L   CO2 26  19 - 32 mEq/L   Glucose, Bld 124 (*) 70 - 99 mg/dL   BUN 10  6 - 23 mg/dL   Creatinine, Ser 4.09  0.50 - 1.35 mg/dL   Calcium 8.9  8.4 - 81.1 mg/dL   GFR calc non Af Amer 88 (*) >90 mL/min   GFR calc Af Amer >90  >90 mL/min   No results found.  Assessment/Plan: Diagnosis: deconditioning related to  liver abscess, multiple medical. Hx of prior cva's 1. Does the need for close, 24 hr/day medical supervision in concert with the patient's rehab needs make it unreasonable for this patient to be served in a less intensive setting? Yes 2. Co-Morbidities requiring supervision/potential complications: cm, anemia, htn 3. Due to bladder management, bowel management, safety, skin/wound care, disease management, medication administration, pain management and patient education, does the patient require 24 hr/day rehab nursing? Yes 4. Does the patient require coordinated care of a physician, rehab nurse, PT (1-2 hrs/day, 5 days/week) and OT (1-2 hrs/day, 5 days/week) to address physical and functional deficits in the context of the above medical diagnosis(es)? Yes Addressing deficits in the following areas: balance, endurance, locomotion, strength, transferring, bowel/bladder control, bathing, dressing, feeding, grooming, toileting and psychosocial support 5. Can the patient actively participate in an intensive therapy program of at least 3 hrs of therapy per day at least 5 days per week? Yes 6. The potential for patient to make measurable gains while on inpatient rehab is excellent 7. Anticipated functional outcomes upon discharge from inpatient rehab are mod I to supervision with PT, mod I to supervision with OT, n/a with SLP. 8. Estimated rehab length of stay to reach the above functional goals is: 7-10 days 9. Does the patient have adequate social supports to accommodate these discharge functional goals? Yes 10. Anticipated D/C setting: Home 11. Anticipated post D/C treatments: HH therapy 12. Overall Rehab/Functional Prognosis: excellent  RECOMMENDATIONS: This patient's condition is appropriate for continued rehabilitative care in the following setting: CIR Patient has agreed to participate in recommended program. Yes Note that insurance prior authorization may be required for reimbursement for  recommended care.  Comment:Rehab RN to follow up.   Ranelle Oyster, MD, [place]     03/10/2013

## 2013-03-10 NOTE — Progress Notes (Signed)
Unassigned patient. Subjective: Heme positive stools and anemia Patient is scheduled for an EGD and a colonoscopy tomorrow afternoon. Will prep tonight. Has multiple medica issues including liver abscess, cocaine abuse and cardiomyopathy. He denies any active GI problems like dysphagia or odynophagia. Has mild constipation.  Objective: Vital signs in last 24 hours: Temp:  [97.2 F (36.2 C)-97.8 F (36.6 C)] 97.8 F (36.6 C) (05/12 1437) Pulse Rate:  [80-86] 81 (05/12 1437) Resp:  [16-18] 16 (05/12 1437) BP: (116-146)/(66-86) 146/86 mmHg (05/12 1437) SpO2:  [97 %-99 %] 99 % (05/12 1437) Last BM Date:  (PTA)  Intake/Output from previous day: 05/11 0701 - 05/12 0700 In: 333 [P.O.:120; IV Piggyback:200] Out: 546 [Urine:525; Drains:20; Stool:1] Intake/Output this shift: Total I/O In: 10 [Other:10] Out: 790 [Urine:790]  General appearance: alert, cooperative, appears older than stated age and no distress Resp: clear to auscultation bilaterally Cardio: regular rate and rhythm, S1, S2 normal, no murmur, click, rub or gallop GI: soft, non-tender with hypoactive bowel sounds; no masses,  no organomegaly.  Lab Results:  Recent Labs  03/08/13 0817 03/09/13 0500 03/10/13 0600  WBC 9.2 8.7 8.4  HGB 9.5* 9.1* 9.2*  HCT 29.0* 28.3* 28.6*  PLT 262 260 242   BMET  Recent Labs  03/08/13 0817 03/09/13 0500 03/10/13 0600  NA 139 137 137  K 4.0 4.0 4.0  CL 103 103 104  CO2 27 23 26  GLUCOSE 126* 116* 124*  BUN 15 11 10  CREATININE 1.14 0.89 0.83  CALCIUM 8.9 8.7 8.9   Medications: I have reviewed the patient's current medications.  Assessment/Plan: 1) Iron deficiency anemia with heme positive stools: EGD and a colonoscopy planned for tomorrow. 2) Constipation on Senokot and Miralax. 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 4) Cardiomyopathy. 5) History of cocaine abuse. 6) S/P stroke.  LOS: 6 days   Christyne Mccain 03/10/2013, 6:03 PM   

## 2013-03-10 NOTE — Progress Notes (Signed)
Physical Therapy Treatment Patient Details Name: Brad Harrell MRN: [id number] DOB: June 14, 1946 Today's Date: 03/10/2013 Time: 1137-1202 PT Time Calculation (min): 25 min  PT Assessment / Plan / Recommendation Comments on Treatment Session  Making progress with mobility, with increased upright acitivity, and incr amb distance; Quite unsteady gait with tendency to lean Right    Follow Up Recommendations  CIR (will likely reach mod I level with CIR stay)     Does the patient have the potential to tolerate intense rehabilitation     Barriers to Discharge        Equipment Recommendations  None recommended by PT    Recommendations for Other Services OT consult  Frequency Min 3X/week   Plan Discharge plan remains appropriate    Precautions / Restrictions Precautions Precautions: Fall Precaution Comments: bulb drain on right side Restrictions Weight Bearing Restrictions: No   Pertinent Vitals/Pain no apparent distress     Mobility  Bed Mobility Bed Mobility: Supine to Sit;Sitting - Scoot to Edge of Bed Supine to Sit: 4: Min guard;With rails;HOB elevated Sitting - Scoot to Edge of Bed: 4: Min guard Details for Bed Mobility Assistance: movements are not slow and controlled, they are "all or nothing" grading of movement is absent with bed mobility. Transfers Transfers: Sit to Stand;Stand to Sit Sit to Stand: 4: Min guard;With upper extremity assist;From bed Stand to Sit: 4: Min guard;With upper extremity assist;With armrests;To chair/3-in-1 Details for Transfer Assistance: VCs needed for safe hand placement Ambulation/Gait Ambulation/Gait Assistance: 1: +2 Total assist Ambulation/Gait: Patient Percentage: 70% Ambulation Distance (Feet): 23 Feet (X 2) Assistive device: Rolling walker Ambulation/Gait Assistance Details: Decr standing/upright stability and unsteady gait Gait Pattern: Decreased stride length    Exercises     PT Diagnosis:    PT Problem List:   PT Treatment  Interventions:     PT Goals Acute Rehab PT Goals Time For Goal Achievement: 03/21/13 Potential to Achieve Goals: Good Pt will go Supine/Side to Sit: Independently PT Goal: Supine/Side to Sit - Progress: Progressing toward goal Pt will go Sit to Supine/Side: Independently PT Goal: Sit to Supine/Side - Progress: Progressing toward goal Pt will go Sit to Stand: with supervision PT Goal: Sit to Stand - Progress: Progressing toward goal Pt will go Stand to Sit: with supervision PT Goal: Stand to Sit - Progress: Progressing toward goal Pt will Ambulate: 51 - 150 feet;with min assist PT Goal: Ambulate - Progress: Progressing toward goal  Visit Information  Last PT Received On: 03/10/13 Assistance Needed: +1 (with +2 to follow with chair)    Subjective Data  Subjective: Pt says he does not want to go to SNF for rehab.  Patient Stated Goal: to go home   Cognition  Cognition Arousal/Alertness: Awake/alert Behavior During Therapy: WFL for tasks assessed/performed Overall Cognitive Status: Within Functional Limits for tasks assessed    Balance  Static Sitting Balance Static Sitting - Balance Support: Right upper extremity supported;Left upper extremity supported;Feet supported Static Sitting - Comment/# of Minutes: Pt with tendency to lean to the right in sitting. Asked him to lean more to his left and instead of leaning he would scoot left and forward. Tried to get him to lean over on his left forearm, but he would not--he felt like he was going to fall. Dynamic Standing Balance Dynamic Standing - Balance Support: Right upper extremity supported;During functional activity Dynamic Standing - Level of Assistance: 4: Min assist Dynamic Standing - Comments: Able to stand fro 1 minute with cues for  posture  End of Session PT - End of Session Equipment Utilized During Treatment: Gait belt Activity Tolerance: Patient tolerated treatment well Patient left: in chair;with chair alarm set;with  call bell/phone within reach Nurse Communication: Mobility status   GP     Van Clines [facility] [place], [place] [phone]  03/10/2013, 1:41 PM

## 2013-03-10 NOTE — Progress Notes (Signed)
[facility] PROGRESS NOTE  Brad Harrell OZH:[id number] DOB: [birth year] DOA: 03/04/2013 PCP: Dorrene German, MD  Assessment/Plan: 1. Liver abscess/cyst: -IR s/p aspiration and drain 5/7, Cultures-Gm stain with gram variable rod, - -Blood Cx with1/2 Coag negative staph: contaminant -Started Zosyn per Dr.Hatcher 5/8 -Appreciate ID consult -Source unclear, given severe Iron defi anemia and constipation/liver abscess, seen by  GI Dr.Perry , plan for colonoscopy per Dr.Hung  2. Metabolic encephalopathy: - due to above -improved -MRI negative for CVA  3. H/o CVA -with residual hemiparesis -continue ASA/Plavix/statin  4. Anemia: - Anemia panel c/w Iron deficiency,  - no overt blood loss, transfused 2units PRBC 5/9, repeat Hb pending -colonoscopy per Dr.Hung  5. Constipation: On senokot/miralax  Ambulate, PT /OT  DVT proph: lovenox  Code Status: full  Family Communication: called and updated daughter  Disposition Plan: may need ST-rehab, CSW notified  Consultants: IR ID  Antibiotics:  IV zosyn 5/8  HPI/Subjective: Feels ok, constipated  Objective: Filed Vitals:   03/09/13 1509 03/09/13 1756 03/09/13 1859 03/09/13 2115  BP: 109/64 140/82 116/78 121/66  Pulse: 78 87 83 86  Temp: 97.4 F (36.3 C) 98.9 F (37.2 C) 97.2 F (36.2 C) 97.8 F (36.6 C)  TempSrc: Oral Oral Oral Oral  Resp: 16 16 18 16   Height:      Weight:      SpO2: 99% 99% 98% 97%    Intake/Output Summary (Last 24 hours) at 03/10/13 0758 Last data filed at 03/09/13 1843  Gross per 24 hour  Intake    333 ml  Output    546 ml  Net   -213 ml   Filed Weights   03/06/13 2047 03/07/13 2202 03/08/13 2120  Weight: 80.4 kg (177 lb 4 oz) 82.6 kg (182 lb 1.6 oz) 82.1 kg (181 lb)    Exam:   General:  AAO to self and place  Cardiovascular: S1S2/RRR  Respiratory: CTAB  Abdomen: soft, mild diffuse tenderness , drain noted slightly distended, BS present  Ext: mild residual L leg  weakness   Data Reviewed: Basic Metabolic Panel:  Recent Labs Lab 03/04/13 1255 03/04/13 1945  03/06/13 0435 03/07/13 0647 03/08/13 0817 03/09/13 0500 03/10/13 0600  NA 139  --   < > 138 138 139 137 137  K 4.5  --   < > 4.6 4.0 4.0 4.0 4.0  CL 104  --   < > 102 102 103 103 104  CO2  --   --   < > 22 25 27 23 26   GLUCOSE 144*  --   < > 131* 107* 126* 116* 124*  BUN 22  --   < > 17 18 15 11 10   CREATININE 1.20  --   < > 1.19 1.17 1.14 0.89 0.83  CALCIUM  --   --   < > 8.8 9.3 8.9 8.7 8.9  MG  --  2.5  --   --   --   --   --   --   PHOS  --  4.1  --   --   --   --   --   --   < > = values in this interval not displayed. Liver Function Tests:  Recent Labs Lab 03/04/13 1208 03/05/13 0801  AST 34 24  ALT 23 19  ALKPHOS 115 103  BILITOT 0.7 0.8  PROT 9.1* 7.7  ALBUMIN 2.7* 2.2*   No results found for this basename: LIPASE, AMYLASE,  in the last  168 hours  Recent Labs Lab 03/04/13 1920  AMMONIA 23   CBC:  Recent Labs Lab 03/04/13 1208  03/06/13 0435 03/07/13 0647 03/08/13 0817 03/09/13 0500 03/10/13 0600  WBC 13.8*  < > 11.5* 11.2* 9.2 8.7 8.4  NEUTROABS 10.5*  --   --   --   --   --   --   HGB 8.0*  < > 7.3* 6.5* 9.5* 9.1* 9.2*  HCT 25.0*  < > 22.5* 20.5* 29.0* 28.3* 28.6*  MCV 79.6  < > 78.4 79.8 81.2 81.3 82.4  PLT 323  < > 262 262 262 260 242  < > = values in this interval not displayed. Cardiac Enzymes:  Recent Labs Lab 03/04/13 1937 03/05/13 0054  TROPONINI <0.30 <0.30   BNP (last 3 results) No results found for this basename: PROBNP,  in the last 8760 hours CBG:  Recent Labs Lab 03/08/13 1255 03/08/13 1839 03/09/13 0002 03/09/13 0732 03/09/13 1517  GLUCAP 134* 139* 148* 105* 125*    Recent Results (from the past 240 hour(s))  URINE CULTURE     Status: None   Collection Time    03/04/13  2:15 PM      Result Value Range Status   Specimen Description URINE, CLEAN CATCH   Final   Special Requests NONE   Final   Culture  Setup Time  03/04/2013 15:56   Final   Colony Count 10,000 COLONIES/ML   Final   Culture     Final   Value: METHICILLIN RESISTANT STAPHYLOCOCCUS AUREUS     Note: RIFAMPIN AND GENTAMICIN SHOULD NOT BE USED AS SINGLE DRUGS FOR TREATMENT OF STAPH INFECTIONS. CRITICAL RESULT CALLED TO, READ BACK BY AND VERIFIED WITH: DENISE BEASON @ 0142 ON 03/07/2013   Report Status 03/07/2013 FINAL   Final   Organism ID, Bacteria METHICILLIN RESISTANT STAPHYLOCOCCUS AUREUS   Final  CULTURE, BLOOD (ROUTINE X 2)     Status: None   Collection Time    03/04/13  7:30 PM      Result Value Range Status   Specimen Description BLOOD ARM LEFT   Final   Special Requests BOTTLES DRAWN AEROBIC AND ANAEROBIC 10CC   Final   Culture  Setup Time 03/05/2013 01:20   Final   Culture     Final   Value:        BLOOD CULTURE RECEIVED NO GROWTH TO DATE CULTURE WILL BE HELD FOR 5 DAYS BEFORE ISSUING A FINAL NEGATIVE REPORT   Report Status PENDING   Incomplete  CULTURE, BLOOD (ROUTINE X 2)     Status: None   Collection Time    03/04/13  7:45 PM      Result Value Range Status   Specimen Description BLOOD HAND LEFT   Final   Special Requests BOTTLES DRAWN AEROBIC AND ANAEROBIC 10CC   Final   Culture  Setup Time 03/05/2013 01:19   Final   Culture     Final   Value: STAPHYLOCOCCUS SPECIES (COAGULASE NEGATIVE)     Note: THE SIGNIFICANCE OF ISOLATING THIS ORGANISM FROM A SINGLE SET OF BLOOD CULTURES WHEN MULTIPLE SETS ARE DRAWN IS UNCERTAIN. PLEASE NOTIFY THE MICROBIOLOGY DEPARTMENT WITHIN ONE WEEK IF SPECIATION AND SENSITIVITIES ARE REQUIRED.     Note: Gram Stain Report Called to,Read Back By and Verified With: TINA ISAACS ON 03/06/2013 AT 12:40A BY WILEJ   Report Status 03/06/2013 FINAL   Final  ANAEROBIC CULTURE     Status: None   Collection Time  03/05/13 11:28 AM      Result Value Range Status   Specimen Description ABSCESS LIVER   Final   Special Requests NONE   Final   Gram Stain PENDING   Incomplete   Culture     Final   Value: NO  ANAEROBES ISOLATED; CULTURE IN PROGRESS FOR 5 DAYS   Report Status PENDING   Incomplete  CULTURE, ROUTINE-ABSCESS     Status: None   Collection Time    03/05/13 11:28 AM      Result Value Range Status   Specimen Description ABSCESS LIVER   Final   Special Requests NONE   Final   Gram Stain     Final   Value: FEW WBC PRESENT,BOTH PMN AND MONONUCLEAR     NO SQUAMOUS EPITHELIAL CELLS SEEN     MODERATE GRAM VARIABLE ROD   Culture     Final   Value: MODERATE MICROAEROPHILIC STREPTOCOCCI     Note: Standardized susceptibility testing for this organism is not available.   Report Status 03/08/2013 FINAL   Final  CULTURE, BLOOD (ROUTINE X 2)     Status: None   Collection Time    03/06/13  2:18 PM      Result Value Range Status   Specimen Description BLOOD LEFT ANTECUBITAL   Final   Special Requests BOTTLES DRAWN AEROBIC AND ANAEROBIC 10CC   Final   Culture  Setup Time 03/07/2013 00:16   Final   Culture     Final   Value:        BLOOD CULTURE RECEIVED NO GROWTH TO DATE CULTURE WILL BE HELD FOR 5 DAYS BEFORE ISSUING A FINAL NEGATIVE REPORT   Report Status PENDING   Incomplete     Studies: No results found.  Scheduled Meds: . ampicillin-sulbactam (UNASYN) IV  3 g Intravenous Q6H  . carvedilol  6.25 mg Oral BID WC  . docusate sodium  100 mg Oral BID  . enoxaparin (LOVENOX) injection  40 mg Subcutaneous Daily  . feeding supplement  237 mL Oral BID BM  . multivitamin with minerals  1 tablet Oral Daily  . polyethylene glycol  17 g Oral Daily  . saccharomyces boulardii  250 mg Oral BID  . senna  1 tablet Oral BID  . sodium chloride  3 mL Intravenous Q12H   Continuous Infusions:    Active Problems:   Cardiomyopathy   H/O cocaine abuse   Stroke   Liver abscess   Encephalopathy, metabolic   Anemia   Bacteremia   Anemia, iron deficiency    Time spent:    [facility]  [facility] Pager [phone]. If 7PM-7AM, please contact night-coverage at [email or web address],  password [facility] 03/10/2013, 7:58 AM  LOS: 6 days

## 2013-03-10 NOTE — Progress Notes (Addendum)
[facility] PROGRESS NOTE  Brad Harrell BJY:[id number] DOB: [birth year] DOA: 03/04/2013 PCP: Dorrene German, MD  Assessment/Plan: 1. Liver abscess/cyst: -IR s/p aspiration and drain 5/7, Cultures-Gm stain with gram variable rod and Microaerophilic strep -Blood Cx with1/2 Coag negative staph: contaminant -Started Zosyn per Dr.Hatcher 5/8, then changed to Unasyn per Dr.Campbell 5/10 -Appreciate ID consult -Source unclear, given severe Iron defi anemia and constipation/liver abscess, seen by  GI Dr.Perry Fowler covering for Dr.Hung , d/w Dr.Hung, plan for colonoscopy per Dr.Hung tomorrow  2. Metabolic encephalopathy: - due to above -improved -MRI negative for CVA  3. H/o CVA -with residual hemiparesis -continue ASA/Plavix/statin  4. Anemia:Iron deficiency,  -based on anemia panel -no overt blood loss, transfused 2units PRBC 5/9, repeat Hb improved -colonoscopy per Dr.Hung pending  5. Constipation: On senokot/miralax  Ambulate, PT /OT  DVT proph: lovenox  Code Status: full  Family Communication: called and updated daughter 5/11 Disposition Plan: CIR VS ST-Rehab  Consultants: IR ID  Antibiotics:  IV zosyn 5/8-5/10  IV Unasyn 5/10  HPI/Subjective: Feels ok, BM yesterday finally  Objective: Filed Vitals:   03/09/13 1756 03/09/13 1859 03/09/13 2115 03/10/13 0824  BP: 140/82 116/78 121/66 124/74  Pulse: 87 83 86 80  Temp: 98.9 F (37.2 C) 97.2 F (36.2 C) 97.8 F (36.6 C) 97.7 F (36.5 C)  TempSrc: Oral Oral Oral Oral  Resp: 16 18 16 16   Height:      Weight:      SpO2: 99% 98% 97% 99%    Intake/Output Summary (Last 24 hours) at 03/10/13 1037 Last data filed at 03/09/13 1843  Gross per 24 hour  Intake    328 ml  Output    546 ml  Net   -218 ml   Filed Weights   03/06/13 2047 03/07/13 2202 03/08/13 2120  Weight: 80.4 kg (177 lb 4 oz) 82.6 kg (182 lb 1.6 oz) 82.1 kg (181 lb)    Exam:   General:  AAO to self and place  Cardiovascular:  S1S2/RRR  Respiratory: CTAB  Abdomen: soft, mild diffuse tenderness , drain noted slightly distended, BS present  Ext: mild residual L leg weakness   Data Reviewed: Basic Metabolic Panel:  Recent Labs Lab 03/04/13 1255 03/04/13 1945  03/06/13 0435 03/07/13 0647 03/08/13 0817 03/09/13 0500 03/10/13 0600  NA 139  --   < > 138 138 139 137 137  K 4.5  --   < > 4.6 4.0 4.0 4.0 4.0  CL 104  --   < > 102 102 103 103 104  CO2  --   --   < > 22 25 27 23 26   GLUCOSE 144*  --   < > 131* 107* 126* 116* 124*  BUN 22  --   < > 17 18 15 11 10   CREATININE 1.20  --   < > 1.19 1.17 1.14 0.89 0.83  CALCIUM  --   --   < > 8.8 9.3 8.9 8.7 8.9  MG  --  2.5  --   --   --   --   --   --   PHOS  --  4.1  --   --   --   --   --   --   < > = values in this interval not displayed. Liver Function Tests:  Recent Labs Lab 03/04/13 1208 03/05/13 0801  AST 34 24  ALT 23 19  ALKPHOS 115 103  BILITOT 0.7 0.8  PROT 9.1*  7.7  ALBUMIN 2.7* 2.2*   No results found for this basename: LIPASE, AMYLASE,  in the last 168 hours  Recent Labs Lab 03/04/13 1920  AMMONIA 23   CBC:  Recent Labs Lab 03/04/13 1208  03/06/13 0435 03/07/13 0647 03/08/13 0817 03/09/13 0500 03/10/13 0600  WBC 13.8*  < > 11.5* 11.2* 9.2 8.7 8.4  NEUTROABS 10.5*  --   --   --   --   --   --   HGB 8.0*  < > 7.3* 6.5* 9.5* 9.1* 9.2*  HCT 25.0*  < > 22.5* 20.5* 29.0* 28.3* 28.6*  MCV 79.6  < > 78.4 79.8 81.2 81.3 82.4  PLT 323  < > 262 262 262 260 242  < > = values in this interval not displayed. Cardiac Enzymes:  Recent Labs Lab 03/04/13 1937 03/05/13 0054  TROPONINI <0.30 <0.30   BNP (last 3 results) No results found for this basename: PROBNP,  in the last 8760 hours CBG:  Recent Labs Lab 03/08/13 1255 03/08/13 1839 03/09/13 0002 03/09/13 0732 03/09/13 1517  GLUCAP 134* 139* 148* 105* 125*    Recent Results (from the past 240 hour(s))  URINE CULTURE     Status: None   Collection Time    03/04/13   2:15 PM      Result Value Range Status   Specimen Description URINE, CLEAN CATCH   Final   Special Requests NONE   Final   Culture  Setup Time 03/04/2013 15:56   Final   Colony Count 10,000 COLONIES/ML   Final   Culture     Final   Value: METHICILLIN RESISTANT STAPHYLOCOCCUS AUREUS     Note: RIFAMPIN AND GENTAMICIN SHOULD NOT BE USED AS SINGLE DRUGS FOR TREATMENT OF STAPH INFECTIONS. CRITICAL RESULT CALLED TO, READ BACK BY AND VERIFIED WITH: DENISE BEASON @ 0142 ON 03/07/2013   Report Status 03/07/2013 FINAL   Final   Organism ID, Bacteria METHICILLIN RESISTANT STAPHYLOCOCCUS AUREUS   Final  CULTURE, BLOOD (ROUTINE X 2)     Status: None   Collection Time    03/04/13  7:30 PM      Result Value Range Status   Specimen Description BLOOD ARM LEFT   Final   Special Requests BOTTLES DRAWN AEROBIC AND ANAEROBIC 10CC   Final   Culture  Setup Time 03/05/2013 01:20   Final   Culture     Final   Value:        BLOOD CULTURE RECEIVED NO GROWTH TO DATE CULTURE WILL BE HELD FOR 5 DAYS BEFORE ISSUING A FINAL NEGATIVE REPORT   Report Status PENDING   Incomplete  CULTURE, BLOOD (ROUTINE X 2)     Status: None   Collection Time    03/04/13  7:45 PM      Result Value Range Status   Specimen Description BLOOD HAND LEFT   Final   Special Requests BOTTLES DRAWN AEROBIC AND ANAEROBIC 10CC   Final   Culture  Setup Time 03/05/2013 01:19   Final   Culture     Final   Value: STAPHYLOCOCCUS SPECIES (COAGULASE NEGATIVE)     Note: THE SIGNIFICANCE OF ISOLATING THIS ORGANISM FROM A SINGLE SET OF BLOOD CULTURES WHEN MULTIPLE SETS ARE DRAWN IS UNCERTAIN. PLEASE NOTIFY THE MICROBIOLOGY DEPARTMENT WITHIN ONE WEEK IF SPECIATION AND SENSITIVITIES ARE REQUIRED.     Note: Gram Stain Report Called to,Read Back By and Verified With: TINA ISAACS ON 03/06/2013 AT 12:40A BY WILEJ   Report Status  03/06/2013 FINAL   Final  ANAEROBIC CULTURE     Status: None   Collection Time    03/05/13 11:28 AM      Result Value Range Status    Specimen Description ABSCESS LIVER   Final   Special Requests NONE   Final   Gram Stain PENDING   Incomplete   Culture     Final   Value: NO ANAEROBES ISOLATED; CULTURE IN PROGRESS FOR 5 DAYS   Report Status PENDING   Incomplete  CULTURE, ROUTINE-ABSCESS     Status: None   Collection Time    03/05/13 11:28 AM      Result Value Range Status   Specimen Description ABSCESS LIVER   Final   Special Requests NONE   Final   Gram Stain     Final   Value: FEW WBC PRESENT,BOTH PMN AND MONONUCLEAR     NO SQUAMOUS EPITHELIAL CELLS SEEN     MODERATE GRAM VARIABLE ROD   Culture     Final   Value: MODERATE MICROAEROPHILIC STREPTOCOCCI     Note: Standardized susceptibility testing for this organism is not available.   Report Status 03/08/2013 FINAL   Final  CULTURE, BLOOD (ROUTINE X 2)     Status: None   Collection Time    03/06/13  2:18 PM      Result Value Range Status   Specimen Description BLOOD LEFT ANTECUBITAL   Final   Special Requests BOTTLES DRAWN AEROBIC AND ANAEROBIC 10CC   Final   Culture  Setup Time 03/07/2013 00:16   Final   Culture     Final   Value:        BLOOD CULTURE RECEIVED NO GROWTH TO DATE CULTURE WILL BE HELD FOR 5 DAYS BEFORE ISSUING A FINAL NEGATIVE REPORT   Report Status PENDING   Incomplete     Studies: No results found.  Scheduled Meds: . ampicillin-sulbactam (UNASYN) IV  3 g Intravenous Q6H  . carvedilol  6.25 mg Oral BID WC  . docusate sodium  100 mg Oral BID  . enoxaparin (LOVENOX) injection  40 mg Subcutaneous Daily  . feeding supplement  237 mL Oral BID BM  . multivitamin with minerals  1 tablet Oral Daily  . polyethylene glycol  17 g Oral Daily  . saccharomyces boulardii  250 mg Oral BID  . senna  1 tablet Oral BID  . sodium chloride  3 mL Intravenous Q12H   Continuous Infusions:    Active Problems:   Cardiomyopathy   H/O cocaine abuse   Stroke   Liver abscess   Encephalopathy, metabolic   Anemia   Bacteremia   Anemia, iron  deficiency    Time spent:    [facility]  [facility] Pager [phone]. If 7PM-7AM, please contact night-coverage at [email or web address], password [facility] 03/10/2013, 10:37 AM  LOS: 6 days

## 2013-03-10 NOTE — Progress Notes (Signed)
Occupational Therapy Treatment Patient Details Name: Brad Harrell MRN: [id number] DOB: August 08, 1946 Today's Date: 03/10/2013 Time: 1137-1202 OT Time Calculation (min): 25 min  OT Assessment / Plan / Recommendation Comments on Treatment Session This 67 yo male making progress since eval. Will continue to benefit from acute OT with follow up on CIR.    Follow Up Recommendations  CIR       Equipment Recommendations  None recommended by OT       Frequency Min 2X/week   Plan Discharge plan remains appropriate    Precautions / Restrictions Precautions Precautions: Fall Precaution Comments: bulb drain on right side Restrictions Weight Bearing Restrictions: No       ADL  Grooming: Performed;Wash/dry hands;Wash/dry face;Supervision/safety;Set up (with Minimal A for standing balance) Where Assessed - Grooming: Unsupported standing Toilet Transfer: Simulated;Minimal assistance Acupuncturist:  (Bed>door>sit in recliner>bed>sit in recliner) Transfers/Ambulation Related to ADLs: Min guard A sit to stand and stand to sit due to decreased safety with hand placement and impulsiveness; min A for ambulation      OT Goals ADL Goals Pt Will Perform Grooming: with set-up;with supervision;Unsupported;Sitting at sink;Standing at sink ADL Goal: Grooming - Progress: Progressing toward goals ADL Goal: Toilet Transfer - Progress: Progressing toward goals Miscellaneous OT Goals OT Goal: Miscellaneous Goal #1 - Progress: Progressing toward goals  Visit Information  Last OT Received On: 03/10/13 Assistance Needed: +1 (with +2 to follow with chair) PT/OT Co-Evaluation/Treatment: Yes          Cognition  Cognition Arousal/Alertness: Awake/alert Behavior During Therapy: WFL for tasks assessed/performed Overall Cognitive Status:  (decreased safety with transfers, needs VCs)    Mobility  Bed Mobility Bed Mobility: Supine to Sit;Sitting - Scoot to Edge of Bed Supine to Sit: 4: Min  guard;With rails;HOB elevated (20 degrees) Sitting - Scoot to Edge of Bed: 4: Min guard Transfers Transfers: Sit to Stand;Stand to Sit Sit to Stand: 4: Min guard;With upper extremity assist;From bed Stand to Sit: 4: Min guard;With upper extremity assist;With armrests;To chair/3-in-1 Details for Transfer Assistance: VCs needed for safe hand placement       Balance Static Sitting Balance Static Sitting - Balance Support: Right upper extremity supported;Left upper extremity supported;Feet supported Static Sitting - Comment/# of Minutes: Pt with tendency to lean to the right in sitting. Asked him to lean more to his left and instead of leaning he would scoot left and forward. Tried to get him to lean over on his left forearm, but he would not--he felt like he was going to fall. Dynamic Standing Balance Dynamic Standing - Balance Support: Right upper extremity supported;During functional activity (washing hands and face at sink) Dynamic Standing - Level of Assistance: 4: Min assist Dynamic Standing - Comments: Pt able to stand 1 minute with VCs to stand up tall (his tendency was to collapse at his trunk)   End of Session OT - End of Session Equipment Utilized During Treatment: Gait belt (RW) Activity Tolerance: Patient tolerated treatment well Patient left: in chair;with call bell/phone within reach;with chair alarm set Nurse Communication: Mobility status       Evette Georges [phone] 03/10/2013, 12:19 PM

## 2013-03-10 NOTE — Progress Notes (Signed)
INFECTIOUS DISEASE PROGRESS NOTE  ID: Brad Harrell is a 67 y.o. male with  Active Problems:   Cardiomyopathy   H/O cocaine abuse   Stroke   Liver abscess   Encephalopathy, metabolic   Anemia   Bacteremia   Anemia, iron deficiency  Subjective: Without complaints  Abtx:  Anti-infectives   Start     Dose/Rate Route Frequency Ordered Stop   03/08/13 1200  Ampicillin-Sulbactam (UNASYN) 3 g in sodium chloride 0.9 % 100 mL IVPB     3 g 100 mL/hr over 60 Minutes Intravenous Every 6 hours 03/08/13 1129     03/06/13 1430  piperacillin-tazobactam (ZOSYN) IVPB 3.375 g  Status:  Discontinued     3.375 g 12.5 mL/hr over 240 Minutes Intravenous Every 8 hours 03/06/13 1409 03/08/13 1129   03/06/13 1000  vancomycin (VANCOCIN) IVPB 1000 mg/200 mL premix  Status:  Discontinued     1,000 mg 200 mL/hr over 60 Minutes Intravenous Every 12 hours 03/06/13 0133 03/06/13 1344   03/06/13 0200  vancomycin (VANCOCIN) 1,500 mg in sodium chloride 0.9 % 500 mL IVPB     1,500 mg 250 mL/hr over 120 Minutes Intravenous  Once 03/06/13 0133 03/06/13 0408   03/04/13 2200  imipenem-cilastatin (PRIMAXIN) 500 mg in sodium chloride 0.9 % 100 mL IVPB  Status:  Discontinued     500 mg 200 mL/hr over 30 Minutes Intravenous 3 times per day 03/04/13 1845 03/06/13 0738   03/04/13 1900  metroNIDAZOLE (FLAGYL) IVPB 500 mg  Status:  Discontinued     500 mg 100 mL/hr over 60 Minutes Intravenous Every 8 hours 03/04/13 1845 03/06/13 0738   03/04/13 1845  imipenem-cilastatin (PRIMAXIN) 500 mg in sodium chloride 0.9 % 100 mL IVPB  Status:  Discontinued     500 mg 200 mL/hr over 30 Minutes Intravenous  Once 03/04/13 1837 03/04/13 1845      Medications:  Scheduled: . ampicillin-sulbactam (UNASYN) IV  3 g Intravenous Q6H  . carvedilol  6.25 mg Oral BID WC  . docusate sodium  100 mg Oral BID  . enoxaparin (LOVENOX) injection  40 mg Subcutaneous Daily  . feeding supplement  237 mL Oral BID BM  . multivitamin with  minerals  1 tablet Oral Daily  . polyethylene glycol  17 g Oral Daily  . saccharomyces boulardii  250 mg Oral BID  . senna  1 tablet Oral BID  . sodium chloride  3 mL Intravenous Q12H    Objective: Vital signs in last 24 hours: Temp:  [97.2 F (36.2 C)-98.9 F (37.2 C)] 97.8 F (36.6 C) (05/12 1437) Pulse Rate:  [80-87] 81 (05/12 1437) Resp:  [16-18] 16 (05/12 1437) BP: (116-146)/(66-86) 146/86 mmHg (05/12 1437) SpO2:  [97 %-99 %] 99 % (05/12 1437)   General appearance: alert, cooperative and no distress Resp: clear to auscultation bilaterally Cardio: regular rate and rhythm GI: normal findings: bowel sounds normal and soft, non-tender, abnormal findings:  distended and drain in RUQ, minimal d/c in bulb.   Lab Results  Recent Labs  03/09/13 0500 03/10/13 0600  WBC 8.7 8.4  HGB 9.1* 9.2*  HCT 28.3* 28.6*  NA 137 137  K 4.0 4.0  CL 103 104  CO2 23 26  BUN 11 10  CREATININE 0.89 0.83   Liver Panel No results found for this basename: PROT, ALBUMIN, AST, ALT, ALKPHOS, BILITOT, BILIDIR, IBILI,  in the last 72 hours Sedimentation Rate No results found for this basename: ESRSEDRATE,  in the last  72 hours C-Reactive Protein No results found for this basename: CRP,  in the last 72 hours  Microbiology: Recent Results (from the past 240 hour(s))  URINE CULTURE     Status: None   Collection Time    03/04/13  2:15 PM      Result Value Range Status   Specimen Description URINE, CLEAN CATCH   Final   Special Requests NONE   Final   Culture  Setup Time 03/04/2013 15:56   Final   Colony Count 10,000 COLONIES/ML   Final   Culture     Final   Value: METHICILLIN RESISTANT STAPHYLOCOCCUS AUREUS     Note: RIFAMPIN AND GENTAMICIN SHOULD NOT BE USED AS SINGLE DRUGS FOR TREATMENT OF STAPH INFECTIONS. CRITICAL RESULT CALLED TO, READ BACK BY AND VERIFIED WITH: DENISE BEASON @ 0142 ON 03/07/2013   Report Status 03/07/2013 FINAL   Final   Organism ID, Bacteria METHICILLIN RESISTANT  STAPHYLOCOCCUS AUREUS   Final  CULTURE, BLOOD (ROUTINE X 2)     Status: None   Collection Time    03/04/13  7:30 PM      Result Value Range Status   Specimen Description BLOOD ARM LEFT   Final   Special Requests BOTTLES DRAWN AEROBIC AND ANAEROBIC 10CC   Final   Culture  Setup Time 03/05/2013 01:20   Final   Culture     Final   Value:        BLOOD CULTURE RECEIVED NO GROWTH TO DATE CULTURE WILL BE HELD FOR 5 DAYS BEFORE ISSUING A FINAL NEGATIVE REPORT   Report Status PENDING   Incomplete  CULTURE, BLOOD (ROUTINE X 2)     Status: None   Collection Time    03/04/13  7:45 PM      Result Value Range Status   Specimen Description BLOOD HAND LEFT   Final   Special Requests BOTTLES DRAWN AEROBIC AND ANAEROBIC 10CC   Final   Culture  Setup Time 03/05/2013 01:19   Final   Culture     Final   Value: STAPHYLOCOCCUS SPECIES (COAGULASE NEGATIVE)     Note: THE SIGNIFICANCE OF ISOLATING THIS ORGANISM FROM A SINGLE SET OF BLOOD CULTURES WHEN MULTIPLE SETS ARE DRAWN IS UNCERTAIN. PLEASE NOTIFY THE MICROBIOLOGY DEPARTMENT WITHIN ONE WEEK IF SPECIATION AND SENSITIVITIES ARE REQUIRED.     Note: Gram Stain Report Called to,Read Back By and Verified With: TINA ISAACS ON 03/06/2013 AT 12:40A BY WILEJ   Report Status 03/06/2013 FINAL   Final  ANAEROBIC CULTURE     Status: None   Collection Time    03/05/13 11:28 AM      Result Value Range Status   Specimen Description ABSCESS LIVER   Final   Special Requests NONE   Final   Gram Stain PENDING   Incomplete   Culture     Final   Value: NO ANAEROBES ISOLATED; CULTURE IN PROGRESS FOR 5 DAYS   Report Status PENDING   Incomplete  CULTURE, ROUTINE-ABSCESS     Status: None   Collection Time    03/05/13 11:28 AM      Result Value Range Status   Specimen Description ABSCESS LIVER   Final   Special Requests NONE   Final   Gram Stain     Final   Value: FEW WBC PRESENT,BOTH PMN AND MONONUCLEAR     NO SQUAMOUS EPITHELIAL CELLS SEEN     MODERATE GRAM VARIABLE ROD    Culture     Final  Value: MODERATE MICROAEROPHILIC STREPTOCOCCI     Note: Standardized susceptibility testing for this organism is not available.   Report Status 03/08/2013 FINAL   Final  CULTURE, BLOOD (ROUTINE X 2)     Status: None   Collection Time    03/06/13  2:18 PM      Result Value Range Status   Specimen Description BLOOD LEFT ANTECUBITAL   Final   Special Requests BOTTLES DRAWN AEROBIC AND ANAEROBIC 10CC   Final   Culture  Setup Time 03/07/2013 00:16   Final   Culture     Final   Value:        BLOOD CULTURE RECEIVED NO GROWTH TO DATE CULTURE WILL BE HELD FOR 5 DAYS BEFORE ISSUING A FINAL NEGATIVE REPORT   Report Status PENDING   Incomplete    Studies/Results: No results found.   Assessment/Plan: Liver Abscess   Cx- Microaerophillic strep  Anemia of ? source   HIV/Hep C (-).  Amoeba serology pending.  Total days of antibiotics: 7 (unsayn)  Comment- plan for 21 total days of IV anbx then repeat his CT scan to see if tx completed.  Could consider IV ceftriaxone, po flagyl at d/c.  IR to f/u regarding pulling his drain. Output 20cc in last 24hr. Amoeba serology pending Happy to see in f/u   Johny Sax Infectious Diseases [phone] 03/10/2013, 3:11 PM   LOS: 6 days

## 2013-03-10 NOTE — Progress Notes (Signed)
Rehab Admissions Coordinator Note:  Patient was screened by Clois Dupes for appropriateness for an Inpatient Acute Rehab Consult.  At this time, we are recommending Inpatient Rehab consult. I will contact MD for order.  Clois Dupes 03/10/2013, 8:37 AM  I can be reached at [phone].

## 2013-03-10 NOTE — Progress Notes (Signed)
  Subjective: Liver abscess drain placed 5/7 Pt feels so much better   Objective: Vital signs in last 24 hours: Temp:  [97.1 F (36.2 C)-98.9 F (37.2 C)] 97.7 F (36.5 C) (05/12 0824) Pulse Rate:  [78-87] 80 (05/12 0824) Resp:  [16-18] 16 (05/12 0824) BP: (109-140)/(64-82) 124/74 mmHg (05/12 0824) SpO2:  [97 %-99 %] 99 % (05/12 0824) Last BM Date:  (PTA)  Intake/Output from previous day: 05/11 0701 - 05/12 0700 In: 333 [P.O.:120; IV Piggyback:200] Out: 546 [Urine:525; Drains:20; Stool:1] Intake/Output this shift:    PE:  Up in bed Eating well Site of drain clean and dry; sl tender No bleeding Output 20 cc yesterday; 10 cc in JP now Cloudy yellow Cx: +strep Afeb; vss Wbc wnl  Lab Results:   Recent Labs  03/09/13 0500 03/10/13 0600  WBC 8.7 8.4  HGB 9.1* 9.2*  HCT 28.3* 28.6*  PLT 260 242   BMET  Recent Labs  03/09/13 0500 03/10/13 0600  NA 137 137  K 4.0 4.0  CL 103 104  CO2 23 26  GLUCOSE 116* 124*  BUN 11 10  CREATININE 0.89 0.83  CALCIUM 8.7 8.9   PT/INR No results found for this basename: LABPROT, INR,  in the last 72 hours ABG No results found for this basename: PHART, PCO2, PO2, HCO3,  in the last 72 hours  Studies/Results: No results found.  Anti-infectives:   Assessment/Plan: s/p   Liver abscess drain placed 5/7 Doing well Will need Re CT when output less than 10-15 cc/ 24 hrs   LOS: 6 days    Clearance Chenault A 03/10/2013

## 2013-03-11 ENCOUNTER — Encounter (HOSPITAL_COMMUNITY): Payer: Self-pay | Admitting: Gastroenterology

## 2013-03-11 ENCOUNTER — Encounter (HOSPITAL_COMMUNITY)
Admission: EM | Disposition: A | Discharge status: Other Acute Inpatient Hospital | Payer: Self-pay | Source: Home / Self Care | Attending: Internal Medicine

## 2013-03-11 HISTORY — PX: COLONOSCOPY: SHX5424

## 2013-03-11 HISTORY — PX: ESOPHAGOGASTRODUODENOSCOPY: SHX5428

## 2013-03-11 LAB — CULTURE, BLOOD (ROUTINE X 2): Culture: NO GROWTH

## 2013-03-11 SURGERY — COLONOSCOPY
Anesthesia: Moderate Sedation

## 2013-03-11 MED ORDER — SODIUM CHLORIDE 0.9 % IV SOLN
INTRAVENOUS | Status: DC
  Administered 2013-03-11: 500 mL via INTRAVENOUS

## 2013-03-11 MED ORDER — SODIUM CHLORIDE 0.9 % IJ SOLN: 10.0000 mL | INTRAMUSCULAR | Status: DC | PRN

## 2013-03-11 MED ORDER — FENTANYL CITRATE 0.05 MG/ML IJ SOLN
INTRAMUSCULAR | Status: AC
  Filled 2013-03-11: qty 2

## 2013-03-11 MED ORDER — FENTANYL CITRATE 0.05 MG/ML IJ SOLN
INTRAMUSCULAR | Status: DC | PRN
  Administered 2013-03-11 (×2): 25 ug via INTRAVENOUS

## 2013-03-11 MED ORDER — MIDAZOLAM HCL 10 MG/2ML IJ SOLN
INTRAMUSCULAR | Status: DC | PRN
  Administered 2013-03-11 (×2): 1 mg via INTRAVENOUS
  Administered 2013-03-11: 1.5 mg via INTRAVENOUS

## 2013-03-11 MED ORDER — MIDAZOLAM HCL 5 MG/ML IJ SOLN
INTRAMUSCULAR | Status: AC
  Filled 2013-03-11: qty 2

## 2013-03-11 MED ORDER — PEG 3350-KCL-NA BICARB-NACL 420 G PO SOLR: 4000.0000 mL | Freq: Once | ORAL | Status: DC

## 2013-03-11 NOTE — Progress Notes (Signed)
  Subjective: Liver abscess drain placed 5/7 Pt feeling  much better   Objective: Vital signs in last 24 hours: Temp:  [97 F (36.1 C)-98.4 F (36.9 C)] 98.4 F (36.9 C) (05/13 0440) Pulse Rate:  [73-81] 77 (05/13 0440) Resp:  [16-18] 18 (05/13 0440) BP: (114-146)/(69-86) 133/81 mmHg (05/13 0440) SpO2:  [95 %-99 %] 95 % (05/13 0440) Weight:  [182 lb 6.4 oz (82.736 kg)] 182 lb 6.4 oz (82.736 kg) (05/12 2054) Last BM Date:  (PTA)  Intake/Output from previous day: 05/12 0701 - 05/13 0700 In: 216 [IV Piggyback:200] Out: 1202 [Urine:1190; Drains:10; Stool:2] Intake/Output this shift:    PE:  Up in bed Eating well Site of drain clean and dry; sl tender No bleeding Output 10 cc yesterday; scant amount in bulb now. Mostly serous  Lab Results:   Recent Labs  03/09/13 0500 03/10/13 0600  WBC 8.7 8.4  HGB 9.1* 9.2*  HCT 28.3* 28.6*  PLT 260 242   BMET  Recent Labs  03/09/13 0500 03/10/13 0600  NA 137 137  K 4.0 4.0  CL 103 104  CO2 23 26  GLUCOSE 116* 124*  BUN 11 10  CREATININE 0.89 0.83  CALCIUM 8.7 8.9     Assessment/Plan:  Liver abscess drain placed 5/7 Doing well Could repeat CT today or tomorrow to reassess.    LOS: 7 days    Brayton El 03/11/2013

## 2013-03-11 NOTE — PMR Pre-admission (Signed)
PMR Admission Coordinator Pre-Admission Assessment  Patient: Brad Harrell is an 67 y.o., male MRN: [id number] DOB: [birth year] Height: 5\' 4"  (162.6 cm) Weight: 82.555 kg (182 lb)              Insurance Information HMO:     PPO:      PCP:      IPA:      80/20: yes     OTHER: no HMO PRIMARY: Medicare a and b      Policy#: [id number] a      Subscriber: pt Benefits:  Phone #: [email or web address]     Name: 03/11/13 Eff. Date: 01/28/09     Deduct: $1216      Out of Pocket Max: none      Life Max: none CIR: 100%      SNF: 20 full days LBD 06/22/11 Outpatient: 80%     Co-Pay: 20% Home Health: 100%      Co-Pay: none DME: 80%     Co-Pay: 20% Providers: pt choice  SECONDARY: none      Medicaid Application Date:       Case Manager:  Disability Application Date:       Case Worker:   Emergency Contact Information Contact Information   Name Relation Home Work Mobile   Staubs,Pamela Daughter [phone]  [phone]     Current Medical History  Patient Admitting Diagnosis: Deconditioning related to liver abscess, multiple medical. Hx of prior CVAs  History of Present Illness:Brad Harrell is a 67 y.o. male with history of CVA with left hemiparesis, non-ischemic CM, h/o cocaine abuse, chronic foley for retention; admitted on 03/04/13 with complaints of abdominal pain, N/V, right facial weakness and lethargy. MRI with remote right thalamic infarct and right superior frontal infarct and no acute findings. CT abdomen with 7.6 cm liver abscess that was treated with drain placement by IVR. He was started on broad spectrum antibiotics and Dr. Ninetta Lights was consulted for input. 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 He felt that one blood culture positive for staph likely contaminant and UC with MRSA due to colonization. Antibiotics narrowed to ampicillin sulbactam. GI consulted for workup of iron deficiency anemia and due to concerns of occult liver lesion.   EGD and colonoscopy on  5/13 without significant findings. Capsule endoscopy today at 1430 with plans for remain in bed until 7 am on 5/15 or wear vest for photos when up. IR to plan follow up CT abd of pelvis and if improved, then remove abcess drain. PICC placed with plan for total antibiotic therapy for 21 days which began 03/08/13 per ID recommendations.  Total: 7    Past Medical History  Past Medical History  Diagnosis Date  . Hypertension   . Urinary retention   . Hyperlipidemia   . Cardiomyopathy   . H/O cocaine abuse   . Hypothyroidism   . Stroke     "weaker on my left side since" (03/04/2013)  . Liver abscess     measuring 7 x 9 cm/notes 03/04/2013  . Anemia     Iron Defficiency    Family History  family history is not on file.  Prior Rehab/Hospitalizations: CIR 2010 and SNF 2013   Current Medications  Current facility-administered medications:acetaminophen (TYLENOL) tablet 650 mg, 650 mg, Oral, Q6H PRN, Richarda Overlie, MD, 650 mg at 03/05/13 1723;  Ampicillin-Sulbactam (UNASYN) 3 g in sodium chloride 0.9 % 100 mL IVPB, 3 g, Intravenous, Q6H, Cliffton Asters, MD, 3 g at 03/12/13 1440;  carvedilol (COREG) tablet 6.25 mg, 6.25 mg, Oral, BID WC, Zannie Cove, MD, 6.25 mg at 03/12/13 0826 docusate sodium (COLACE) capsule 100 mg, 100 mg, Oral, BID, Richarda Overlie, MD, 100 mg at 03/12/13 1044;  enoxaparin (LOVENOX) injection 40 mg, 40 mg, Subcutaneous, Daily, Charna Elizabeth, MD, 40 mg at 03/12/13 1045;  feeding supplement (ENSURE COMPLETE) liquid 237 mL, 237 mL, Oral, BID BM, Haynes Bast, RD, 237 mL at 03/10/13 0924;  iohexol (OMNIPAQUE) 300 MG/ML solution 100 mL, 100 mL, Intravenous, Once PRN, Medication Radiologist, MD levalbuterol (XOPENEX) nebulizer solution 0.63 mg, 0.63 mg, Nebulization, Q6H PRN, Richarda Overlie, MD;  morphine 2 MG/ML injection 1 mg, 1 mg, Intravenous, TID PRN, Richarda Overlie, MD, 1 mg at 03/12/13 1437;  multivitamin with minerals tablet 1 tablet, 1 tablet, Oral, Daily, Haynes Bast, RD,  1 tablet at 03/12/13 1044;  ondansetron (ZOFRAN) injection 4 mg, 4 mg, Intravenous, Q6H PRN, Richarda Overlie, MD polyethylene glycol (MIRALAX / GLYCOLAX) packet 17 g, 17 g, Oral, Daily, Zannie Cove, MD, 17 g at 03/10/13 1610;  saccharomyces boulardii (FLORASTOR) capsule 250 mg, 250 mg, Oral, BID, Zannie Cove, MD, 250 mg at 03/12/13 1044;  senna (SENOKOT) tablet 8.6 mg, 1 tablet, Oral, BID, Richarda Overlie, MD, 8.6 mg at 03/12/13 1044;  sodium chloride 0.9 % injection 10-40 mL, 10-40 mL, Intracatheter, PRN, Zannie Cove, MD sodium chloride 0.9 % injection 3 mL, 3 mL, Intravenous, Q12H, Richarda Overlie, MD, 3 mL at 03/11/13 1013  Patients Current Diet: Clear Liquid until after capsule endoscopy complete . 10 pm 5/14 regular diet.  Precautions / Restrictions Precautions Precautions: Fall Precaution Comments: bulb drain on right side Restrictions Weight Bearing Restrictions: No   Prior Activity Level Household: very sedentary per pt and dtr  Journalist, newspaper / Equipment Home Assistive Devices/Equipment: None Home Adaptive Equipment: Walker - rolling;Bedside commode/3-in-1;Hospital bed;Shower chair with back  Prior Functional Level Prior Function Level of Independence: Independent with assistive device(s);Needs assistance Needs Assistance: Meal Prep;Light Housekeeping Meal Prep: Total Light Housekeeping: Total Able to Take Stairs?: No Driving: No Vocation: Retired  Current Functional Level Cognition  Arousal/Alertness: Awake/alert Overall Cognitive Status: Within Functional Limits for tasks assessed Orientation Level: Oriented to person;Oriented to place;Disoriented to time;Oriented to situation    Extremity Assessment (includes Sensation/Coordination)  RUE ROM/Strength/Tone: Within functional levels  RLE ROM/Strength/Tone: Deficits RLE ROM/Strength/Tone Deficits: ROM appears good and strength is ~ 3+/5 RLE Coordination: Deficits    ADLs  Eating/Feeding: Simulated;Set  up Where Assessed - Eating/Feeding: Bed level Grooming: Performed;Wash/dry hands;Wash/dry face;Supervision/safety;Set up (with Minimal A for standing balance) Where Assessed - Grooming: Unsupported standing Upper Body Bathing: Simulated;Minimal assistance Where Assessed - Upper Body Bathing: Supported sitting Lower Body Bathing: Simulated;Moderate assistance Where Assessed - Lower Body Bathing: Supported sit to stand Upper Body Dressing: Minimal assistance Where Assessed - Upper Body Dressing: Supported sitting Lower Body Dressing: Simulated;Maximal assistance Where Assessed - Lower Body Dressing: Supported sit to Pharmacist, hospital: Mining engineer Method: Surveyor, minerals:  (Bed>door>sit in recliner>bed>sit in recliner) Toileting - Architect and Hygiene: Simulated;Maximal assistance Where Assessed - Engineer, mining and Hygiene: Standing Equipment Used: Gait belt Transfers/Ambulation Related to ADLs: Min guard A sit to stand and stand to sit due to decreased safety with hand placement and impulsiveness; min A for ambulation ADL Comments: Pt was I/Mod I with all BADLs pta. Daughter reports that the most the patient does if no one is in the house with him is get up to his  BSC.    Mobility  Bed Mobility: Supine to Sit;Sitting - Scoot to Edge of Bed Rolling Right: 4: Min assist Right Sidelying to Sit: 4: Min assist Supine to Sit: 4: Min guard;With rails;HOB elevated Sitting - Scoot to Edge of Bed: 4: Min guard Sit to Supine: 4: Min assist;HOB flat;With rail Scooting to [facility]: 4: Min guard;With rail (trendelenberg)    Transfers  Transfers: Sit to Stand;Stand to Sit Sit to Stand: 4: Min guard;With upper extremity assist;From bed Stand to Sit: 4: Min guard;With upper extremity assist;With armrests;To chair/3-in-1    Ambulation / Gait / Stairs / Psychologist, prison and probation services  Ambulation/Gait Ambulation/Gait Assistance:  1: +2 Total assist Ambulation/Gait: Patient Percentage: 70% Ambulation Distance (Feet): 23 Feet (X 2) Assistive device: Rolling walker Ambulation/Gait Assistance Details: Decr standing/upright stability and unsteady gait Gait Pattern: Decreased stride length General Gait Details: did not test as nursing to start blood transfusion    Posture / Balance Static Sitting Balance Static Sitting - Balance Support: Right upper extremity supported;Left upper extremity supported;Feet supported Static Sitting - Level of Assistance:  (min guard A) Static Sitting - Comment/# of Minutes: Pt with tendency to lean to the right in sitting. Asked him to lean more to his left and instead of leaning he would scoot left and forward. Tried to get him to lean over on his left forearm, but he would not--he felt like he was going to fall. Static Standing Balance Static Standing - Balance Support: Bilateral upper extremity supported Static Standing - Level of Assistance: 3: Mod assist Dynamic Standing Balance Dynamic Standing - Balance Support: Right upper extremity supported;During functional activity Dynamic Standing - Level of Assistance: 4: Min assist Dynamic Standing - Comments: Able to stand fro 1 minute with cues for posture    Special needs/care consideration Bowel mgmt:continent Bladder mgmt:foley Capsule endoscopy began 1430 5/14 to end 7 am 03/13/13.    Previous Home Environment Living Arrangements: Children Lives With: Daughter;Son Available Help at Discharge: Family (per daughter someone is almost always in the house with pt) Type of Home: House Home Layout: One level Home Access: Stairs to enter Entrance Stairs-Rails: Left Entrance Stairs-Number of Steps: 2-3 Bathroom Shower/Tub: Tub/shower unit;Curtain Firefighter: Standard Home Care Services: No  Discharge Living Setting Plans for Discharge Living Setting: Patient's home;Other (Comment) Type of Home at Discharge: House Discharge Home  Layout: One level Discharge Home Access: Stairs to enter Entrance Stairs-Rails: Left Entrance Stairs-Number of Steps: 2 to 3 Discharge Bathroom Shower/Tub: Tub/shower unit Discharge Bathroom Toilet: Standard Discharge Bathroom Accessibility: Yes How Accessible: Accessible via walker Do you have any problems obtaining your medications?: No  Social/Family/Support Systems Patient Roles: Parent Contact Information: Tammy Sours, daughter Anticipated Caregiver: daughter and son Anticipated Caregiver's Contact Information: see above Ability/Limitations of Caregiver: daughter works, son does not and is at home Caregiver Availability: 24/7 Discharge Plan Discussed with Primary Caregiver: Yes Is Caregiver In Agreement with Plan?: Yes Does Caregiver/Family have Issues with Lodging/Transportation while Pt is in Rehab?: No    Goals/Additional Needs Patient/Family Goal for Rehab: Mod I to supervision with PT and OT Expected length of stay: ELOS 7 to 10 days Pt/Family Agrees to Admission and willing to participate: Yes Program Orientation Provided & Reviewed with Pt/Caregiver Including Roles  & Responsibilities: Yes   Decrease burden of Care through IP rehab admission: n/a  Possible need for SNF placement upon discharge:not anticipated   Patient Condition: This patient's medical and functional status has changed since the consult dated: 03/10/13 in which the  Rehabilitation Physician determined and documented that the patient's condition is appropriate for intensive rehabilitative care in an inpatient rehabilitation facility. See "History of Present Illness" (above) for medical update. Functional changes are: overall mod assist.. Patient's medical and functional status update has been discussed with the Rehabilitation physician and patient remains appropriate for inpatient rehabilitation. Will admit to inpatient rehab today.  Preadmission Screen Completed By:  Clois Dupes,  03/12/2013 3:26 PM ______________________________________________________________________   Discussed status with Dr. Riley Kill on 03/12/13 at  1526 and received telephone approval for admission today.  Admission Coordinator:  Clois Dupes, time 1191 Date 03/12/13.

## 2013-03-11 NOTE — Op Note (Signed)
[facility] [address] [place], [address]   OPERATIVE PROCEDURE REPORT  PATIENT: Brad Harrell, Brad Harrell  MR#: [id number] BIRTHDATE: 02-23-1946  GENDER: Male ENDOSCOPIST: Jeani Hawking, MD ASSISTANT:   Dwain Sarna, RN [facility] Oletha Blend, technician PROCEDURE DATE: 03/11/2013 PROCEDURE:   Colonoscopy with snare polypectomy ASA CLASS:   Class III INDICATIONS:Iron Deficiency Anemia. MEDICATIONS: Versed 1mg  IV  DESCRIPTION OF PROCEDURE:   After the risks benefits and alternatives of the procedure were thoroughly explained, informed consent was obtained.  A digital rectal exam revealed no abnormalities of the rectum.    The Pentax Adult Colon [id number] endoscope was introduced through the anus  and advanced to the cecum, which was identified by both the appendix and ileocecal valve , No adverse events experienced.    The quality of the prep was good. .  The instrument was then slowly withdrawn as the colon was fully examined.     FINDINGS: A 1 cm hepatic flexure polyp was identified and removed with a hot snare.  No evideence of any masses, inflammation, or vascualr abnormalities.   Retroflexed views revealed internal/external hemorrhoids.     The scope was then withdrawn from the patient and the procedure terminated.  COMPLICATIONS: There were no complications.  IMPRESSION: 1) Polyp.  RECOMMENDATIONS: 1.  Await biopsy results 2.  Repeat colonoscopy in 3 years. 3.  Avoid anticoagulation if possible.   _______________________________ eSigned:  Jeani Hawking, MD 03/11/2013 2:00 PM

## 2013-03-11 NOTE — Progress Notes (Signed)
Peripherally Inserted Central Catheter/Midline Placement  The IV Nurse has discussed with the patient and/or persons authorized to consent for the patient, the purpose of this procedure and the potential benefits and risks involved with this procedure.  The benefits include less needle sticks, lab draws from the catheter and patient may be discharged home with the catheter.  Risks include, but not limited to, infection, bleeding, blood clot (thrombus formation), and puncture of an artery; nerve damage and irregular heat beat.  Alternatives to this procedure were also discussed.  PICC/Midline Placement Documentation        Lisabeth Devoid 03/11/2013, 10:41 AM Consent obtained by Lazarus Gowda, RN

## 2013-03-11 NOTE — H&P (View-Only) (Signed)
Unassigned patient. Subjective: Heme positive stools and anemia Patient is scheduled for an EGD and a colonoscopy tomorrow afternoon. Will prep tonight. Has multiple medica issues including liver abscess, cocaine abuse and cardiomyopathy. He denies any active GI problems like dysphagia or odynophagia. Has mild constipation.  Objective: Vital signs in last 24 hours: Temp:  [97.2 F (36.2 C)-97.8 F (36.6 C)] 97.8 F (36.6 C) (05/12 1437) Pulse Rate:  [80-86] 81 (05/12 1437) Resp:  [16-18] 16 (05/12 1437) BP: (116-146)/(66-86) 146/86 mmHg (05/12 1437) SpO2:  [97 %-99 %] 99 % (05/12 1437) Last BM Date:  (PTA)  Intake/Output from previous day: 05/11 0701 - 05/12 0700 In: 333 [P.O.:120; IV Piggyback:200] Out: 546 [Urine:525; Drains:20; Stool:1] Intake/Output this shift: Total I/O In: 10 [Other:10] Out: 790 [Urine:790]  General appearance: alert, cooperative, appears older than stated age and no distress Resp: clear to auscultation bilaterally Cardio: regular rate and rhythm, S1, S2 normal, no murmur, click, rub or gallop GI: soft, non-tender with hypoactive bowel sounds; no masses,  no organomegaly.  Lab Results:  Recent Labs  03/08/13 0817 03/09/13 0500 03/10/13 0600  WBC 9.2 8.7 8.4  HGB 9.5* 9.1* 9.2*  HCT 29.0* 28.3* 28.6*  PLT 262 260 242   BMET  Recent Labs  03/08/13 0817 03/09/13 0500 03/10/13 0600  NA 139 137 137  K 4.0 4.0 4.0  CL 103 103 104  CO2 27 23 26   GLUCOSE 126* 116* 124*  BUN 15 11 10   CREATININE 1.14 0.89 0.83  CALCIUM 8.9 8.7 8.9   Medications: I have reviewed the patient's current medications.  Assessment/Plan: 1) Iron deficiency anemia with heme positive stools: EGD and a colonoscopy planned for tomorrow. 2) Constipation on Senokot and Miralax. 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 4) Cardiomyopathy. 5) History of cocaine abuse. 6) S/P stroke.  LOS: 6 days   Heela Heishman 03/10/2013, 6:03 PM

## 2013-03-11 NOTE — Progress Notes (Addendum)
[facility] PROGRESS NOTE  Brad Harrell FAO:[id number] DOB: [birth year] DOA: 03/04/2013 PCP: Dorrene German, MD Brief narrative:  67 year old male with a history of cardiomyopathy, hypertension, cocaine use, CVA with residual L leg weakness presented to ER  with a one-week history of nausea vomiting abdominal pain, constipation and lethargy. CT scan of the abdomen and pelvis showed multiloculated cystic liver lesion possible abscess of measuring 7.9 cm. Subsequently was seen by IR and had a drain placed, on IV abx per ID. Metabolic encephalopathy has resolved, also noted to have Iron deficiency anemia, seen by [facility] for Dr.Hung, scheduled for EGD/colonoscopy today. Also plan for CIR at DC   Assessment/Plan: 1. Liver abscess/cyst: -IR s/p aspiration and drain 5/7, Cultures-Gm stain with gram variable rod and Microaerophilic strep -Blood Cx with1/2 Coag negative staph: contaminant -Started Zosyn per Dr.Hatcher 5/8, then changed to Unasyn per Dr.Campbell 5/10 -Per ID, at discharge change to IV Ceftriaxone and Po flagyl  for 21days -Place PICC -Source unclear, given severe Iron defi anemia and constipation/liver abscess, seen by  GI Dr.Perry  covering for Dr.Hung -EGD/colonoscopy today per dr.Hung -D/w IR, FU CT Abd pelvis tomorrow if improved, then remove drain  2. Metabolic encephalopathy: - due to above -improved -MRI negative for CVA  3. H/o CVA -with residual hemiparesis -continue ASA/Plavix/statin  4. Anemia:Iron deficiency,  -based on anemia panel -no overt blood loss, transfused 2units PRBC 5/9, repeat Hb improved -EGD/colonoscopy per Dr.Hung today  5. Constipation: On senokot/miralax  Ambulate, PT /OT  DVT proph: lovenox  Code Status: full  Family Communication: called and updated daughter 5/11 Disposition Plan: CIR soon  Consultants: IR ID  Antibiotics:  IV zosyn 5/8-5/10  IV Unasyn 5/10  HPI/Subjective: Feels ok, multiple Bms today  after prep  Objective: Filed Vitals:   03/10/13 1437 03/10/13 1836 03/10/13 2054 03/11/13 0440  BP: 146/86 120/69 114/75 133/81  Pulse: 81 77 73 77  Temp: 97.8 F (36.6 C) 97 F (36.1 C) 98 F (36.7 C) 98.4 F (36.9 C)  TempSrc: Oral Oral Oral Oral  Resp: 16 16 18 18   Height:      Weight:   82.736 kg (182 lb 6.4 oz)   SpO2: 99% 98% 98% 95%    Intake/Output Summary (Last 24 hours) at 03/11/13 0842 Last data filed at 03/11/13 0649  Gross per 24 hour  Intake    216 ml  Output   1202 ml  Net   -986 ml   Filed Weights   03/07/13 2202 03/08/13 2120 03/10/13 2054  Weight: 82.6 kg (182 lb 1.6 oz) 82.1 kg (181 lb) 82.736 kg (182 lb 6.4 oz)    Exam:   General:  AAO to self and place  Cardiovascular: S1S2/RRR  Respiratory: CTAB  Abdomen: soft, mild diffuse tenderness , drain noted slightly distended, BS present  Ext: mild residual L leg weakness   Data Reviewed: Basic Metabolic Panel:  Recent Labs Lab 03/04/13 1255 03/04/13 1945  03/06/13 0435 03/07/13 0647 03/08/13 0817 03/09/13 0500 03/10/13 0600  NA 139  --   < > 138 138 139 137 137  K 4.5  --   < > 4.6 4.0 4.0 4.0 4.0  CL 104  --   < > 102 102 103 103 104  CO2  --   --   < > 22 25 27 23 26   GLUCOSE 144*  --   < > 131* 107* 126* 116* 124*  BUN 22  --   < > 17 18  15 11 10   CREATININE 1.20  --   < > 1.19 1.17 1.14 0.89 0.83  CALCIUM  --   --   < > 8.8 9.3 8.9 8.7 8.9  MG  --  2.5  --   --   --   --   --   --   PHOS  --  4.1  --   --   --   --   --   --   < > = values in this interval not displayed. Liver Function Tests:  Recent Labs Lab 03/04/13 1208 03/05/13 0801  AST 34 24  ALT 23 19  ALKPHOS 115 103  BILITOT 0.7 0.8  PROT 9.1* 7.7  ALBUMIN 2.7* 2.2*   No results found for this basename: LIPASE, AMYLASE,  in the last 168 hours  Recent Labs Lab 03/04/13 1920  AMMONIA 23   CBC:  Recent Labs Lab 03/04/13 1208  03/06/13 0435 03/07/13 0647 03/08/13 0817 03/09/13 0500 03/10/13 0600   WBC 13.8*  < > 11.5* 11.2* 9.2 8.7 8.4  NEUTROABS 10.5*  --   --   --   --   --   --   HGB 8.0*  < > 7.3* 6.5* 9.5* 9.1* 9.2*  HCT 25.0*  < > 22.5* 20.5* 29.0* 28.3* 28.6*  MCV 79.6  < > 78.4 79.8 81.2 81.3 82.4  PLT 323  < > 262 262 262 260 242  < > = values in this interval not displayed. Cardiac Enzymes:  Recent Labs Lab 03/04/13 1937 03/05/13 0054  TROPONINI <0.30 <0.30   BNP (last 3 results) No results found for this basename: PROBNP,  in the last 8760 hours CBG:  Recent Labs Lab 03/08/13 1255 03/08/13 1839 03/09/13 0002 03/09/13 0732 03/09/13 1517  GLUCAP 134* 139* 148* 105* 125*    Recent Results (from the past 240 hour(s))  URINE CULTURE     Status: None   Collection Time    03/04/13  2:15 PM      Result Value Range Status   Specimen Description URINE, CLEAN CATCH   Final   Special Requests NONE   Final   Culture  Setup Time 03/04/2013 15:56   Final   Colony Count 10,000 COLONIES/ML   Final   Culture     Final   Value: METHICILLIN RESISTANT STAPHYLOCOCCUS AUREUS     Note: RIFAMPIN AND GENTAMICIN SHOULD NOT BE USED AS SINGLE DRUGS FOR TREATMENT OF STAPH INFECTIONS. CRITICAL RESULT CALLED TO, READ BACK BY AND VERIFIED WITH: DENISE BEASON @ 0142 ON 03/07/2013   Report Status 03/07/2013 FINAL   Final   Organism ID, Bacteria METHICILLIN RESISTANT STAPHYLOCOCCUS AUREUS   Final  CULTURE, BLOOD (ROUTINE X 2)     Status: None   Collection Time    03/04/13  7:30 PM      Result Value Range Status   Specimen Description BLOOD ARM LEFT   Final   Special Requests BOTTLES DRAWN AEROBIC AND ANAEROBIC 10CC   Final   Culture  Setup Time 03/05/2013 01:20   Final   Culture     Final   Value:        BLOOD CULTURE RECEIVED NO GROWTH TO DATE CULTURE WILL BE HELD FOR 5 DAYS BEFORE ISSUING A FINAL NEGATIVE REPORT   Report Status PENDING   Incomplete  CULTURE, BLOOD (ROUTINE X 2)     Status: None   Collection Time    03/04/13  7:45 PM  Result Value Range Status   Specimen  Description BLOOD HAND LEFT   Final   Special Requests BOTTLES DRAWN AEROBIC AND ANAEROBIC 10CC   Final   Culture  Setup Time 03/05/2013 01:19   Final   Culture     Final   Value: STAPHYLOCOCCUS SPECIES (COAGULASE NEGATIVE)     Note: THE SIGNIFICANCE OF ISOLATING THIS ORGANISM FROM A SINGLE SET OF BLOOD CULTURES WHEN MULTIPLE SETS ARE DRAWN IS UNCERTAIN. PLEASE NOTIFY THE MICROBIOLOGY DEPARTMENT WITHIN ONE WEEK IF SPECIATION AND SENSITIVITIES ARE REQUIRED.     Note: Gram Stain Report Called to,Read Back By and Verified With: TINA ISAACS ON 03/06/2013 AT 12:40A BY WILEJ   Report Status 03/06/2013 FINAL   Final  ANAEROBIC CULTURE     Status: None   Collection Time    03/05/13 11:28 AM      Result Value Range Status   Specimen Description ABSCESS LIVER   Final   Special Requests NONE   Final   Gram Stain PENDING   Incomplete   Culture     Final   Value: NO ANAEROBES ISOLATED; CULTURE IN PROGRESS FOR 5 DAYS   Report Status PENDING   Incomplete  CULTURE, ROUTINE-ABSCESS     Status: None   Collection Time    03/05/13 11:28 AM      Result Value Range Status   Specimen Description ABSCESS LIVER   Final   Special Requests NONE   Final   Gram Stain     Final   Value: FEW WBC PRESENT,BOTH PMN AND MONONUCLEAR     NO SQUAMOUS EPITHELIAL CELLS SEEN     MODERATE GRAM VARIABLE ROD   Culture     Final   Value: MODERATE MICROAEROPHILIC STREPTOCOCCI     Note: Standardized susceptibility testing for this organism is not available.   Report Status 03/08/2013 FINAL   Final  CULTURE, BLOOD (ROUTINE X 2)     Status: None   Collection Time    03/06/13  2:18 PM      Result Value Range Status   Specimen Description BLOOD LEFT ANTECUBITAL   Final   Special Requests BOTTLES DRAWN AEROBIC AND ANAEROBIC 10CC   Final   Culture  Setup Time 03/07/2013 00:16   Final   Culture     Final   Value:        BLOOD CULTURE RECEIVED NO GROWTH TO DATE CULTURE WILL BE HELD FOR 5 DAYS BEFORE ISSUING A FINAL NEGATIVE REPORT    Report Status PENDING   Incomplete     Studies: No results found.  Scheduled Meds: . ampicillin-sulbactam (UNASYN) IV  3 g Intravenous Q6H  . carvedilol  6.25 mg Oral BID WC  . docusate sodium  100 mg Oral BID  . enoxaparin (LOVENOX) injection  40 mg Subcutaneous Daily  . feeding supplement  237 mL Oral BID BM  . multivitamin with minerals  1 tablet Oral Daily  . polyethylene glycol  17 g Oral Daily  . saccharomyces boulardii  250 mg Oral BID  . senna  1 tablet Oral BID  . sodium chloride  3 mL Intravenous Q12H   Continuous Infusions: . sodium chloride      Active Problems:   Cardiomyopathy   H/O cocaine abuse   Stroke   Liver abscess   Encephalopathy, metabolic   Anemia   Bacteremia   Anemia, iron deficiency    Time spent:    [facility]  [facility] Pager [phone]. If 7PM-7AM,  please contact night-coverage at [email or web address], password [facility] 03/11/2013, 8:42 AM  LOS: 7 days

## 2013-03-11 NOTE — Interval H&P Note (Signed)
History and Physical Interval Note:  03/11/2013 12:38 PM  Brad Harrell  has presented today for surgery, with the diagnosis of Iron Deficiency Anemia  The various methods of treatment have been discussed with the patient and family. After consideration of risks, benefits and other options for treatment, the patient has consented to  Procedure(s): COLONOSCOPY (N/A) ESOPHAGOGASTRODUODENOSCOPY (EGD) (N/A) as a surgical intervention .  The patient's history has been reviewed, patient examined, no change in status, stable for surgery.  I have reviewed the patient's chart and labs.  Questions were answered to the patient's satisfaction.     Lielle Vandervort D

## 2013-03-11 NOTE — Op Note (Signed)
[facility] [address] [place], [address]   OPERATIVE PROCEDURE REPORT  PATIENT: Brad Harrell, Brad Harrell  MR#: [id number] BIRTHDATE: 21-Jul-1946  GENDER: Male ENDOSCOPIST: Jeani Hawking, MD ASSISTANT:   Dwain Sarna, RN CGRN and Oletha Blend, technician PROCEDURE DATE: 03/11/2013 PROCEDURE:   EGD, diagnostic ASA CLASS:   Class III INDICATIONS:Anemia. MEDICATIONS: Fentanyl 25 mcg IV and Versed 2 mg IV TOPICAL ANESTHETIC:   none  DESCRIPTION OF PROCEDURE:   After the risks benefits and alternatives of the procedure were thoroughly explained, informed consent was obtained.  The Pentax Gastroscope [id number]  endoscope was introduced through the mouth  and advanced to the second portion of the duodenum Without limitations.      The instrument was slowly withdrawn as the mucosa was fully examined.      FINDINGS: In the gastric antum there was evidence of a few scattered erosions.  No evidence of any active bleeding.  In the proximal portion of D2 a couple of nonbleeding AVMs were identified.  No other abnormalities identifie4d.   Retroflexed views revealed no abnormalities.     The scope was then withdrawn from the patient and the procedure terminated.  COMPLICATIONS: There were no complications. IMPRESSION: 1) Gastric erosions. 2) Nonbleeding proximal duodenal AVMs.  RECOMMENDATIONS: 1) Proceed with the colonoscopy.   _______________________________ eSignedJeani Hawking, MD 03/11/2013 1:09 PM

## 2013-03-12 ENCOUNTER — Inpatient Hospital Stay (HOSPITAL_COMMUNITY): Payer: Medicare Other

## 2013-03-12 ENCOUNTER — Inpatient Hospital Stay (HOSPITAL_COMMUNITY)
Admission: RE | Admit: 2013-03-12 | Discharge: 2013-03-21 | DRG: 945 | Disposition: A | Discharge status: Home Health | Payer: Medicare Other | Source: Intra-hospital | Attending: Physical Medicine & Rehabilitation | Admitting: Physical Medicine & Rehabilitation

## 2013-03-12 ENCOUNTER — Encounter (HOSPITAL_COMMUNITY): Payer: Self-pay | Admitting: Gastroenterology

## 2013-03-12 ENCOUNTER — Encounter (HOSPITAL_COMMUNITY)
Admission: EM | Disposition: A | Discharge status: Other Acute Inpatient Hospital | Payer: Self-pay | Source: Home / Self Care | Attending: Internal Medicine

## 2013-03-12 DIAGNOSIS — F1410 Cocaine abuse, uncomplicated: Secondary | ICD-10-CM

## 2013-03-12 DIAGNOSIS — K31819 Angiodysplasia of stomach and duodenum without bleeding: Secondary | ICD-10-CM

## 2013-03-12 DIAGNOSIS — I69959 Hemiplegia and hemiparesis following unspecified cerebrovascular disease affecting unspecified side: Secondary | ICD-10-CM

## 2013-03-12 DIAGNOSIS — D509 Iron deficiency anemia, unspecified: Secondary | ICD-10-CM

## 2013-03-12 DIAGNOSIS — I428 Other cardiomyopathies: Secondary | ICD-10-CM

## 2013-03-12 DIAGNOSIS — N179 Acute kidney failure, unspecified: Secondary | ICD-10-CM

## 2013-03-12 DIAGNOSIS — D649 Anemia, unspecified: Secondary | ICD-10-CM | POA: Diagnosis present

## 2013-03-12 DIAGNOSIS — R5381 Other malaise: Secondary | ICD-10-CM

## 2013-03-12 DIAGNOSIS — Z79899 Other long term (current) drug therapy: Secondary | ICD-10-CM

## 2013-03-12 DIAGNOSIS — I10 Essential (primary) hypertension: Secondary | ICD-10-CM | POA: Diagnosis present

## 2013-03-12 DIAGNOSIS — Z87891 Personal history of nicotine dependence: Secondary | ICD-10-CM

## 2013-03-12 DIAGNOSIS — E785 Hyperlipidemia, unspecified: Secondary | ICD-10-CM

## 2013-03-12 DIAGNOSIS — E039 Hypothyroidism, unspecified: Secondary | ICD-10-CM

## 2013-03-12 DIAGNOSIS — Z8673 Personal history of transient ischemic attack (TIA), and cerebral infarction without residual deficits: Secondary | ICD-10-CM | POA: Diagnosis present

## 2013-03-12 DIAGNOSIS — R339 Retention of urine, unspecified: Secondary | ICD-10-CM

## 2013-03-12 DIAGNOSIS — I639 Cerebral infarction, unspecified: Secondary | ICD-10-CM | POA: Diagnosis present

## 2013-03-12 DIAGNOSIS — K750 Abscess of liver: Secondary | ICD-10-CM | POA: Diagnosis present

## 2013-03-12 DIAGNOSIS — R7881 Bacteremia: Secondary | ICD-10-CM | POA: Diagnosis present

## 2013-03-12 DIAGNOSIS — K2960 Other gastritis without bleeding: Secondary | ICD-10-CM

## 2013-03-12 DIAGNOSIS — Z5189 Encounter for other specified aftercare: Principal | ICD-10-CM

## 2013-03-12 DIAGNOSIS — H5440 Blindness, one eye, unspecified eye: Secondary | ICD-10-CM

## 2013-03-12 HISTORY — PX: GIVENS CAPSULE STUDY: SHX5432

## 2013-03-12 LAB — CREATININE, SERUM
GFR calc Af Amer: >90 mL/min (ref >90)
GFR calc non Af Amer: 88 mL/min — ABNORMAL LOW (ref >90)

## 2013-03-12 LAB — ANAEROBIC CULTURE

## 2013-03-12 LAB — E. HISTOLYTICA ANTIBODY (AMOEBA AB)

## 2013-03-12 SURGERY — IMAGING PROCEDURE, GI TRACT, INTRALUMINAL, VIA CAPSULE
Anesthesia: LOCAL

## 2013-03-12 MED ORDER — FLEET ENEMA 7-19 GM/118ML RE ENEM: 1.0000 | ENEMA | Freq: Once | RECTAL | Status: AC | PRN

## 2013-03-12 MED ORDER — IOHEXOL 300 MG/ML  SOLN
25.0000 mL | INTRAMUSCULAR | Status: AC
  Administered 2013-03-12 (×2): 25 mL via ORAL

## 2013-03-12 MED ORDER — IOHEXOL 300 MG/ML  SOLN
100.0000 mL | Freq: Once | INTRAMUSCULAR | Status: AC | PRN
  Administered 2013-03-12: 100 mL via INTRAVENOUS

## 2013-03-12 MED ORDER — PROCHLORPERAZINE MALEATE 5 MG PO TABS
5.0000 mg | ORAL_TABLET | Freq: Four times a day (QID) | ORAL | Status: DC | PRN
  Filled 2013-03-12: qty 2

## 2013-03-12 MED ORDER — ENOXAPARIN SODIUM 40 MG/0.4ML ~~LOC~~ SOLN
40.0000 mg | SUBCUTANEOUS | Status: DC
  Administered 2013-03-12 – 2013-03-20 (×9): 40 mg via SUBCUTANEOUS
  Filled 2013-03-12 (×10): qty 0.4

## 2013-03-12 MED ORDER — ACETAMINOPHEN 325 MG PO TABS: 325.0000 mg | ORAL_TABLET | ORAL | Status: DC | PRN

## 2013-03-12 MED ORDER — DIPHENHYDRAMINE HCL 12.5 MG/5ML PO ELIX: 12.5000 mg | ORAL_SOLUTION | Freq: Four times a day (QID) | ORAL | Status: DC | PRN

## 2013-03-12 MED ORDER — ONDANSETRON HCL 4 MG/2ML IJ SOLN: 4.0000 mg | Freq: Four times a day (QID) | INTRAMUSCULAR | Status: DC | PRN

## 2013-03-12 MED ORDER — PROCHLORPERAZINE EDISYLATE 5 MG/ML IJ SOLN
5.0000 mg | Freq: Four times a day (QID) | INTRAMUSCULAR | Status: DC | PRN
  Filled 2013-03-12: qty 2

## 2013-03-12 MED ORDER — SACCHAROMYCES BOULARDII 250 MG PO CAPS
250.0000 mg | ORAL_CAPSULE | Freq: Two times a day (BID) | ORAL | Status: DC
  Administered 2013-03-12 – 2013-03-21 (×18): 250 mg via ORAL
  Filled 2013-03-12 (×21): qty 1

## 2013-03-12 MED ORDER — BISACODYL 10 MG RE SUPP: 10.0000 mg | Freq: Every day | RECTAL | Status: DC | PRN

## 2013-03-12 MED ORDER — DOCUSATE SODIUM 100 MG PO CAPS
100.0000 mg | ORAL_CAPSULE | Freq: Two times a day (BID) | ORAL | Status: DC
  Administered 2013-03-12 – 2013-03-21 (×16): 100 mg via ORAL
  Filled 2013-03-12 (×21): qty 1

## 2013-03-12 MED ORDER — ALUM & MAG HYDROXIDE-SIMETH 200-200-20 MG/5ML PO SUSP: 30.0000 mL | ORAL | Status: DC | PRN

## 2013-03-12 MED ORDER — LEVALBUTEROL HCL 0.63 MG/3ML IN NEBU
0.6300 mg | INHALATION_SOLUTION | Freq: Four times a day (QID) | RESPIRATORY_TRACT | Status: DC | PRN
  Filled 2013-03-12: qty 3

## 2013-03-12 MED ORDER — AMPICILLIN-SULBACTAM SODIUM 3 (2-1) G IJ SOLR
3.0000 g | Freq: Four times a day (QID) | INTRAMUSCULAR | Status: DC
  Administered 2013-03-12 – 2013-03-21 (×34): 3 g via INTRAVENOUS
  Filled 2013-03-12 (×36): qty 3

## 2013-03-12 MED ORDER — PROCHLORPERAZINE 25 MG RE SUPP
12.5000 mg | Freq: Four times a day (QID) | RECTAL | Status: DC | PRN
  Filled 2013-03-12: qty 1

## 2013-03-12 MED ORDER — POLYETHYLENE GLYCOL 3350 17 G PO PACK
17.0000 g | PACK | Freq: Every day | ORAL | Status: DC
  Administered 2013-03-14 – 2013-03-21 (×6): 17 g via ORAL
  Filled 2013-03-12 (×11): qty 1

## 2013-03-12 MED ORDER — GUAIFENESIN-DM 100-10 MG/5ML PO SYRP: 5.0000 mL | ORAL_SOLUTION | Freq: Four times a day (QID) | ORAL | Status: DC | PRN

## 2013-03-12 MED ORDER — CARVEDILOL 6.25 MG PO TABS
6.2500 mg | ORAL_TABLET | Freq: Two times a day (BID) | ORAL | Status: DC
  Administered 2013-03-13 – 2013-03-21 (×17): 6.25 mg via ORAL
  Filled 2013-03-12 (×20): qty 1

## 2013-03-12 MED ORDER — ENSURE COMPLETE PO LIQD: 237.0000 mL | Freq: Two times a day (BID) | ORAL | Status: DC

## 2013-03-12 MED ORDER — SENNA 8.6 MG PO TABS
1.0000 | ORAL_TABLET | Freq: Two times a day (BID) | ORAL | Status: DC
  Administered 2013-03-12 – 2013-03-21 (×15): 8.6 mg via ORAL
  Filled 2013-03-12 (×21): qty 1

## 2013-03-12 MED ORDER — HYDROCODONE-ACETAMINOPHEN 5-325 MG PO TABS
1.0000 | ORAL_TABLET | ORAL | Status: DC | PRN
  Administered 2013-03-16: 1 via ORAL
  Filled 2013-03-12: qty 1

## 2013-03-12 MED ORDER — TRAZODONE HCL 50 MG PO TABS: 25.0000 mg | ORAL_TABLET | Freq: Every evening | ORAL | Status: DC | PRN

## 2013-03-12 MED ORDER — ADULT MULTIVITAMIN W/MINERALS CH
1.0000 | ORAL_TABLET | Freq: Every day | ORAL | Status: DC
  Administered 2013-03-13 – 2013-03-21 (×9): 1 via ORAL
  Filled 2013-03-12 (×11): qty 1

## 2013-03-12 SURGICAL SUPPLY — 1 items: TOWEL COTTON PACK 4EA (MISCELLANEOUS) ×4 IMPLANT

## 2013-03-12 NOTE — Discharge Summary (Signed)
Physician Discharge Summary  Brad Harrell NFA:[id number] DOB: May 26, 1946 DOA: 03/04/2013  PCP: Dorrene German, MD  Admit date: 03/04/2013 Discharge date: 03/12/2013  Time spent: Greater than 30  minutes  Recommendations for Outpatient Follow-up:  Follow up with OP provider once released from CIR.   Discharge Diagnoses:  Active Problems:   Cardiomyopathy   H/O cocaine abuse   Stroke   Liver abscess   Encephalopathy, metabolic   Anemia   Bacteremia   Anemia, iron deficiency   Discharge Condition: Stable and improved  Filed Weights   03/08/13 2120 03/10/13 2054 03/12/13 1412  Weight: 82.1 kg (181 lb) 82.736 kg (182 lb 6.4 oz) 82.555 kg (182 lb)    History of present illness:  67 year old male with a history of cardiomyopathy, hypertension, cocaine use, CVA with residual L leg weakness presented to ER with a one-week history of nausea vomiting abdominal pain, constipation and lethargy. CT scan of the abdomen and pelvis showed multiloculated cystic liver lesion possible abscess of measuring 7.9 cm.  Subsequently was seen by IR and had a drain placed, on IV abx per ID. Metabolic encephalopathy has resolved, also noted to have Iron deficiency anemia, seen by [facility] for Dr.Hung,  EGD/colonoscopy negative, for capsule endoscopy today. Also plan for CIR at [facility] Course:  1. Liver abscess/cyst: -IR s/p aspiration and drain 5/7, Cultures-Gm stain with gram variable rod and Microaerophilic strep  -Blood Cx with1/2 Coag negative staph: contaminant  -Started Zosyn per Dr.Hatcher 5/8, then changed to Unasyn per Dr.Campbell 5/10  -Per ID, at discharge change to IV Ceftriaxone and Po flagyl for 21days  -Place PICC  -Source unclear, given severe Iron defi anemia and constipation/liver abscess, seen by GI Dr.Perry [place] covering for Dr.Hung  -EGD/colonoscopy without significant findings. Plan for a capsule endoscopy today.  -D/w IR, FU CT Abd pelvis tomorrow if improved, then  remove drain.   2. Metabolic encephalopathy:  - due to above  -Resolved  -MRI negative for CVA   3. H/o CVA  -with residual hemiparesis  -continue ASA/Plavix/statin   4. Anemia:Iron deficiency,  -based on anemia panel  -no overt blood loss, transfused 2units PRBC 5/9, repeat Hb improved  -EGD/colonoscopy within normal limits, capsule endoscopy today.   5. Constipation:  On senokot/miralax   To CIR .  Discharge Instructions     Medication List    TAKE these medications       aspirin 81 MG chewable tablet  Chew 81 mg by mouth daily.     carvedilol 3.125 MG tablet  Commonly known as:  COREG  Take 3.125 mg by mouth 2 (two) times daily with a meal.     clopidogrel 75 MG tablet  Commonly known as:  PLAVIX  Take 75 mg by mouth daily.     doxazosin 4 MG tablet  Commonly known as:  CARDURA  Take 4 mg by mouth at bedtime.     levothyroxine 100 MCG tablet  Commonly known as:  SYNTHROID, LEVOTHROID  Take 100 mcg by mouth daily.     simvastatin 40 MG tablet  Commonly known as:  ZOCOR  Take 40 mg by mouth every evening.       No Known Allergies    The results of significant diagnostics from this hospitalization (including imaging, microbiology, ancillary and laboratory) are listed below for reference.    Significant Diagnostic Studies: Ct Head Wo Contrast  03/04/2013   *RADIOLOGY REPORT*  Clinical Data: Facial droop  CT HEAD WITHOUT  CONTRAST  Technique:  Contiguous axial images were obtained from the base of the skull through the vertex without contrast.  Comparison: 05/01/2011  Findings: No skull fracture is noted.  Paranasal sinuses and mastoid air cells are unremarkable.  Stable cerebral atrophy. Small lacunar infarct is noted in the right thalamus. Periventricular and subcortical white matter decreased attenuation probable due to chronic small vessel ischemic changes.  No acute infarction.  No mass lesion is noted on this unenhanced scan.  IMPRESSION: No acute  intracranial abnormality.  Stable atrophy and chronic white matter disease.   Original Report Authenticated By: Natasha Mead, M.D.   Mr [name] Wo Contrast  03/04/2013   *RADIOLOGY REPORT*  Clinical Data: Evaluate for abscess.  History high blood pressure and cocaine abuse.  Right facial droop.  MRI HEAD WITHOUT AND WITH CONTRAST  Technique:  Multiplanar, multiecho pulse sequences of the brain and surrounding structures were obtained according to standard protocol without and with intravenous contrast  Contrast: 19mL MULTIHANCE GADOBENATE DIMEGLUMINE 529 MG/ML IV SOLN  Comparison: 03/04/2013 the head CT.  05/01/2011 brain MR.  Findings: No acute infarct.  No intracranial mass or findings of intracranial abscess.  Remote infarct right thalamus.  Remote infarct posterior superior right frontal lobe.  Prominent small vessel disease type changes.  Atrophy without hydrocephalus.  No intracranial hemorrhage.  Heterogeneous bone marrow may reflect changes of underlying anemia.  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  Major intracranial vascular structures are patent.  Paranasal sinus mucosal thickening.  IMPRESSION: No acute infarct or findings of intracranial abscess.  Please see above.   Original Report Authenticated By: Lacy Duverney, M.D.   Ct Abdomen Pelvis W Contrast  03/04/2013   *RADIOLOGY REPORT*  Clinical Data: Abdominal pain, lethargy.  CT ABDOMEN AND PELVIS WITH CONTRAST  Technique:  Multidetector CT imaging of the abdomen and pelvis was performed following the standard protocol during bolus administration of intravenous contrast.  Contrast: OMNIPAQUE IOHEXOL 300 MG/ML  SOLN  Comparison: None.  Findings: Linear scarring or atelectasis posteriorly in the visualized left lower lung.  There is a complex multiloculated 7.9 cm lesion in the left hepatic lobe.  There is some mild enhancement around the central fluid attenuation components and some edematous changes in the liver  parenchyma more peripherally.  No other liver lesion is evident.  Unremarkable spleen, adrenal glands, kidneys, pancreas.  Aortoiliac atheromatous calcifications and plaque without aneurysm or stenosis.  Portal vein remains patent.  The stomach, small bowel, and colon are nondilated.  Foley catheter decompresses the urinary bladder.  Prostatic enlargement with central coarse calcifications. No ascites.  No free air.  No adenopathy evident.  No hydronephrosis.  Spondylitic changes in the thoracolumbar spine most marked L3-4 and L5-S1.  IMPRESSION:  1.  Multiloculated cystic liver lesion, favor abscess over neoplasm.  This would be approachable for percutaneous drainage.   Original Report Authenticated By: D. Andria Rhein, MD   Dg Abd Acute W/chest  03/04/2013   *RADIOLOGY REPORT*  Clinical Data: Acute abdominal pain, facial droop  ACUTE ABDOMEN SERIES (ABDOMEN 2 VIEW & CHEST 1 VIEW)  Comparison: 08/15/2012  Findings: Cardiomediastinal silhouette is stable.  No acute infiltrate or pulmonary edema.  Mild gaseous distended small bowel loops mid abdomen suspicious for ileus.  No free abdominal air.  IMPRESSION: No acute disease within chest.  Mild gaseous distended small bowel loops mid abdomen suspicious for ileus.  No free abdominal air.   Original Report Authenticated By: Natasha Mead, M.D.  Ir Image Guided Drainage Percut Cath  Peritoneal Retroperit  03/05/2013   *RADIOLOGY REPORT*  Clinical Data: Left hepatic abscess, polysubstance abuse  ULTRASOUND LEFT HEPATIC ABSCESS DRAIN INSERTION  Date:  03/05/2013 10:40:00  Radiologist:  M. Ruel Favors, M.D.  Medications:  2 mg Versed, 50 mcg Fentanyl  Guidance:  Ultrasound  Fluoroscopy time:  None.  Sedation time:  11 minutes  Contrast volume:  None.  Complications:  No immediate  PROCEDURE/FINDINGS:  Informed consent was obtained from the patient following explanation of the procedure, risks, benefits and alternatives. The patient understands, agrees and consents for the  procedure. All questions were addressed.  A time out was performed.  Maximal barrier sterile technique utilized including caps, mask, sterile gowns, sterile gloves, large sterile drape, hand hygiene, and betadine  Previous imaging reviewed.  Preliminary ultrasound performed of the left hepatic lobe from a sub xyphoid approach.  The heterogeneous fluid collection in the left hepatic lobe lateral segment was localized.  Under sterile conditions and local anesthesia, an 18 gauge 15 cm access needle was advanced percutaneously into the left hepatic abscess.  Syringe aspiration yielded exudative fluid. Sample sent for Gram stain and culture.  Guide wire inserted followed by tract dilatation to insert 10-French drain.  Syringe aspiration yielded 30 ml additional exudative fluid.  Catheter secured with a Prolene suture and connected to external suction bulb.  Sterile dressing applied.  No immediate complication.  The patient tolerated the procedure well.  IMPRESSION: Successful ultrasound left hepatic abscess drain insertion   Original Report Authenticated By: Judie Petit. Miles Costain, M.D.    Microbiology: Recent Results (from the past 240 hour(s))  URINE CULTURE     Status: None   Collection Time    03/04/13  2:15 PM      Result Value Range Status   Specimen Description URINE, CLEAN CATCH   Final   Special Requests NONE   Final   Culture  Setup Time 03/04/2013 15:56   Final   Colony Count 10,000 COLONIES/ML   Final   Culture     Final   Value: METHICILLIN RESISTANT STAPHYLOCOCCUS AUREUS     Note: RIFAMPIN AND GENTAMICIN SHOULD NOT BE USED AS SINGLE DRUGS FOR TREATMENT OF STAPH INFECTIONS. CRITICAL RESULT CALLED TO, READ BACK BY AND VERIFIED WITH: DENISE BEASON @ 0142 ON 03/07/2013   Report Status 03/07/2013 FINAL   Final   Organism ID, Bacteria METHICILLIN RESISTANT STAPHYLOCOCCUS AUREUS   Final  CULTURE, BLOOD (ROUTINE X 2)     Status: None   Collection Time    03/04/13  7:30 PM      Result Value Range Status    Specimen Description BLOOD ARM LEFT   Final   Special Requests BOTTLES DRAWN AEROBIC AND ANAEROBIC 10CC   Final   Culture  Setup Time 03/05/2013 01:20   Final   Culture NO GROWTH 5 DAYS   Final   Report Status 03/11/2013 FINAL   Final  CULTURE, BLOOD (ROUTINE X 2)     Status: None   Collection Time    03/04/13  7:45 PM      Result Value Range Status   Specimen Description BLOOD HAND LEFT   Final   Special Requests BOTTLES DRAWN AEROBIC AND ANAEROBIC 10CC   Final   Culture  Setup Time 03/05/2013 01:19   Final   Culture     Final   Value: STAPHYLOCOCCUS SPECIES (COAGULASE NEGATIVE)     Note: THE SIGNIFICANCE OF ISOLATING THIS ORGANISM FROM A SINGLE  SET OF BLOOD CULTURES WHEN MULTIPLE SETS ARE DRAWN IS UNCERTAIN. PLEASE NOTIFY THE MICROBIOLOGY DEPARTMENT WITHIN ONE WEEK IF SPECIATION AND SENSITIVITIES ARE REQUIRED.     Note: Gram Stain Report Called to,Read Back By and Verified With: TINA ISAACS ON 03/06/2013 AT 12:40A BY WILEJ   Report Status 03/06/2013 FINAL   Final  ANAEROBIC CULTURE     Status: None   Collection Time    03/05/13 11:28 AM      Result Value Range Status   Specimen Description ABSCESS LIVER   Final   Special Requests NONE   Final   Gram Stain     Final   Value: FEW WBC PRESENT,BOTH PMN AND MONONUCLEAR     NO SQUAMOUS EPITHELIAL CELLS SEEN     MODERATE GRAM VARIABLE ROD   Culture     Final   Value: BACTEROIDES CACCAE     Note: BETA LACTAMASE POSITIVE   Report Status 03/12/2013 FINAL   Final  CULTURE, ROUTINE-ABSCESS     Status: None   Collection Time    03/05/13 11:28 AM      Result Value Range Status   Specimen Description ABSCESS LIVER   Final   Special Requests NONE   Final   Gram Stain     Final   Value: FEW WBC PRESENT,BOTH PMN AND MONONUCLEAR     NO SQUAMOUS EPITHELIAL CELLS SEEN     MODERATE GRAM VARIABLE ROD   Culture     Final   Value: MODERATE MICROAEROPHILIC STREPTOCOCCI     Note: Standardized susceptibility testing for this organism is not available.    Report Status 03/08/2013 FINAL   Final  CULTURE, BLOOD (ROUTINE X 2)     Status: None   Collection Time    03/06/13  2:18 PM      Result Value Range Status   Specimen Description BLOOD LEFT ANTECUBITAL   Final   Special Requests BOTTLES DRAWN AEROBIC AND ANAEROBIC 10CC   Final   Culture  Setup Time 03/07/2013 00:16   Final   Culture     Final   Value:        BLOOD CULTURE RECEIVED NO GROWTH TO DATE CULTURE WILL BE HELD FOR 5 DAYS BEFORE ISSUING A FINAL NEGATIVE REPORT   Report Status PENDING   Incomplete     Labs: Basic Metabolic Panel:  Recent Labs Lab 03/06/13 0435 03/07/13 0647 03/08/13 0817 03/09/13 0500 03/10/13 0600  NA 138 138 139 137 137  K 4.6 4.0 4.0 4.0 4.0  CL 102 102 103 103 104  CO2 22 25 27 23 26   GLUCOSE 131* 107* 126* 116* 124*  BUN 17 18 15 11 10   CREATININE 1.19 1.17 1.14 0.89 0.83  CALCIUM 8.8 9.3 8.9 8.7 8.9   Liver Function Tests: No results found for this basename: AST, ALT, ALKPHOS, BILITOT, PROT, ALBUMIN,  in the last 168 hours No results found for this basename: LIPASE, AMYLASE,  in the last 168 hours No results found for this basename: AMMONIA,  in the last 168 hours CBC:  Recent Labs Lab 03/06/13 0435 03/07/13 0647 03/08/13 0817 03/09/13 0500 03/10/13 0600  WBC 11.5* 11.2* 9.2 8.7 8.4  HGB 7.3* 6.5* 9.5* 9.1* 9.2*  HCT 22.5* 20.5* 29.0* 28.3* 28.6*  MCV 78.4 79.8 81.2 81.3 82.4  PLT 262 262 262 260 242   Cardiac Enzymes: No results found for this basename: CKTOTAL, CKMB, CKMBINDEX, TROPONINI,  in the last 168 hours BNP: BNP (last 3 results)  No results found for this basename: PROBNP,  in the last 8760 hours CBG:  Recent Labs Lab 03/08/13 1255 03/08/13 1839 03/09/13 0002 03/09/13 0732 03/09/13 1517  GLUCAP 134* 139* 148* 105* 125*       Signed:  HERNANDEZ ACOSTA,ESTELA  [facility] Pager: [phone] 03/12/2013, 5:07 PM

## 2013-03-12 NOTE — Interval H&P Note (Signed)
Brad Harrell was admitted today to Inpatient Rehabilitation with the diagnosis of deconditioning after hepatic abscess.  The patient's history has been reviewed, patient examined, and there is no change in status.  Patient continues to be appropriate for intensive inpatient rehabilitation.  I have reviewed the patient's chart and labs.  Questions were answered to the patient's satisfaction.  SWARTZ,ZACHARY T 03/12/2013, 10:47 PM

## 2013-03-12 NOTE — Progress Notes (Signed)
Physical Therapy Treatment Patient Details Name: RITHWIK SCHMIEG MRN: [id number] DOB: 08-Jan-1946 Today's Date: 03/12/2013 Time: 1720-1757 PT Time Calculation (min): 37 min  PT Assessment / Plan / Recommendation Comments on Treatment Session  Continues to make good progress with mobiliy    Follow Up Recommendations  CIR     Does the patient have the potential to tolerate intense rehabilitation     Barriers to Discharge        Equipment Recommendations  None recommended by PT    Recommendations for Other Services    Frequency Min 3X/week   Plan Discharge plan remains appropriate    Precautions / Restrictions Precautions Precautions: Fall Precaution Comments: bulb drain on right side Restrictions Weight Bearing Restrictions: No   Pertinent Vitals/Pain No c/o pain    Mobility  Bed Mobility Bed Mobility: Supine to Sit;Sitting - Scoot to Edge of Bed Right Sidelying to Sit: Not tested (comment) Supine to Sit: 5: Supervision;HOB elevated Sitting - Scoot to Edge of Bed: 4: Min guard Sit to Supine: Not Tested (comment) Transfers Transfers: Sit to Stand;Stand to Sit Sit to Stand: 4: Min assist;From bed;From chair/3-in-1;With upper extremity assist;From elevated surface Stand to Sit: 4: Min assist;To chair/3-in-1;To bed;With upper extremity assist Details for Transfer Assistance: VCs for hand placement; assist to initiate standing secondary to LE weakness.   Ambulation/Gait Ambulation/Gait Assistance: 4: Min assist Ambulation/Gait: Patient Percentage: 80% Ambulation Distance (Feet): 40 Feet Assistive device: Rolling walker Ambulation/Gait Assistance Details: Cues for proper use of RW.  Assist to steady pt initially.   Gait Pattern: Decreased stride length;Trunk flexed;Narrow base of support Stairs: No Wheelchair Mobility Wheelchair Mobility: No    Exercises     PT Diagnosis:    PT Problem List:   PT Treatment Interventions:     PT Goals Acute Rehab PT Goals PT Goal  Formulation: With patient Time For Goal Achievement: 03/21/13 Potential to Achieve Goals: Good Pt will go Supine/Side to Sit: Independently PT Goal: Supine/Side to Sit - Progress: Progressing toward goal Pt will go Sit to Supine/Side: Independently PT Goal: Sit to Supine/Side - Progress: Progressing toward goal Pt will go Sit to Stand: with supervision PT Goal: Sit to Stand - Progress: Progressing toward goal Pt will go Stand to Sit: with supervision PT Goal: Stand to Sit - Progress: Progressing toward goal Pt will Ambulate: 51 - 150 feet;with min assist PT Goal: Ambulate - Progress: Progressing toward goal  Visit Information  Last PT Received On: 03/12/13 Assistance Needed: +1    Subjective Data  Subjective: Pt to go to CIR today.  Patient Stated Goal: to go home   Cognition  Cognition Arousal/Alertness: Awake/alert Behavior During Therapy: WFL for tasks assessed/performed Overall Cognitive Status: Within Functional Limits for tasks assessed    Balance  Balance Balance Assessed: No  End of Session PT - End of Session Equipment Utilized During Treatment: Gait belt Activity Tolerance: Patient tolerated treatment well Patient left: in chair;with chair alarm set;with call bell/phone within reach Nurse Communication: Mobility status   GP     Jull Harral 03/12/2013, 6:08 PM Felicitas Sine L. Chilton Si DPT  pager [phone]    Cell [phone]

## 2013-03-12 NOTE — Progress Notes (Signed)
Report given to Rehab nurse.

## 2013-03-12 NOTE — Plan of Care (Signed)
Overall Plan of Care [facility]) Patient Details Name: Brad Harrell MRN: [id number] DOB: 07-18-1946  Diagnosis:  Deconditioning after liver abscess  Co-morbidities: Prior CVA, polysubstance abuse  Functional Problem List  Patient demonstrates impairments in the following areas: Balance, Bladder, Bowel, Motor, Safety and Vision  Basic ADL's: bathing, dressing and toileting Advanced ADL's: None  Transfers:  bed mobility, bed to chair, toilet, tub/shower, car and furniture Locomotion:  ambulation, wheelchair mobility and stairs  Additional Impairments:  Functional use of upper extremity  Anticipated Outcomes Item Anticipated Outcome  Eating/Swallowing  Independent once off liquid diet  Basic self-care  Intermittent supervision / set up  Tolieting  Mod I  Bowel/Bladder  Manage bowel and bladder with timed toileting  Transfers  Mod I basic, supervision car  Locomotion  Supervision wheelchair and household ambulation,  Min assist stairs  Communication    Cognition    Pain  </=3  Safety/Judgment  No falls with injury  Other     Therapy Plan: PT Intensity: Minimum of 1-2 x/day ,45 to 90 minutes PT Frequency: 5 out of 7 days PT Duration Estimated Length of Stay: 7 - 10 days OT Intensity: Minimum of 1-2 x/day, 45 to 90 minutes OT Frequency: 5 out of 7 days      Team Interventions: Item RN PT OT SLP SW TR Other  Self Care/Advanced ADL Retraining   X      Neuromuscular Re-Education   X      Therapeutic Activities  x X      UE/LE Strength Training/ROM  x X      UE/LE Coordination Activities  x X      Visual/Perceptual Remediation/Compensation         DME/Adaptive Equipment Instruction   X      Therapeutic Exercise  x X      Balance/Vestibular Training  x X      Patient/Family Education  x X      Cognitive Remediation/Compensation   X      Functional Mobility Training  x X      Ambulation/Gait Training  x       Museum/gallery curator  x       Wheelchair  Propulsion/Positioning  x       Energy manager         Bladder Management x        Bowel Management x        Disease Management/Prevention x        Pain Management x        Medication Management x        Skin Care/Wound Management x        Splinting/Orthotics         Discharge Planning x x X      Psychosocial Support x  X                         Team Discharge Planning: Destination: PT-Home ,OT- Home , SLP-  Projected Follow-up: PT-Home health PT, OT-  None, SLP-  Projected Equipment Needs: PT-None recommended by PT, OT- None recommended by OT, SLP-  Patient/family involved in discharge planning: PT- Patient,  OT-Patient, SLP-   MD ELOS: 1-2 weeks Medical Rehab Prognosis:  Good Assessment: 67 year old male admitted with liver  abscess requiring percutaneous drainage. Patient is deconditioned and now requiring CIR level PT OT as well as 24 7 rehabilitation RN and M.D. Treatment team will focus on increasing activity tolerance, endurance, safety. Goals are for supervision modified independent levels    See Team Conference Notes for weekly updates to the plan of care

## 2013-03-12 NOTE — Progress Notes (Signed)
I will plan admit pt to inpt rehab today. Patient is in agreement. I have discussed with attending MD. [phone]

## 2013-03-12 NOTE — Progress Notes (Signed)
Patient has a clear liquid diet ordered and a foley.  Deatra Ina, PA paged to determine if diet and foley should be continued or if patient is able to advance diet.  Advised to keep diet as it is for now and continue foley and to notify the physician in the morning for additional orders.  Will continue to monitor.

## 2013-03-12 NOTE — H&P (Signed)
Physical Medicine and Rehabilitation Admission H&P    Chief Complaint  Patient presents with  .   : HPI: Brad Harrell is a 67 y.o. male with history of CVA with left hemiparesis, non-ischemic CM, h/o cocaine abuse, chronic foley for retention; admitted on 03/04/13 with complaints of abdominal pain, N/V, right facial weakness and lethargy. MRI with remote right thalamic infarct and right superior frontal infarct and no acute findings. CT abdomen with 7.6 cm liver abscess that was treated with drain placement by IVR. He was started on broad spectrum antibiotics and Dr. Ninetta Lights was consulted for input. 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 He felt that one blood culture positive for staph likely contaminant and UC with MRSA due to colonization. Antibiotics narrowed to ampicillin sulbactam. GI consulted for workup of iron deficiency anemia and due to concerns of occult liver lesion. EGD done revealing gastric erosions and non-bleeding upper duodenal AVMs. Colonoscopy done and polyp biopsied with recommendations to avoid anticoagulation by Dr. Elnoria Howard. No GI cause of liver abscess and capsule endoscopy ordered for work up of iron deficiency anemia.   Follow up CT abdomen to be repeated prior to removing drain. ID recommends 21 days of IV antibiotic--could consider change to IV ceftriaxone and po flagyl at discharge then repeat  CT to see if Tx completed . [place] serology pending. Therapies initiated and patient noted to have slow movements with decreased posture. Therapy team are commending CIR and patient admitted for follow up therapies.     Review of Systems  HENT: Negative for hearing loss and neck pain.   Eyes: Negative for discharge.       Blind right eye  Respiratory: Negative for cough and shortness of breath.   Cardiovascular: Negative for chest pain and palpitations.  Gastrointestinal: Positive for nausea and abdominal pain. Negative for constipation.   Genitourinary: Negative for urgency and frequency.  Musculoskeletal: Negative for myalgias.  Neurological: Negative for headaches.   Past Medical History  Diagnosis Date  . Hypertension   . Urinary retention   . Hyperlipidemia   . Cardiomyopathy   . H/O cocaine abuse   . Hypothyroidism   . Stroke     "weaker on my left side since" (03/04/2013)  . Liver abscess     measuring 7 x 9 cm/notes 03/04/2013  . Anemia     Iron Defficiency   Past Surgical History  Procedure Laterality Date  . Appendectomy    . Inguinal hernia repair Right   . Tibia fracture surgery Left   . Colonoscopy N/A 03/11/2013    Procedure: COLONOSCOPY;  Surgeon: Theda Belfast, MD;  Location: [facility] ENDOSCOPY;  Service: Endoscopy;  Laterality: N/A;  . Esophagogastroduodenoscopy N/A 03/11/2013    Procedure: ESOPHAGOGASTRODUODENOSCOPY (EGD);  Surgeon: Theda Belfast, MD;  Location: [facility] ENDOSCOPY;  Service: Endoscopy;  Laterality: N/A;   History reviewed. No pertinent family history. Social History:  reports that he has quit smoking. His smoking use included Cigarettes. He has a 51 pack-year smoking history. He does not have any smokeless tobacco history on file. He reports that  drinks alcohol. He reports that he uses illicit drugs (Cocaine). Allergies: No Known Allergies Medications Prior to Admission  Medication Sig Dispense Refill  . aspirin 81 MG chewable tablet Chew 81 mg by mouth daily.      . carvedilol (COREG) 3.125 MG tablet Take 3.125 mg by mouth 2 (two) times daily with a meal.      . clopidogrel (PLAVIX) 75 MG  tablet Take 75 mg by mouth daily.      Marland Kitchen doxazosin (CARDURA) 4 MG tablet Take 4 mg by mouth at bedtime.      Marland Kitchen levothyroxine (SYNTHROID, LEVOTHROID) 100 MCG tablet Take 100 mcg by mouth daily.      . simvastatin (ZOCOR) 40 MG tablet Take 40 mg by mouth every evening.        Home: Home Living Lives With: Daughter;Son Available Help at Discharge: Family (per daughter someone is almost always in the  house with pt) Type of Home: House Home Access: Stairs to enter [employer] of Steps: 2-3 Entrance Stairs-Rails: Left Home Layout: One level Bathroom Shower/Tub: Forensic scientist: Standard Home Adaptive Equipment: Walker - rolling;Bedside commode/3-in-1;Hospital bed;Shower chair with back   Functional History: Prior Function Meal Prep: Total Light Housekeeping: Total Able to Take Stairs?: No Driving: No Vocation: Retired  Functional Status:  Mobility: Bed Mobility Bed Mobility: Supine to Sit;Sitting - Scoot to Edge of Bed Rolling Right: 4: Min assist Right Sidelying to Sit: 4: Min assist Supine to Sit: 4: Min guard;With rails;HOB elevated Sitting - Scoot to Edge of Bed: 4: Min guard Sit to Supine: 4: Min assist;HOB flat;With rail Scooting to [facility]: 4: Min guard;With rail (trendelenberg) Transfers Transfers: Sit to Stand;Stand to Sit Sit to Stand: 4: Min guard;With upper extremity assist;From bed Stand to Sit: 4: Min guard;With upper extremity assist;With armrests;To chair/3-in-1 Ambulation/Gait Ambulation/Gait Assistance: 1: +2 Total assist Ambulation/Gait: Patient Percentage: 70% Ambulation Distance (Feet): 23 Feet (X 2) Assistive device: Rolling walker Ambulation/Gait Assistance Details: Decr standing/upright stability and unsteady gait Gait Pattern: Decreased stride length General Gait Details: did not test as nursing to start blood transfusion    ADL: ADL Eating/Feeding: Simulated;Set up Where Assessed - Eating/Feeding: Bed level Grooming: Performed;Wash/dry hands;Wash/dry face;Supervision/safety;Set up (with Minimal A for standing balance) Where Assessed - Grooming: Unsupported standing Upper Body Bathing: Simulated;Minimal assistance Where Assessed - Upper Body Bathing: Supported sitting Lower Body Bathing: Simulated;Moderate assistance Where Assessed - Lower Body Bathing: Supported sit to stand Upper Body Dressing: Minimal  assistance Where Assessed - Upper Body Dressing: Supported sitting Lower Body Dressing: Simulated;Maximal assistance Where Assessed - Lower Body Dressing: Supported sit to Pharmacist, hospital: Mining engineer Method: Surveyor, minerals:  (Bed>door>sit in recliner>bed>sit in recliner) Equipment Used: Gait belt Transfers/Ambulation Related to ADLs: Min guard A sit to stand and stand to sit due to decreased safety with hand placement and impulsiveness; min A for ambulation ADL Comments: Pt was I/Mod I with all BADLs pta. Daughter reports that the most the patient does if no one is in the house with him is get up to his [facility].  Cognition: Cognition Overall Cognitive Status: Within Functional Limits for tasks assessed Arousal/Alertness: Awake/alert Orientation Level: Oriented to person;Oriented to place;Disoriented to time;Oriented to situation Cognition Arousal/Alertness: Awake/alert Behavior During Therapy: WFL for tasks assessed/performed Overall Cognitive Status: Within Functional Limits for tasks assessed  Physical Exam: Blood pressure 108/63, pulse 78, temperature 97.5 F (36.4 C), temperature source Oral, resp. rate 18, height 5\' 4"  (1.626 m), weight 82.736 kg (182 lb 6.4 oz), SpO2 98.00%.   Physical Exam  Nursing note and vitals reviewed. Constitutional: He is oriented to person, place, and time. He appears well-developed and well-nourished. No distress.  HENT:  Head: Normocephalic and atraumatic.  Right Ear: External ear normal.  Left Ear: External ear normal.  Poor dentition  Eyes: Right conjunctiva is injected. Right eye exhibits abnormal extraocular motion. Right pupil  is not reactive.  Right ptosis. cataracts  Neck: Normal range of motion. No JVD present. No tracheal deviation present. Thyromegaly present.  Cardiovascular: Normal rate and regular rhythm.   Pulmonary/Chest: Effort normal and breath sounds normal. No  respiratory distress. He has no wheezes.  Abdominal: Soft. Bowel sounds are normal. He exhibits no distension. There is no tenderness.  Musculoskeletal: He exhibits no edema.  Lymphadenopathy:    He has no cervical adenopathy.  Neurological: He is alert and oriented to person, place, and time.  Left sided weakness. Speech clear. Positive pronator drift. Generally weak however. LUE 2-3 prox to 3-4 distally. RUE is 3-4/5. LLE is 2 prox at HF to 3+ at ankle. RLE is 3+ to 4/5. Limited vision from right eye  Skin: Skin is warm.  Psychiatric:  Only basic insight and awareness. Inconsistent STM   No results found for this or any previous visit (from the past 48 hour(s)). No results found.  Post Admission Physician Evaluation: 1. Functional deficits secondary  to deconditioning after liver abscess, multiple medical. Has history of prior right CVA. 2. Patient is admitted to receive collaborative, interdisciplinary care between the physiatrist, rehab nursing staff, and therapy team. 3. Patient's level of medical complexity and substantial therapy needs in context of that medical necessity cannot be provided at a lesser intensity of care such as a SNF. 4. Patient has experienced substantial functional loss from his/her baseline which was documented above under the "Functional History" and "Functional Status" headings.  Judging by the patient's diagnosis, physical exam, and functional history, the patient has potential for functional progress which will result in measurable gains while on inpatient rehab.  These gains will be of substantial and practical use upon discharge  in facilitating mobility and self-care at the household level. 5. Physiatrist will provide 24 hour management of medical needs as well as oversight of the therapy plan/treatment and provide guidance as appropriate regarding the interaction of the two. 6. 24 hour rehab nursing will assist with bladder management, bowel management, safety,  skin/wound care, disease management, medication administration, pain management and patient education  and help integrate therapy concepts, techniques,education, etc. 7. PT will assess and treat for/with: Lower extremity strength, range of motion, stamina, balance, functional mobility, safety, adaptive techniques and equipment, NMR, education.   Goals are: supervision to mod I. 8. OT will assess and treat for/with: ADL's, functional mobility, safety, upper extremity strength, adaptive techniques and equipment, NMR, education.   Goals are: mod I to supervision. 9. SLP will assess and treat for/with: n/a.  Goals are: n/a. 10. Case Management and Social Worker will assess and treat for psychological issues and discharge planning. 11. Team conference will be held weekly to assess progress toward goals and to determine barriers to discharge. 12. Patient will receive at least 3 hours of therapy per day at least 5 days per week. 13. ELOS: 7-9 days      Prognosis:  good   Medical Problem List and Plan: 1. DVT Prophylaxis/Anticoagulation: Pharmaceutical: Lovenox 2. Pain Management:  N/A 3. Mood: currently disgusted due to not being able to eat. Will monitor and provide ego support. LCSW to follow along.  4. Neuropsych: This patient is  capable of making decisions on his own behalf. 5. Liver abscess: Continue unasyn D #9/21.  Follow up CT abdomen in next 1-2 days to decide about removing abdominal drain.  6. CM: continue coreg bid.   Ranelle Oyster, MD, [place] [facility]    03/12/2013

## 2013-03-12 NOTE — Progress Notes (Addendum)
[facility] PROGRESS NOTE  Brad Harrell WUJ:[id number] DOB: 26-Jun-1946 DOA: 03/04/2013 PCP: Dorrene German, MD Brief narrative:  67 year old male with a history of cardiomyopathy, hypertension, cocaine use, CVA with residual L leg weakness presented to ER  with a one-week history of nausea vomiting abdominal pain, constipation and lethargy. CT scan of the abdomen and pelvis showed multiloculated cystic liver lesion possible abscess of measuring 7.9 cm. Subsequently was seen by IR and had a drain placed, on IV abx per ID. Metabolic encephalopathy has resolved, also noted to have Iron deficiency anemia, seen by [facility] for Dr.Hung, scheduled for EGD/colonoscopy today. Also plan for CIR at DC   Assessment/Plan: 1. Liver abscess/cyst: -IR s/p aspiration and drain 5/7, Cultures-Gm stain with gram variable rod and Microaerophilic strep -Blood Cx with1/2 Coag negative staph: contaminant -Started Zosyn per Dr.Hatcher 5/8, then changed to Unasyn per Dr.Campbell 5/10 -Per ID, at discharge change to IV Ceftriaxone and Po flagyl  for 21days -Place PICC -Source unclear, given severe Iron defi anemia and constipation/liver abscess, seen by  GI Dr.Perry [place] covering for Dr.Hung -EGD/colonoscopy without significant findings. Plan for a capsule endoscopy today. -D/w IR, FU CT Abd pelvis tomorrow if improved, then remove drain.  2. Metabolic encephalopathy: - due to above -Resolved -MRI negative for CVA  3. H/o CVA -with residual hemiparesis -continue ASA/Plavix/statin  4. Anemia:Iron deficiency,  -based on anemia panel -no overt blood loss, transfused 2units PRBC 5/9, repeat Hb improved -EGD/colonoscopy within normal limits, capsule endoscopy today.  5. Constipation: On senokot/miralax  Ambulate, PT /OT  DVT proph: lovenox  Code Status: full  Family Communication: None today Disposition Plan: CIR soon  Consultants: IR ID  Antibiotics:  IV zosyn 5/8-5/10  IV Unasyn  5/10  HPI/Subjective: No complaints  Objective: Filed Vitals:   03/11/13 1440 03/11/13 1835 03/11/13 2132 03/12/13 0450  BP: 132/80 132/80 118/73 109/68  Pulse:  77 78 81  Temp: 97.1 F (36.2 C) 97.6 F (36.4 C) 97.4 F (36.3 C) 97.4 F (36.3 C)  TempSrc:  Oral Oral Oral  Resp: 18 17 18 18   Height:      Weight:      SpO2: 100% 100% 98% 97%    Intake/Output Summary (Last 24 hours) at 03/12/13 0845 Last data filed at 03/12/13 9562  Gross per 24 hour  Intake      5 ml  Output    966 ml  Net   -961 ml   Filed Weights   03/07/13 2202 03/08/13 2120 03/10/13 2054  Weight: 82.6 kg (182 lb 1.6 oz) 82.1 kg (181 lb) 82.736 kg (182 lb 6.4 oz)    Exam:   General:  AAO to self and place  Cardiovascular: S1S2/RRR  Respiratory: CTAB  Abdomen: soft, mild diffuse tenderness , drain noted slightly distended, BS present  Ext: mild residual L leg weakness   Data Reviewed: Basic Metabolic Panel:  Recent Labs Lab 03/06/13 0435 03/07/13 0647 03/08/13 0817 03/09/13 0500 03/10/13 0600  NA 138 138 139 137 137  K 4.6 4.0 4.0 4.0 4.0  CL 102 102 103 103 104  CO2 22 25 27 23 26   GLUCOSE 131* 107* 126* 116* 124*  BUN 17 18 15 11 10   CREATININE 1.19 1.17 1.14 0.89 0.83  CALCIUM 8.8 9.3 8.9 8.7 8.9   Liver Function Tests: No results found for this basename: AST, ALT, ALKPHOS, BILITOT, PROT, ALBUMIN,  in the last 168 hours No results found for this basename: LIPASE, AMYLASE,  in  the last 168 hours No results found for this basename: AMMONIA,  in the last 168 hours CBC:  Recent Labs Lab 03/06/13 0435 03/07/13 0647 03/08/13 0817 03/09/13 0500 03/10/13 0600  WBC 11.5* 11.2* 9.2 8.7 8.4  HGB 7.3* 6.5* 9.5* 9.1* 9.2*  HCT 22.5* 20.5* 29.0* 28.3* 28.6*  MCV 78.4 79.8 81.2 81.3 82.4  PLT 262 262 262 260 242   Cardiac Enzymes: No results found for this basename: CKTOTAL, CKMB, CKMBINDEX, TROPONINI,  in the last 168 hours BNP (last 3 results) No results found for this  basename: PROBNP,  in the last 8760 hours CBG:  Recent Labs Lab 03/08/13 1255 03/08/13 1839 03/09/13 0002 03/09/13 0732 03/09/13 1517  GLUCAP 134* 139* 148* 105* 125*    Recent Results (from the past 240 hour(s))  URINE CULTURE     Status: None   Collection Time    03/04/13  2:15 PM      Result Value Range Status   Specimen Description URINE, CLEAN CATCH   Final   Special Requests NONE   Final   Culture  Setup Time 03/04/2013 15:56   Final   Colony Count 10,000 COLONIES/ML   Final   Culture     Final   Value: METHICILLIN RESISTANT STAPHYLOCOCCUS AUREUS     Note: RIFAMPIN AND GENTAMICIN SHOULD NOT BE USED AS SINGLE DRUGS FOR TREATMENT OF STAPH INFECTIONS. CRITICAL RESULT CALLED TO, READ BACK BY AND VERIFIED WITH: DENISE BEASON @ 0142 ON 03/07/2013   Report Status 03/07/2013 FINAL   Final   Organism ID, Bacteria METHICILLIN RESISTANT STAPHYLOCOCCUS AUREUS   Final  CULTURE, BLOOD (ROUTINE X 2)     Status: None   Collection Time    03/04/13  7:30 PM      Result Value Range Status   Specimen Description BLOOD ARM LEFT   Final   Special Requests BOTTLES DRAWN AEROBIC AND ANAEROBIC 10CC   Final   Culture  Setup Time 03/05/2013 01:20   Final   Culture NO GROWTH 5 DAYS   Final   Report Status 03/11/2013 FINAL   Final  CULTURE, BLOOD (ROUTINE X 2)     Status: None   Collection Time    03/04/13  7:45 PM      Result Value Range Status   Specimen Description BLOOD HAND LEFT   Final   Special Requests BOTTLES DRAWN AEROBIC AND ANAEROBIC 10CC   Final   Culture  Setup Time 03/05/2013 01:19   Final   Culture     Final   Value: STAPHYLOCOCCUS SPECIES (COAGULASE NEGATIVE)     Note: THE SIGNIFICANCE OF ISOLATING THIS ORGANISM FROM A SINGLE SET OF BLOOD CULTURES WHEN MULTIPLE SETS ARE DRAWN IS UNCERTAIN. PLEASE NOTIFY THE MICROBIOLOGY DEPARTMENT WITHIN ONE WEEK IF SPECIATION AND SENSITIVITIES ARE REQUIRED.     Note: Gram Stain Report Called to,Read Back By and Verified With: TINA ISAACS ON  03/06/2013 AT 12:40A BY WILEJ   Report Status 03/06/2013 FINAL   Final  ANAEROBIC CULTURE     Status: None   Collection Time    03/05/13 11:28 AM      Result Value Range Status   Specimen Description ABSCESS LIVER   Final   Special Requests NONE   Final   Gram Stain PENDING   Incomplete   Culture     Final   Value: NO ANAEROBES ISOLATED; CULTURE IN PROGRESS FOR 5 DAYS   Report Status PENDING   Incomplete  CULTURE, ROUTINE-ABSCESS  Status: None   Collection Time    03/05/13 11:28 AM      Result Value Range Status   Specimen Description ABSCESS LIVER   Final   Special Requests NONE   Final   Gram Stain     Final   Value: FEW WBC PRESENT,BOTH PMN AND MONONUCLEAR     NO SQUAMOUS EPITHELIAL CELLS SEEN     MODERATE GRAM VARIABLE ROD   Culture     Final   Value: MODERATE MICROAEROPHILIC STREPTOCOCCI     Note: Standardized susceptibility testing for this organism is not available.   Report Status 03/08/2013 FINAL   Final  CULTURE, BLOOD (ROUTINE X 2)     Status: None   Collection Time    03/06/13  2:18 PM      Result Value Range Status   Specimen Description BLOOD LEFT ANTECUBITAL   Final   Special Requests BOTTLES DRAWN AEROBIC AND ANAEROBIC 10CC   Final   Culture  Setup Time 03/07/2013 00:16   Final   Culture     Final   Value:        BLOOD CULTURE RECEIVED NO GROWTH TO DATE CULTURE WILL BE HELD FOR 5 DAYS BEFORE ISSUING A FINAL NEGATIVE REPORT   Report Status PENDING   Incomplete     Studies: No results found.  Scheduled Meds: . ampicillin-sulbactam (UNASYN) IV  3 g Intravenous Q6H  . carvedilol  6.25 mg Oral BID WC  . docusate sodium  100 mg Oral BID  . enoxaparin (LOVENOX) injection  40 mg Subcutaneous Daily  . feeding supplement  237 mL Oral BID BM  . multivitamin with minerals  1 tablet Oral Daily  . polyethylene glycol  17 g Oral Daily  . saccharomyces boulardii  250 mg Oral BID  . senna  1 tablet Oral BID  . sodium chloride  3 mL Intravenous Q12H   Continuous  Infusions:    Active Problems:   Cardiomyopathy   H/O cocaine abuse   Stroke   Liver abscess   Encephalopathy, metabolic   Anemia   Bacteremia   Anemia, iron deficiency    Time spent:    [facility]  [facility] Pager [phone]. If 7PM-7AM, please contact night-coverage at [email or web address], password [facility] 03/12/2013, 8:45 AM  LOS: 8 days

## 2013-03-12 NOTE — Progress Notes (Signed)
Subjective: No acute events.  Feels well.  Objective: Vital signs in last 24 hours: Temp:  [97.1 F (36.2 C)-98.6 F (37 C)] 97.4 F (36.3 C) (05/14 0450) Pulse Rate:  [77-81] 81 (05/14 0450) Resp:  [12-20] 18 (05/14 0450) BP: (102-145)/(61-113) 109/68 mmHg (05/14 0450) SpO2:  [95 %-100 %] 97 % (05/14 0450) Last BM Date:  (PTA)  Intake/Output from previous day: 05/13 0701 - 05/14 0700 In: -  Out: 952 [Urine:950; Stool:2] Intake/Output this shift:    General appearance: alert and no distress GI: soft, non-tender; bowel sounds normal; no masses,  no organomegaly  Lab Results:  Recent Labs  03/10/13 0600  WBC 8.4  HGB 9.2*  HCT 28.6*  PLT 242   BMET  Recent Labs  03/10/13 0600  NA 137  K 4.0  CL 104  CO2 26  GLUCOSE 124*  BUN 10  CREATININE 0.83  CALCIUM 8.9   LFT No results found for this basename: PROT, ALBUMIN, AST, ALT, ALKPHOS, BILITOT, BILIDIR, IBILI,  in the last 72 hours PT/INR No results found for this basename: LABPROT, INR,  in the last 72 hours Hepatitis Panel No results found for this basename: HEPBSAG, HCVAB, HEPAIGM, HEPBIGM,  in the last 72 hours C-Diff No results found for this basename: CDIFFTOX,  in the last 72 hours Fecal Lactopherrin No results found for this basename: FECLLACTOFRN,  in the last 72 hours  Studies/Results: No results found.  Medications:  Scheduled: . ampicillin-sulbactam (UNASYN) IV  3 g Intravenous Q6H  . carvedilol  6.25 mg Oral BID WC  . docusate sodium  100 mg Oral BID  . enoxaparin (LOVENOX) injection  40 mg Subcutaneous Daily  . feeding supplement  237 mL Oral BID BM  . multivitamin with minerals  1 tablet Oral Daily  . polyethylene glycol  17 g Oral Daily  . saccharomyces boulardii  250 mg Oral BID  . senna  1 tablet Oral BID  . sodium chloride  3 mL Intravenous Q12H   Continuous:   Assessment/Plan: 1) Iron Deficiency Anemia. 2) Liver abscess. 3) Colonic polyp   No acute events.  The patient  feels well at this time.  No clear GI source for the cause of the liver abscess.  I will complete his iron deficiency work up with a capsule endoscopy.  Plan: 1) Capsule endoscopy.  LOS: 8 days   Jamayah Myszka D 03/12/2013, 7:27 AM

## 2013-03-12 NOTE — H&P (View-Only) (Signed)
Physical Medicine and Rehabilitation Admission H&P    Chief Complaint  Patient presents with  .   : HPI: Brad Harrell is a 67 y.o. male with history of CVA with left hemiparesis, non-ischemic CM, h/o cocaine abuse, chronic foley for retention; admitted on 03/04/13 with complaints of abdominal pain, N/V, right facial weakness and lethargy. MRI with remote right thalamic infarct and right superior frontal infarct and no acute findings. CT abdomen with 7.6 cm liver abscess that was treated with drain placement by IVR. He was started on broad spectrum antibiotics and Dr. Hatcher was consulted for input. 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 He felt that one blood culture positive for staph likely contaminant and UC with MRSA due to colonization. Antibiotics narrowed to ampicillin sulbactam. GI consulted for workup of iron deficiency anemia and due to concerns of occult liver lesion. EGD done revealing gastric erosions and non-bleeding upper duodenal AVMs. Colonoscopy done and polyp biopsied with recommendations to avoid anticoagulation by Dr. Hung. No GI cause of liver abscess and capsule endoscopy ordered for work up of iron deficiency anemia.   Follow up CT abdomen to be repeated prior to removing drain. ID recommends 21 days of IV antibiotic--could consider change to IV ceftriaxone and po flagyl at discharge then repeat  CT to see if Tx completed . [place] serology pending. Therapies initiated and patient noted to have slow movements with decreased posture. Therapy team are commending CIR and patient admitted for follow up therapies.     Review of Systems  HENT: Negative for hearing loss and neck pain.   Eyes: Negative for discharge.       Blind right eye  Respiratory: Negative for cough and shortness of breath.   Cardiovascular: Negative for chest pain and palpitations.  Gastrointestinal: Positive for nausea and abdominal pain. Negative for constipation.   Genitourinary: Negative for urgency and frequency.  Musculoskeletal: Negative for myalgias.  Neurological: Negative for headaches.   Past Medical History  Diagnosis Date  . Hypertension   . Urinary retention   . Hyperlipidemia   . Cardiomyopathy   . H/O cocaine abuse   . Hypothyroidism   . Stroke     "weaker on my left side since" (03/04/2013)  . Liver abscess     measuring 7 x 9 cm/notes 03/04/2013  . Anemia     Iron Defficiency   Past Surgical History  Procedure Laterality Date  . Appendectomy    . Inguinal hernia repair Right   . Tibia fracture surgery Left   . Colonoscopy N/A 03/11/2013    Procedure: COLONOSCOPY;  Surgeon: Patrick D Hung, MD;  Location: [facility] ENDOSCOPY;  Service: Endoscopy;  Laterality: N/A;  . Esophagogastroduodenoscopy N/A 03/11/2013    Procedure: ESOPHAGOGASTRODUODENOSCOPY (EGD);  Surgeon: Patrick D Hung, MD;  Location: [facility] ENDOSCOPY;  Service: Endoscopy;  Laterality: N/A;   History reviewed. No pertinent family history. Social History:  reports that he has quit smoking. His smoking use included Cigarettes. He has a 51 pack-year smoking history. He does not have any smokeless tobacco history on file. He reports that  drinks alcohol. He reports that he uses illicit drugs (Cocaine). Allergies: No Known Allergies Medications Prior to Admission  Medication Sig Dispense Refill  . aspirin 81 MG chewable tablet Chew 81 mg by mouth daily.      . carvedilol (COREG) 3.125 MG tablet Take 3.125 mg by mouth 2 (two) times daily with a meal.      . clopidogrel (PLAVIX) 75 MG   tablet Take 75 mg by mouth daily.      . doxazosin (CARDURA) 4 MG tablet Take 4 mg by mouth at bedtime.      . levothyroxine (SYNTHROID, LEVOTHROID) 100 MCG tablet Take 100 mcg by mouth daily.      . simvastatin (ZOCOR) 40 MG tablet Take 40 mg by mouth every evening.        Home: Home Living Lives With: Daughter;Son Available Help at Discharge: Family (per daughter someone is almost always in the  house with pt) Type of Home: House Home Access: Stairs to enter Entrance Stairs-Number of Steps: 2-3 Entrance Stairs-Rails: Left Home Layout: One level Bathroom Shower/Tub: Tub/shower unit;Curtain Bathroom Toilet: Standard Home Adaptive Equipment: Walker - rolling;Bedside commode/3-in-1;Hospital bed;Shower chair with back   Functional History: Prior Function Meal Prep: Total Light Housekeeping: Total Able to Take Stairs?: No Driving: No Vocation: Retired  Functional Status:  Mobility: Bed Mobility Bed Mobility: Supine to Sit;Sitting - Scoot to Edge of Bed Rolling Right: 4: Min assist Right Sidelying to Sit: 4: Min assist Supine to Sit: 4: Min guard;With rails;HOB elevated Sitting - Scoot to Edge of Bed: 4: Min guard Sit to Supine: 4: Min assist;HOB flat;With rail Scooting to HOB: 4: Min guard;With rail (trendelenberg) Transfers Transfers: Sit to Stand;Stand to Sit Sit to Stand: 4: Min guard;With upper extremity assist;From bed Stand to Sit: 4: Min guard;With upper extremity assist;With armrests;To chair/3-in-1 Ambulation/Gait Ambulation/Gait Assistance: 1: +2 Total assist Ambulation/Gait: Patient Percentage: 70% Ambulation Distance (Feet): 23 Feet (X 2) Assistive device: Rolling walker Ambulation/Gait Assistance Details: Decr standing/upright stability and unsteady gait Gait Pattern: Decreased stride length General Gait Details: did not test as nursing to start blood transfusion    ADL: ADL Eating/Feeding: Simulated;Set up Where Assessed - Eating/Feeding: Bed level Grooming: Performed;Wash/dry hands;Wash/dry face;Supervision/safety;Set up (with Minimal A for standing balance) Where Assessed - Grooming: Unsupported standing Upper Body Bathing: Simulated;Minimal assistance Where Assessed - Upper Body Bathing: Supported sitting Lower Body Bathing: Simulated;Moderate assistance Where Assessed - Lower Body Bathing: Supported sit to stand Upper Body Dressing: Minimal  assistance Where Assessed - Upper Body Dressing: Supported sitting Lower Body Dressing: Simulated;Maximal assistance Where Assessed - Lower Body Dressing: Supported sit to stand Toilet Transfer: Simulated;Minimal assistance Toilet Transfer Method: Stand pivot Toilet Transfer Equipment:  (Bed>door>sit in recliner>bed>sit in recliner) Equipment Used: Gait belt Transfers/Ambulation Related to ADLs: Min guard A sit to stand and stand to sit due to decreased safety with hand placement and impulsiveness; min A for ambulation ADL Comments: Pt was I/Mod I with all BADLs pta. Daughter reports that the most the patient does if no one is in the house with him is get up to his BSC.  Cognition: Cognition Overall Cognitive Status: Within Functional Limits for tasks assessed Arousal/Alertness: Awake/alert Orientation Level: Oriented to person;Oriented to place;Disoriented to time;Oriented to situation Cognition Arousal/Alertness: Awake/alert Behavior During Therapy: WFL for tasks assessed/performed Overall Cognitive Status: Within Functional Limits for tasks assessed  Physical Exam: Blood pressure 108/63, pulse 78, temperature 97.5 F (36.4 C), temperature source Oral, resp. rate 18, height 5' 4" (1.626 m), weight 82.736 kg (182 lb 6.4 oz), SpO2 98.00%.   Physical Exam  Nursing note and vitals reviewed. Constitutional: He is oriented to person, place, and time. He appears well-developed and well-nourished. No distress.  HENT:  Head: Normocephalic and atraumatic.  Right Ear: External ear normal.  Left Ear: External ear normal.  Poor dentition  Eyes: Right conjunctiva is injected. Right eye exhibits abnormal extraocular motion. Right pupil   is not reactive.  Right ptosis. cataracts  Neck: Normal range of motion. No JVD present. No tracheal deviation present. Thyromegaly present.  Cardiovascular: Normal rate and regular rhythm.   Pulmonary/Chest: Effort normal and breath sounds normal. No  respiratory distress. He has no wheezes.  Abdominal: Soft. Bowel sounds are normal. He exhibits no distension. There is no tenderness.  Musculoskeletal: He exhibits no edema.  Lymphadenopathy:    He has no cervical adenopathy.  Neurological: He is alert and oriented to person, place, and time.  Left sided weakness. Speech clear. Positive pronator drift. Generally weak however. LUE 2-3 prox to 3-4 distally. RUE is 3-4/5. LLE is 2 prox at HF to 3+ at ankle. RLE is 3+ to 4/5. Limited vision from right eye  Skin: Skin is warm.  Psychiatric:  Only basic insight and awareness. Inconsistent STM   No results found for this or any previous visit (from the past 48 hour(s)). No results found.  Post Admission Physician Evaluation: 1. Functional deficits secondary  to deconditioning after liver abscess, multiple medical. Has history of prior right CVA. 2. Patient is admitted to receive collaborative, interdisciplinary care between the physiatrist, rehab nursing staff, and therapy team. 3. Patient's level of medical complexity and substantial therapy needs in context of that medical necessity cannot be provided at a lesser intensity of care such as a SNF. 4. Patient has experienced substantial functional loss from his/her baseline which was documented above under the "Functional History" and "Functional Status" headings.  Judging by the patient's diagnosis, physical exam, and functional history, the patient has potential for functional progress which will result in measurable gains while on inpatient rehab.  These gains will be of substantial and practical use upon discharge  in facilitating mobility and self-care at the household level. 5. Physiatrist will provide 24 hour management of medical needs as well as oversight of the therapy plan/treatment and provide guidance as appropriate regarding the interaction of the two. 6. 24 hour rehab nursing will assist with bladder management, bowel management, safety,  skin/wound care, disease management, medication administration, pain management and patient education  and help integrate therapy concepts, techniques,education, etc. 7. PT will assess and treat for/with: Lower extremity strength, range of motion, stamina, balance, functional mobility, safety, adaptive techniques and equipment, NMR, education.   Goals are: supervision to mod I. 8. OT will assess and treat for/with: ADL's, functional mobility, safety, upper extremity strength, adaptive techniques and equipment, NMR, education.   Goals are: mod I to supervision. 9. SLP will assess and treat for/with: n/a.  Goals are: n/a. 10. Case Management and Social Worker will assess and treat for psychological issues and discharge planning. 11. Team conference will be held weekly to assess progress toward goals and to determine barriers to discharge. 12. Patient will receive at least 3 hours of therapy per day at least 5 days per week. 13. ELOS: 7-9 days      Prognosis:  good   Medical Problem List and Plan: 1. DVT Prophylaxis/Anticoagulation: Pharmaceutical: Lovenox 2. Pain Management:  N/A 3. Mood: currently disgusted due to not being able to eat. Will monitor and provide ego support. LCSW to follow along.  4. Neuropsych: This patient is  capable of making decisions on his own behalf. 5. Liver abscess: Continue unasyn D #9/21.  Follow up CT abdomen in next 1-2 days to decide about removing abdominal drain.  6. CM: continue coreg bid.   Zachary T. Swartz, MD, FAAPMR    03/12/2013 

## 2013-03-12 NOTE — Progress Notes (Signed)
Received patient from 6700 via wheelchair.  Alert and oriented x4.  Patient oriented to room and unit.  Vitals stable; denies pain.  Will continue to monitor.

## 2013-03-13 ENCOUNTER — Inpatient Hospital Stay (HOSPITAL_COMMUNITY): Payer: Medicare Other

## 2013-03-13 ENCOUNTER — Encounter (HOSPITAL_COMMUNITY): Payer: Self-pay | Admitting: Gastroenterology

## 2013-03-13 ENCOUNTER — Inpatient Hospital Stay (HOSPITAL_COMMUNITY): Payer: Medicare Other | Admitting: Occupational Therapy

## 2013-03-13 DIAGNOSIS — R5381 Other malaise: Secondary | ICD-10-CM | POA: Diagnosis present

## 2013-03-13 DIAGNOSIS — I69959 Hemiplegia and hemiparesis following unspecified cerebrovascular disease affecting unspecified side: Secondary | ICD-10-CM

## 2013-03-13 DIAGNOSIS — K750 Abscess of liver: Secondary | ICD-10-CM

## 2013-03-13 LAB — CBC WITH DIFFERENTIAL/PLATELET
Basophils Absolute: 0 10*3/uL (ref 0.0–0.1)
Eosinophils Absolute: 0.1 10*3/uL (ref 0.0–0.7)
Eosinophils Relative: 2 % (ref 0–5)
HCT: 29.5 % — ABNORMAL LOW (ref 39.0–52.0)
MCH: 26.8 pg (ref 26.0–34.0)
MCHC: 32.2 g/dL (ref 30.0–36.0)
MCV: 83.1 fL (ref 78.0–100.0)
Monocytes Absolute: 0.5 10*3/uL (ref 0.1–1.0)
Platelets: 236 10*3/uL (ref 150–400)
RDW: 16.4 % — ABNORMAL HIGH (ref 11.5–15.5)
WBC: 6.1 10*3/uL (ref 4.0–10.5)

## 2013-03-13 LAB — CULTURE, BLOOD (ROUTINE X 2): Culture: NO GROWTH

## 2013-03-13 LAB — COMPREHENSIVE METABOLIC PANEL
ALT: 22 U/L (ref 0–53)
AST: 30 U/L (ref 0–37)
Calcium: 8.8 mg/dL (ref 8.4–10.5)
Creatinine, Ser: 0.88 mg/dL (ref 0.50–1.35)
GFR calc Af Amer: >90 mL/min (ref >90)
GFR calc non Af Amer: 86 mL/min — ABNORMAL LOW (ref >90)
Sodium: 138 mEq/L (ref 135–145)
Total Protein: 6.8 g/dL (ref 6.0–8.3)

## 2013-03-13 MED ORDER — LIDOCAINE HCL 2 % EX GEL
CUTANEOUS | Status: DC | PRN
  Administered 2013-03-13: 5 via TOPICAL
  Filled 2013-03-13: qty 5

## 2013-03-13 MED ORDER — SODIUM CHLORIDE 0.9 % IJ SOLN
10.0000 mL | INTRAMUSCULAR | Status: DC | PRN
  Administered 2013-03-13 – 2013-03-21 (×10): 10 mL

## 2013-03-13 MED ORDER — ENSURE COMPLETE PO LIQD
120.0000 mL | Freq: Four times a day (QID) | ORAL | Status: DC
  Administered 2013-03-13 – 2013-03-18 (×8): 120 mL via ORAL

## 2013-03-13 NOTE — Progress Notes (Signed)
Patient information reviewed and entered into eRehab system by Lafonda Patron, RN, CRRN, PPS Coordinator.  Information including medical coding and functional independence measure will be reviewed and updated through discharge.     Per nursing patient was given "Data Collection Information Summary for Patients in Inpatient Rehabilitation Facilities with attached "Privacy Act Statement-Health Care Records" upon admission.  

## 2013-03-13 NOTE — Progress Notes (Signed)
Social Work Assessment and Plan Social Work Assessment and Plan  Patient Details  Name: Brad Harrell MRN: [id number] Date of Birth: 12-Apr-1946  Today's Date: 03/13/2013  Problem List:  Patient Active Problem List   Diagnosis Date Noted  . Physical deconditioning 03/13/2013  . Anemia, iron deficiency 03/08/2013  . Bacteremia 03/06/2013  . Liver abscess 03/05/2013  . Encephalopathy, metabolic 03/05/2013  . Anemia 03/05/2013  . Complicated UTI (urinary tract infection) 08/15/2012  . SIRS (systemic inflammatory response syndrome) 08/15/2012  . ARF (acute renal failure) 08/15/2012  . Hypertension   . Urinary retention   . Stroke   . Hyperlipidemia   . Cardiomyopathy   . H/O cocaine abuse    Past Medical History:  Past Medical History  Diagnosis Date  . Hypertension   . Urinary retention   . Hyperlipidemia   . Cardiomyopathy   . H/O cocaine abuse   . Hypothyroidism   . Stroke     "weaker on my left side since" (03/04/2013)  . Liver abscess     measuring 7 x 9 cm/notes 03/04/2013  . Anemia     Iron Defficiency   Past Surgical History:  Past Surgical History  Procedure Laterality Date  . Appendectomy    . Inguinal hernia repair Right   . Tibia fracture surgery Left   . Colonoscopy N/A 03/11/2013    Procedure: COLONOSCOPY;  Surgeon: Theda Belfast, MD;  Location: [facility] ENDOSCOPY;  Service: Endoscopy;  Laterality: N/A;  . Esophagogastroduodenoscopy N/A 03/11/2013    Procedure: ESOPHAGOGASTRODUODENOSCOPY (EGD);  Surgeon: Theda Belfast, MD;  Location[facility] ENDOSCOPY;  Service: Endoscopy;  Laterality: N/A;  . Givens capsule study N/A 03/12/2013    Procedure: GIVENS CAPSULE STUDY;  Surgeon: Theda Belfast, MD;  Location: [facility] ENDOSCOPY;  Service: Endoscopy;  Laterality: N/A;   Social History:  reports that he has quit smoking. His smoking use included Cigarettes. He has a 51 pack-year smoking history. He does not have any smokeless tobacco history on file. He reports that  drinks  alcohol. He reports that he uses illicit drugs (Cocaine).  Family / Support Systems Marital Status: Single Patient Roles: Parent Children: Pamela-daughter  [phone]-home  [phone]-cell Other Supports: Son lives with home currently not employed Anticipated Caregiver: Children Ability/Limitations of Caregiver: Someone is always there with pt-daughter works days but son is there Engineer, structural Availability: 24/7 Family Dynamics: Close knit family who rely upon one another.  Both children live with pt and are supportive and involved in his care.  Pt feels very fortunate to have them both.  Social History Preferred language: English Religion: Apostolic Cultural Background: No issues Education: High School Read: Yes Write: Yes Employment Status: Retired Fish farm manager Issues: No issues Guardian/Conservator: Noe-according to MD pt is capable of making his own decisions.   Abuse/Neglect Physical Abuse: Denies Verbal Abuse: Denies Sexual Abuse: Denies Exploitation of patient/patient's resources: Denies Self-Neglect: Denies  Emotional Status Pt's affect, behavior adn adjustment status: Pt is motivated and wants to go home soon.  He is doing way more than he does at home prior to admission.  He wants to go home to rest.  He is participating in therapies and making progress. Recent Psychosocial Issues: other medical issues-residual issues from CVA in 2010.  He has learned to live and function with them Pyschiatric History: No history-deferred depression screen due to feels doing well and has no concerns except to go home soon.  WIll continue to monitor his coping while here. Substance Abuse History:  History of cocaine abues but quit years ago according to pt.  No other issues  Patient / Family Perceptions, Expectations & Goals Pt/Family understanding of illness & functional limitations: Pt has a limited understanding of his condition, he relies upon his daughter to stay abreast of  his medical issues.  He is trying to get stronger and work on his transfers and walking so he can return home soon. 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  Community [employer] Agencies: None Premorbid Home Care/DME Agencies: Other (Comment) (Has had [facility] in the past) Transportation available at discharge: Family Resource referrals recommended: Support group (specify) (CVA Support group)  Discharge Planning Living Arrangements: Children Support Systems: Children;Friends/neighbors Type of Residence: Private residence Insurance Resources: [employer] Financial Resources: SSD;Family Support Financial Screen Referred: No Living Expenses: Lives with family Money Management: Family Do you have any problems obtaining your medications?: No Home Management: Children Patient/Family Preliminary Plans: Return home with children providing care if needed. He wants to go home soon.  He is doing well physically but medically still has a drain and would need to be cleared medically.  Pt reports he has 24 hour care at home if needed. Social Work Anticipated Follow Up Needs: HH/OP;Support Group  Clinical Impression Pt wants to be here a short time, not used to doing this much activity and has someone with him at home.  Aware would need to be medically cleared and more stable on his feet before discharge. Will have children come in to do family education prior to discharge, pt here in 2010 after CVA  Makaila Windle, Lemar Livings 03/13/2013, 2:59 PM

## 2013-03-13 NOTE — Progress Notes (Addendum)
Pat had capsule endoscopy procedure that need to  be removed at 2 am. Endoscopy was called but no one was working at night. A.C and rapid response were called and they advised to called the GI doctor on call. Deatra Ina, Pa was made aware. The GI doctor Dr Loreta Ave called back and stated "no to worry about it. Endoscopy will remove it in the day time".

## 2013-03-13 NOTE — Evaluation (Addendum)
Occupational Therapy Assessment and Plan  Patient Details  Name: Brad Harrell MRN: [id number] Date of Birth: May 26, 1946  OT Diagnosis: ataxia, cognitive deficits, disturbance of vision, hemiplegia affecting non-dominant side and muscle weakness (generalized) Rehab Potential: Rehab Potential: Good ELOS:   3-4 days  Today's Date: 03/13/2013 Time: 4403-4742 Time Calculation (min): 55 min  Problem List:  Patient Active Problem List   Diagnosis Date Noted  . Physical deconditioning 03/13/2013  . Anemia, iron deficiency 03/08/2013  . Bacteremia 03/06/2013  . Liver abscess 03/05/2013  . Encephalopathy, metabolic 03/05/2013  . Anemia 03/05/2013  . Complicated UTI (urinary tract infection) 08/15/2012  . SIRS (systemic inflammatory response syndrome) 08/15/2012  . ARF (acute renal failure) 08/15/2012  . Hypertension   . Urinary retention   . Stroke   . Hyperlipidemia   . Cardiomyopathy   . H/O cocaine abuse     Past Medical History:  Past Medical History  Diagnosis Date  . Hypertension   . Urinary retention   . Hyperlipidemia   . Cardiomyopathy   . H/O cocaine abuse   . Hypothyroidism   . Stroke     "weaker on my left side since" (03/04/2013)  . Liver abscess     measuring 7 x 9 cm/notes 03/04/2013  . Anemia     Iron Defficiency   Past Surgical History:  Past Surgical History  Procedure Laterality Date  . Appendectomy    . Inguinal hernia repair Right   . Tibia fracture surgery Left   . Colonoscopy N/A 03/11/2013    Procedure: COLONOSCOPY;  Surgeon: Theda Belfast, MD;  Location: [facility] ENDOSCOPY;  Service: Endoscopy;  Laterality: N/A;  . Esophagogastroduodenoscopy N/A 03/11/2013    Procedure: ESOPHAGOGASTRODUODENOSCOPY (EGD);  Surgeon: Theda Belfast, MD;  Location: [facility] ENDOSCOPY;  Service: Endoscopy;  Laterality: N/A;  . Givens capsule study N/A 03/12/2013    Procedure: GIVENS CAPSULE STUDY;  Surgeon: Theda Belfast, MD;  Location: [facility] ENDOSCOPY;  Service: Endoscopy;   Laterality: N/A;    Assessment & Plan Clinical Impression: Patient is a 67 y.o. male with history of CVA with left hemiparesis, non-ischemic CM, h/o cocaine abuse, chronic foley for retention; admitted on 03/04/13 with complaints of abdominal pain, N/V, right facial weakness and lethargy. MRI with remote right thalamic infarct and right superior frontal infarct and no acute findings. CT abdomen with 7.6 cm liver abscess that was treated with drain placement  Patient transferred to CIR on 03/12/2013 .    Patient currently requires min with basic self-care skills secondary to general weakness, ataxia and decreased coordination.  Prior to hospitalization, patient could complete basic ADL with min assist.  Patient will benefit from skilled intervention to increase independence with basic self-care skills prior to discharge home with care partner.  Anticipate patient will require intermittent supervision and no further OT follow recommended.  OT - End of Session Activity Tolerance: Tolerates 30+ min activity without fatigue;Endurance does not limit participation in activity Endurance Deficit: No OT Assessment Rehab Potential: Good Barriers to Discharge: None OT Plan OT Intensity: Minimum of 1-2 x/day, 45 to 90 minutes OT Frequency: 5 out of 7 days OT Treatment/Interventions: Balance/vestibular training;Cognitive remediation/compensation;Discharge planning;DME/adaptive equipment instruction;Functional mobility training;Patient/family education;Self Care/advanced ADL retraining;Therapeutic Activities;Therapeutic Exercise OT Recommendation Recommendations for Other Services:  (none) Patient destination: Home Follow Up Recommendations: None Equipment Recommended: None recommended by OT   Skilled Therapeutic Intervention Patient seen this am for OT evaluation of basic self care skills.  Patient's family not present for evaluation.  Patient able to accurately share reason for this admission.   Patient able to bathe and dress self seated at edge of bed with set up and minimal assistance. Patient with  Limited stand tolerance, and decreased dynamic stand balance.  Patient able to walk to bathroom for continent void with rolling walker and minimal assistance.  Patient reports having reliable help at home from his son and daughter.  Patient has necessary equipment for bathroom from previous admission.  Patient eager to return home.  Radiologist came in during OT session to report drain would come out in a few days.    OT Evaluation Precautions/Restrictions  Precautions Precautions: Fall Restrictions Weight Bearing Restrictions: No Other Position/Activity Restrictions: full liquid diet General Chart Reviewed: Yes Family/Caregiver Present: No   Pain Pain Assessment Pain Assessment: No/denies pain Home Living/Prior Functioning Home Living Lives With: Daughter Available Help at Discharge: Family Bathroom Shower/Tub: Tub/shower unit Home Adaptive Equipment: Tub transfer bench IADL History Homemaking Responsibilities: No Current License: No Leisure and Hobbies: Likes to look at TV Prior Function Level of Independence: Needs assistance with ADLs   Vision/Perception  Vision - History Patient Visual Report: No change from baseline  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  Eye lid remains partially closed. Cognition Overall Cognitive Status: History of cognitive impairments - at baseline (Memory, and problem solving reduced since CVA?) Orientation Level: Oriented to person;Oriented to place;Disoriented to time;Oriented to situation Sensation Sensation Light Touch: Not tested Stereognosis: Not tested Hot/Cold: Not tested Proprioception: Not tested Coordination Gross Motor Movements are Fluid and Coordinated: No (Dysmetria left arm and leg more noted in open chain) Motor  Motor Motor: Hemiplegia (chronic hemiparesis)    Trunk/Postural Assessment  Cervical Assessment Cervical  Assessment: Exceptions to [facility] (head biased toward left.  ? comp for vision?) Thoracic Assessment Thoracic Assessment: Within Functional Limits (kyphotic) Lumbar Assessment Lumbar Assessment: Within Functional Limits (posterior pelvis) Postural Control Postural Control:  (balance improved with increased time in unsupported sitting)  Balance Balance Balance Assessed: Yes Static Sitting Balance Static Sitting - Balance Support: No upper extremity supported;Feet supported Static Sitting - Level of Assistance: 5: Stand by assistance Static Sitting - Comment/# of Minutes: 30 Static Standing Balance Static Standing - Balance Support: During functional activity Static Standing - Level of Assistance: 4: Min assist Dynamic Standing Balance Dynamic Standing - Balance Support: During functional activity Dynamic Standing - Level of Assistance: 4: Min assist Dynamic Standing - Comments: Decreased tolerance, postyerior preference, large base of support Extremity/Trunk Assessment RUE Assessment RUE Assessment: Within Functional Limits LUE Assessment LUE Assessment: Within Functional Limits  FIM:  FIM - Eating Eating Activity: 0: Activity did not occur FIM - Grooming Grooming Steps: Wash, rinse, dry face;Wash, rinse, dry hands;Oral care, brush teeth, clean dentures;Brush, comb hair Grooming: 5: Set-up assist to obtain items FIM - Bathing Bathing Steps Patient Completed: Chest;Right Arm;Left Arm;Abdomen;Front perineal area;Buttocks;Right upper leg;Right lower leg (including foot);Left upper leg;Left lower leg (including foot) Bathing: 4: Steadying assist FIM - Upper Body Dressing/Undressing Upper body dressing/undressing steps patient completed: Thread/unthread right sleeve of pullover shirt/dresss;Thread/unthread left sleeve of pullover shirt/dress;Put head through opening of pull over shirt/dress;Pull shirt over trunk Upper body dressing/undressing: 5: Set-up assist to: Obtain clothing/put  away FIM - Lower Body Dressing/Undressing Lower body dressing/undressing steps patient completed: Thread/unthread right pants leg;Pull pants up/down;Fasten/unfasten pants;Don/Doff right sock;Don/Doff left sock Lower body dressing/undressing: 4: Min-Patient completed 75 plus % of tasks FIM - Toileting Toileting steps completed by patient: Adjust clothing prior to toileting;Performs perineal hygiene;Adjust clothing  after toileting Toileting: 4: Steadying assist FIM - Banker Devices: Therapist, occupational: 6: Assistive device: no helper;6: Supine > Sit: No assist;6: Sit > Supine: No assist;4: Bed > Chair or W/C: Min A (steadying Pt. > 75%);4: Chair or W/C > Bed: Min A (steadying Pt. > 75%) FIM - Diplomatic Services operational officer Devices: Art gallery manager Transfers: 4-To toilet/BSC: Min A (steadying Pt. > 75%);4-From toilet/BSC: Min A (steadying Pt. > 75%)   Refer to Care Plan for Long Term Goals  Recommendations for other services: None  Discharge Criteria: Patient will be discharged from OT if patient refuses treatment 3 consecutive times without medical reason, if treatment goals not met, if there is a change in medical status, if patient makes no progress towards goals or if patient is discharged from hospital.  The above assessment, treatment plan, treatment alternatives and goals were discussed and mutually agreed upon: by patient  Collier Salina 03/13/2013, 11:51 AM

## 2013-03-13 NOTE — Progress Notes (Signed)
Nursing Note:D: pt incont. And scanned for 422 cc. A:  Pt in and out cathed by NT for 475 cc. Pt tolerated well.wbb

## 2013-03-13 NOTE — Progress Notes (Signed)
Patient ID: Brad Harrell, male   DOB: 08/12/1946, 67 y.o.   MRN: [id number] 67 y.o. male with history of CVA with left hemiparesis, non-ischemic CM, h/o cocaine abuse, chronic foley for retention; admitted on 03/04/13 with complaints of abdominal pain, N/V, right facial weakness and lethargy. MRI with remote right thalamic infarct and right superior frontal infarct and no acute findings. CT abdomen with 7.6 cm liver abscess that was treated with drain placement by IVR. He was started on broad spectrum antibiotics and Dr. Ninetta Lights was consulted for input. 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 He felt that one blood culture positive for staph likely contaminant and UC with MRSA due to colonization. Antibiotics narrowed to ampicillin sulbactam. GI consulted for workup of iron deficiency anemia and due to concerns of occult liver lesion. EGD done revealing gastric erosions and non-bleeding upper duodenal AVMs. Colonoscopy done and polyp biopsied with recommendations to avoid anticoagulation by Dr. Elnoria Howard Subjective/Complaints:  Review of Systems  Gastrointestinal: Positive for abdominal pain. Negative for nausea and vomiting.  All other systems reviewed and are negative.   Objective: Vital Signs: Blood pressure 125/83, pulse 93, temperature 97.4 F (36.3 C), temperature source Oral, resp. rate 19, weight 84.6 kg (186 lb 8.2 oz), SpO2 99.00%. Ct Abdomen Pelvis W Contrast  03/12/2013   *RADIOLOGY REPORT*  Clinical Data: Follow-up liver abscess.  CT ABDOMEN AND PELVIS WITH CONTRAST  Technique:  Multidetector CT imaging of the abdomen and pelvis was performed following the standard protocol during bolus administration of intravenous contrast.  Contrast: OMNIPAQUE IOHEXOL 300 MG/ML  SOLN 03/04/2013  Comparison: 03/04/2013  Findings: Pigtail drainage catheter is noted within the large abscess in the left hepatic lobe.  This is decreased significantly in size.  Near complete  resolution of the fluid component.  There continues to be an area of hypodensity surrounding the drainage catheter within the liver, likely inflammatory process.  This area measures 5.9 cm.  When measuring the cystic component and solid component previously, the entire area measured up to 9.6 cm.  There is a small amount of fluid likely in a subcapsular location along the anterior surface of the liver into the area is.  These are new since prior study.  The largest area is noted inferiorly and measures up to 2.0 cm.  Gallbladder, spleen, pancreas, adrenals and kidneys are normal. Small effusions in the lungs bilaterally.  Minimal bibasilar atelectasis dependently.  Heart is mildly enlarged.  Large and small bowel grossly unremarkable.  Aorta and iliac vessels are heavily calcified, non-aneurysmal.  Foley catheters present in the bladder.  Trace free fluid in the pelvis.  IMPRESSION: Near complete resolution of the fluid component of the large left hepatic abscess.  Solid hypodense component remains, likely inflammation within the hepatic parenchyma.  There are to new small subcapsular fluid collections noted along the surface of the liver, the largest 2 cm.  Small bilateral pleural effusions.  Dependent atelectasis.   Original Report Authenticated By: Charlett Nose, M.D.   Results for orders placed during the hospital encounter of 03/12/13 (from the past 72 hour(s))  CREATININE, SERUM     Status: Abnormal   Collection Time    03/12/13  8:19 PM      Result Value Range   Creatinine, Ser 0.82  0.50 - 1.35 mg/dL   GFR calc non Af Amer 88 (*) >90 mL/min   GFR calc Af Amer >90  >90 mL/min   Comment:  The eGFR has been calculated     using the CKD EPI equation.     This calculation has not been     validated in all clinical     situations.     eGFR's persistently     <90 mL/min signify     possible Chronic Kidney Disease.     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 no acute distress   Assessment/Plan: 1. Functional deficits secondary to Deconditioning from liver abscess superimposed on chronic left hemiparesis from CVA which require 3+ hours per day of interdisciplinary therapy in a comprehensive inpatient rehab setting. Physiatrist is providing close team supervision and 24 hour management of active medical problems listed below. Physiatrist and rehab team continue to assess barriers to discharge/monitor patient progress toward functional and medical goals. FIM:                   Comprehension Comprehension Mode: Auditory Comprehension: 4-Understands basic 75 - 89% of the time/requires cueing 10 - 24% of the time  Expression Expression: 5-Expresses basic 90% of the time/requires cueing < 10% of the time.  Social Interaction Social Interaction: 4-Interacts appropriately 75 - 89% of the time - Needs redirection for appropriate language or to initiate interaction.  Problem Solving Problem Solving: 4-Solves basic 75 - 89% of the time/requires cueing 10 - 24% of the time  Memory Memory: 5-Recognizes or recalls 90% of the time/requires cueing < 10% of the time  Medical Problem List and Plan:  1. DVT Prophylaxis/Anticoagulation: Pharmaceutical: Lovenox  2. Pain Management: N/A  3. Mood:Will monitor and provide ego support. LCSW to follow along.  4. Neuropsych: This patient is capable of making decisions on his own behalf.  5. Liver abscess: Continue unasyn D #9/21. Follow up CT abdomen in next 1-2 days to decide about removing abdominal drain.  6. CM: continue coreg bid.      LOS (Days) 1 A FACE TO FACE EVALUATION WAS  PERFORMED  KIRSTEINS,ANDREW E 03/13/2013, 6:50 AM

## 2013-03-13 NOTE — Care Management Note (Signed)
[facility] Individual Statement of Services  Patient Name:  Brad Harrell  Date:  03/13/2013  Welcome to the [facility]patient [facility].  Our goal is to provide you with an individualized program based on your diagnosis and situation, designed to meet your specific needs.  With this comprehensive rehabilitation program, you will be expected to participate in at least 3 hours of rehabilitation therapies Monday-Friday, with modified therapy programming on the weekends.  Your rehabilitation program will include the following services:  Physical Therapy (PT), Occupational Therapy (OT), 24 hour per day rehabilitation nursing, Case Management ( Social Worker), Rehabilitation Medicine, Nutrition Services and Pharmacy Services  Weekly team conferences will be held on Wednesday to discuss your progress.  Your Social Worker will talk with you frequently to get your input and to update you on team discussions.  Team conferences with you and your family in attendance may also be held.  Expected length of stay: 7 days Overall anticipated outcome: mod/i-supervision level  Depending on your progress and recovery, your program may change. Your Social Worker will coordinate services and will keep you informed of any changes. Your Child psychotherapist names and contact numbers are listed  below.  The following services may also be recommended but are not provided by the Inpatient [facility]:  Home Health Rehabiltiation Services  Outpatient Rehabilitatation Servives    Arrangements will be made to provide these services after discharge if needed.  Arrangements include referral to agencies that provide these services.  Your insurance has been verified to be:  Medicare Your primary doctor is:  Dr Concepcion Elk  Pertinent information will be shared with your doctor and your insurance company.  Social Worker:  Dossie Der, [place] [phone]  Information discussed with and copy given  to patient by: Lucy Chris, 03/13/2013, 2:40 PM

## 2013-03-13 NOTE — Progress Notes (Addendum)
INITIAL NUTRITION ASSESSMENT  DOCUMENTATION CODES Per approved criteria  -Obesity Unspecified   INTERVENTION: 1. Ensure Complete PO BID 2. RD to continue to follow nutrition care plan.  NUTRITION DIAGNOSIS: Inadequate oral intake related to poor appetite as evidenced by variable meal intake.  Goal: Intake to meet >90% of estimated nutrition needs.  Monitor:  weight trends, lab trends, I/O's, PO intake, supplement tolerance  Reason for Assessment: Malnutrition Screening  67 y.o. male  Admitting Dx: large liver abscess  ASSESSMENT: Admitted with decreased appetite, malaise, abdominal pain, and possible R facial droop x 1 - 2 weeks. ER work-up reveals liver abscess. Underwent liver drain placement   Per MD, plan to follow up CT of abdomen in next 1-2 days to decide about removing abdominal drain.   12.5% wt loss x 7 months - this is significant. Pt is sleeping - unable to obtain hx at this time. Pt is at nutrition risk 2/2 current acute illness and polysubstance abuse. Pt was upset with lack of diet order progression but is now advanced to Heart Healthy diet. Most recent meal completion 100%.  Currently ordered for Ensure Complete po BID.  Height: Ht Readings from Last 1 Encounters:  03/12/13 5\' 4"  (1.626 m)    Weight: Wt Readings from Last 1 Encounters:  03/12/13 186 lb 8.2 oz (84.6 kg)    Ideal Body Weight: 130 lb  % Ideal Body Weight: 143%  Wt Readings from Last 10 Encounters:  03/12/13 186 lb 8.2 oz (84.6 kg)  03/12/13 182 lb (82.555 kg)  03/12/13 182 lb (82.555 kg)  03/12/13 182 lb (82.555 kg)  08/17/12 200 lb 2.8 oz (90.8 kg)    Usual Body Weight: 200 lb  % Usual Body Weight: 93%; 12.5% wt loss x 7 months  BMI:  Body mass index is 32 kg/(m^2). Obese Class I  Estimated Nutritional Needs: Kcal: 1700 - 1900 kcal Protein: 75 - 90 grams Fluid: 1.7 - 1.9 liters  Skin: R abdominal incision  Diet Order: Cardiac  EDUCATION NEEDS: -No education needs  identified at this time   Intake/Output Summary (Last 24 hours) at 03/13/13 1229 Last data filed at 03/13/13 1040  Gross per 24 hour  Intake    365 ml  Output   2110 ml  Net  -1745 ml    Last BM: 5/15  Labs:   Recent Labs Lab 03/09/13 0500 03/10/13 0600 03/12/13 2019 03/13/13 0550  NA 137 137  --  138  K 4.0 4.0  --  3.8  CL 103 104  --  104  CO2 23 26  --  23  BUN 11 10  --  6  CREATININE 0.89 0.83 0.82 0.88  CALCIUM 8.7 8.9  --  8.8  GLUCOSE 116* 124*  --  93    CBG (last 3)  No results found for this basename: GLUCAP,  in the last 72 hours  Scheduled Meds: . ampicillin-sulbactam (UNASYN) IV  3 g Intravenous Q6H  . carvedilol  6.25 mg Oral BID WC  . docusate sodium  100 mg Oral BID  . enoxaparin (LOVENOX) injection  40 mg Subcutaneous Q24H  . feeding supplement  237 mL Oral BID BM  . multivitamin with minerals  1 tablet Oral Daily  . polyethylene glycol  17 g Oral Daily  . saccharomyces boulardii  250 mg Oral BID  . senna  1 tablet Oral BID    Continuous Infusions:    Past Medical History  Diagnosis Date  . Hypertension   .  Urinary retention   . Hyperlipidemia   . Cardiomyopathy   . H/O cocaine abuse   . Hypothyroidism   . Stroke     "weaker on my left side since" (03/04/2013)  . Liver abscess     measuring 7 x 9 cm/notes 03/04/2013  . Anemia     Iron Defficiency    Past Surgical History  Procedure Laterality Date  . Appendectomy    . Inguinal hernia repair Right   . Tibia fracture surgery Left   . Colonoscopy N/A 03/11/2013    Procedure: COLONOSCOPY;  Surgeon: Theda Belfast, MD;  Location: [facility] ENDOSCOPY;  Service: Endoscopy;  Laterality: N/A;  . Esophagogastroduodenoscopy N/A 03/11/2013    Procedure: ESOPHAGOGASTRODUODENOSCOPY (EGD);  Surgeon: Theda Belfast, MD;  Location: [facility] ENDOSCOPY;  Service: Endoscopy;  Laterality: N/A;  . Givens capsule study N/A 03/12/2013    Procedure: GIVENS CAPSULE STUDY;  Surgeon: Theda Belfast, MD;  Location: [facility]  ENDOSCOPY;  Service: Endoscopy;  Laterality: N/A;    Jarold Motto MS, RD, LDN Pager: [phone] After-hours pager: [phone]

## 2013-03-13 NOTE — Progress Notes (Signed)
Subjective: Liver abscess drain placed 5/7 Pt feeling  much better, now in Rehab   Objective: Vital signs in last 24 hours: Temp:  [97.1 F (36.2 C)-97.8 F (36.6 C)] 97.8 F (36.6 C) (05/15 0600) Pulse Rate:  [73-93] 73 (05/15 0600) Resp:  [19-20] 20 (05/15 0600) BP: (125-149)/(55-91) 147/55 mmHg (05/15 0600) SpO2:  [98 %-99 %] 99 % (05/15 0600) Weight:  [182 lb (82.555 kg)-186 lb 8.2 oz (84.6 kg)] 186 lb 8.2 oz (84.6 kg) (05/14 1820) Last BM Date: 03/13/13  Intake/Output from previous day: 05/14 0701 - 05/15 0700 In: -  Out: 1950 [Urine:1950] Intake/Output this shift: Total I/O In: 365 [P.O.:360; Other:5] Out: 160 [Urine:150; Drains:10]  PE:  Up in bed Eating well Site of drain clean and dry; sl tender No bleeding Output 10 cc yesterday; scant amount in bulb now. Mostly serous  Lab Results:   Recent Labs  03/13/13 0550  WBC 6.1  HGB 9.5*  HCT 29.5*  PLT 236   BMET  Recent Labs  03/12/13 2019 03/13/13 0550  NA  --  138  K  --  3.8  CL  --  104  CO2  --  23  GLUCOSE  --  93  BUN  --  6  CREATININE 0.82 0.88  CALCIUM  --  8.8     Assessment/Plan:  Liver abscess drain placed 5/7 Doing well Repeat CT shows near complete resolution of liver abscess. Two small subcapsular fluid collections, not amenable to drainage and likely related to procedure.  Reviewed imaging with Dr. Grace Isaac, will watch drainage output next few days and if it remains minimal, will pull drain    LOS: 1 day    Brayton El 03/13/2013

## 2013-03-13 NOTE — Progress Notes (Signed)
Occupational Therapy Session Note  Patient Details  Name: Brad Harrell MRN: [id number] Date of Birth: 10/07/1946  Today's Date: 03/13/2013 Time: 1130-1200 Time Calculation (min): 30 min  Short Term Goals: Week 1:    STG = LTGs   Skilled Therapeutic Interventions/Progress Updates:  Pt seen for 1:1 OT with focus on functional mobility in home environment.  Ambulated in ADL apartment with RW with min assist and occasional verbal cues for LLE advancement and safety with RW with turns.  Pt completed tub/shower transfer with use of tub bench and RW with min assist, again requiring cues for safety with RW during transfer.  Pt ambulated ~30 feet with RW with no c/o pain or fatigue, with min assist due to occasional dragging of LLE.  Pt returned to bed with squat pivot transfer with min assist.  Therapy Documentation Precautions:  Precautions Precautions: Fall Restrictions Weight Bearing Restrictions: No Other Position/Activity Restrictions: full liquid diet General: General Chart Reviewed: Yes Family/Caregiver Present: No Vital Signs:   Pain: Pain Assessment Pain Assessment: No/denies pain  See FIM for current functional status  Therapy/Group: Individual Therapy  Leonette Monarch 03/13/2013, 12:19 PM

## 2013-03-13 NOTE — Evaluation (Signed)
Physical Therapy Assessment and Plan  Patient Details  Name: Brad Harrell MRN: [id number] Date of Birth: 14-Mar-1946  PT Diagnosis: Difficulty walking, Hemiparesis non-dominant and Muscle weakness Rehab Potential: Good ELOS: 7 - 10 days   Today's Date: 03/13/2013 Time: 4098-1191 Time Calculation (min): 85 min  Problem List:  Patient Active Problem List   Diagnosis Date Noted  . Physical deconditioning 03/13/2013  . Anemia, iron deficiency 03/08/2013  . Bacteremia 03/06/2013  . Liver abscess 03/05/2013  . Encephalopathy, metabolic 03/05/2013  . Anemia 03/05/2013  . Complicated UTI (urinary tract infection) 08/15/2012  . SIRS (systemic inflammatory response syndrome) 08/15/2012  . ARF (acute renal failure) 08/15/2012  . Hypertension   . Urinary retention   . Stroke   . Hyperlipidemia   . Cardiomyopathy   . H/O cocaine abuse     Past Medical History:  Past Medical History  Diagnosis Date  . Hypertension   . Urinary retention   . Hyperlipidemia   . Cardiomyopathy   . H/O cocaine abuse   . Hypothyroidism   . Stroke     "weaker on my left side since" (03/04/2013)  . Liver abscess     measuring 7 x 9 cm/notes 03/04/2013  . Anemia     Iron Defficiency   Past Surgical History:  Past Surgical History  Procedure Laterality Date  . Appendectomy    . Inguinal hernia repair Right   . Tibia fracture surgery Left   . Colonoscopy N/A 03/11/2013    Procedure: COLONOSCOPY;  Surgeon: Theda Belfast, MD;  Location: [facility] ENDOSCOPY;  Service: Endoscopy;  Laterality: N/A;  . Esophagogastroduodenoscopy N/A 03/11/2013    Procedure: ESOPHAGOGASTRODUODENOSCOPY (EGD);  Surgeon: Theda Belfast, MD;  Location: [facility] ENDOSCOPY;  Service: Endoscopy;  Laterality: N/A;  . Givens capsule study N/A 03/12/2013    Procedure: GIVENS CAPSULE STUDY;  Surgeon: Theda Belfast, MD;  Location: [facility] ENDOSCOPY;  Service: Endoscopy;  Laterality: N/A;    Assessment & Plan Clinical Impression: Brad Harrell is a  67 y.o. male with history of CVA with left hemiparesis, non-ischemic CM, h/o cocaine abuse, chronic foley for retention; admitted on 03/04/13 with complaints of abdominal pain, N/V, right facial weakness and lethargy. MRI with remote right thalamic infarct and right superior frontal infarct and no acute findings. CT abdomen with 7.6 cm liver abscess that was treated with drain placement by IVR. He was started on broad spectrum antibiotics and Dr. Ninetta Lights was consulted for input. 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 He felt that one blood culture positive for staph likely contaminant and UC with MRSA due to colonization. Antibiotics narrowed to ampicillin sulbactam. GI consulted for workup of iron deficiency anemia and due to concerns of occult liver lesion. EGD done revealing gastric erosions and non-bleeding upper duodenal AVMs. Colonoscopy done and polyp biopsied with recommendations to avoid anticoagulation by Dr. Elnoria Howard. No GI cause of liver abscess and capsule endoscopy ordered for work up of iron deficiency anemia.  Follow up CT abdomen to be repeated prior to removing drain. ID recommends 21 days of IV antibiotic--could consider change to IV ceftriaxone and po flagyl at discharge then repeat CT to see if Tx completed . [place] serology pending. Therapies initiated and patient noted to have slow movements with decreased posture. Therapy team are commending CIR and patient admitted to CIR on 03/12/13.   Patient currently requires mod with mobility secondary to muscle weakness and decreased coordination.  Prior to hospitalization, patient was modified independent  with mobility and lived with Son;Daughter in a House home.  Home access is 2Stairs to enter.  Patient will benefit from skilled PT intervention to maximize safe functional mobility and minimize fall risk for planned discharge home with intermittent assist.  Anticipate patient will benefit from follow up [facility] at  discharge.  PT - End of Session Activity Tolerance: Tolerates 30+ min activity with multiple rests PT Assessment Rehab Potential: Good Barriers to Discharge: Decreased caregiver support PT Plan PT Intensity: Minimum of 1-2 x/day ,45 to 90 minutes PT Frequency: 5 out of 7 days PT Duration Estimated Length of Stay: 7 - 10 days PT Treatment/Interventions: Ambulation/gait training;Balance/vestibular training;Discharge planning;Functional mobility training;Patient/family education;Stair training;Therapeutic Activities;Therapeutic Exercise;UE/LE Strength taining/ROM;Wheelchair propulsion/positioning PT Recommendation Follow Up Recommendations: Home health PT Patient destination: Home Equipment Recommended: None recommended by PT Equipment Details: patient has needed equipment  Skilled Therapeutic Intervention Patient resting in bed upon entering room. Patient participated in evaluation and treatment begun. Patient supine to sit with supervision HOB raised and bed rails. Patient performed bed to wheelchair transfer with min steady assist. Patient propelled wheelchair 50 feet with min assist due to IV. Patient ambulated with rolling walker 85 feet with min to occasional mod assist due to narrow base of support and losing balance to left. Patient ambulated up and down 5 steps with bilateral rails and min to mod assist. Patient reported being fatigued throughout session requiring rest breaks. Patient tested on BERG balance test and scored 7/56 making patient at severe fall risk. Patient performed step ups onto 4 inch step for LE strength and endurance training. Patient exercised on Nustep x 5 minutes on workload 4 with much encouragement. Patient pushed back to room via wheelchair. Patient transferred wheelchair to bed with min assist. Patient sit to supine with supervision and verbal cueing. Patient required verbal cueing frequently with sit to stands and ambulation to push up from chair and reach back for  wheelchair for safety. Patient with decreased control of descents with stand to sit.  Patient left in bed with all items in reach.  PT Evaluation Precautions/Restrictions Precautions Precautions: Fall;Other (comment) (contact precautions) Precaution Comments: bulb drain on right side Restrictions Weight Bearing Restrictions: No Other Position/Activity Restrictions: full liquid diet General Chart Reviewed: Yes Response to Previous Treatment: Patient with no complaints from previous session. Family/Caregiver Present: No Vital SignsTherapy Vitals Temp: 98.3 F (36.8 C) Temp src: Oral Pulse Rate: 85 Resp: 18 BP: 121/78 mmHg Patient Position, if appropriate: Lying Oxygen Therapy SpO2: 99 % O2 Device: None (Room air) Pain Pain Assessment Pain Assessment: No/denies pain Pain Score: 0-No pain Home Living/Prior Functioning Home Living Lives With: Son;Daughter Available Help at Discharge: Family Type of Home: House Home Access: Stairs to enter Secretary/administrator of Steps: 2 Entrance Stairs-Rails: None Home Layout: One level Home Adaptive Equipment: Hospital bed;Walker - rolling;Wheelchair - manual Prior Function Level of Independence: Requires assistive device for independence Able to Take Stairs?: No Driving: No Vocation: Retired Optometrist - History Baseline Vision: Legally blind Patient Visual Report: No change from baseline  Cognition Overall Cognitive Status: History of cognitive impairments - at baseline Arousal/Alertness: Awake/alert Orientation Level: Oriented to person;Oriented to place;Oriented to situation;Disoriented to time Safety/Judgment: Impaired Comments: requires cueing to push up from wheelchair and reach back for surface with ggoing from stand to sit Sensation Sensation Light Touch: Appears Intact Stereognosis: Not tested Hot/Cold: Not tested Proprioception: Not tested Coordination Gross Motor Movements are Fluid and  Coordinated: No Fine Motor Movements are Fluid and  Coordinated: No Coordination and Movement Description: patient demonstrated decreased coordination with left UE with quick pronation/supination and with LE for heel to shin and toe tapping Finger Nose Finger Test: patient overshoots with left UE Motor  Motor Motor: Hemiplegia  Mobility Bed Mobility Bed Mobility: Supine to Sit;Sit to Supine;Sitting - Scoot to Edge of Bed;Scooting to HOB Supine to Sit: 5: Supervision;HOB elevated;With rails Sitting - Scoot to Edge of Bed: 5: Supervision Sit to Supine: 5: Supervision Scooting to [facility]: 5: Supervision;With rail Transfers Sit to Stand: 4: Min assist;With upper extremity assist;From bed;From chair/3-in-1;With armrests Stand to Sit: 4: Min assist Stand to Sit Details: patient requires cueing to push up from wheelchair and to reach back prior to sitting to control descent; patient tends to "fall" into chair Locomotion  Ambulation Ambulation: Yes Ambulation/Gait Assistance: 3: Mod assist Ambulation Distance (Feet): 85 Feet Assistive device: Rolling walker Gait Gait: Yes Gait Pattern: Impaired Gait Pattern: Step-to pattern;Decreased step length - left;Narrow base of support;Trunk flexed;Decreased dorsiflexion - left;Left circumduction Stairs / Additional Locomotion Stairs: Yes Stairs Assistance: 3: Mod assist Stair Management Technique: Two rails Number of Stairs: 5 Height of Stairs: 6 Wheelchair Mobility Distance: 50  Balance Balance Balance Assessed: Yes Standardized Balance Assessment Standardized Balance Assessment: Berg Balance Test Berg Balance Test Sit to Stand: Needs minimal aid to stand or to stabilize Standing Unsupported: Unable to stand 30 seconds unassisted Sitting with Back Unsupported but Feet Supported on Floor or Stool: Able to sit safely and securely 2 minutes Stand to Sit: Sits independently, has uncontrolled descent Transfers: Needs one person to assist Standing  Unsupported with Eyes Closed: Needs help to keep from falling Standing Ubsupported with Feet Together: Needs help to attain position and unable to hold for 15 seconds From Standing, Reach Forward with Outstretched Arm: Loses balance while trying/requires external support From Standing Position, Pick up Object from Floor: Unable to try/needs assist to keep balance From Standing Position, Turn to Look Behind Over each Shoulder: Needs assist to keep from losing balance and falling Turn 360 Degrees: Needs assistance while turning Standing Unsupported, Alternately Place Feet on Step/Stool: Needs assistance to keep from falling or unable to try Standing Unsupported, One Foot in Front: Loses balance while stepping or standing Standing on One Leg: Unable to try or needs assist to prevent fall Total Score: 7 Extremity Assessment  RUE Assessment RUE Assessment: Within Functional Limits LUE Assessment LUE Assessment: Within Functional Limits RLE Assessment RLE Assessment: Within Functional Limits LLE Assessment LLE Assessment: Exceptions to [facility] LLE Strength LLE Overall Strength: Deficits LLE Overall Strength Comments: LE grossly 3+/5 at hip; 4/5 knee flexion/extension; 3-/5 dorsiflexion  FIM:  FIM - Bed/Chair Transfer Bed/Chair Transfer Assistive Devices: Therapist, occupational: 5: Supine > Sit: Supervision (verbal cues/safety issues);5: Sit > Supine: Supervision (verbal cues/safety issues);4: Bed > Chair or W/C: Min A (steadying Pt. > 75%);3: Chair or W/C > Bed: Mod A (lift or lower assist) FIM - Locomotion: Wheelchair Distance: 50 Locomotion: Wheelchair: 4: Travels 150 ft or more: maneuvers on rugs and over door sillls with minimal assistance (Pt.>75%) FIM - Locomotion: Ambulation Locomotion: Ambulation Assistive Devices: Designer, industrial/product Ambulation/Gait Assistance: 3: Mod assist Locomotion: Ambulation: 2: Travels 50 - 149 ft with moderate assistance (Pt: 50 - 74%) FIM - Locomotion:  Stairs Locomotion: Building control surveyor: Hand rail - 2 Locomotion: Stairs: 2: Up and Down 4 - 11 stairs with moderate assistance (Pt: 50 - 74%)   Refer to Care Plan for Long Term Goals  Recommendations  for other services: None  Discharge Criteria: Patient will be discharged from PT if patient refuses treatment 3 consecutive times without medical reason, if treatment goals not met, if there is a change in medical status, if patient makes no progress towards goals or if patient is discharged from hospital.  The above assessment, treatment plan, treatment alternatives and goals were discussed and mutually agreed upon: by patient  Alma Friendly 03/13/2013, 3:47 PM

## 2013-03-14 ENCOUNTER — Inpatient Hospital Stay (HOSPITAL_COMMUNITY): Payer: Medicare Other | Admitting: Physical Therapy

## 2013-03-14 ENCOUNTER — Inpatient Hospital Stay (HOSPITAL_COMMUNITY): Payer: Medicare Other | Admitting: Occupational Therapy

## 2013-03-14 ENCOUNTER — Inpatient Hospital Stay (HOSPITAL_COMMUNITY): Payer: Medicare Other

## 2013-03-14 DIAGNOSIS — R5381 Other malaise: Secondary | ICD-10-CM

## 2013-03-14 DIAGNOSIS — K750 Abscess of liver: Secondary | ICD-10-CM

## 2013-03-14 DIAGNOSIS — I69959 Hemiplegia and hemiparesis following unspecified cerebrovascular disease affecting unspecified side: Secondary | ICD-10-CM

## 2013-03-14 MED ORDER — IOHEXOL 300 MG/ML  SOLN
100.0000 mL | Freq: Once | INTRAMUSCULAR | Status: AC | PRN
  Administered 2013-03-14: 100 mL via INTRAVENOUS

## 2013-03-14 MED ORDER — DOXAZOSIN MESYLATE 2 MG PO TABS
2.0000 mg | ORAL_TABLET | Freq: Every day | ORAL | Status: DC
  Administered 2013-03-14 – 2013-03-15 (×2): 2 mg via ORAL
  Filled 2013-03-14 (×3): qty 1

## 2013-03-14 NOTE — Progress Notes (Signed)
Physical Therapy Note  Patient Details  Name: Brad Harrell MRN: [id number] Date of Birth: 1946-09-09 Today's Date: 03/14/2013  1300-1355 (55 minutes) individual Pain: no complaint of pain Focus of treatment: gait training/endurance; Therapeutic exercise focused on RT LE  Treatment: gait 80 feet RW min assist with decreased step length on right with intermittent decreased knee extension in stance; Nustep Level 4 X 10 minutes before c/o fatigue; RT LE - hip flexion 2 x 10 to 12 inch height; up/down 4 inch step RT Le with bilateral UE support X 8, X 6 before fatigue.   1445-1530 (45 minutes) individual Pain: no reported pain Focus of treatment: Bilateral LE strengthening; gait training Treatment: Transfers stand/turn RW SBA with vcs to stay within AD during transfer; supine ><sit (mat) SBA ; bilateral LE strengthening in supine X 20 - ankle pumps, SLRs with minimal quad lag on left; heel slides, SAQs , hip abduction; gait 80 feet RW min assist with improve step length on left this PM.    Sharna Gabrys,JIM 03/14/2013, 1:17 PM

## 2013-03-14 NOTE — Progress Notes (Signed)
Occupational Therapy Session Note  Patient Details  Name: Brad Harrell MRN: [id number] Date of Birth: 09-24-1946  Today's Date: 03/14/2013 Time: 0805-0905 and 0 min Time Calculation (min): 60 min; missed 30 min for 2nd session due to CT exam.  Attempted to see pt at 1145 but he was sleeping. Nursing reported that he had not slept all night and needed to rest.  Skilled Therapeutic Interventions/Progress Updates:    Visit 1:  No c/o pain. Pt seen for B/D at sink level with a focus on functional mobility of sit to stand skills and standing tolerance.  Min assist to sit EOB and ambulate with RW bed to chair.  Pt completed grooming with set up and bathing with close supervision. Pt able to don clothing with supervision but occasional min assist with sit to stand.  He needs mod cuing to push up from chair to stand and max cues to position body safely prior to sitting.  Ambulated into hallway for 25 feet and back to room with close supervision to steady assist. Pt was able to get into bed with supervision but then had watery loose stool in brief.  He ambulated to toilet with Rw and continued to use bathroom.  OT session time was completed so nurse techs entered room to assist pt off the toilet.  Daughter present for part of the session.  Visit 2:  Pt's second session scheduled at 10am, but pt taken down for CT exam.  Will attempt to reschedule. Visit 3:  Pt sound asleep, nursing reported he needed to rest as he had not slept during the night.  Therapy Documentation Precautions:  Precautions Precautions: Fall;Other (comment) (contact precautions) Precaution Comments: bulb drain on right side Restrictions Weight Bearing Restrictions: No Other Position/Activity Restrictions: full liquid diet    ADL:  See FIM for current functional status  Therapy/Group: Individual Therapy  Myrtha Tonkovich 03/14/2013, 9:41 AM

## 2013-03-14 NOTE — Progress Notes (Signed)
Nursing Note: Pt up to the bathroom and voided.A: scanned for 481 and in and out cathed by Caryn Bee ,nt for 450 cc.Pt tolerated well.wbb

## 2013-03-14 NOTE — Progress Notes (Signed)
Patient ID: Brad Harrell, male   DOB: 02-22-1946, 67 y.o.   MRN: [id number] 67 y.o. male with history of CVA with left hemiparesis, non-ischemic CM, h/o cocaine abuse, chronic foley for retention; admitted on 03/04/13 with complaints of abdominal pain, N/V, right facial weakness and lethargy. MRI with remote right thalamic infarct and right superior frontal infarct and no acute findings. CT abdomen with 7.6 cm liver abscess that was treated with drain placement by IVR. He was started on broad spectrum antibiotics and Dr. Ninetta Lights was consulted for input. 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 He felt that one blood culture positive for staph likely contaminant and UC with MRSA due to colonization. Antibiotics narrowed to ampicillin sulbactam. GI consulted for workup of iron deficiency anemia and due to concerns of occult liver lesion. EGD done revealing gastric erosions and non-bleeding upper duodenal AVMs. Colonoscopy done and polyp biopsied with recommendations to avoid anticoagulation by Dr. Elnoria Howard Subjective/Complaints: Hx urinary retention was cathed x 2 last noc 400 and Review of Systems  Gastrointestinal: Positive for abdominal pain. Negative for nausea and vomiting.  All other systems reviewed and are negative.   Objective: Vital Signs: Blood pressure 138/74, pulse 79, temperature 98.5 F (36.9 C), temperature source Oral, resp. rate 20, weight 84.6 kg (186 lb 8.2 oz), SpO2 99.00%. Ct Abdomen Pelvis W Contrast  03/12/2013   *RADIOLOGY REPORT*  Clinical Data: Follow-up liver abscess.  CT ABDOMEN AND PELVIS WITH CONTRAST  Technique:  Multidetector CT imaging of the abdomen and pelvis was performed following the standard protocol during bolus administration of intravenous contrast.  Contrast: OMNIPAQUE IOHEXOL 300 MG/ML  SOLN 03/04/2013  Comparison: 03/04/2013  Findings: Pigtail drainage catheter is noted within the large abscess in the left hepatic lobe.   This is decreased significantly in size.  Near complete resolution of the fluid component.  There continues to be an area of hypodensity surrounding the drainage catheter within the liver, likely inflammatory process.  This area measures 5.9 cm.  When measuring the cystic component and solid component previously, the entire area measured up to 9.6 cm.  There is a small amount of fluid likely in a subcapsular location along the anterior surface of the liver into the area is.  These are new since prior study.  The largest area is noted inferiorly and measures up to 2.0 cm.  Gallbladder, spleen, pancreas, adrenals and kidneys are normal. Small effusions in the lungs bilaterally.  Minimal bibasilar atelectasis dependently.  Heart is mildly enlarged.  Large and small bowel grossly unremarkable.  Aorta and iliac vessels are heavily calcified, non-aneurysmal.  Foley catheters present in the bladder.  Trace free fluid in the pelvis.  IMPRESSION: Near complete resolution of the fluid component of the large left hepatic abscess.  Solid hypodense component remains, likely inflammation within the hepatic parenchyma.  There are to new small subcapsular fluid collections noted along the surface of the liver, the largest 2 cm.  Small bilateral pleural effusions.  Dependent atelectasis.   Original Report Authenticated By: Charlett Nose, M.D.   Results for orders placed during the hospital encounter of 03/12/13 (from the past 72 hour(s))  CREATININE, SERUM     Status: Abnormal   Collection Time    03/12/13  8:19 PM      Result Value Range   Creatinine, Ser 0.82  0.50 - 1.35 mg/dL   GFR calc non Af Amer 88 (*) >90 mL/min   GFR calc Af Amer >90  >90  mL/min   Comment:            The eGFR has been calculated     using the CKD EPI equation.     This calculation has not been     validated in all clinical     situations.     eGFR's persistently     <90 mL/min signify     possible Chronic Kidney Disease.  CBC WITH  DIFFERENTIAL     Status: Abnormal   Collection Time    03/13/13  5:50 AM      Result Value Range   WBC 6.1  4.0 - 10.5 K/uL   RBC 3.55 (*) 4.22 - 5.81 MIL/uL   Hemoglobin 9.5 (*) 13.0 - 17.0 g/dL   HCT 40.9 (*) 81.1 - 91.4 %   MCV 83.1  78.0 - 100.0 fL   MCH 26.8  26.0 - 34.0 pg   MCHC 32.2  30.0 - 36.0 g/dL   RDW 78.2 (*) 95.6 - 21.3 %   Platelets 236  150 - 400 K/uL   Neutrophils Relative % 63  43 - 77 %   Neutro Abs 3.9  1.7 - 7.7 K/uL   Lymphocytes Relative 27  12 - 46 %   Lymphs Abs 1.6  0.7 - 4.0 K/uL   Monocytes Relative 8  3 - 12 %   Monocytes Absolute 0.5  0.1 - 1.0 K/uL   Eosinophils Relative 2  0 - 5 %   Eosinophils Absolute 0.1  0.0 - 0.7 K/uL   Basophils Relative 0  0 - 1 %   Basophils Absolute 0.0  0.0 - 0.1 K/uL  COMPREHENSIVE METABOLIC PANEL     Status: Abnormal   Collection Time    03/13/13  5:50 AM      Result Value Range   Sodium 138  135 - 145 mEq/L   Potassium 3.8  3.5 - 5.1 mEq/L   Chloride 104  96 - 112 mEq/L   CO2 23  19 - 32 mEq/L   Glucose, Bld 93  70 - 99 mg/dL   BUN 6  6 - 23 mg/dL   Creatinine, Ser 0.86  0.50 - 1.35 mg/dL   Calcium 8.8  8.4 - 57.8 mg/dL   Total Protein 6.8  6.0 - 8.3 g/dL   Albumin 1.9 (*) 3.5 - 5.2 g/dL   AST 30  0 - 37 U/L   ALT 22  0 - 53 U/L   Alkaline Phosphatase 68  39 - 117 U/L   Total Bilirubin 0.3  0.3 - 1.2 mg/dL   GFR calc non Af Amer 86 (*) >90 mL/min   GFR calc Af Amer >90  >90 mL/min   Comment:            The eGFR has been calculated     using the CKD EPI equation.     This calculation has not been     validated in all clinical     situations.     eGFR's persistently     <90 mL/min signify     possible Chronic Kidney Disease.     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 no acute distress   Assessment/Plan: 1. Functional deficits secondary to Deconditioning from liver abscess superimposed on chronic left hemiparesis from CVA which require 3+ hours per day of interdisciplinary therapy in a comprehensive inpatient rehab setting. Physiatrist is providing close team supervision and 24 hour management of active medical problems listed below. Physiatrist and rehab team continue to assess barriers to discharge/monitor patient progress toward functional and medical goals. FIM: FIM - Bathing Bathing Steps Patient Completed: Chest;Right Arm;Left Arm;Abdomen;Front perineal area;Buttocks;Right upper leg;Right lower leg (including foot);Left upper leg;Left lower leg (including foot) Bathing: 4: Steadying assist  FIM - Upper Body Dressing/Undressing Upper body dressing/undressing steps patient completed: Thread/unthread right sleeve of pullover shirt/dresss;Thread/unthread left sleeve of pullover shirt/dress;Put head through opening of pull over shirt/dress;Pull shirt over trunk Upper body dressing/undressing: 5: Set-up assist to: Obtain clothing/put away FIM - Lower Body Dressing/Undressing Lower body dressing/undressing steps patient completed: Thread/unthread right pants leg;Pull pants up/down;Fasten/unfasten pants;Don/Doff right sock;Don/Doff left sock Lower body dressing/undressing: 4: Min-Patient completed 75 plus % of tasks  FIM - Toileting Toileting steps completed by patient: Adjust clothing prior to toileting;Performs perineal hygiene;Adjust clothing after toileting Toileting: 4: Steadying assist  FIM - Diplomatic Services operational officer Devices: Art gallery manager Transfers: 4-To toilet/BSC: Min A (steadying Pt. > 75%);4-From toilet/BSC: Min A (steadying Pt. > 75%)  FIM - Bed/Chair Transfer Bed/Chair Transfer Assistive Devices: Therapist, occupational: 5: Supine  > Sit: Supervision (verbal cues/safety issues);5: Sit > Supine: Supervision (verbal cues/safety issues);4: Bed > Chair or W/C: Min A (steadying Pt. > 75%);3: Chair or W/C > Bed: Mod A (lift or lower assist)  FIM - Locomotion: Wheelchair Distance: 50 Locomotion: Wheelchair: 4: Travels 150 ft or more: maneuvers on rugs and over door sillls with minimal assistance (Pt.>75%) FIM - Locomotion: Ambulation Locomotion: Ambulation Assistive Devices: Designer, industrial/product Ambulation/Gait Assistance: 3: Mod assist Locomotion: Ambulation: 2: Travels 50 - 149 ft with moderate assistance (Pt: 50 - 74%)  Comprehension Comprehension Mode: Auditory Comprehension: 5-Understands complex 90% of the time/Cues < 10% of the time  Expression Expression Mode: Verbal Expression: 5-Expresses basic needs/ideas: With no assist  Social Interaction Social Interaction: 5-Interacts appropriately 90% of the time - Needs monitoring or encouragement for participation or interaction.  Problem Solving Problem Solving: 3-Solves basic 50 - 74% of the time/requires cueing 25 - 49% of the time  Memory Memory: 2-Recognizes or recalls 25 - 49% of the time/requires cueing 51 - 75% of the time  Medical Problem List and Plan:  1. DVT Prophylaxis/Anticoagulation: Pharmaceutical: Lovenox  2. Pain Management: N/A  3. Mood:Will monitor and provide ego support. LCSW to follow along.  4. Neuropsych: This patient is capable of making decisions on his own behalf.  5. Liver abscess: Continue unasyn D #10/21. Follow up CT abdomen today. IR to decide about removing abdominal drain.  6. CM: continue coreg bid.  7.  Urinary retention, on Cardura at home will resume at lower dose and monitor BP    LOS (Days) 2 A FACE TO FACE EVALUATION WAS PERFORMED  Renold Kozar E 03/14/2013, 6:57 AM

## 2013-03-14 NOTE — Progress Notes (Signed)
Subjective: Liver abscess drain placed 5/7 Pt feeling pretty good. Saw pt in gym at rehab.   Objective: Vital signs in last 24 hours: Temp:  [98.3 F (36.8 C)-98.5 F (36.9 C)] 98.5 F (36.9 C) (05/16 0504) Pulse Rate:  [79-85] 82 (05/16 0751) Resp:  [18-20] 20 (05/16 0504) BP: (121-144)/(74-82) 144/82 mmHg (05/16 0751) SpO2:  [96 %-99 %] 96 % (05/16 0750) Last BM Date: 03/14/13  PE:  Site of drain clean and dry; not tender No bleeding Output scant, serosanguinous, no purulence   Lab Results:   Recent Labs  03/13/13 0550  WBC 6.1  HGB 9.5*  HCT 29.5*  PLT 236   BMET  Recent Labs  03/12/13 2019 03/13/13 0550  NA  --  138  K  --  3.8  CL  --  104  CO2  --  23  GLUCOSE  --  93  BUN  --  6  CREATININE 0.82 0.88  CALCIUM  --  8.8     Assessment/Plan: Liver abscess drain placed 5/7 Doing well Repeat CT again todayshows near complete resolution of liver abscess but still significant inflammatory changes. Two small subcapsular fluid collections unchanged. Reviewed imaging with Dr. Bonnielee Haff, who feels we should leave drain in position and monitor. Repeat CT end of next week and if stable/improved, remove drain then. Pt agreeable to this plan.   LOS: 2 days    Brayton El 03/14/2013

## 2013-03-14 NOTE — Progress Notes (Signed)
Nursing Note: Pt unable to void. Scanned for 600 cc and in and out cathed for 600 cc . Pt tolerated well.wbb

## 2013-03-14 NOTE — Progress Notes (Signed)
Pt has not voided piror to 0700, bladder scan for 343cc at 11:23. Pt reports no urgency to void during this time, attempted to straight cath pt at 11.57 but pt refused, pt denies pain or discomfort, abd is not distended. In and out pt at 1430 for 375cc. Urine amber colored, clear noncloudy, no odor noticed. Specimen sent to lab for UA.  Pt tolerated procedure well, continue care at this time. RN A. Alona Bene notified.     Kelvin Cellar

## 2013-03-15 ENCOUNTER — Inpatient Hospital Stay (HOSPITAL_COMMUNITY): Payer: Medicare Other | Admitting: Occupational Therapy

## 2013-03-15 ENCOUNTER — Inpatient Hospital Stay (HOSPITAL_COMMUNITY): Payer: Medicare Other

## 2013-03-15 ENCOUNTER — Inpatient Hospital Stay (HOSPITAL_COMMUNITY): Payer: Medicare Other | Admitting: Physical Therapy

## 2013-03-15 DIAGNOSIS — K750 Abscess of liver: Secondary | ICD-10-CM

## 2013-03-15 DIAGNOSIS — R5381 Other malaise: Secondary | ICD-10-CM

## 2013-03-15 DIAGNOSIS — I69959 Hemiplegia and hemiparesis following unspecified cerebrovascular disease affecting unspecified side: Secondary | ICD-10-CM

## 2013-03-15 LAB — URINE CULTURE: Culture: NO GROWTH

## 2013-03-15 NOTE — Progress Notes (Signed)
Physical Therapy Session Note  Patient Details  Name: Brad Harrell MRN: [id number] Date of Birth: 02/10/1946  Today's Date: 03/15/2013 Time: 1130-1200 Time Calculation (min): 30 min  Short Term Goals: Week 1:  PT Short Term Goal 1 (Week 1): STG=LTG due to short LOS  Therapy Documentation Precautions:  Precautions: Fall;Other (comment) (contact precautions) Precaution Comments: bulb drain on right side Restrictions Weight Bearing Restrictions: No Other Position/Activity Restrictions: full liquid diet Pain: No/denies pain  Gait Training:(15')Using RW 2 x 150' with S/Mod-A with patient showing Left hip weakness  Therapeutic Exercise:(15') B LE's in sitting   Therapy/Group: Individual Therapy  Rex Kras 03/15/2013, 12:50 PM

## 2013-03-15 NOTE — Progress Notes (Signed)
Occupational Therapy Session Note  Patient Details  Name: ADELAIDO NICKLAUS MRN: [id number] Date of Birth: 1946-08-29  Today's Date: 03/15/2013 Time: 1030-1126 Time Calculation (min): 56 min  Short Term Goals: Week 1:     Skilled Therapeutic Interventions: Therapeutic activities with emphasis on dynamic sitting/standing balance, functional mobility using RW, L-UE NMR, endurance and strengthening of UE using theraband.   Patient deferred bathing/dressing activity scheduled due to having already completed ADL earlier.   Patient agreeable to dynamic sitting balance and NMR of L-UE activity of donning and fastening his shoes, followed with dynamic standing/mobility activity using hall railing with emphasis on side-stepping (to the left then to the right, approx 25'), and ending with therex using weighted rubber ball and theraband (orange).   Patient requires vc to avoid hitting obstacles mounted on the wall with his head due to visual impairment (blind in right eye) as well as redirection to face the wall and step as instructed, with his feet consistently positioned perpendicular to the wall.   Patient able to complete therex using L-UE although impaired by mild ataxia and left shoulder pain during shoulder flexion greater than 90 deg.    Therapy Documentation Precautions:  Precautions Precautions: Fall;Other (comment) (contact precautions) Precaution Comments: bulb drain on right side Restrictions Weight Bearing Restrictions: No Other Position/Activity Restrictions: full liquid diet  Pain: Pain Assessment Pain Assessment: No/denies pain  See FIM for current functional status  Therapy/Group: Individual Therapy  Georgeanne Nim 03/15/2013, 11:26 AM

## 2013-03-15 NOTE — Progress Notes (Signed)
Physical Therapy Session Note  Patient Details  Name: Brad Harrell MRN: [id number] Date of Birth: Sep 03, 1946  Today's Date: 03/15/2013 Time: 0900-1000 Time Calculation (min): 60 min  Short Term Goals: Week 1:  PT Short Term Goal 1 (Week 1): STG=LTG due to short LOS   Therapy Documentation Precautions:  Precautions Precautions: Fall;Other (comment) (contact precautions) Precaution Comments: bulb drain on right side Restrictions Weight Bearing Restrictions: No Other Position/Activity Restrictions: full liquid diet Pain: denies pain  Therapeutic Activity:(15')  bed mobility S/Mod-I for rolling right and left and Mod-A for scooting to [facility] and transfers sit<->stand with S/min-A  Gait Training:(15') using RW 2 x 80' with S/min-A Therapeutic Exercise:(30') Nu-Step x 10' with multiple rest breaks, seated B LE's with manual resistance.   Therapy/Group: Individual Therapy  Mikaella Escalona J 03/15/2013, 9:04 AM

## 2013-03-15 NOTE — Progress Notes (Signed)
Nursing Note: Pt scanned for 399 cc.Pt encouraged to get up to bathroom  And pt refused. A: In and out cathed for 300 cc by Caryn Bee ,nt.Pt tolerated well.wbb

## 2013-03-15 NOTE — Progress Notes (Signed)
Patient ID: Brad Harrell, male   DOB: 1946/02/23, 67 y.o.   MRN: [id number] 67 y.o. male with history of CVA with left hemiparesis, non-ischemic CM, h/o cocaine abuse, chronic foley for retention; admitted on 03/04/13 with complaints of abdominal pain, N/V, right facial weakness and lethargy. MRI with remote right thalamic infarct and right superior frontal infarct and no acute findings. CT abdomen with 7.6 cm liver abscess that was treated with drain placement by IVR. He was started on broad spectrum antibiotics and Dr. Ninetta Lights was consulted for input. 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 He felt that one blood culture positive for staph likely contaminant and UC with MRSA due to colonization. Antibiotics narrowed to ampicillin sulbactam. GI consulted for workup of iron deficiency anemia and due to concerns of occult liver lesion. EGD done revealing gastric erosions and non-bleeding upper duodenal AVMs. Colonoscopy done and polyp biopsied with recommendations to avoid anticoagulation by Dr. Elnoria Howard   Subjective/Complaints: Still some urine retention , 300-400 cc Review of Systems  Gastrointestinal: .  All other systems reviewed and are negative.   Objective: Vital Signs: Blood pressure 130/72, pulse 75, temperature 98.3 F (36.8 C), temperature source Oral, resp. rate 18, weight 84.6 kg (186 lb 8.2 oz), SpO2 98.00%. Ct Abdomen W Contrast  03/14/2013   *RADIOLOGY REPORT*  Clinical Data: Liver abscess  CT ABDOMEN WITH CONTRAST  Technique:  Multidetector CT imaging of the abdomen was performed following the standard protocol during bolus administration of intravenous contrast.  Contrast: OMNIPAQUE IOHEXOL 300 MG/ML  SOLN  Comparison: 03/12/2013  Findings: The pigtail drain remains coiled within the liver abscess.  There is a small fluid component is well as a soft tissue density throughout the large area of the inflammatory process.  The small subcapsular fluid  collections anterior to the lateral segment of the left lobe of the liver are stable.  The drain is stable in position.  Bilateral small pleural effusions are not significantly changed.  Stable appearance of the pancreas, spleen, adrenal glands, kidneys.  Stable atherosclerotic changes of the aorta.  IMPRESSION: Stable appearance of the liver abscess and inflammatory process. There are two small stable subcapsular fluid collections.   Original Report Authenticated By: Jolaine Click, M.D.   Results for orders placed during the hospital encounter of 03/12/13 (from the past 72 hour(s))  CREATININE, SERUM     Status: Abnormal   Collection Time    03/12/13  8:19 PM      Result Value Range   Creatinine, Ser 0.82  0.50 - 1.35 mg/dL   GFR calc non Af Amer 88 (*) >90 mL/min   GFR calc Af Amer >90  >90 mL/min   Comment:            The eGFR has been calculated     using the CKD EPI equation.     This calculation has not been     validated in all clinical     situations.     eGFR's persistently     <90 mL/min signify     possible Chronic Kidney Disease.  CBC WITH DIFFERENTIAL     Status: Abnormal   Collection Time    03/13/13  5:50 AM      Result Value Range   WBC 6.1  4.0 - 10.5 K/uL   RBC 3.55 (*) 4.22 - 5.81 MIL/uL   Hemoglobin 9.5 (*) 13.0 - 17.0 g/dL   HCT 47.4 (*) 25.9 - 56.3 %   MCV  83.1  78.0 - 100.0 fL   MCH 26.8  26.0 - 34.0 pg   MCHC 32.2  30.0 - 36.0 g/dL   RDW 16.1 (*) 09.6 - 04.5 %   Platelets 236  150 - 400 K/uL   Neutrophils Relative % 63  43 - 77 %   Neutro Abs 3.9  1.7 - 7.7 K/uL   Lymphocytes Relative 27  12 - 46 %   Lymphs Abs 1.6  0.7 - 4.0 K/uL   Monocytes Relative 8  3 - 12 %   Monocytes Absolute 0.5  0.1 - 1.0 K/uL   Eosinophils Relative 2  0 - 5 %   Eosinophils Absolute 0.1  0.0 - 0.7 K/uL   Basophils Relative 0  0 - 1 %   Basophils Absolute 0.0  0.0 - 0.1 K/uL  COMPREHENSIVE METABOLIC PANEL     Status: Abnormal   Collection Time    03/13/13  5:50 AM       Result Value Range   Sodium 138  135 - 145 mEq/L   Potassium 3.8  3.5 - 5.1 mEq/L   Chloride 104  96 - 112 mEq/L   CO2 23  19 - 32 mEq/L   Glucose, Bld 93  70 - 99 mg/dL   BUN 6  6 - 23 mg/dL   Creatinine, Ser 4.09  0.50 - 1.35 mg/dL   Calcium 8.8  8.4 - 81.1 mg/dL   Total Protein 6.8  6.0 - 8.3 g/dL   Albumin 1.9 (*) 3.5 - 5.2 g/dL   AST 30  0 - 37 U/L   ALT 22  0 - 53 U/L   Alkaline Phosphatase 68  39 - 117 U/L   Total Bilirubin 0.3  0.3 - 1.2 mg/dL   GFR calc non Af Amer 86 (*) >90 mL/min   GFR calc Af Amer >90  >90 mL/min   Comment:            The eGFR has been calculated     using the CKD EPI equation.     This calculation has not been     validated in all clinical     situations.     eGFR's persistently     <90 mL/min signify     possible Chronic Kidney Disease.     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 no acute distress   Assessment/Plan: 1. Functional deficits secondary to Deconditioning from liver abscess superimposed on chronic left hemiparesis from CVA which require 3+ hours per day of interdisciplinary therapy in a comprehensive inpatient rehab setting. Physiatrist is providing close team supervision and 24 hour management of active medical problems listed below. Physiatrist and rehab team continue to assess barriers to discharge/monitor patient progress toward functional and medical goals. FIM: FIM - Bathing Bathing Steps Patient Completed: Chest;Right Arm;Left Arm;Abdomen;Front perineal area;Buttocks;Right upper leg;Right lower leg (including foot);Left upper leg;Left lower leg (including foot) Bathing: 5: Supervision: Safety issues/verbal  cues  FIM - Upper Body Dressing/Undressing Upper body dressing/undressing steps patient completed: Thread/unthread right sleeve of pullover shirt/dresss;Thread/unthread left sleeve of pullover shirt/dress;Put head through opening of pull over shirt/dress;Pull shirt over trunk Upper body dressing/undressing: 5:  Set-up assist to: Obtain clothing/put away FIM - Lower Body Dressing/Undressing Lower body dressing/undressing steps patient completed: Thread/unthread right pants leg;Thread/unthread left pants leg;Pull pants up/down;Don/Doff right sock;Don/Doff left sock Lower body dressing/undressing: 5: Supervision: Safety issues/verbal cues  FIM - Toileting Toileting steps completed by patient: Adjust clothing prior to toileting;Performs perineal hygiene Toileting: 3: Mod-Patient completed 2 of 3 steps  FIM - Archivist Transfers Assistive Devices: Art gallery manager Transfers: 5-To toilet/BSC: Supervision (verbal cues/safety issues);5-From toilet/BSC: Supervision (verbal cues/safety issues)  FIM - Banker Devices: Therapist, occupational: 5: Supine > Sit: Supervision (verbal cues/safety issues);5: Sit > Supine: Supervision (verbal cues/safety issues);4: Bed > Chair or W/C: Min A (steadying Pt. > 75%);3: Chair or W/C > Bed: Mod A (lift or lower assist)  FIM - Locomotion: Wheelchair Distance: 50 Locomotion: Wheelchair: 4: Travels 150 ft or more: maneuvers on rugs and over door sillls with minimal assistance (Pt.>75%) FIM - Locomotion: Ambulation Locomotion: Ambulation Assistive Devices: Designer, industrial/product Ambulation/Gait Assistance: 3: Mod assist Locomotion: Ambulation: 2: Travels 50 - 149 ft with moderate assistance (Pt: 50 - 74%)  Comprehension Comprehension Mode: Auditory Comprehension: 5-Follows basic conversation/direction: With no assist  Expression Expression Mode: Verbal Expression: 5-Expresses basic needs/ideas: With extra  time/assistive device  Social Interaction Social Interaction: 5-Interacts appropriately 90% of the time - Needs monitoring or encouragement for participation or interaction.  Problem Solving Problem Solving: 3-Solves basic 50 - 74% of the time/requires cueing 25 - 49% of the time  Memory Memory: 2-Recognizes or recalls 25 - 49% of the time/requires cueing 51 - 75% of the time  Medical Problem List and Plan:  1. DVT Prophylaxis/Anticoagulation: Pharmaceutical: Lovenox  2. Pain Management: N/A  3. Mood:Will monitor and provide ego support. LCSW to follow along.  4. Neuropsych: This patient is capable of making decisions on his own behalf.  5. Liver abscess: Continue unasyn D #10/21. Minimal drain outpt.  -plan appears to be to leave drain in until end of next week 6. CM: continue coreg bid.  7.  Urinary retention, on Cardura at home will resume at lower dose and monitor BP  -urine culture pending  -discussed OOB to void, double voids etc    LOS (Days) 3 A FACE TO FACE EVALUATION WAS PERFORMED  SWARTZ,ZACHARY T 03/15/2013, 8:29 AM

## 2013-03-15 NOTE — Progress Notes (Signed)
Occupational Therapy Session Note  Patient Details  Name: Brad Harrell MRN: [id number] Date of Birth: 15-Oct-1946  Today's Date: 03/15/2013 Time: 1405-1450 Time Calculation (min): 45 min   Skilled Therapeutic Interventions/Progress Updates:    Pt seen for balance, LUE coordination and strengthening exercises and activity tolerance exercises. Focused session on exercises that he could complete at home with his cane. Pt worked on B overhead reaches, chest press, bicep curls, arm circles. Pt had great difficulty with some movements as he has weakness in LUE, but has difficulty seeing RUE with decreased right eye vision. With practice, he should be able to do these exercises at home.  Pt also worked on chair pushups, semisquats, standing balance exercises with overhead reaching, and marching in place. He did need several rest breaks.  At end of session pt requested to go back to bed.  Pt positioned himself in bed. Call light in place.   Therapy Documentation Precautions:  Precautions Precautions: Fall;Other (comment) (contact precautions) Precaution Comments: bulb drain on right side Restrictions Weight Bearing Restrictions: No Other Position/Activity Restrictions: full liquid diet  Pain: Pain Assessment Pain Assessment: No/denies pain  See FIM for current functional status  Therapy/Group: Individual Therapy  Chace Bisch 03/15/2013, 4:41 PM

## 2013-03-16 ENCOUNTER — Inpatient Hospital Stay (HOSPITAL_COMMUNITY): Payer: Medicare Other | Admitting: Physical Therapy

## 2013-03-16 MED ORDER — DOXAZOSIN MESYLATE 4 MG PO TABS
4.0000 mg | ORAL_TABLET | Freq: Every day | ORAL | Status: DC
  Administered 2013-03-16 – 2013-03-20 (×5): 4 mg via ORAL
  Filled 2013-03-16 (×6): qty 1

## 2013-03-16 NOTE — Progress Notes (Signed)
Patient ID: Brad Harrell, male   DOB: [birth year], 67 y.o.   MRN: [id number] 67 y.o. male with history of CVA with left hemiparesis, non-ischemic CM, h/o cocaine abuse, chronic foley for retention; admitted on 03/04/13 with complaints of abdominal pain, N/V, right facial weakness and lethargy. MRI with remote right thalamic infarct and right superior frontal infarct and no acute findings. CT abdomen with 7.6 cm liver abscess that was treated with drain placement by IVR. He was started on broad spectrum antibiotics and Dr. Ninetta Lights was consulted for input. 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 He felt that one blood culture positive for staph likely contaminant and UC with MRSA due to colonization. Antibiotics narrowed to ampicillin sulbactam. GI consulted for workup of iron deficiency anemia and due to concerns of occult liver lesion. EGD done revealing gastric erosions and non-bleeding upper duodenal AVMs. Colonoscopy done and polyp biopsied with recommendations to avoid anticoagulation by Dr. Elnoria Howard   Subjective/Complaints: Still retaining urine. No other complaints.  Review of Systems  Gastrointestinal: .  All other systems reviewed and are negative.   Objective: Vital Signs: Blood pressure 147/88, pulse 85, temperature 98.2 F (36.8 C), temperature source Oral, resp. rate 18, weight 84.6 kg (186 lb 8.2 oz), SpO2 99.00%. Ct Abdomen W Contrast  03/14/2013   *RADIOLOGY REPORT*  Clinical Data: Liver abscess  CT ABDOMEN WITH CONTRAST  Technique:  Multidetector CT imaging of the abdomen was performed following the standard protocol during bolus administration of intravenous contrast.  Contrast: OMNIPAQUE IOHEXOL 300 MG/ML  SOLN  Comparison: 03/12/2013  Findings: The pigtail drain remains coiled within the liver abscess.  There is a small fluid component is well as a soft tissue density throughout the large area of the inflammatory process.  The small subcapsular  fluid collections anterior to the lateral segment of the left lobe of the liver are stable.  The drain is stable in position.  Bilateral small pleural effusions are not significantly changed.  Stable appearance of the pancreas, spleen, adrenal glands, kidneys.  Stable atherosclerotic changes of the aorta.  IMPRESSION: Stable appearance of the liver abscess and inflammatory process. There are two small stable subcapsular fluid collections.   Original Report Authenticated By: Jolaine Click, M.D.   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 no acute distress   Assessment/Plan: 1. Functional deficits secondary to Deconditioning from liver abscess superimposed on chronic left hemiparesis from CVA which require 3+ hours per day of interdisciplinary therapy in a comprehensive inpatient rehab setting. Physiatrist is providing close team supervision and 24 hour management of active medical problems listed below. Physiatrist and rehab team  continue to assess barriers to discharge/monitor patient progress toward functional and medical goals. FIM: FIM - Bathing Bathing Steps Patient Completed: Chest;Right Arm;Left Arm;Abdomen;Front perineal area;Buttocks;Right upper leg;Right lower leg (including  foot);Left upper leg;Left lower leg (including foot) Bathing: 5: Supervision: Safety issues/verbal cues  FIM - Upper Body Dressing/Undressing Upper body dressing/undressing steps patient completed: Thread/unthread right sleeve of pullover shirt/dresss;Thread/unthread left sleeve of pullover shirt/dress;Put head through opening of pull over shirt/dress;Pull shirt over trunk Upper body dressing/undressing: 5: Set-up assist to: Obtain clothing/put away FIM - Lower Body Dressing/Undressing Lower body dressing/undressing steps patient completed: Thread/unthread right pants leg;Thread/unthread left pants leg;Pull pants up/down;Don/Doff right sock;Don/Doff left sock Lower body dressing/undressing: 5: Supervision: Safety issues/verbal cues  FIM - Toileting Toileting steps completed by patient: Adjust clothing prior to toileting;Performs perineal hygiene Toileting: 3: Mod-Patient completed 2 of 3 steps  FIM - Archivist Transfers Assistive Devices: Art gallery manager Transfers: 5-To toilet/BSC: Supervision (verbal cues/safety issues);5-From toilet/BSC: Supervision (verbal cues/safety issues)  FIM - Banker Devices: Therapist, occupational: 4: Chair or W/C > Bed: Min A (steadying Pt. > 75%);4: Bed > Chair or W/C: Min A (steadying Pt. > 75%)  FIM - Locomotion: Wheelchair Distance: 50 Locomotion: Wheelchair: 4: Travels 150 ft or more: maneuvers on rugs and over door sillls with minimal assistance (Pt.>75%) FIM - Locomotion: Ambulation Locomotion: Ambulation Assistive Devices: Designer, industrial/product Ambulation/Gait Assistance: 3: Mod assist Locomotion: Ambulation: 2: Travels 50 - 149 ft with moderate assistance (Pt: 50 - 74%)  Comprehension Comprehension Mode: Auditory Comprehension: 5-Understands basic 90% of the time/requires cueing < 10% of the time  Expression Expression Mode: Verbal Expression: 5-Expresses basic needs/ideas: With extra  time/assistive device  Social Interaction Social Interaction: 4-Interacts appropriately 75 - 89% of the time - Needs redirection for appropriate language or to initiate interaction.  Problem Solving Problem Solving: 3-Solves basic 50 - 74% of the time/requires cueing 25 - 49% of the time  Memory Memory: 3-Recognizes or recalls 50 - 74% of the time/requires cueing 25 - 49% of the time  Medical Problem List and Plan:  1. DVT Prophylaxis/Anticoagulation: Pharmaceutical: Lovenox  2. Pain Management: N/A  3. Mood:Will monitor and provide ego support. LCSW to follow along.  4. Neuropsych: This patient is capable of making decisions on his own behalf.  5. Liver abscess: Continue unasyn D #11/21. Minimal drain outpt.  -plan appears to be to leave drain in until end of next week 6. CM: continue coreg bid.  7.  Urinary retention, increase cardura to 4mg . bp's ok at present  -urine culture negative  -discussed OOB to void, double voids etc    LOS (Days) 4 A FACE TO FACE EVALUATION WAS PERFORMED  SWARTZ,ZACHARY T 03/16/2013, 8:37 AM

## 2013-03-16 NOTE — Progress Notes (Signed)
Physical Therapy Note  Patient Details  Name: KENDON SEDENO MRN: [id number] Date of Birth: 1946-04-22 Today's Date: 03/16/2013  8119-1478 (45 minutes) individual Pain: no reported pain Focus of treatment: Therapeutic exercise focused on activity tolerance; gait training/ endurance Treatment: supine to sit SBA using bedrail; transfer SBA RW ; Nustep Level 4 X 10 minutes; Gait 80 feet RW min assist with mod vcs to increased step length on left (50% foot drag). Pt can correct when cued but foot drag increases with fatigue and decreased attention to task; alternate stepping to 6 inch step with RT UE support . Pt continues to have difficulty stepping to 6 inch step on left with decreasing trunk extension and loss of balance. When attempting to sit, pt required max vcs for safe placement of AD before sitting.   Keltie Labell,JIM 03/16/2013, 10:55 AM

## 2013-03-16 NOTE — Progress Notes (Signed)
+/-   sleep last night. At 2230 incontinent of urine, I & O cath=735ml, also incontinent of stool at that time as well. At 0600, no void, I & O cath=646ml. Abdomen distended, non-tender,  bowel sounds positive X 4 quads. 10ml of serous sanquinous drainage in JP drain. Re-charged every 4 hours. Irrigated drain this AM with 5ml of NS with 4ml returned. Abdominal dressing C, D, & I.  Decreased initiation. Martina Sinner, RN

## 2013-03-17 ENCOUNTER — Inpatient Hospital Stay (HOSPITAL_COMMUNITY): Payer: Medicare Other

## 2013-03-17 ENCOUNTER — Inpatient Hospital Stay (HOSPITAL_COMMUNITY): Payer: Medicare Other | Admitting: Occupational Therapy

## 2013-03-17 ENCOUNTER — Inpatient Hospital Stay (HOSPITAL_COMMUNITY): Payer: Medicare Other | Admitting: *Deleted

## 2013-03-17 DIAGNOSIS — R5381 Other malaise: Secondary | ICD-10-CM

## 2013-03-17 DIAGNOSIS — I69959 Hemiplegia and hemiparesis following unspecified cerebrovascular disease affecting unspecified side: Secondary | ICD-10-CM

## 2013-03-17 DIAGNOSIS — K750 Abscess of liver: Secondary | ICD-10-CM

## 2013-03-17 LAB — COMPREHENSIVE METABOLIC PANEL
Albumin: 2.1 g/dL — ABNORMAL LOW (ref 3.5–5.2)
Alkaline Phosphatase: 63 U/L (ref 39–117)
BUN: 8 mg/dL (ref 6–23)
CO2: 24 mEq/L (ref 19–32)
Chloride: 104 mEq/L (ref 96–112)
Creatinine, Ser: 0.88 mg/dL (ref 0.50–1.35)
GFR calc Af Amer: >90 mL/min (ref >90)
GFR calc non Af Amer: 86 mL/min — ABNORMAL LOW (ref >90)
Glucose, Bld: 99 mg/dL (ref 70–99)
Potassium: 3.8 mEq/L (ref 3.5–5.1)
Total Bilirubin: 0.3 mg/dL (ref 0.3–1.2)

## 2013-03-17 NOTE — Progress Notes (Signed)
Complained of abdominal pain, distal to JP drain site, "dull" pain. I&O cath=300cc's. Threw up partially digested food. Paged Dr. Riley Kill with above info, instructed to monitor for now. PRN vicodin given at 2250 for abdominal pain. Refused scheduled, colace, senna s, and ensure. Brad Harrell A

## 2013-03-17 NOTE — Progress Notes (Signed)
Patient ID: Brad Harrell, male   DOB: Aug 28, 1946, 67 y.o.   MRN: [id number] 67 y.o. male with history of CVA with left hemiparesis, non-ischemic CM, h/o cocaine abuse, chronic foley for retention; admitted on 03/04/13 with complaints of abdominal pain, N/V, right facial weakness and lethargy. MRI with remote right thalamic infarct and right superior frontal infarct and no acute findings. CT abdomen with 7.6 cm liver abscess that was treated with drain placement by IVR. He was started on broad spectrum antibiotics and Dr. Ninetta Lights was consulted for input. 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 He felt that one blood culture positive for staph likely contaminant and UC with MRSA due to colonization. Antibiotics narrowed to ampicillin sulbactam. GI consulted for workup of iron deficiency anemia and due to concerns of occult liver lesion. EGD done revealing gastric erosions and non-bleeding upper duodenal AVMs. Colonoscopy done and polyp biopsied with recommendations to avoid anticoagulation by Dr. Elnoria Howard   Subjective/Complaints: Asking about drain No other complaints.  Review of Systems  Gastrointestinal: .  All other systems reviewed and are negative.   Objective: Vital Signs: Blood pressure 121/61, pulse 82, temperature 98.1 F (36.7 C), temperature source Oral, resp. rate 18, weight 84.6 kg (186 lb 8.2 oz), SpO2 96.00%. No results found. Results for orders placed during the hospital encounter of 03/12/13 (from the past 72 hour(s))  URINE CULTURE     Status: None   Collection Time    03/14/13  2:30 PM      Result Value Range   Specimen Description URINE, CATHETERIZED     Special Requests unasyn     Culture  Setup Time 03/14/2013 14:56     Colony Count NO GROWTH     Culture NO GROWTH     Report Status 03/15/2013 FINAL    COMPREHENSIVE METABOLIC PANEL     Status: Abnormal   Collection Time    03/17/13  5:35 AM      Result Value Range   Sodium 138  135 - 145  mEq/L   Potassium 3.8  3.5 - 5.1 mEq/L   Chloride 104  96 - 112 mEq/L   CO2 24  19 - 32 mEq/L   Glucose, Bld 99  70 - 99 mg/dL   BUN 8  6 - 23 mg/dL   Creatinine, Ser 4.78  0.50 - 1.35 mg/dL   Calcium 8.6  8.4 - 29.5 mg/dL   Total Protein 6.4  6.0 - 8.3 g/dL   Albumin 2.1 (*) 3.5 - 5.2 g/dL   AST 21  0 - 37 U/L   ALT 18  0 - 53 U/L   Alkaline Phosphatase 63  39 - 117 U/L   Total Bilirubin 0.3  0.3 - 1.2 mg/dL   GFR calc non Af Amer 86 (*) >90 mL/min   GFR calc Af Amer >90  >90 mL/min   Comment:            The eGFR has been calculated     using the CKD EPI equation.     This calculation has not been     validated in all clinical     situations.     eGFR's persistently     <90 mL/min signify     possible Chronic Kidney Disease.     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 no acute distress   Assessment/Plan: 1. Functional deficits secondary to Deconditioning from liver abscess superimposed on chronic left hemiparesis from CVA which require 3+ hours per day of interdisciplinary therapy in a comprehensive inpatient rehab setting. Physiatrist is providing close team supervision and 24 hour management of active medical problems listed below. Physiatrist and rehab team continue to assess barriers to discharge/monitor patient progress toward functional and medical goals. FIM: FIM - Bathing Bathing Steps Patient Completed: Chest;Right Arm;Left Arm;Abdomen;Front perineal area;Buttocks;Right upper leg;Right lower leg (including foot);Left upper leg;Left lower leg (including foot) Bathing: 5: Supervision: Safety issues/verbal cues  FIM - Upper Body  Dressing/Undressing Upper body dressing/undressing steps patient completed: Thread/unthread right sleeve of pullover shirt/dresss;Thread/unthread left sleeve of pullover shirt/dress;Put head through opening of pull over shirt/dress;Pull shirt over trunk Upper body dressing/undressing: 5: Set-up assist to: Obtain clothing/put away FIM - Lower Body Dressing/Undressing Lower body dressing/undressing steps patient completed: Thread/unthread right pants leg;Thread/unthread left pants leg;Pull pants up/down;Don/Doff right sock;Don/Doff left sock Lower body dressing/undressing: 5: Supervision: Safety issues/verbal cues  FIM - Toileting Toileting steps completed by patient: Adjust clothing prior to toileting;Performs perineal hygiene Toileting: 3: Mod-Patient completed 2 of 3 steps  FIM - Archivist Transfers Assistive Devices: Art gallery manager Transfers: 5-To toilet/BSC: Supervision (verbal cues/safety issues);5-From toilet/BSC: Supervision (verbal cues/safety issues)  FIM - Banker Devices: Therapist, occupational: 4: Chair or W/C > Bed: Min A (steadying Pt. > 75%);4: Bed > Chair or W/C: Min A (steadying Pt. > 75%)  FIM - Locomotion: Wheelchair Distance: 50 Locomotion: Wheelchair: 4: Travels 150 ft or more: maneuvers on rugs and over door sillls with minimal assistance (Pt.>75%) FIM - Locomotion: Ambulation Locomotion: Ambulation Assistive Devices: Designer, industrial/product Ambulation/Gait Assistance: 3: Mod assist Locomotion: Ambulation: 2: Travels 50 - 149 ft with moderate assistance (Pt: 50 - 74%)  Comprehension Comprehension Mode: Auditory Comprehension: 5-Understands basic 90% of the time/requires cueing < 10% of the time  Expression Expression Mode: Verbal Expression: 5-Expresses basic needs/ideas: With extra time/assistive device  Social Interaction Social Interaction: 4-Interacts appropriately 75 - 89% of the time - Needs redirection  for appropriate language or to initiate interaction.  Problem Solving Problem Solving: 3-Solves basic 50 - 74% of the time/requires cueing 25 - 49% of the time  Memory Memory: 3-Recognizes or recalls 50 - 74% of the time/requires cueing 25 - 49% of the time  Medical Problem List and Plan:  1. DVT Prophylaxis/Anticoagulation: Pharmaceutical: Lovenox  2. Pain Management: N/A  3. Mood:Will monitor and provide ego support. LCSW to follow along.  4. Neuropsych: This patient is capable of making decisions on his own behalf.  5. Liver abscess: Continue unasyn D #13/21. Minimal drain   -plan appears to be to leave drain in until end of this week 6. CM: continue coreg bid.  7.  Urinary retention, increase cardura to 4mg . bp's ok at present  -urine culture negative  -discussed OOB to void, double voids etc    LOS (Days) 5 A FACE TO FACE EVALUATION WAS PERFORMED  KIRSTEINS,ANDREW E 03/17/2013, 7:02 AM

## 2013-03-17 NOTE — Progress Notes (Signed)
Physical Therapy Session Note  Patient Details  Name: Brad Harrell MRN: [id number] Date of Birth: [birth year]  Today's Date: 03/17/2013 Time: 1000-1100 Time Calculation (min): 60 min  Short Term Goals: Week 1:  PT Short Term Goal 1 (Week 1): STG=LTG due to short LOS  Skilled Therapeutic Interventions/Progress Updates:  Treatment focused on neuromuscular re-education via demo, VCs, tactile cues for R and L stance stability, swing phase components, upright posture, forward gaze, = step lengths; also on instruction in self stretching bil hamstrings and heel cords in sitting, using belt.  Gait with RW x 90' with min assist, mod VCs on level tile, and up/down 5 steps with 2 rails, step through when ascending, step to when descending.  Fall prevention 1 exercises in standing with mod assist for balance: calf raises, mini squats in modified R and L tandem; in sitting: knee extension with isometric hold at end range.    Therapy Documentation Precautions:  Precautions Precautions: Fall;Other (comment) Precaution Comments: bulb drain on right side; blind R eye Restrictions Weight Bearing Restrictions: No Other Position/Activity Restrictions: full liquid diet   Pain: Pain Assessment Pain Assessment: No/denies pain   Locomotion : Ambulation Ambulation/Gait Assistance: 4: Min assist      Other Treatments: Treatments Neuromuscular Facilitation: Left;Lower Extremity;Forced use;Activity to increase timing and sequencing;Activity to increase motor control;Activity to increase grading;Activity to increase anterior-posterior weight shifting;Activity to increase lateral weight shifting  See FIM for current functional status  Therapy/Group: Individual Therapy  Rumi Taras 03/17/2013, 1:23 PM

## 2013-03-17 NOTE — Progress Notes (Signed)
  Subjective: Liver abscess drain  Placed 5/7 Output minimal CT 5/16 shows improvement; but inflam and 2 small fluid collections- subcapsular  Objective: Vital signs in last 24 hours: Temp:  [97.7 F (36.5 C)-98.4 F (36.9 C)] 98.1 F (36.7 C) (05/19 0548) Pulse Rate:  [72-82] 82 (05/19 0548) Resp:  [17-18] 18 (05/19 0548) BP: (121-158)/(61-83) 121/61 mmHg (05/19 0548) SpO2:  [96 %-100 %] 96 % (05/19 0548) Last BM Date: 03/16/13  Intake/Output from previous day: 05/18 0701 - 05/19 0700 In: 1085 [P.O.:1080] Out: 714.5 [Urine:700; Drains:14.5] Intake/Output this shift: Total I/O In: 360 [P.O.:360] Out: 5 [Drains:5]  PE: afeb; vss Looking well Site clean and dry NT; output 15 cc yesterday Bloody- minimal  Lab Results:  No results found for this basename: WBC, HGB, HCT, PLT,  in the last 72 hours BMET  Recent Labs  03/17/13 0535  NA 138  K 3.8  CL 104  CO2 24  GLUCOSE 99  BUN 8  CREATININE 0.88  CALCIUM 8.6   PT/INR No results found for this basename: LABPROT, INR,  in the last 72 hours ABG No results found for this basename: PHART, PCO2, PO2, HCO3,  in the last 72 hours  Studies/Results: No results found.  Anti-infectives: Anti-infectives   Start     Dose/Rate Route Frequency Ordered Stop   03/12/13 2330  Ampicillin-Sulbactam (UNASYN) 3 g in sodium chloride 0.9 % 100 mL IVPB     3 g 100 mL/hr over 60 Minutes Intravenous Every 6 hours 03/12/13 1829        Assessment/Plan: s/p Liver abscess drain placed 5/7  Keep drain for now Re CT maybe 7-10 days Plan per TRH   LOS: 5 days    Alma Muegge A 03/17/2013

## 2013-03-17 NOTE — Progress Notes (Signed)
Physical Therapy Note  Patient Details  Name: Brad Harrell MRN: [id number] Date of Birth: 07/11/1946 Today's Date: 03/17/2013  Patient missed 45 minutes of skilled physical therapy this PM secondary to refusal to participate. Patient in bed asleep, easily aroused. Patient initially refuses therapy several times. When asked if he needs to use the bathroom, patient states yes. Patient transfers supine>sit with supervision and then states he had already been incontinent. Offered to assist patient in cleaning up and patient repeatedly refuses and continues to refuse to get out of bed, requesting to have RN come to room. RN notified and aware. Will follow up as able.  Zella Richer Caulin Begley S. Juma Oxley, PT, DPT 03/17/2013, 4:10 PM

## 2013-03-17 NOTE — Progress Notes (Signed)
Occupational Therapy Session Note  Patient Details  Name: Brad Harrell MRN: [id number] Date of Birth: [birth year]  Today's Date: 03/17/2013 Time: 0800-0900 and 1100-1130 Time Calculation (min): 60 min  And 30 min  Short Term Goals: No short term goals set  Skilled Therapeutic Interventions/Progress Updates:    Visit 1:  No c/o pain. Pt seen for B/D at sink level with a focus on sit to stand. Standing balance and safe transfers.  Pt needed min cues to back up fully to wheelchair, but he was able to complete all B/D with supervision.  He then worked on functional transfers w/c to arm chair 6x with no verbal cuing needed to back up closely and to use his hands to reach back and push up.  He also worked on Corporate treasurer 10x3 and BUE AROM with dowel/cane for shoulder and chest presses. Pt stated the arm exercises made him tired and he requested to lay down prior to PT session.    Visit 2: No c/o pain.  Pt seen this session for activity tolerance, UE strengthening, and balance exercises. Pt stated that he was very tired from last session, but he was willing to continue the session. Pt worked on [employer] for 10 min with light resistance and then standing balance exercises of semi squats, toe and heel raises. Pt had difficulty with bilateral toe raises, so he did then unilaterally. Pt returned to room and pt got back in bed.  Therapy Documentation Precautions:  Precautions Precautions: Fall;Other (comment) (contact precautions) Precaution Comments: bulb drain on right side Restrictions Weight Bearing Restrictions: No Other Position/Activity Restrictions: full liquid diet  Pain: Pain Assessment Pain Assessment: No/denies pain ADL: See FIM for current functional status  Therapy/Group: Individual Therapy  SAGUIER,JULIA 03/17/2013, 9:17 AM

## 2013-03-18 ENCOUNTER — Inpatient Hospital Stay (HOSPITAL_COMMUNITY): Payer: Medicare Other

## 2013-03-18 ENCOUNTER — Inpatient Hospital Stay (HOSPITAL_COMMUNITY): Payer: Medicare Other | Admitting: Occupational Therapy

## 2013-03-18 MED ORDER — BOOST / RESOURCE BREEZE PO LIQD
1.0000 | ORAL | Status: DC
  Administered 2013-03-19 – 2013-03-21 (×3): 1 via ORAL

## 2013-03-18 NOTE — Progress Notes (Signed)
Occupational Therapy Session Note  Patient Details  Name: Brad Harrell MRN: [id number] Date of Birth: 04-Aug-1946  Today's Date: 03/18/2013 Time: 5284-1324 Time Calculation (min): 45 min  Short Term Goals=LTGs  Skilled Therapeutic Interventions/Progress Updates:  Patient propelled w/c forward and backward to therapy gym first using BUEs then just using BLEs.  Engaged in therapeutic activity while in to address activity tolerance, standing tolerance and dynamic standing balance with reaching right and left.  Patient required 3 rest breaks while propelling w/c and 3 breaks while performing standing tasks.  Assisted patient to lay down on mat in gym for UE exercises with 5# weighted bar and remained their for next scheduled therapy.  Therapy Documentation Precautions:  Precautions Precautions: Fall;Other (comment) Precaution Comments: bulb drain on right side; blind R eye Restrictions Weight Bearing Restrictions: No Other Position/Activity Restrictions: full liquid diet Pain: Denies pain  Therapy/Group: Individual Therapy  [name] 03/18/2013, 12:28 PM

## 2013-03-18 NOTE — Progress Notes (Signed)
Occupational Therapy Session Note  Patient Details  Name: Brad Harrell MRN: [id number] Date of Birth: 02/20/1946  Today's Date: 03/18/2013 Time: 0800-0900 Time Calculation (min): 60 min  Short Term Goals: No short term goals set  Skilled Therapeutic Interventions/Progress Updates:      Pt seen for BADL retraining o bathing and dressing with a focus on functional mobility and standing balance.  Pt's daughter present for session and educated her on patient's functional status and HEPs.  She stated that someone can be with patient at all times.  His environment is set up at home in a safe and adaptive technique and he has also received quite a bit of rehab for his previous 2 CVAs.  She stated she was pleased with his progress. I informed her and the pt that his LTGs were changed from mod I to supervision based on the fact that he is having quite a bit of urinary incontinence and he continues to fatigue quickly.  Overall, he is supervision with his self care.  OT is focusing on increasing his activity tolerance and standing balance during self care tasks.  After completion of self care, pt worked on semi squats at sink.  He did slightly better with trunk extension using mirror as a visual aid.  Also worked on heel and toe raises.  Pt resting in w/c at end of session with call light and phone in place.  Therapy Documentation Precautions:  Precautions Precautions: Fall;Other (comment) Precaution Comments: bulb drain on right side; blind R eye Restrictions Weight Bearing Restrictions: No Other Position/Activity Restrictions: full liquid diet  Pain: Pain Assessment Pain Assessment: No/denies pain ADL:  See FIM for current functional status  Therapy/Group: Individual Therapy  Jalene Demo 03/18/2013, 11:24 AM

## 2013-03-18 NOTE — Progress Notes (Signed)
Patient incontinent of large amt urine.  PVR scan is 357 ml.  Patient voided 150 ml in urinal with PVR results of 200 ml.

## 2013-03-18 NOTE — Progress Notes (Signed)
NUTRITION FOLLOW UP  Intervention:   1. Change Ensure Complete to Resource Breeze 2. RD to continue to follow nutrition care plan  Nutrition Dx:   Inadequate oral intake related to poor appetite as evidenced by variable meal intake. Improved.  Goal:   Intake to meet >90% of estimated nutrition needs.  Monitor:   weight trends, lab trends, I/O's, PO intake, supplement tolerance  Assessment:   Admitted with decreased appetite, malaise, abdominal pain, and possible R facial droop x 1 - 2 weeks. ER work-up reveals liver abscess. Underwent liver drain placement   Continues on Heart Healthy diet at this time. Consuming 75-100% of meals. Ordered for Ensure Complete with medications - refusing.  Height: Ht Readings from Last 1 Encounters:  03/12/13 5\' 4"  (1.626 m)    Weight Status:   Wt Readings from Last 1 Encounters:  03/12/13 186 lb 8.2 oz (84.6 kg)  No new wt  Re-estimated needs:  Kcal: 1700 - 1900 Protein: 75 - 90 grams Fluid: 1.7 - 1.9 liters daily  Skin: R abdominal incision  Diet Order: Cardiac   Intake/Output Summary (Last 24 hours) at 03/18/13 1226 Last data filed at 03/18/13 0906  Gross per 24 hour  Intake    840 ml  Output    260 ml  Net    580 ml    Last BM: 5/18   Labs:   Recent Labs Lab 03/12/13 2019 03/13/13 0550 03/17/13 0535  NA  --  138 138  K  --  3.8 3.8  CL  --  104 104  CO2  --  23 24  BUN  --  6 8  CREATININE 0.82 0.88 0.88  CALCIUM  --  8.8 8.6  GLUCOSE  --  93 99    CBG (last 3)  No results found for this basename: GLUCAP,  in the last 72 hours  Scheduled Meds: . ampicillin-sulbactam (UNASYN) IV  3 g Intravenous Q6H  . carvedilol  6.25 mg Oral BID WC  . docusate sodium  100 mg Oral BID  . doxazosin  4 mg Oral QHS  . enoxaparin (LOVENOX) injection  40 mg Subcutaneous Q24H  . feeding supplement  120 mL Oral QID  . multivitamin with minerals  1 tablet Oral Daily  . polyethylene glycol  17 g Oral Daily  . saccharomyces  boulardii  250 mg Oral BID  . senna  1 tablet Oral BID    Continuous Infusions:   Jarold Motto MS, RD, LDN Pager: [phone] After-hours pager: [phone]

## 2013-03-18 NOTE — Progress Notes (Signed)
Patient incontinent of urine in brief.  PVR scan result is 146 ml.

## 2013-03-18 NOTE — Progress Notes (Signed)
Physical Therapy Note  Patient Details  Name: Brad Harrell MRN: [id number] Date of Birth: 21-Feb-1946 Today's Date: 03/18/2013  10:30 - 11:30 60 minutes  Individual session Patient denies pain.  Treatment focused on LE strengthening, balance/ambulation, functional mobility in apartment/ home setting. Patient performed LE active exercises supine working on strengthening and control including: heel slides, hip abduction, SLR's, bridging x 10 - 12 reps each and LAQ's in sitting. Patient ambulated 110 feet with RW and close supervision. Patient tends to drag left LE and requires cueing to pick up left foot and take larger step. Patient ambulated up and down 10 steps with bilateral rails and min contact assist. Patient required cueing for sequencing on steps. Patient ambulated up and down curb, around obstacles and over objects with RW and close supervision. Patient practiced ambulating with RW on carpet in home like setting with close supervision. Patient performed bed mobility on regular double bed with supervision. Patient sit<> stand from bed and kitchen chair with supervision. Patient returned to room via wheelchair and transferred wheelchair to bed with supervision without assistive device. Patient sit to supine with supervision. Patient left resting in bed with items in reach.    1:40 - 2:10 30 minutes Individual session Patient denies pain.  Treatment focused on car transfer, activity tolerance, and gait. Patient performed car transfer with supervision. Patient exercised on Nustep x 7 minutes on workload 5. Patient ambulated with rolling walker 150 feet with supervision for balance - assist to pull IV pole. Patient performed supine to sit with supervision. Patient left resting in bed with all items in reach.   Arelia Longest M 03/18/2013, 3:52 PM

## 2013-03-18 NOTE — Progress Notes (Signed)
Patient ID: Brad Harrell, male   DOB: 03/08/1946, 67 y.o.   MRN: [id number] 67 y.o. male with history of CVA with left hemiparesis, non-ischemic CM, h/o cocaine abuse, chronic foley for retention; admitted on 03/04/13 with complaints of abdominal pain, N/V, right facial weakness and lethargy. MRI with remote right thalamic infarct and right superior frontal infarct and no acute findings. CT abdomen with 7.6 cm liver abscess that was treated with drain placement by IVR. He was started on broad spectrum antibiotics and Dr. Ninetta Lights was consulted for input. 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 He felt that one blood culture positive for staph likely contaminant and UC with MRSA due to colonization. Antibiotics narrowed to ampicillin sulbactam. GI consulted for workup of iron deficiency anemia and due to concerns of occult liver lesion. EGD done revealing gastric erosions and non-bleeding upper duodenal AVMs. Colonoscopy done and polyp biopsied with recommendations to avoid anticoagulation by Dr. Elnoria Howard   Subjective/Complaints: No problems overnite   Review of Systems  Gastrointestinal: .  All other systems reviewed and are negative.   Objective: Vital Signs: Blood pressure 128/76, pulse 79, temperature 98 F (36.7 C), temperature source Oral, resp. rate 18, weight 84.6 kg (186 lb 8.2 oz), SpO2 100.00%. No results found. Results for orders placed during the hospital encounter of 03/12/13 (from the past 72 hour(s))  COMPREHENSIVE METABOLIC PANEL     Status: Abnormal   Collection Time    03/17/13  5:35 AM      Result Value Range   Sodium 138  135 - 145 mEq/L   Potassium 3.8  3.5 - 5.1 mEq/L   Chloride 104  96 - 112 mEq/L   CO2 24  19 - 32 mEq/L   Glucose, Bld 99  70 - 99 mg/dL   BUN 8  6 - 23 mg/dL   Creatinine, Ser 1.47  0.50 - 1.35 mg/dL   Calcium 8.6  8.4 - 82.9 mg/dL   Total Protein 6.4  6.0 - 8.3 g/dL   Albumin 2.1 (*) 3.5 - 5.2 g/dL   AST 21  0 - 37  U/L   ALT 18  0 - 53 U/L   Alkaline Phosphatase 63  39 - 117 U/L   Total Bilirubin 0.3  0.3 - 1.2 mg/dL   GFR calc non Af Amer 86 (*) >90 mL/min   GFR calc Af Amer >90  >90 mL/min   Comment:            The eGFR has been calculated     using the CKD EPI equation.     This calculation has not been     validated in all clinical     situations.     eGFR's persistently     <90 mL/min signify     possible Chronic Kidney Disease.     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 no acute distress   Assessment/Plan: 1. Functional deficits secondary to Deconditioning from liver abscess superimposed on chronic left hemiparesis from CVA which require 3+ hours per day of interdisciplinary therapy in a comprehensive inpatient rehab setting. Physiatrist is providing close team supervision and 24 hour management of active  medical problems listed below. Physiatrist and rehab team continue to assess barriers to discharge/monitor patient progress toward functional and medical goals. FIM: FIM - Bathing Bathing Steps Patient Completed: Chest;Right Arm;Left Arm;Abdomen;Front perineal area;Buttocks;Right upper leg;Right lower leg (including foot);Left upper leg;Left lower leg (including foot) Bathing: 5: Supervision: Safety issues/verbal cues  FIM - Upper Body Dressing/Undressing Upper body dressing/undressing steps patient completed: Thread/unthread right sleeve of pullover shirt/dresss;Thread/unthread left sleeve of pullover shirt/dress;Put head through opening of pull over shirt/dress;Pull shirt over trunk Upper body dressing/undressing: 5: Set-up assist to: Obtain clothing/put away FIM  - Lower Body Dressing/Undressing Lower body dressing/undressing steps patient completed: Thread/unthread right pants leg;Pull pants up/down;Don/Doff right sock;Don/Doff left sock;Don/Doff right shoe;Don/Doff left shoe;Fasten/unfasten right shoe;Fasten/unfasten left shoe;Thread/unthread left pants leg Lower body dressing/undressing: 5: Supervision: Safety issues/verbal cues  FIM - Toileting Toileting steps completed by patient: Adjust clothing prior to toileting;Performs perineal hygiene Toileting: 3: Mod-Patient completed 2 of 3 steps  FIM - Archivist Transfers Assistive Devices: Art gallery manager Transfers: 5-To toilet/BSC: Supervision (verbal cues/safety issues);5-From toilet/BSC: Supervision (verbal cues/safety issues)  FIM - Bed/Chair Transfer Bed/Chair Transfer Assistive Devices: Therapist, occupational: 0: Activity did not occur  FIM - Locomotion: Wheelchair Distance: 50 Locomotion: Wheelchair: 0: Activity did not occur FIM - Locomotion: Ambulation Locomotion: Ambulation Assistive Devices: Designer, industrial/product Ambulation/Gait Assistance: 4: Min assist Locomotion: Ambulation: 0: Activity did not occur  Comprehension Comprehension Mode: Auditory Comprehension: 5-Understands complex 90% of the time/Cues < 10% of the time  Expression Expression Mode: Verbal Expression: 6-Expresses complex ideas: With extra time/assistive device  Social Interaction Social Interaction: 4-Interacts appropriately 75 - 89% of the time - Needs redirection for appropriate language or to initiate interaction.  Problem Solving Problem Solving: 3-Solves basic 50 - 74% of the time/requires cueing 25 - 49% of the time  Memory Memory: 3-Recognizes or recalls 50 - 74% of the time/requires cueing 25 - 49% of the time  Medical Problem List and Plan:  1. DVT Prophylaxis/Anticoagulation: Pharmaceutical: Lovenox  2. Pain Management: N/A  3. Mood:Will monitor and provide ego support. LCSW to follow  along.  4. Neuropsych: This patient is capable of making decisions on his own behalf.  5. Liver abscess: Continue unasyn D #14/21. Minimal drain   -plan appears to be to leave drain in until end of this week 6. CM: continue coreg bid.  7.  Urinary retention, increase cardura to 4mg . bp's ok at present  -urine culture negative  -discussed OOB to void, double voids etc    LOS (Days) 6 A FACE TO FACE EVALUATION WAS PERFORMED  Elishua Radford E 03/18/2013, 7:36 AM

## 2013-03-19 ENCOUNTER — Inpatient Hospital Stay (HOSPITAL_COMMUNITY): Payer: Medicare Other

## 2013-03-19 ENCOUNTER — Inpatient Hospital Stay (HOSPITAL_COMMUNITY): Payer: Medicare Other | Admitting: Occupational Therapy

## 2013-03-19 LAB — CREATININE, SERUM
Creatinine, Ser: 0.87 mg/dL (ref 0.50–1.35)
GFR calc non Af Amer: 86 mL/min — ABNORMAL LOW (ref >90)

## 2013-03-19 NOTE — Progress Notes (Signed)
Occupational Therapy Session Note  Patient Details  Name: Brad Harrell MRN: [id number] Date of Birth: Jan 06, 1946  Today's Date: 03/19/2013 Time: 1005-1100 Time Calculation (min): 55 min   Skilled Therapeutic Interventions/Progress Updates:    Pt declined bathing this am as he was already dressed by daughter. Pt taken to gym to work on functional mobility and UE coordination and strength. Pt used UBE 10 min.  BUE coordination with ball lifts overhead.  Chair push ups, transfers, ambulation with RW.  Pt resting in w/c at end of session as next session was about to begin.  Therapy Documentation Precautions:  Precautions Precautions: Fall;Other (comment) Precaution Comments: bulb drain on right side; blind R eye Restrictions Weight Bearing Restrictions: No Other Position/Activity Restrictions: full liquid diet   Pain: Pain Assessment Pain Assessment: No/denies pain ADL:  See FIM for current functional status  Therapy/Group: Individual Therapy  Luise Yamamoto 03/19/2013, 12:10 PM

## 2013-03-19 NOTE — Progress Notes (Signed)
Physical Therapy Note  Patient Details  Name: Brad Harrell MRN: [id number] Date of Birth: 1946/03/31 Today's Date: 03/19/2013  8:00 - 8:30 30 minutes Individual session Patient denies pain  Treatment focused on LE exercise focusing on left hip and knee flexion and short distance ambulation in congested environment. Patient resting in bed upon entering room. Patient performed resistive hip/knee flexion/extension exercises in supine. Patient supine to sit with supervision with [facility] raised and bed rails. Patient performed hip flexion in sitting x 15 reps. Patient performed standing marching exercises x 10 reps with RW for support. Patient transferred squat pivot bed <> wheelchair with supervision. Patient performed stand turn with RW bed <> wheelchair with supervision. Patient worked on side stepping with walker to access tight spaces. Patient ambulated 25+ feet x 4 within room with RW and supervision working on turns, opening/closing doors, sit to stands from bed, furniture, and wheelchair. Patient returned to bed sit to supine with supervision. Patient left resting in bed with items in reach.  Arelia Longest M 03/19/2013, 8:34 AM

## 2013-03-19 NOTE — Progress Notes (Signed)
Patient ID: Brad Harrell, male   DOB: 08-14-1946, 67 y.o.   MRN: [id number] 67 y.o. male with history of CVA with left hemiparesis, non-ischemic CM, h/o cocaine abuse, chronic foley for retention; admitted on 03/04/13 with complaints of abdominal pain, N/V, right facial weakness and lethargy. MRI with remote right thalamic infarct and right superior frontal infarct and no acute findings. CT abdomen with 7.6 cm liver abscess that was treated with drain placement by IVR. He was started on broad spectrum antibiotics and Dr. Ninetta Lights was consulted for input. 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 He felt that one blood culture positive for staph likely contaminant and UC with MRSA due to colonization. Antibiotics narrowed to ampicillin sulbactam. GI consulted for workup of iron deficiency anemia and due to concerns of occult liver lesion. EGD done revealing gastric erosions and non-bleeding upper duodenal AVMs. Colonoscopy done and polyp biopsied with recommendations to avoid anticoagulation by Dr. Elnoria Howard   Subjective/Complaints: No problems overnite No pain  Review of Systems  Gastrointestinal: .  All other systems reviewed and are negative.   Objective: Vital Signs: Blood pressure 179/98, pulse 76, temperature 98.5 F (36.9 C), temperature source Oral, resp. rate 20, weight 84.6 kg (186 lb 8.2 oz), SpO2 98.00%. No results found. Results for orders placed during the hospital encounter of 03/12/13 (from the past 72 hour(s))  COMPREHENSIVE METABOLIC PANEL     Status: Abnormal   Collection Time    03/17/13  5:35 AM      Result Value Range   Sodium 138  135 - 145 mEq/L   Potassium 3.8  3.5 - 5.1 mEq/L   Chloride 104  96 - 112 mEq/L   CO2 24  19 - 32 mEq/L   Glucose, Bld 99  70 - 99 mg/dL   BUN 8  6 - 23 mg/dL   Creatinine, Ser 1.30  0.50 - 1.35 mg/dL   Calcium 8.6  8.4 - 86.5 mg/dL   Total Protein 6.4  6.0 - 8.3 g/dL   Albumin 2.1 (*) 3.5 - 5.2 g/dL   AST 21  0  - 37 U/L   ALT 18  0 - 53 U/L   Alkaline Phosphatase 63  39 - 117 U/L   Total Bilirubin 0.3  0.3 - 1.2 mg/dL   GFR calc non Af Amer 86 (*) >90 mL/min   GFR calc Af Amer >90  >90 mL/min   Comment:            The eGFR has been calculated     using the CKD EPI equation.     This calculation has not been     validated in all clinical     situations.     eGFR's persistently     <90 mL/min signify     possible Chronic Kidney Disease.  CREATININE, SERUM     Status: Abnormal   Collection Time    03/19/13  5:00 AM      Result Value Range   Creatinine, Ser 0.87  0.50 - 1.35 mg/dL   GFR calc non Af Amer 86 (*) >90 mL/min   GFR calc Af Amer >90  >90 mL/min   Comment:            The eGFR has been calculated     using the CKD EPI equation.     This calculation has not been     validated in all clinical     situations.  eGFR's persistently     <90 mL/min signify     possible Chronic Kidney Disease.     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 no acute distress   Assessment/Plan: 1. Functional deficits secondary to Deconditioning from liver abscess superimposed on chronic left hemiparesis from CVA which require 3+ hours per day of interdisciplinary therapy in a comprehensive inpatient rehab setting. Physiatrist is providing close team supervision and 24 hour management of active medical problems listed below. Physiatrist and rehab team continue to assess barriers to discharge/monitor patient progress toward functional and medical goals. FIM: FIM - Bathing Bathing Steps Patient Completed: Chest;Right Arm;Left Arm;Abdomen;Front perineal area;Buttocks;Right upper  leg;Right lower leg (including foot);Left upper leg;Left lower leg (including foot) Bathing: 5: Supervision: Safety issues/verbal cues  FIM - Upper Body Dressing/Undressing Upper body dressing/undressing steps patient completed: Thread/unthread right sleeve of pullover shirt/dresss;Thread/unthread left sleeve of pullover shirt/dress;Put head through opening of pull over shirt/dress;Pull shirt over trunk Upper body dressing/undressing: 5: Set-up assist to: Obtain clothing/put away FIM - Lower Body Dressing/Undressing Lower body dressing/undressing steps patient completed: Thread/unthread right pants leg;Pull pants up/down;Don/Doff right sock;Don/Doff left sock;Don/Doff right shoe;Don/Doff left shoe;Fasten/unfasten right shoe;Fasten/unfasten left shoe;Thread/unthread left pants leg Lower body dressing/undressing: 5: Supervision: Safety issues/verbal cues  FIM - Toileting Toileting steps completed by patient: Adjust clothing prior to toileting;Performs perineal hygiene Toileting: 3: Mod-Patient completed 2 of 3 steps  FIM - Archivist Transfers Assistive Devices: Art gallery manager Transfers: 5-To toilet/BSC: Supervision (verbal cues/safety issues);5-From toilet/BSC: Supervision (verbal cues/safety issues)  FIM - Banker Devices: Walker;Arm rests;Bed rails;HOB elevated Bed/Chair Transfer: 6: Supine > Sit: No assist;6: Sit > Supine: No assist;5: Bed > Chair or W/C: Supervision (verbal cues/safety issues);5: Chair or W/C > Bed: Supervision (verbal cues/safety issues)  FIM - Locomotion: Wheelchair Distance: 80 feet Locomotion: Wheelchair: 2: Travels 50 - 149 ft with supervision, cueing or coaxing FIM - Locomotion: Ambulation Locomotion: Ambulation Assistive Devices: Designer, industrial/product Ambulation/Gait Assistance: 5: Supervision Locomotion: Ambulation: 1: Travels less than 50 ft with supervision/safety issues  Comprehension Comprehension Mode:  Auditory Comprehension: 4-Understands basic 75 - 89% of the time/requires cueing 10 - 24% of the time  Expression Expression Mode: Verbal Expression: 5-Expresses basic 90% of the time/requires cueing < 10% of the time.  Social Interaction Social Interaction: 6-Interacts appropriately with others with medication or extra time (anti-anxiety, antidepressant).  Problem Solving Problem Solving: 4-Solves basic 75 - 89% of the time/requires cueing 10 - 24% of the time  Memory Memory: 4-Recognizes or recalls 75 - 89% of the time/requires cueing 10 - 24% of the time  Medical Problem List and Plan:  1. DVT Prophylaxis/Anticoagulation: Pharmaceutical: Lovenox  2. Pain Management: N/A  3. Mood:Will monitor and provide ego support. LCSW to follow along.  4. Neuropsych: This patient is capable of making decisions on his own behalf.  5. Liver abscess: Continue unasyn D #14/21. Can change to QD rocephin for D/C   -plan appears to be to leave drain in until end of this week 6. CM: continue coreg bid.  7.  Urinary retention, increase cardura to 4mg . bp's ok at present  -urine culture negative  -discussed OOB to void,  double voids etc    LOS (Days) 7 A FACE TO FACE EVALUATION WAS PERFORMED  Breyana Follansbee E 03/19/2013, 9:59 AM

## 2013-03-19 NOTE — Progress Notes (Signed)
Social Work Patient ID: Brad Harrell, male   DOB: Feb 29, 1946, 67 y.o.   MRN: [id number] Met with pt to inform of team conference goals-supervision level and hope to discharge 5/23, if CT scan comes back and drain can be removed. He is aware they are switching to different antibiotic once a day.  Referral made to [facility] since has seen pt in the past and he prefers, to provide [facility], PT, and OT follow up.  Pt has all DME from previous admissions.  He hopes to go home by Friday.

## 2013-03-19 NOTE — Progress Notes (Signed)
Physical Therapy Session Note  Patient Details  Name: Brad Harrell MRN: [id number] Date of Birth: 04/12/1946  Today's Date: 03/19/2013 Time: 0900-1000 Time Calculation (min): 60 min  Short Term Goals: Week 1:  PT Short Term Goal 1 (Week 1): STG=LTG due to short LOS     Skilled Therapeutic Interventions/Progress Updates:  Treatment focused on bed mobility, transfers, w/c propulsion, neuromuscular re-education via VCs, manual cues.  Daughter present, observed pt's gait. She stated it was not very different PTA, after CVA about 1 year ago.  Pt and daughter stated he had had no falls at home PTA.  Daughter stated that pt managed 2 steps without rails at home, using RW, by placing RW at top of both steps, leaning over and stepping up 2 steps; reverse for descending.  Supine> sit using bed rails, supervision.  Transfer bed> w/c with VCs for safety with hand placement on RW.  neuromuscular re-education for L LE doing Fall Prevention 1 exs: modified R and L tandem mini squats, calf raises, hip abduction, hamstring curls, with mod assist for balance or use of heavy table for UE support, manual cues for isolated, appropriated movements.  Gait with RW x 150' x  2, once without LAFO, once with LAFO ([name] Toe Off).  neuromuscular re-education, focusing on L foot clearance, upright posture, forward gaze.  Clearance improved when pt wore LAFO, but he said it was uncomfortable.  Daughter stated that pt had an AFO from previous CVA; she plans to bring it in this afternoon.  W/c using bil UEs and RLE, with min assist for steering, x 90'.      Therapy Documentation Precautions:  Precautions Precautions: Fall;Other (comment) Precaution Comments: bulb drain on right side; blind R eye Restrictions Weight Bearing Restrictions: No Other Position/Activity Restrictions: full liquid diet   Pain: Pain Assessment Pain Assessment: No/denies pain   Locomotion : Ambulation Ambulation/Gait Assistance: 5:  Supervision Wheelchair Mobility Distance: 90    Other Treatments: Treatments Neuromuscular Facilitation: Left;Lower Extremity;Forced use;Activity to increase timing and sequencing;Activity to increase motor control;Activity to increase grading;Activity to increase anterior-posterior weight shifting;Activity to increase lateral weight shifting  See FIM for current functional status  Therapy/Group: Individual Therapy  Caitlan Chauca 03/19/2013, 11:08 AM

## 2013-03-19 NOTE — Progress Notes (Addendum)
  Subjective: Pt without new c/o; reports no abd pain, fever, chills  Objective: Vital signs in last 24 hours: Temp:  [98.5 F (36.9 C)] 98.5 F (36.9 C) (05/20 1500) Pulse Rate:  [76] 76 (05/20 1500) Resp:  [20] 20 (05/20 1500) BP: (143-179)/(85-98) 179/98 mmHg (05/20 2247) SpO2:  [98 %] 98 % (05/20 1500) Last BM Date: 03/18/13  Intake/Output from previous day: 05/20 0701 - 05/21 0700 In: 960 [P.O.:960] Out: 205 [Urine:200; Drains:5] Intake/Output this shift: Total I/O In: 360 [P.O.:360] Out: -   Hepatic drain intact, output minimal, drain flushed with 5 cc's sterile NS with return of blood-tinged fluid and clot fragments; insertion site ok; new J-P bulb/tubing attached to drain  Lab Results:  No results found for this basename: WBC, HGB, HCT, PLT,  in the last 72 hours BMET  Recent Labs  03/17/13 0535 03/19/13 0500  NA 138  --   K 3.8  --   CL 104  --   CO2 24  --   GLUCOSE 99  --   BUN 8  --   CREATININE 0.88 0.87  CALCIUM 8.6  --    PT/INR No results found for this basename: LABPROT, INR,  in the last 72 hours ABG No results found for this basename: PHART, PCO2, PO2, HCO3,  in the last 72 hours  Studies/Results: No results found.  Anti-infectives: Anti-infectives   Start     Dose/Rate Route Frequency Ordered Stop   03/12/13 2330  Ampicillin-Sulbactam (UNASYN) 3 g in sodium chloride 0.9 % 100 mL IVPB     3 g 100 mL/hr over 60 Minutes Intravenous Every 6 hours 03/12/13 1829        Assessment/Plan: s/p hepatic abscess drainage 5/7; for  f/u CT on 5/23 to assess adequacy of drainage and possible removal  LOS: 7 days    ALLRED,D [facility] 03/19/2013

## 2013-03-19 NOTE — Progress Notes (Signed)
Occupational Therapy Note  Patient Details  Name: Brad Harrell MRN: [id number] Date of Birth: July 03, 1946 Today's Date: 03/19/2013  Time: 11am-11:42am ( .) Pt seen for 1:1 OT session focusing on standing/activity tolerance, transfers, sit <> stand and [facility]. Pt sitting in wheelchair upon arrival in gym, reporting fatigue and no pain. Standing tolerance activity with pt standing at raised table to completed 2 puzzles on "pipe tree." Pt required encouragement for standing and took multiple rest breaks during activity. L knee buckled several times while in standing. Gave pt red (medium soft) theraputty to work on [facility] and L hand strength. Gave several exercises, with pt demonstrating. Wheeled pt back to room as he refused to walk secondary to fatigue. Ended session with pt sitting in wheelchair in room with call bell in place.    Jaquitta Dupriest Hessie Diener 03/19/2013, 11:44 AM

## 2013-03-20 ENCOUNTER — Inpatient Hospital Stay (HOSPITAL_COMMUNITY): Payer: Medicare Other | Admitting: Occupational Therapy

## 2013-03-20 ENCOUNTER — Inpatient Hospital Stay (HOSPITAL_COMMUNITY): Payer: Medicare Other

## 2013-03-20 DIAGNOSIS — R5381 Other malaise: Secondary | ICD-10-CM

## 2013-03-20 DIAGNOSIS — K750 Abscess of liver: Secondary | ICD-10-CM

## 2013-03-20 DIAGNOSIS — I69959 Hemiplegia and hemiparesis following unspecified cerebrovascular disease affecting unspecified side: Secondary | ICD-10-CM

## 2013-03-20 NOTE — Progress Notes (Addendum)
Physical Therapy Session Note  Patient Details  Name: Brad Harrell MRN: [id number] Date of Birth: [birth year]  Today's Date: 03/20/2013 Time: 0900-1000 and 4098-1191 Time Calculation (min): 60 min and 51 min  Short Term Goals: Week 1:  PT Short Term Goal 1 (Week 1): STG=LTG due to short LOS    Skilled Therapeutic Interventions/Progress Updates:  Session 1: Daughter Pam reported that AFO that pt had at home was for RLE.  [name] [employer], which he wore for a short period of time after previous CVA. Pt declined pursuing a L AFO which would help with clearing L foot. Mobility education completed today.  Treatment focused on family ed for gait on level and 5 steps and 2 steps without rails as per home situation, car transfer.  Car transfer with RW, supervision, min cues for safe placement of hands.  Up/down 5 steps 2 rails with supervision;  Up/down 2 steps without rails, using RW, min assist, x 3 with daughter.  Session 2:  Berg Balance test= 18/56.  neuromuscular re-education via demo, VCS, manual cues for LLE swing phase to clear foot, terminal hip extension, wt shifting in various positions, trunk shortening/lenthening with wt shifting.  Pt has limited trunk rotation L or R. Kinetron for trunk rotation, level 3, rated 13 on Borg scale. BP 114/72 , HR 86 after 3 min rest after 5 min ex, in sitting. Gait x 150' with RW supervision.  W/c propulsion using bil UEs and intermittently bil LEs for steering.    Therapy Documentation Precautions:  Precautions Precautions: Fall;Other (comment) Precaution Comments: bulb drain on right side; blind R eye Restrictions Weight Bearing Restrictions: No Other Position/Activity Restrictions: full liquid diet Pain: Pain Assessment Pain Assessment: No/denies pain Other Treatments: Treatments Neuromuscular Facilitation: Left;Upper and Lower Extremity;Forced use;Activity to increase timing and sequencing;Activity to increase motor control;Activity  to increase grading;Activity to increase anterior-posterior weight shifting;Activity to increase lateral weight shifting, wt bearing  See FIM for current functional status  Therapy/Group: Individual Therapy  Harriett Azar 03/20/2013, 12:13 PM

## 2013-03-20 NOTE — Progress Notes (Signed)
Note reviewed and accurately reflects treatment session.   

## 2013-03-20 NOTE — Progress Notes (Signed)
Occupational Therapy Session Note  Patient Details  Name: Brad Harrell MRN: [id number] Date of Birth: [birth year]  Today's Date: 03/20/2013 Time: 1005-1100 Time Calculation (min): 55 min   Skilled Therapeutic Interventions/Progress Updates:      Pt seen for BADL retraining of bathing and dressing with a focus on functional mobility and standing balance. Pt's brief was wet and he stated that he did not need to try to use the bathroom again. Pt completed B/D at sink level with distant supervision. Today he used a pull on brief. He then went to gym to work on standing balance and UE strengthening.  He worked on side to side stepping, forward reaching, and chair pushups 10x2.  He only needed a few sit down breaks.  He also used a 4# dowel for shoulder and chest presses. Pt's PT had arrived for his next session.  Therapy Documentation Precautions:  Precautions Precautions: Fall;Other (comment) Precaution Comments: bulb drain on right side; blind R eye Restrictions Weight Bearing Restrictions: No Other Position/Activity Restrictions: full liquid diet Pain: Pain Assessment Pain Assessment: No/denies pain ADL:  See FIM for current functional status  Therapy/Group: Individual Therapy  SAGUIER,JULIA 03/20/2013, 11:44 AM

## 2013-03-20 NOTE — Progress Notes (Signed)
Patient ID: Brad Harrell, male   DOB: 02/26/1946, 67 y.o.   MRN: [id number] 67 y.o. male with history of CVA with left hemiparesis, non-ischemic CM, h/o cocaine abuse, chronic foley for retention; admitted on 03/04/13 with complaints of abdominal pain, N/V, right facial weakness and lethargy. MRI with remote right thalamic infarct and right superior frontal infarct and no acute findings. CT abdomen with 7.6 cm liver abscess that was treated with drain placement by IVR. He was started on broad spectrum antibiotics and Dr. Ninetta Lights was consulted for input. 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 He felt that one blood culture positive for staph likely contaminant and UC with MRSA due to colonization. Antibiotics narrowed to ampicillin sulbactam. GI consulted for workup of iron deficiency anemia and due to concerns of occult liver lesion. EGD done revealing gastric erosions and non-bleeding upper duodenal AVMs. Colonoscopy done and polyp biopsied with recommendations to avoid anticoagulation by Dr. Elnoria Howard   Subjective/Complaints: No problems overnite No pain  Review of Systems  Gastrointestinal: .  All other systems reviewed and are negative.   Objective: Vital Signs: Blood pressure 127/65, pulse 77, temperature 97.9 F (36.6 C), temperature source Oral, resp. rate 20, weight 84.6 kg (186 lb 8.2 oz), SpO2 99.00%. No results found. Results for orders placed during the hospital encounter of 03/12/13 (from the past 72 hour(s))  CREATININE, SERUM     Status: Abnormal   Collection Time    03/19/13  5:00 AM      Result Value Range   Creatinine, Ser 0.87  0.50 - 1.35 mg/dL   GFR calc non Af Amer 86 (*) >90 mL/min   GFR calc Af Amer >90  >90 mL/min   Comment:            The eGFR has been calculated     using the CKD EPI equation.     This calculation has not been     validated in all clinical     situations.     eGFR's persistently     <90 mL/min signify   possible Chronic Kidney Disease.     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 no acute distress   Assessment/Plan: 1. Functional deficits secondary to Deconditioning from liver abscess superimposed on chronic left hemiparesis from CVA which require 3+ hours per day of interdisciplinary therapy in a comprehensive inpatient rehab setting. Physiatrist is providing close team supervision and 24 hour management of active medical problems listed below. Physiatrist and rehab team continue to assess barriers to discharge/monitor patient progress toward functional and medical goals. FIM: FIM - Bathing Bathing Steps Patient Completed: Chest;Right Arm;Left Arm;Abdomen;Front perineal area;Buttocks;Right upper leg;Right lower leg (including foot);Left upper leg;Left lower leg (including foot) Bathing: 5: Supervision: Safety issues/verbal cues  FIM - Upper Body Dressing/Undressing Upper body dressing/undressing steps patient completed: Thread/unthread right sleeve of pullover shirt/dresss;Thread/unthread left sleeve of pullover shirt/dress;Put head through opening of pull over shirt/dress;Pull shirt over trunk Upper body dressing/undressing: 5: Set-up assist to: Obtain clothing/put away FIM - Lower Body Dressing/Undressing Lower body dressing/undressing steps patient completed: Thread/unthread right pants leg;Pull pants up/down;Don/Doff right sock;Don/Doff left sock;Don/Doff right shoe;Don/Doff left shoe;Fasten/unfasten right shoe;Fasten/unfasten left shoe;Thread/unthread  left pants leg Lower body dressing/undressing: 5: Supervision: Safety issues/verbal cues  FIM - Toileting Toileting  steps completed by patient: Adjust clothing prior to toileting;Performs perineal hygiene Toileting: 3: Mod-Patient completed 2 of 3 steps  FIM - Diplomatic Services operational officer Devices: Art gallery manager Transfers: 4-To toilet/BSC: Min A (steadying Pt. > 75%);4-From toilet/BSC: Min A (steadying Pt. > 75%)  FIM - Banker Devices: Walker;Arm rests Bed/Chair Transfer: 5: Sit > Supine: Supervision (verbal cues/safety issues);5: Bed > Chair or W/C: Supervision (verbal cues/safety issues);5: Supine > Sit: Supervision (verbal cues/safety issues);5: Chair or W/C > Bed: Supervision (verbal cues/safety issues)  FIM - Locomotion: Wheelchair Distance: 90 Locomotion: Wheelchair: 2: Travels 50 - 149 ft with minimal assistance (Pt.>75%) FIM - Locomotion: Ambulation Locomotion: Ambulation Assistive Devices: Orthosis (L AFO on trial) Ambulation/Gait Assistance: 5: Supervision Locomotion: Ambulation: 5: Travels 150 ft or more with supervision/safety issues  Comprehension Comprehension Mode: Auditory Comprehension: 4-Understands basic 75 - 89% of the time/requires cueing 10 - 24% of the time  Expression Expression Mode: Verbal Expression: 5-Expresses basic needs/ideas: With no assist  Social Interaction Social Interaction: 6-Interacts appropriately with others with medication or extra time (anti-anxiety, antidepressant).  Problem Solving Problem Solving: 4-Solves basic 75 - 89% of the time/requires cueing 10 - 24% of the time  Memory Memory: 4-Recognizes or recalls 75 - 89% of the time/requires cueing 10 - 24% of the time  Medical Problem List and Plan:  1. DVT Prophylaxis/Anticoagulation: Pharmaceutical: Lovenox  2. Pain Management: N/A  3. Mood:Will monitor and provide ego support. LCSW to follow along.  4. Neuropsych: This patient is capable of making decisions on his own behalf.  5. Liver abscess: Continue unasyn D #15/21. Can change to QD  rocephin for D/C   -plan appears to be to leave drain in until end of this week, 6. CM: continue coreg bid.  7.  Urinary retention, increase cardura to 4mg . bp's ok at present  -urine culture negative  -discussed OOB to void, double voids etc    LOS (Days) 8 A FACE TO FACE EVALUATION WAS PERFORMED  Kewan Mcnease E 03/20/2013, 8:10 AM

## 2013-03-20 NOTE — Progress Notes (Signed)
Occupational Therapy Session Note  Patient Details  Name: Brad Harrell MRN: [id number] Date of Birth: 1946-07-18  Today's Date: 03/20/2013 Time: 1345-1430 Time Calculation (min): 45 min  Short Term Goals=Long term goals  Skilled Therapeutic Interventions/Progress Updates:  Individual Therapy Tx Pt with no report of pain.  Upon entering room, pt supine in bed.  Pt from bed -> W/C using squat pivot transfer.  Pt reported he needed to use the BR so pt propelled self into BR with assist from OT and used elevated toilet seat and grab rails to assist.  Pt required steady assist for balance during peri care and then transferred -> W/C.  Next, pt propelled self in W/C to apt (~200 ft) and ambulated to BR using R/W where he performed tub bench transfer from W/C level.  Pt impulsive during all fx transfers and needed mod v.c's for safety awareness during transfers.  Next, pt propelled W/C backwards to the rehab gym for about 10 ft.  In the rehab gym, skilled intervention focused on increasing the pt's dynamic standing balance/tolerance and UE endurance/strength by using the UBE in the standing position and from W/C level.  Pt tolerated 1.5 mins in the standing position and 2.5 mins @ W/C level.  OT then pushed the pt back to his room where he was left in the W/C with the call bell and phone within reach.  Therapy Documentation Precautions:  Precautions Precautions: Fall;Other (comment) Precaution Comments: bulb drain on right side; blind R eye Restrictions Weight Bearing Restrictions: No Other Position/Activity Restrictions: full liquid diet  See FIM for current functional status  Therapy/Group: Individual Therapy  Roderic Palau 03/20/2013, 2:48 PM

## 2013-03-20 NOTE — Progress Notes (Signed)
Inpatient RehabilitationTeam Conference and Plan of Care Update Date: 03/19/2013   Time: 11:11 AM    Patient Name: Brad Harrell      Medical Record Number: [id number]  Date of Birth: 1946-09-14 Sex: Male         Room/Bed: 4032/4032-01 Payor Info: Payor: MEDICARE / Plan: MEDICARE PART A AND B / Product Type: *No Product type* /    Admitting Diagnosis: deconditioned  Admit Date/Time:  03/12/2013  6:13 PM Admission Comments: No comment available   Primary Diagnosis:  Physical deconditioning Principal Problem: Physical deconditioning  Patient Active Problem List   Diagnosis Date Noted  . Physical deconditioning 03/13/2013  . Anemia, iron deficiency 03/08/2013  . Bacteremia 03/06/2013  . Liver abscess 03/05/2013  . Encephalopathy, metabolic 03/05/2013  . Anemia 03/05/2013  . Complicated UTI (urinary tract infection) 08/15/2012  . SIRS (systemic inflammatory response syndrome) 08/15/2012  . ARF (acute renal failure) 08/15/2012  . Hypertension   . Urinary retention   . Stroke   . Hyperlipidemia   . Cardiomyopathy   . H/O cocaine abuse     Expected Discharge Date:  03/21/13  Team Members Present:   Dr. Wynn Banker, Kriste Basque Dupree SW, Melanee Spry RN, [facility] Perkinson OT, Fae Pippin ST, [place] PT, Tora Duck PPS Coordinator, Rosalio Loud OT, Perlie Mayo PT, Wanda Plump PT, Bretta Bang Supervisor, Bayard Hugger RN Director, Laural Roes RN       Current Status/Progress Goal Weekly Team Focus  Medical   Hypertension still with spikes in p.m.  Normal tensive  Cardura restarted monitor   Bowel/Bladder             Swallow/Nutrition/ Hydration             ADL's   family ed with daughter completed 03/18/13, supervision overall, except for min assist with tub bench transfer  supervision overall  activity tolerance, standing balance, UE strengthening   Mobility   supervision to occasional min assist with transfers and ambulation due to impulsivity and safety concerns   supervision ambulation; min assist with stairs no rail  LE strengthening; balance, ambulation, and stairs; patient/family education   Communication             Safety/Cognition/ Behavioral Observations            Pain             Skin                *See Care Plan and progress notes for long and short-term goals.  Barriers to Discharge: Still has drain would like this to come out before discharge    Possible Resolutions to Barriers:  Repeat CT scan Friday, interventional radiology to decide on pulling drain at that time    Discharge Planning/Teaching Needs:  Home with son and daughter, he reports someone is always there      Team Discussion:  Plan CT Friday to see if can d/c drain before d/c home.  Also, want to see if can change to a qd IV antibx.  Revisions to Treatment Plan:  none   Continued Need for Acute Rehabilitation Level of Care: The patient requires daily medical management by a physician with specialized training in physical medicine and rehabilitation for the following conditions: Daily direction of a multidisciplinary physical rehabilitation program to ensure safe treatment while eliciting the highest outcome that is of practical value to the patient.: Yes Daily medical management of patient stability for increased activity during participation in an  intensive rehabilitation regime.: Yes Daily analysis of laboratory values and/or radiology reports with any subsequent need for medication adjustment of medical intervention for : Post surgical problems;Neurological problems  Brock Ra 03/20/2013, 3:32 PM

## 2013-03-21 ENCOUNTER — Encounter (HOSPITAL_COMMUNITY): Payer: Self-pay | Admitting: Radiology

## 2013-03-21 ENCOUNTER — Inpatient Hospital Stay (HOSPITAL_COMMUNITY): Payer: Medicare Other

## 2013-03-21 DIAGNOSIS — I69959 Hemiplegia and hemiparesis following unspecified cerebrovascular disease affecting unspecified side: Secondary | ICD-10-CM

## 2013-03-21 DIAGNOSIS — R5381 Other malaise: Secondary | ICD-10-CM

## 2013-03-21 DIAGNOSIS — K750 Abscess of liver: Secondary | ICD-10-CM

## 2013-03-21 MED ORDER — HEPARIN SOD (PORK) LOCK FLUSH 100 UNIT/ML IV SOLN
250.0000 [IU] | INTRAVENOUS | Status: AC | PRN
  Administered 2013-03-21: 250 [IU]

## 2013-03-21 MED ORDER — DEXTROSE 5 % IV SOLN: 2.0000 g | INTRAVENOUS | Status: DC

## 2013-03-21 MED ORDER — IOHEXOL 300 MG/ML  SOLN
100.0000 mL | Freq: Once | INTRAMUSCULAR | Status: AC | PRN
  Administered 2013-03-21: 100 mL via INTRAVENOUS

## 2013-03-21 MED ORDER — DEXTROSE 5 % IV SOLN
2.0000 g | INTRAVENOUS | Status: DC
  Administered 2013-03-21: 2 g via INTRAVENOUS
  Filled 2013-03-21: qty 2

## 2013-03-21 MED ORDER — CARVEDILOL 3.125 MG PO TABS: 6.2500 mg | ORAL_TABLET | Freq: Two times a day (BID) | ORAL | Status: DC

## 2013-03-21 MED ORDER — ADULT MULTIVITAMIN W/MINERALS CH: 1.0000 | ORAL_TABLET | Freq: Every day | ORAL | Status: DC

## 2013-03-21 MED ORDER — DSS 100 MG PO CAPS: 100.0000 mg | ORAL_CAPSULE | Freq: Two times a day (BID) | ORAL | Status: DC

## 2013-03-21 MED ORDER — POLYETHYLENE GLYCOL 3350 17 G PO PACK: 17.0000 g | PACK | Freq: Two times a day (BID) | ORAL | Status: DC

## 2013-03-21 MED ORDER — METRONIDAZOLE 500 MG PO TABS
500.0000 mg | ORAL_TABLET | Freq: Three times a day (TID) | ORAL | Status: DC
Start: 2013-03-21 — End: 2013-03-21
  Administered 2013-03-21: 500 mg via ORAL
  Filled 2013-03-21 (×4): qty 1

## 2013-03-21 MED ORDER — SACCHAROMYCES BOULARDII 250 MG PO CAPS: 250.0000 mg | ORAL_CAPSULE | Freq: Two times a day (BID) | ORAL | Status: DC

## 2013-03-21 MED ORDER — METRONIDAZOLE 500 MG PO TABS: 500.0000 mg | ORAL_TABLET | Freq: Three times a day (TID) | ORAL | Status: DC

## 2013-03-21 NOTE — Progress Notes (Signed)
Social Work Discharge Note Discharge Note  The overall goal for the admission was met for:   Discharge location: Yes-HOME WITH SON AND DAUGHTER-24 HR SUPERVISION  Length of Stay: Yes-9 DAYS  Discharge activity level: Yes-SUPERVISION LEVEL  Home/community participation: Yes  Services provided included: MD, RD, PT, OT, SLP, RN, Pharmacy and SW  Financial Services: Medicare  Follow-up services arranged: Home Health: [facility] and Patient/Family request agency HH: PREF HAD IN THE PAST, DME: HAS ALL DME  Comments (or additional information):FAMILY EDUCATION COMPLETED AND PT READY FOR DISCHARGE  Patient/Family verbalized understanding of follow-up arrangements: Yes  Individual responsible for coordination of the follow-up plan: PAM-DAUGHTER  Confirmed correct DME delivered: Lucy Chris 03/21/2013    Lucy Chris

## 2013-03-21 NOTE — Progress Notes (Signed)
Physical Therapy Discharge Summary  Patient Details  Name: EIAN VANDERVELDEN MRN: [id number] Date of Birth: Mar 03, 1946  Today's Date: 03/21/2013  Patient has met 11 of 11 long term goals due to improved activity tolerance, improved balance, improved postural control, increased strength, functional use of  left lower extremity and improved coordination.  Patient to discharge at an ambulatory level Supervision.   Patient's care partner is independent to provide the necessary physical assistance at discharge.  Reasons goals not met:  N/a patient met all goals  Recommendation:  Patient will benefit from ongoing skilled PT services in home health setting to continue to advance safe functional mobility, address ongoing impairments in left LE strength and control,  and minimize fall risk.  Equipment: No equipment provided. Patient had all needed equipment including rolling walker and wheelchair.  Reasons for discharge: treatment goals met and discharge from hospital  Patient/family agrees with progress made and goals achieved: Yes  See FIM for current functional status  Alma Friendly 03/21/2013, 12:16 PM

## 2013-03-21 NOTE — Progress Notes (Signed)
Pt discharged at 1225 with family to home. Discharge instructions given by Marissa Nestle, PA with verbal understanding.  Belongings with pt and family.

## 2013-03-21 NOTE — Progress Notes (Signed)
Occupational Therapy Discharge Summary  Patient Details  Name: Brad Harrell MRN: [id number] Date of Birth: 1946/07/09  Today's Date: 03/21/2013    Patient has met 5 of 7 long term goals due to improved activity tolerance, improved balance and ability to compensate for deficits.  Patient to discharge at overall supervision to  [facility] Assist level.  Patient's care partner is independent to provide the necessary physical and cognitive assistance at discharge.    Reasons goals not met: Pt had  long term goals of supervision with toilet transfers and tub bench transfers.  Due to impulsivity, pt required steady assist with both transfers yesterday.  He is capable of performing them with supervision if he takes his time.  Recommendation:  Patient will benefit from ongoing skilled OT services in home health setting to continue to advance functional skills in the area of BADL.  Equipment: No equipment provided  Reasons for discharge: treatment goals met  Patient/family agrees with progress made and goals achieved: Yes  OT Discharge ADL  supervision with BADLs, steady assist with ADL transfers Vision/Perception  Vision - History Baseline Vision: Legally blind Patient Visual Report: No change from baseline Praxis Praxis: Intact  Cognition Overall Cognitive Status: History of cognitive impairments - at baseline Sensation Sensation Light Touch: Appears Intact Stereognosis: Appears Intact Hot/Cold: Appears Intact Proprioception: Appears Intact Coordination Gross Motor Movements are Fluid and Coordinated: No Fine Motor Movements are Fluid and Coordinated: No Motor  Motor Motor: Hemiplegia Mobility    supervision to steady assist with ADL transfers Trunk/Postural Assessment  Cervical Assessment Cervical Assessment: Exceptions to [facility] (head tilted to the left) Thoracic Assessment Thoracic Assessment: Within Functional Limits (kyphotic in static sitting) Lumbar Assessment Lumbar  Assessment: Within Functional Limits  Balance Static Standing Balance Static Standing - Level of Assistance: 5: Stand by assistance Dynamic Standing Balance Dynamic Standing - Level of Assistance: 5: Stand by assistance Dynamic Standing - Comments: with UE support on sink or countertop Extremity/Trunk Assessment RUE Assessment RUE Assessment: Within Functional Limits LUE Assessment LUE Assessment: Exceptions to [facility] LUE Strength LUE Overall Strength Comments: Pt has functional movement for basic tasks but  overall decreased coordination and 3+/5 strength  See FIM for current functional status  SAGUIER,JULIA 03/21/2013, 8:13 AM

## 2013-03-21 NOTE — Progress Notes (Signed)
Patient ID: Brad Harrell, male   DOB: 1945/12/25, 67 y.o.   MRN: [id number] 67 y.o. male with history of CVA with left hemiparesis, non-ischemic CM, h/o cocaine abuse, chronic foley for retention; admitted on 03/04/13 with complaints of abdominal pain, N/V, right facial weakness and lethargy. MRI with remote right thalamic infarct and right superior frontal infarct and no acute findings. CT abdomen with 7.6 cm liver abscess that was treated with drain placement by IVR. He was started on broad spectrum antibiotics and Dr. Ninetta Lights was consulted for input. 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 He felt that one blood culture positive for staph likely contaminant and UC with MRSA due to colonization. Antibiotics narrowed to ampicillin sulbactam. GI consulted for workup of iron deficiency anemia and due to concerns of occult liver lesion. EGD done revealing gastric erosions and non-bleeding upper duodenal AVMs. Colonoscopy done and polyp biopsied with recommendations to avoid anticoagulation by Dr. Elnoria Howard   Subjective/Complaints: Denies problems. Ready to go home.  No pain  Review of Systems  Gastrointestinal: .  All other systems reviewed and are negative.   Objective: Vital Signs: Blood pressure 160/88, pulse 84, temperature 98.2 F (36.8 C), temperature source Oral, resp. rate 20, weight 84.6 kg (186 lb 8.2 oz), SpO2 96.00%. No results found. Results for orders placed during the hospital encounter of 03/12/13 (from the past 72 hour(s))  CREATININE, SERUM     Status: Abnormal   Collection Time    03/19/13  5:00 AM      Result Value Range   Creatinine, Ser 0.87  0.50 - 1.35 mg/dL   GFR calc non Af Amer 86 (*) >90 mL/min   GFR calc Af Amer >90  >90 mL/min   Comment:            The eGFR has been calculated     using the CKD EPI equation.     This calculation has not been     validated in all clinical     situations.     eGFR's persistently     <90 mL/min  signify     possible Chronic Kidney Disease.     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 no acute distress   Assessment/Plan: 1. Functional deficits secondary to Deconditioning from liver abscess superimposed on chronic left hemiparesis from CVA which require 3+ hours per day of interdisciplinary therapy in a comprehensive inpatient rehab setting. Physiatrist is providing close team supervision and 24 hour management of active medical problems listed below. Physiatrist and rehab team continue to assess barriers to discharge/monitor patient progress toward functional and medical goals.  DC home with or without drain today. FIM: FIM - Bathing Bathing Steps Patient Completed: Chest;Right Arm;Left Arm;Abdomen;Front perineal area;Buttocks;Right upper leg;Right lower leg (including foot);Left upper leg;Left lower leg (including foot) Bathing: 5: Supervision: Safety issues/verbal cues  FIM - Upper Body Dressing/Undressing Upper body dressing/undressing steps patient completed: Thread/unthread right sleeve of pullover shirt/dresss;Thread/unthread left sleeve of pullover shirt/dress;Put head through opening of pull over shirt/dress;Pull shirt over trunk Upper body dressing/undressing: 5: Set-up assist to: Obtain clothing/put away FIM - Lower Body Dressing/Undressing Lower body dressing/undressing steps patient completed: Thread/unthread right underwear  leg;Thread/unthread left underwear leg;Pull underwear up/down;Thread/unthread right pants leg;Thread/unthread left pants leg;Pull pants up/down;Don/Doff right sock;Don/Doff left sock;Don/Doff right shoe;Don/Doff left  shoe;Fasten/unfasten right shoe;Fasten/unfasten left shoe Lower body dressing/undressing: 5: Supervision: Safety issues/verbal cues  FIM - Toileting Toileting steps completed by patient: Adjust clothing prior to toileting;Performs perineal hygiene;Adjust clothing after toileting Toileting Assistive Devices: Grab bar or rail for support Toileting: 4: Steadying assist  FIM - Diplomatic Services operational officer Devices: Grab bars;Elevated toilet seat Toilet Transfers: 4-To toilet/BSC: Min A (steadying Pt. > 75%) (Pt impulsive during transfer)  FIM - Bed/Chair Transfer Bed/Chair Transfer Assistive Devices: Bed rails;HOB elevated;Arm rests Bed/Chair Transfer: 5: Supine > Sit: Supervision (verbal cues/safety issues);4: Bed > Chair or W/C: Min A (steadying Pt. > 75%)  FIM - Locomotion: Wheelchair Distance: 100 Locomotion: Wheelchair: 2: Travels 50 - 149 ft with supervision, cueing or coaxing FIM - Locomotion: Ambulation Locomotion: Ambulation Assistive Devices: Designer, industrial/product Ambulation/Gait Assistance: 5: Supervision Locomotion: Ambulation: 5: Travels 150 ft or more with supervision/safety issues (150)  Comprehension Comprehension Mode: Auditory Comprehension: 3-Understands basic 50 - 74% of the time/requires cueing 25 - 50%  of the time  Expression Expression Mode: Verbal Expression: 4-Expresses basic 75 - 89% of the time/requires cueing 10 - 24% of the time. Needs helper to occlude trach/needs to repeat words.  Social Interaction Social Interaction: 3-Interacts appropriately 50 - 74% of the time - May be physically or verbally inappropriate.  Problem Solving Problem Solving: 4-Solves basic 75 - 89% of the time/requires cueing 10 - 24% of the time  Memory Memory: 3-Recognizes or recalls 50 - 74% of the time/requires cueing 25 - 49% of the time  Medical Problem List and Plan:  1. DVT Prophylaxis/Anticoagulation: Pharmaceutical: Lovenox  2. Pain Management: N/A  3.  Mood:Will monitor and provide ego support. LCSW to follow along.  4. Neuropsych: This patient is capable of making decisions on his own behalf.  5. Liver abscess: Continue abx D #16/21.     -CT today. Drain out soon? 6. CM: continue coreg bid.  7.  Urinary retention, increase cardura to 4mg . bp's ok at present  -urine culture negative  -discussed OOB to void, double voids etc    LOS (Days) 9 A FACE TO FACE EVALUATION WAS PERFORMED  Chaquita Basques T 03/21/2013, 8:50 AM

## 2013-03-21 NOTE — Discharge Summary (Signed)
Physician Discharge Summary  Patient ID: Brad Harrell MRN: [id number] DOB/AGE: Oct 29, 1946 67 y.o.  Admit date: 03/12/2013 Discharge date: 03/21/2013  Discharge Diagnoses:  Principal Problem:   Physical deconditioning Active Problems:   Hypertension   Stroke   Liver abscess   Anemia   Bacteremia   Discharged Condition: Good  Significant Diagnostic Studies: Ct Abdomen W Contrast  03/21/2013   *RADIOLOGY REPORT*  Clinical Data: Follow up liver abscess  CT ABDOMEN WITH CONTRAST  Technique:  Multidetector CT imaging of the abdomen was performed following the standard protocol during bolus administration of intravenous contrast.  Contrast: OMNIPAQUE IOHEXOL 300 MG/ML  SOLN  Comparison: 03/14/2013  Findings:  There are small bilateral pleural effusions identified.  The percutaneous pigtail drainage catheter is identified with pigtail situated and left hepatic lobe abscess.  The abscess measures 5.5 by 4.9 cm, image 14/series 2. Previously this measured 6.2 x 5.9 cm.  The gallbladder is normal.  There is no biliary dilatation.  The pancreas is unremarkable.  Normal appearance of the spleen.  The adrenal glands are both normal.  The right kidney is normal. The left kidney is normal.  There is calcified atherosclerotic disease affecting the abdominal aorta.  No aneurysm.  Mild hazy soft tissues surrounding the abdominal aorta appears similar to previous exam.  Multiple sub centimeter retroperitoneal lymph nodes are identified.  There is no adenopathy.  IMPRESSION:  1.  Decrease in size of the left hepatic lobe abscess. Abscess continues to measure abscess now measures 5.5 x 4.9 cm.  2.  Bilateral pleural effusions.   Original Report Authenticated By: Signa Kell, M.D.    Labs:  Basic Metabolic Panel:  Recent Labs Lab 03/17/13 0535 03/19/13 0500  NA 138  --   K 3.8  --   CL 104  --   CO2 24  --   GLUCOSE 99  --   BUN 8  --   CREATININE 0.88 0.87  CALCIUM 8.6  --     CBC: No  results found for this basename: WBC, NEUTROABS, HGB, HCT, MCV, PLT,  in the last 168 hours  CBG: No results found for this basename: GLUCAP,  in the last 168 hours  Brief HPI:   Brad Harrell is a 67 y.o. male with history of CVA with left hemiparesis, non-ischemic CM, h/o cocaine abuse; admitted on 03/04/13 with complaints of abdominal pain, N/V, right facial weakness and lethargy. MRI with remote right thalamic infarct and right superior frontal infarct and no acute findings. CT abdomen revealed liver abscess. 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  Infectious disease recommended IV antibiotics at least 21 days with follow up CT to see if treatment completed. IV Unasyn changed to IV rocephin/po flagyl at time of discharge. Endoscopy/colonoscopy done by Dr. Elnoria Howard. EGD done revealing gastric erosions and non-bleeding upper duodenal AVMs. Colonoscopy done and polyp biopsied with recommendations to avoid anticoagulation   Hospital Course: Brad Harrell was admitted to rehab 03/12/2013 for inpatient therapies to consist of PT, ST and OT at least three hours five days a week. Past admission physiatrist, therapy team and rehab RN have worked together to provide customized collaborative inpatient rehab. Anticoagulation was held during the stay.  CBC at admission revealed H/H stable at 9.5. Po intake as been good and no recurrent GI distress noted during this stay. Drains have been emptied on bid basis. Foley was discontinued past admission and Cardura increased to 4 mg to  help with voiding function.  Family education was done with daughter regarding drain care past discharge. IVR has followed for input and recommended keeping drain in for now. They will follow up with patient past discharge. He was changed over to IV rocephin and is to follow up with Dr. Orvan Falconer on 03/27/13 for final  decision regarding antibiotic regimen.    Rehab course: During patient's  stay in rehab weekly team conferences were held to monitor patient's progress, set goals and discuss barriers to discharge. Occupational therapy has focused on neuromuscular reeducation of LUE as well as ADL tasks and endurance. Patient requires supervision to min assist for bathing and dressing. He requires steady assist for toilet and tub bench tranfers due to impulsivity. He is capable of performing them with supervision if he takes his time. He has showed improvement in activity tolerance, balance, postural control, increased strength and functional use of LLE. He requires supervision for transfers, stair navigation, car transfers and ambulation with use of RW. Berg Balance score = 18/56. He will continue to receive HHPT and RN past discharge.    Disposition: 01-Home or Self Care   Diet: Regular  Special Instructions: 1. Flush drain with 5 cc sterile water twice a day. Record drainage.  2. [facility] to provide PT for mobility and [facility] for wound care/IV antibiotics.    Future Appointments Provider Department Dept Phone   03/27/2013 10:30 AM Cliffton Asters, MD [facility] for Infectious Disease [phone]   04/11/2013 11:00 AM [facility]rick Colace[facility] [phone]       Medication List    STOP taking these medications       aspirin 81 MG chewable tablet     clopidogrel 75 MG tablet  Commonly known as:  PLAVIX     simvastatin 40 MG tablet  Commonly known as:  ZOCOR      TAKE these medications       carvedilol 3.125 MG tablet  Commonly known as:  COREG  Take 2 tablets (6.25 mg total) by mouth 2 (two) times daily with a meal.     dextrose 5 % SOLN 50 mL with cefTRIAXone 2 G SOLR 2 g  Inject 2 g into the vein daily.     doxazosin 4 MG tablet  Commonly known as:  CARDURA  Take 4 mg by mouth at bedtime.     DSS 100 MG Caps  Take 100 mg by mouth 2 (two) times daily.     levothyroxine 100 MCG tablet  Commonly known as:   SYNTHROID, LEVOTHROID  Take 100 mcg by mouth daily.     metroNIDAZOLE 500 MG tablet  Commonly known as:  FLAGYL  Take 1 tablet (500 mg total) by mouth every 8 (eight) hours.     multivitamin with minerals Tabs  Take 1 tablet by mouth daily.     polyethylene glycol packet  Commonly known as:  MIRALAX / GLYCOLAX  Take 17 g by mouth 2 (two) times daily. For constipation     saccharomyces boulardii 250 MG capsule  Commonly known as:  FLORASTOR  Take 1 capsule (250 mg total) by mouth 2 (two) times daily. Probiotic           Follow-up Information   Follow up with Erick Colace, MD On 04/11/2013. (Be there at 10:45 am for 11 am appointment )    Contact information:   [address] [place] [address] [phone]  Follow up with Theda Belfast, MD. Call today. (for follow up appointment and clearance to resume aspirin and plavix. )    Contact information:   [address] [place] [address] [phone]       Follow up with Cliffton Asters, MD On 03/27/2013. (Be there at 10:15 for 10:30 appointment)    Contact information:   [address] [place] [address] [phone]       Follow up with Dorrene German, MD On 04/04/2013. (Appointment at 3 pm)    Contact information:   [address] [place] [address] [phone]       Signed: Jacquelynn Cree 03/21/2013, 3:07 PM

## 2013-03-21 NOTE — Plan of Care (Signed)
Problem: RH BLADDER ELIMINATION Goal: RH STG MANAGE BLADDER WITH ASSISTANCE STG Manage Bladder With Mod Assistance  Outcome: Not Met (add Reason) Pt incontinent at times of bladder. Brief inplace

## 2013-03-27 ENCOUNTER — Encounter: Payer: Self-pay | Admitting: Internal Medicine

## 2013-03-27 ENCOUNTER — Telehealth: Payer: Self-pay | Admitting: *Deleted

## 2013-03-27 ENCOUNTER — Ambulatory Visit (INDEPENDENT_AMBULATORY_CARE_PROVIDER_SITE_OTHER): Payer: Medicare Other | Admitting: Internal Medicine

## 2013-03-27 VITALS — BP 132/85 | HR 96 | Temp 98.0°F | Ht 65.0 in | Wt 183.0 lb

## 2013-03-27 DIAGNOSIS — K750 Abscess of liver: Secondary | ICD-10-CM

## 2013-03-27 LAB — COMPREHENSIVE METABOLIC PANEL
ALT: 17 U/L (ref 0–53)
Alkaline Phosphatase: 75 U/L (ref 39–117)
CO2: 25 mEq/L (ref 19–32)
Creat: 0.98 mg/dL (ref 0.50–1.35)
Sodium: 140 mEq/L (ref 135–145)
Total Bilirubin: 0.4 mg/dL (ref 0.3–1.2)
Total Protein: 7.3 g/dL (ref 6.0–8.3)

## 2013-03-27 LAB — CBC
MCH: 26.5 pg (ref 26.0–34.0)
MCHC: 32 g/dL (ref 30.0–36.0)
MCV: 83 fL (ref 78.0–100.0)
Platelets: 210 10*3/uL (ref 150–400)
RBC: 4.07 MIL/uL — ABNORMAL LOW (ref 4.22–5.81)

## 2013-03-27 MED ORDER — AMOXICILLIN 500 MG PO CAPS: 500.0000 mg | ORAL_CAPSULE | Freq: Three times a day (TID) | ORAL | Status: DC

## 2013-03-27 NOTE — Progress Notes (Signed)
RN received verbal order to discontinue the patient's PICC line.  Patient identified with name and date of birth. PICC dressing removed, site unremarkable.  PICC line removed using sterile procedure @ 1100. PICC length equal to that noted in patient's hospital chart of 45 cm. Sterile petroleum gauze + sterile 4X4 applied to PICC site, pressure applied for 10 minutes and covered with Medipore tape as a pressure dressing. Patient tolerated procedure without complaints.  Patient instructed to limit use of arm for 1 hour. Patient instructed that the pressure dressing should remain in place for 24 hours. Patient/daughter verbalized understanding of these instructions.

## 2013-03-27 NOTE — Progress Notes (Signed)
Patient ID: Brad Harrell, male   DOB: 04-10-1946, 67 y.o.   MRN: [id number]         [facility] for Infectious Disease  Patient Active Problem List   Diagnosis Date Noted  . Physical deconditioning 03/13/2013  . Anemia, iron deficiency 03/08/2013  . Bacteremia 03/06/2013  . Liver abscess 03/05/2013  . Encephalopathy, metabolic 03/05/2013  . Anemia 03/05/2013  . Complicated UTI (urinary tract infection) 08/15/2012  . SIRS (systemic inflammatory response syndrome) 08/15/2012  . ARF (acute renal failure) 08/15/2012  . Hypertension   . Urinary retention   . Stroke   . Hyperlipidemia   . Cardiomyopathy   . H/O cocaine abuse     Patient's Medications  New Prescriptions   AMOXICILLIN (AMOXIL) 500 MG CAPSULE    Take 1 capsule (500 mg total) by mouth 3 (three) times daily.  Previous Medications   CARVEDILOL (COREG) 3.125 MG TABLET    Take 2 tablets (6.25 mg total) by mouth 2 (two) times daily with a meal.   DOCUSATE SODIUM 100 MG CAPS    Take 100 mg by mouth 2 (two) times daily.   DOXAZOSIN (CARDURA) 4 MG TABLET    Take 4 mg by mouth at bedtime.   LEVOTHYROXINE (SYNTHROID, LEVOTHROID) 100 MCG TABLET    Take 100 mcg by mouth daily.   METRONIDAZOLE (FLAGYL) 500 MG TABLET    Take 1 tablet (500 mg total) by mouth every 8 (eight) hours.   MULTIPLE VITAMIN (MULTIVITAMIN WITH MINERALS) TABS    Take 1 tablet by mouth daily.   POLYETHYLENE GLYCOL (MIRALAX / GLYCOLAX) PACKET    Take 17 g by mouth 2 (two) times daily. For constipation   SACCHAROMYCES BOULARDII (FLORASTOR) 250 MG CAPSULE    Take 1 capsule (250 mg total) by mouth 2 (two) times daily. Probiotic  Modified Medications   No medications on file  Discontinued Medications   DEXTROSE 5 % SOLN 50 ML WITH CEFTRIAXONE 2 G SOLR 2 G    Inject 2 g into the vein daily.    Subjective: Brad Harrell is in for his hospital followup visit. He was seen by my partner, Dr. Ninetta Lights, earlier this month after he was found to have a large left  lobe of liver abscess. He was treated with IV Unasyn. A percutaneous drain was placed on May 7. Abscess cultures grew microaerophilic streptococci and Bacteroides caccae. Drain output was minimal toward the tail end of his hospitalization. He had a repeat CT scan on May 23. The abscess was smaller but still measured 5.5 x 4.9 cm. No comment is available in any of his notes about a decision about drain removal repeat scanning. He is still having minimal output from the drain each day. He is feeling much better. He is no longer sleepy and lethargic like he was upon admission and his abdominal pain has resolved. His daughter states that his appetite has returned to normal. He has not had any further fever. He has had no problems tolerating his PICC. His antibiotic therapy was changed to once daily ceftriaxone and oral metronidazole on discharge. He is tolerating the antibiotics well.  Review of Systems: Pertinent items are noted in HPI.  Past Medical History  Diagnosis Date  . Hypertension   . Urinary retention   . Hyperlipidemia   . Cardiomyopathy   . H/O cocaine abuse   . Hypothyroidism   . Stroke     "weaker on my left side since" (03/04/2013)  . Liver abscess  measuring 7 x 9 cm/notes 03/04/2013  . Anemia     Iron Defficiency    History  Substance Use Topics  . Smoking status: Former Smoker -- 1.00 packs/day for 51 years    Types: Cigarettes  . Smokeless tobacco: Not on file     Comment: 03/04/2013 "quit smoking cigarettes over 2 yr ago"  . Alcohol Use: Yes     Comment: 03/04/2013 "last alcohol was ~ 2 yr ago too"    No family history on file.  No Known Allergies  Objective: Temp: 98 F (36.7 C) (05/29 1033) Temp src: Oral (05/29 1033) BP: 132/85 mmHg (05/29 1033) Pulse Rate: 96 (05/29 1033)  General: He is seated in a wheelchair and in good spirits  Skin:  Left arm PICC site appears normal Lungs: Clear Cor: Regular S1 and S2 no murmurs Abdomen: Soft and nontender. He has an  anterior, right upper quadrant drain. There is less than 10 cc of bloody fluid in the drain bulb. His daughter is not empty the drain bulb since yesterday morning.   BMET    Component Value Date/Time   NA 138 03/17/2013 0535   K 3.8 03/17/2013 0535   CL 104 03/17/2013 0535   CO2 24 03/17/2013 0535   GLUCOSE 99 03/17/2013 0535   BUN 8 03/17/2013 0535   CREATININE 0.87 03/19/2013 0500   CALCIUM 8.6 03/17/2013 0535   GFRNONAA 86* 03/19/2013 0500   GFRAA >90 03/19/2013 0500   Lab Results  Component Value Date   WBC 6.1 03/13/2013   HGB 9.5* 03/13/2013   HCT 29.5* 03/13/2013   MCV 83.1 03/13/2013   PLT 236 03/13/2013   CT ABDOMEN WITH CONTRAST 03/21/2013  Comparison: 03/14/2013   IMPRESSION:  1. Decrease in size of the left hepatic lobe abscess. Abscess  continues to measure abscess now measures 5.5 x 4.9 cm.  2. Bilateral pleural effusions.  Original Report Authenticated By: Signa Kell, M.D.     Assessment: He is clinically improved after 24 days of antibiotic therapy for his polymicrobial liver abscess. I will discontinue the ceftriaxone and have the PICC removed in continue antibiotic therapy for now with oral amoxicillin and metronidazole pending repeat CT scan. If he has persistent abscess I have asked the radiologist to consider repositioning of the drain versus upsizing of the drain.  Plan: 1. Repeat abdominal CT scan 2. Repeat CBC and complete metabolic panel today 3. Removed PICC and discontinue ceftriaxone 4. Start amoxicillin 500 mg 3 times daily along with metronidazole 500 mg 3 times daily 5. Followup in 2 weeks   Cliffton Asters, MD [facility] for Infectious Disease [facility] [phone] pager   [phone] cell 03/27/2013, 11:08 AM

## 2013-03-27 NOTE — Telephone Encounter (Signed)
Abdominal CT scheduled for Mon., June 2 @ 1:30 pm, [employer], [address], [address].  To pick up contrast liquid from [facility] today or tomorrow w/ instructions.  Verbalized back understanding.

## 2013-03-28 ENCOUNTER — Other Ambulatory Visit: Payer: Self-pay | Admitting: Internal Medicine

## 2013-03-28 DIAGNOSIS — K750 Abscess of liver: Secondary | ICD-10-CM

## 2013-03-31 ENCOUNTER — Ambulatory Visit
Admission: RE | Admit: 2013-03-31 | Discharge: 2013-03-31 | Disposition: A | Discharge status: Home / Self Care | Payer: Medicare Other | Source: Ambulatory Visit | Attending: Internal Medicine | Admitting: Internal Medicine

## 2013-03-31 DIAGNOSIS — K750 Abscess of liver: Secondary | ICD-10-CM

## 2013-03-31 MED ORDER — IOHEXOL 300 MG/ML  SOLN
100.0000 mL | Freq: Once | INTRAMUSCULAR | Status: AC | PRN
  Administered 2013-03-31: 100 mL via INTRAVENOUS

## 2013-04-10 ENCOUNTER — Ambulatory Visit: Payer: Medicare Other | Admitting: Internal Medicine

## 2013-04-11 ENCOUNTER — Encounter: Payer: Self-pay | Admitting: Physical Medicine & Rehabilitation

## 2013-04-11 ENCOUNTER — Ambulatory Visit (HOSPITAL_BASED_OUTPATIENT_CLINIC_OR_DEPARTMENT_OTHER): Payer: Medicare Other | Admitting: Physical Medicine & Rehabilitation

## 2013-04-11 ENCOUNTER — Encounter: Payer: Medicare Other | Attending: Physical Medicine & Rehabilitation

## 2013-04-11 VITALS — BP 125/82 | HR 92 | Resp 14 | Ht 65.0 in | Wt 183.0 lb

## 2013-04-11 DIAGNOSIS — I69354 Hemiplegia and hemiparesis following cerebral infarction affecting left non-dominant side: Secondary | ICD-10-CM

## 2013-04-11 DIAGNOSIS — R5381 Other malaise: Secondary | ICD-10-CM

## 2013-04-11 DIAGNOSIS — R300 Dysuria: Secondary | ICD-10-CM | POA: Insufficient documentation

## 2013-04-11 DIAGNOSIS — E039 Hypothyroidism, unspecified: Secondary | ICD-10-CM | POA: Insufficient documentation

## 2013-04-11 DIAGNOSIS — I69959 Hemiplegia and hemiparesis following unspecified cerebrovascular disease affecting unspecified side: Secondary | ICD-10-CM

## 2013-04-11 DIAGNOSIS — E785 Hyperlipidemia, unspecified: Secondary | ICD-10-CM | POA: Insufficient documentation

## 2013-04-11 DIAGNOSIS — M6250 Muscle wasting and atrophy, not elsewhere classified, unspecified site: Secondary | ICD-10-CM | POA: Insufficient documentation

## 2013-04-11 DIAGNOSIS — I10 Essential (primary) hypertension: Secondary | ICD-10-CM | POA: Insufficient documentation

## 2013-04-11 DIAGNOSIS — Z79899 Other long term (current) drug therapy: Secondary | ICD-10-CM | POA: Insufficient documentation

## 2013-04-11 NOTE — Progress Notes (Signed)
Subjective:    Patient ID: Brad Harrell, male    DOB: [birth year], 67 y.o.   MRN: [id number]  HPI Brad Harrell is a 67 y.o. male with history of CVA with left hemiparesis, non-ischemic CM, h/o cocaine abuse; admitted on 03/04/13 with complaints of abdominal pain, N/V, right facial weakness and lethargy. MRI with remote right thalamic infarct and right superior frontal infarct and no acute findings. CT abdomen revealed liver abscess. 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 Infectious disease recommended IV antibiotics at least 21 days with follow up CT to see if treatment completed. IV Unasyn changed to IV rocephin/po flagyl at time of discharge. Endoscopy/colonoscopy done by Dr. Elnoria Howard. EGD done revealing gastric erosions and non-bleeding upper duodenal AVMs  Back to baseline  Bathing, walking to table to eat  Has had ID, PCP and ID follow up PICC out Pain Inventory Average Pain 0 Pain Right Now 0 My pain is no pain  In the last 24 hours, has pain interfered with the following? General activity 0 Relation with others 0 Enjoyment of life 0 What TIME of day is your pain at its worst? no pain Sleep (in general) Good  Pain is worse with: no pain Pain improves with: no pain Relief from Meds: no pain  Mobility walk with assistance do you drive?  no  Function disabled: date disabled . I need assistance with the following:  household duties and shopping  Neuro/Psych bladder control problems weakness trouble walking  Prior Studies Any changes since last visit?  no  Physicians involved in your care Any changes since last visit?  no   History reviewed. No pertinent family history. History   Social History  . Marital Status: Married    Spouse Name: N/A    Number of Children: N/A  . Years of Education: N/A   Social History Main Topics  . Smoking status: Former Smoker -- 1.00 packs/day for 51 years    Types: Cigarettes   . Smokeless tobacco: None     Comment: 03/04/2013 "quit smoking cigarettes over 2 yr ago"  . Alcohol Use: Yes     Comment: 03/04/2013 "last alcohol was ~ 2 yr ago too"  . Drug Use: Yes    Special: Cocaine     Comment: 03/04/2013 "last cocaine was 2 yr ago"  . Sexually Active: Not Currently   Other Topics Concern  . None   Social History Narrative  . None   Past Surgical History  Procedure Laterality Date  . Appendectomy    . Inguinal hernia repair Right   . Tibia fracture surgery Left   . Colonoscopy N/A 03/11/2013    Procedure: COLONOSCOPY;  Surgeon: Theda Belfast, MD;  Location: [facility] ENDOSCOPY;  Service: Endoscopy;  Laterality: N/A;  . Esophagogastroduodenoscopy N/A 03/11/2013    Procedure: ESOPHAGOGASTRODUODENOSCOPY (EGD);  Surgeon: Theda Belfast, MD;  Location: [facility] ENDOSCOPY;  Service: Endoscopy;  Laterality: N/A;  . Givens capsule study N/A 03/12/2013    Procedure: GIVENS CAPSULE STUDY;  Surgeon: Theda Belfast, MD;  Location: [facility] ENDOSCOPY;  Service: Endoscopy;  Laterality: N/A;   Past Medical History  Diagnosis Date  . Hypertension   . Urinary retention   . Hyperlipidemia   . Cardiomyopathy   . H/O cocaine abuse   . Hypothyroidism   . Stroke     "weaker on my left side since" (03/04/2013)  . Liver abscess     measuring 7 x 9 cm/notes 03/04/2013  .  Anemia     Iron Defficiency   BP 125/82  Pulse 92  Resp 14  Ht 5\' 5"  (1.651 m)  Wt 183 lb (83.008 kg)  BMI 30.45 kg/m2  SpO2 97%   Review of Systems  Genitourinary: Positive for difficulty urinating.  Musculoskeletal: Positive for gait problem.  Neurological: Positive for weakness.  All other systems reviewed and are negative.       Objective:   Physical Exam  Left upper extremity 4 minus/5 in the deltoid, biceps, triceps and grip Positive dysdiadochokinesis and left upper extremity with repeated supination pronation. Right upper extremity is 5 minus/5 in the deltoid, biceps, triceps, grip Bilateral extremity is  your 4/5 in hip flexors knee extensors ankle dorsiflexor plantar flexor  Upper extremity is no evidence of swelling. No pain to palpation.      Assessment & Plan:  1.  Deconditioning superimposed on chronic L hemiparesis secondary to remote CVA.  Pt back to baseline.  No further PMR services needed at thsi time Followup PCP Followup GI Followup ID

## 2013-04-14 ENCOUNTER — Ambulatory Visit (INDEPENDENT_AMBULATORY_CARE_PROVIDER_SITE_OTHER): Payer: Medicare Other | Admitting: Internal Medicine

## 2013-04-14 ENCOUNTER — Encounter: Payer: Self-pay | Admitting: Internal Medicine

## 2013-04-14 VITALS — BP 119/82 | HR 98 | Temp 97.9°F | Ht 65.0 in | Wt 176.2 lb

## 2013-04-14 DIAGNOSIS — K750 Abscess of liver: Secondary | ICD-10-CM

## 2013-04-14 NOTE — Progress Notes (Signed)
Patient ID: Brad Harrell, male   DOB: 18-Nov-1945, 67 y.o.   MRN: [id number]         [facility] for Infectious Disease  Patient Active Problem List   Diagnosis Date Noted  . Physical deconditioning 03/13/2013  . Anemia, iron deficiency 03/08/2013  . Bacteremia 03/06/2013  . Liver abscess 03/05/2013  . Encephalopathy, metabolic 03/05/2013  . Anemia 03/05/2013  . Complicated UTI (urinary tract infection) 08/15/2012  . SIRS (systemic inflammatory response syndrome) 08/15/2012  . ARF (acute renal failure) 08/15/2012  . Hypertension   . Urinary retention   . Stroke   . Hyperlipidemia   . Cardiomyopathy   . H/O cocaine abuse     Patient's Medications  New Prescriptions   No medications on file  Previous Medications   CARVEDILOL (COREG) 3.125 MG TABLET    Take 2 tablets (6.25 mg total) by mouth 2 (two) times daily with a meal.   DOCUSATE SODIUM 100 MG CAPS    Take 100 mg by mouth 2 (two) times daily.   DOXAZOSIN (CARDURA) 4 MG TABLET    Take 4 mg by mouth at bedtime.   LEVOTHYROXINE (SYNTHROID, LEVOTHROID) 100 MCG TABLET    Take 100 mcg by mouth daily.   MULTIPLE VITAMIN (MULTIVITAMIN WITH MINERALS) TABS    Take 1 tablet by mouth daily.   POLYETHYLENE GLYCOL (MIRALAX / GLYCOLAX) PACKET    Take 17 g by mouth 2 (two) times daily. For constipation   SACCHAROMYCES BOULARDII (FLORASTOR) 250 MG CAPSULE    Take 1 capsule (250 mg total) by mouth 2 (two) times daily. Probiotic  Modified Medications   No medications on file  Discontinued Medications   AMOXICILLIN (AMOXIL) 500 MG CAPSULE    Take 1 capsule (500 mg total) by mouth 3 (three) times daily.   METRONIDAZOLE (FLAGYL) 500 MG TABLET    Take 1 tablet (500 mg total) by mouth every 8 (eight) hours.    Subjective: Brad Harrell is in for his routine visit. He is now completed 6 weeks of antibiotic therapy for his polymicrobial liver abscess. Overall he is feeling much better. He has not had any fever, chills or sweats. His appetite  has improved. He is having occasional loose stools about once daily which is unchanged since starting antibiotics in the hospital. He has not had an abdominal pain, nausea or vomiting. He has had minimal drain output since his last visit. His daughter continues to flush the catheter.  Review of Systems: Pertinent items are noted in HPI.  Past Medical History  Diagnosis Date  . Hypertension   . Urinary retention   . Hyperlipidemia   . Cardiomyopathy   . H/O cocaine abuse   . Hypothyroidism   . Stroke     "weaker on my left side since" (03/04/2013)  . Liver abscess     measuring 7 x 9 cm/notes 03/04/2013  . Anemia     Iron Defficiency    History  Substance Use Topics  . Smoking status: Former Smoker -- 1.00 packs/day for 51 years    Types: Cigarettes  . Smokeless tobacco: Not on file     Comment: 03/04/2013 "quit smoking cigarettes over 2 yr ago"  . Alcohol Use: Yes     Comment: 03/04/2013 "last alcohol was ~ 2 yr ago too"    No family history on file.  No Known Allergies  Objective: Temp: 97.9 F (36.6 C) (06/16 1553) Temp src: Oral (06/16 1553) BP: 119/82 mmHg (06/16 1553) Pulse  Rate: 98 (06/16 1553)  General: He is in no distress reading a magazine. Abdomen: Soft and nontender. There only a few cc of bloody drainage in his drain bulb over the past 24 hours  CT ABDOMEN WITH CONTRAST 03/31/2013 Technique: Multidetector CT imaging of the abdomen was performed  following the standard protocol during bolus administration of  intravenous contrast.  Contrast: OMNIPAQUE IOHEXOL 300 MG/ML SOLN  Comparison: CT abdomen pelvis of 03/21/2013 and 03/14/2013  Findings: The lung bases are clear. Cardiomegaly is stable. The  low attenuation liver abscess within the left lobe of liver  measures 4.7 x 4.0 cm compared to prior measurement of 6.0 x 6.1 cm  on 05/16 and 4.9 x 5.5 cm on 05/23. The abscess drainage catheter  again lies within the abscess cavity. No other hepatic lesion  is  seen.  No calcified gallstones are noted. The pancreas is normal in size  and the pancreatic duct is not dilated. The adrenal glands and  spleen are unremarkable. The stomach is moderately distended with  food debris and contrast but no gross abnormality is evident. The  kidneys enhance with no calculus or mass and no hydronephrosis is  seen. Atheromatous change is noted throughout the abdominal aorta.  No acute bony abnormality is seen with degenerative change noted at  L3-4 and L5 S1 levels.   IMPRESSION:  Further decrease in size of the liver abscess with abscess drainage  catheter remaining.   Original Report Authenticated By: Dwyane Dee, M.D.    Assessment: I reviewed CT findings with Dr. Geanie Cooley. Although he still has a measurable abscess cavity the hypodensity seen on previous scans is gone and I suspect that all of the abscess fluid has been drained adequately. I will have his drain removed and I will stop antibiotics now. He knows to call me if he has any signs or symptoms suggesting relapse between now and his followup visit.  Plan: 1. Removed the hepatic drain 2. Discontinue amoxicillin and metronidazole 3. Followup in 4 weeks   Cliffton Asters, MD [facility] for Infectious Disease [facility] [phone] pager   [phone] cell 04/14/2013, 4:27 PM

## 2013-04-15 NOTE — Addendum Note (Signed)
Addended by: Jennet Maduro D on: 04/15/2013 10:51 AM   Modules accepted: Orders

## 2013-04-16 ENCOUNTER — Encounter: Payer: Self-pay | Admitting: Internal Medicine

## 2013-04-25 ENCOUNTER — Encounter: Payer: Self-pay | Admitting: *Deleted

## 2013-04-25 NOTE — Progress Notes (Signed)
Patient ID: Brad Harrell, male   DOB: 1946/04/09, 67 y.o.   MRN: [id number] Daughter states they have not been contacted to schedule liver drain removal.  Order placed at last ov 04/14/13.  Phone call to IR scheduler message left.  Phone call to IR @ [phone].  IR staff member found the order in EPIC.  Will call pt/daughter to schedule liver drain removal.  Daughter informed to expect a call to schedule the removal.  Daughter verbalized understanding.

## 2013-04-28 ENCOUNTER — Ambulatory Visit (HOSPITAL_COMMUNITY)
Admission: RE | Admit: 2013-04-28 | Discharge: 2013-04-28 | Disposition: A | Discharge status: Home / Self Care | Payer: Medicare Other | Source: Ambulatory Visit | Attending: Internal Medicine | Admitting: Internal Medicine

## 2013-04-28 DIAGNOSIS — K750 Abscess of liver: Secondary | ICD-10-CM

## 2013-04-28 DIAGNOSIS — Z4682 Encounter for fitting and adjustment of non-vascular catheter: Secondary | ICD-10-CM | POA: Insufficient documentation

## 2013-04-28 NOTE — Procedures (Signed)
Successful bedside removal of hepatic abscess drainage catheter. No immediate post procedural complications.

## 2013-05-13 ENCOUNTER — Ambulatory Visit (INDEPENDENT_AMBULATORY_CARE_PROVIDER_SITE_OTHER): Payer: Medicare Other | Admitting: Internal Medicine

## 2013-05-13 ENCOUNTER — Encounter: Payer: Self-pay | Admitting: Internal Medicine

## 2013-05-13 VITALS — BP 116/76 | HR 80 | Temp 97.7°F | Ht 65.0 in | Wt 176.2 lb

## 2013-05-13 DIAGNOSIS — K750 Abscess of liver: Secondary | ICD-10-CM

## 2013-05-13 NOTE — Progress Notes (Signed)
Patient ID: Brad Harrell, male   DOB: [birth year], 67 y.o.   MRN: [id number]         [facility] for Infectious Disease  Patient Active Problem List   Diagnosis Date Noted  . Physical deconditioning 03/13/2013  . Anemia, iron deficiency 03/08/2013  . Bacteremia 03/06/2013  . Liver abscess 03/05/2013  . Encephalopathy, metabolic 03/05/2013  . Anemia 03/05/2013  . Complicated UTI (urinary tract infection) 08/15/2012  . SIRS (systemic inflammatory response syndrome) 08/15/2012  . ARF (acute renal failure) 08/15/2012  . Hypertension   . Urinary retention   . Stroke   . Hyperlipidemia   . Cardiomyopathy   . H/O cocaine abuse     Patient's Medications  New Prescriptions   No medications on file  Previous Medications   CARVEDILOL (COREG) 3.125 MG TABLET    Take 2 tablets (6.25 mg total) by mouth 2 (two) times daily with a meal.   DOCUSATE SODIUM 100 MG CAPS    Take 100 mg by mouth 2 (two) times daily.   DOXAZOSIN (CARDURA) 4 MG TABLET    Take 4 mg by mouth at bedtime.   LEVOTHYROXINE (SYNTHROID, LEVOTHROID) 100 MCG TABLET    Take 100 mcg by mouth daily.   MULTIPLE VITAMIN (MULTIVITAMIN WITH MINERALS) TABS    Take 1 tablet by mouth daily.   POLYETHYLENE GLYCOL (MIRALAX / GLYCOLAX) PACKET    Take 17 g by mouth 2 (two) times daily. For constipation   SACCHAROMYCES BOULARDII (FLORASTOR) 250 MG CAPSULE    Take 1 capsule (250 mg total) by mouth 2 (two) times daily. Probiotic  Modified Medications   No medications on file  Discontinued Medications   No medications on file    Subjective: Brad Harrell is in for his routine followup visit. He completed 6 weeks of antibiotics for his liver abscess in June 16. His hepatic drain was removed on June 30. He is feeling much better. His appetite has improved. He's not had any further fever, chills, sweats or abdominal pain.  Review of Systems: Pertinent items are noted in HPI.  Past Medical History  Diagnosis Date  . Hypertension   .  Urinary retention   . Hyperlipidemia   . Cardiomyopathy   . H/O cocaine abuse   . Hypothyroidism   . Stroke     "weaker on my left side since" (03/04/2013)  . Liver abscess     measuring 7 x 9 cm/notes 03/04/2013  . Anemia     Iron Defficiency    History  Substance Use Topics  . Smoking status: Former Smoker -- 1.00 packs/day for 51 years    Types: Cigarettes  . Smokeless tobacco: Not on file     Comment: 03/04/2013 "quit smoking cigarettes over 2 yr ago"  . Alcohol Use: Yes     Comment: 03/04/2013 "last alcohol was ~ 2 yr ago too"    No family history on file.  No Known Allergies  Objective: Temp: 97.7 F (36.5 C) (07/15 1059) Temp src: Oral (07/15 1059) BP: 116/76 mmHg (07/15 1059) Pulse Rate: 80 (07/15 1059)  General: He is in much better spirits today Skin: No rash Lungs: Clear Cor: Regular S1 and S2 with no murmurs Abdomen: Right upper quadrant drain site is healed. His abdomen is soft and nontender Mood and affect normal   Assessment: I strongly suspect that his liver abscess has now been cured.  Plan: 1. Continue observation off of antibiotics 2. He knows to call me right away  if he has any signs of relapse.   Cliffton Asters, MD [facility] for Infectious Disease [facility] [phone] pager   [phone] cell 05/13/2013, 11:16 AM

## 2014-06-30 ENCOUNTER — Emergency Department (HOSPITAL_COMMUNITY): Payer: Medicare Other

## 2014-06-30 ENCOUNTER — Encounter (HOSPITAL_COMMUNITY): Payer: Self-pay | Admitting: Emergency Medicine

## 2014-06-30 ENCOUNTER — Inpatient Hospital Stay (HOSPITAL_COMMUNITY)
Admission: EM | Admit: 2014-06-30 | Discharge: 2014-07-03 | DRG: 638 | Disposition: A | Discharge status: Skilled Nursing Facility | Payer: Medicare Other | Attending: Internal Medicine | Admitting: Internal Medicine

## 2014-06-30 DIAGNOSIS — E1110 Type 2 diabetes mellitus with ketoacidosis without coma: Secondary | ICD-10-CM | POA: Diagnosis present

## 2014-06-30 DIAGNOSIS — F1411 Cocaine abuse, in remission: Secondary | ICD-10-CM | POA: Diagnosis present

## 2014-06-30 DIAGNOSIS — I428 Other cardiomyopathies: Secondary | ICD-10-CM | POA: Diagnosis present

## 2014-06-30 DIAGNOSIS — E875 Hyperkalemia: Secondary | ICD-10-CM | POA: Diagnosis present

## 2014-06-30 DIAGNOSIS — R339 Retention of urine, unspecified: Secondary | ICD-10-CM | POA: Diagnosis present

## 2014-06-30 DIAGNOSIS — H5440 Blindness, one eye, unspecified eye: Secondary | ICD-10-CM | POA: Diagnosis present

## 2014-06-30 DIAGNOSIS — I159 Secondary hypertension, unspecified: Secondary | ICD-10-CM

## 2014-06-30 DIAGNOSIS — R6510 Systemic inflammatory response syndrome (SIRS) of non-infectious origin without acute organ dysfunction: Secondary | ICD-10-CM

## 2014-06-30 DIAGNOSIS — E1010 Type 1 diabetes mellitus with ketoacidosis without coma: Principal | ICD-10-CM | POA: Diagnosis present

## 2014-06-30 DIAGNOSIS — E1310 Other specified diabetes mellitus with ketoacidosis without coma: Secondary | ICD-10-CM

## 2014-06-30 DIAGNOSIS — I429 Cardiomyopathy, unspecified: Secondary | ICD-10-CM

## 2014-06-30 DIAGNOSIS — R531 Weakness: Secondary | ICD-10-CM

## 2014-06-30 DIAGNOSIS — D509 Iron deficiency anemia, unspecified: Secondary | ICD-10-CM | POA: Diagnosis present

## 2014-06-30 DIAGNOSIS — E039 Hypothyroidism, unspecified: Secondary | ICD-10-CM | POA: Diagnosis present

## 2014-06-30 DIAGNOSIS — D696 Thrombocytopenia, unspecified: Secondary | ICD-10-CM | POA: Diagnosis present

## 2014-06-30 DIAGNOSIS — E860 Dehydration: Secondary | ICD-10-CM | POA: Diagnosis present

## 2014-06-30 DIAGNOSIS — N390 Urinary tract infection, site not specified: Secondary | ICD-10-CM | POA: Diagnosis present

## 2014-06-30 DIAGNOSIS — Z8673 Personal history of transient ischemic attack (TIA), and cerebral infarction without residual deficits: Secondary | ICD-10-CM

## 2014-06-30 DIAGNOSIS — Z7982 Long term (current) use of aspirin: Secondary | ICD-10-CM

## 2014-06-30 DIAGNOSIS — I69998 Other sequelae following unspecified cerebrovascular disease: Secondary | ICD-10-CM | POA: Diagnosis not present

## 2014-06-30 DIAGNOSIS — R5381 Other malaise: Secondary | ICD-10-CM

## 2014-06-30 DIAGNOSIS — E785 Hyperlipidemia, unspecified: Secondary | ICD-10-CM | POA: Diagnosis present

## 2014-06-30 DIAGNOSIS — E119 Type 2 diabetes mellitus without complications: Secondary | ICD-10-CM | POA: Diagnosis present

## 2014-06-30 DIAGNOSIS — B9689 Other specified bacterial agents as the cause of diseases classified elsewhere: Secondary | ICD-10-CM | POA: Diagnosis present

## 2014-06-30 DIAGNOSIS — K750 Abscess of liver: Secondary | ICD-10-CM | POA: Diagnosis present

## 2014-06-30 DIAGNOSIS — R29898 Other symptoms and signs involving the musculoskeletal system: Secondary | ICD-10-CM | POA: Diagnosis present

## 2014-06-30 DIAGNOSIS — I158 Other secondary hypertension: Secondary | ICD-10-CM

## 2014-06-30 DIAGNOSIS — I10 Essential (primary) hypertension: Secondary | ICD-10-CM | POA: Diagnosis present

## 2014-06-30 DIAGNOSIS — F1410 Cocaine abuse, uncomplicated: Secondary | ICD-10-CM | POA: Diagnosis present

## 2014-06-30 LAB — CBC
HCT: 36.9 % — ABNORMAL LOW (ref 39.0–52.0)
HEMATOCRIT: 44.1 % (ref 39.0–52.0)
Hemoglobin: 14.9 g/dL (ref 13.0–17.0)
Hemoglobin: 12.3 g/dL — ABNORMAL LOW (ref 13.0–17.0)
MCH: 29.5 pg (ref 26.0–34.0)
MCH: 29.1 pg (ref 26.0–34.0)
MCHC: 33.8 g/dL (ref 30.0–36.0)
MCHC: 33.3 g/dL (ref 30.0–36.0)
MCV: 87.3 fL (ref 78.0–100.0)
MCV: 87.2 fL (ref 78.0–100.0)
Platelets: ADEQUATE 10*3/uL (ref 150–400)
Platelets: 141 10*3/uL — ABNORMAL LOW (ref 150–400)
RBC: 5.05 MIL/uL (ref 4.22–5.81)
RBC: 4.23 MIL/uL (ref 4.22–5.81)
RDW: 12.8 % (ref 11.5–15.5)
RDW: 12.6 % (ref 11.5–15.5)
WBC: 16.2 10*3/uL — ABNORMAL HIGH (ref 4.0–10.5)
WBC: 14.5 10*3/uL — ABNORMAL HIGH (ref 4.0–10.5)

## 2014-06-30 LAB — COMPREHENSIVE METABOLIC PANEL
ALT: 10 U/L (ref 0–53)
ANION GAP: 26 — AB (ref 5–15)
AST: 12 U/L (ref 0–37)
Albumin: 3.2 g/dL — ABNORMAL LOW (ref 3.5–5.2)
Alkaline Phosphatase: 71 U/L (ref 39–117)
BILIRUBIN TOTAL: 1.6 mg/dL — AB (ref 0.3–1.2)
BUN: 17 mg/dL (ref 6–23)
CHLORIDE: 98 meq/L (ref 96–112)
CO2: 11 meq/L — AB (ref 19–32)
Calcium: 9 mg/dL (ref 8.4–10.5)
Creatinine, Ser: 1.29 mg/dL (ref 0.50–1.35)
GFR calc Af Amer: 63 mL/min — ABNORMAL LOW (ref >90)
GFR, EST NON AFRICAN AMERICAN: 55 mL/min — AB (ref >90)
Glucose, Bld: 555 mg/dL (ref 70–99)
Potassium: 6.6 mEq/L (ref 3.7–5.3)
Sodium: 135 mEq/L — ABNORMAL LOW (ref 137–147)
Total Protein: 8 g/dL (ref 6.0–8.3)

## 2014-06-30 LAB — URINE MICROSCOPIC-ADD ON: RBC / HPF: NONE SEEN RBC/hpf (ref <3)

## 2014-06-30 LAB — MRSA PCR SCREENING: MRSA by PCR: NEGATIVE

## 2014-06-30 LAB — GLUCOSE, CAPILLARY
GLUCOSE-CAPILLARY: 128 mg/dL — AB (ref 70–99)
GLUCOSE-CAPILLARY: 197 mg/dL — AB (ref 70–99)
GLUCOSE-CAPILLARY: 527 mg/dL — AB (ref 70–99)
Glucose-Capillary: 447 mg/dL — ABNORMAL HIGH (ref 70–99)
Glucose-Capillary: 327 mg/dL — ABNORMAL HIGH (ref 70–99)
Glucose-Capillary: 556 mg/dL (ref 70–99)
Glucose-Capillary: 242 mg/dL — ABNORMAL HIGH (ref 70–99)
Glucose-Capillary: 280 mg/dL — ABNORMAL HIGH (ref 70–99)

## 2014-06-30 LAB — CBG MONITORING, ED
Glucose-Capillary: 592 mg/dL (ref 70–99)
Glucose-Capillary: 448 mg/dL — ABNORMAL HIGH (ref 70–99)

## 2014-06-30 LAB — URINALYSIS, ROUTINE W REFLEX MICROSCOPIC
BILIRUBIN URINE: NEGATIVE
Nitrite: POSITIVE — AB
PH: 7.5 (ref 5.0–8.0)
Protein, ur: 30 mg/dL — AB
Specific Gravity, Urine: 1.029 (ref 1.005–1.030)
Urobilinogen, UA: 1 mg/dL (ref 0.0–1.0)

## 2014-06-30 LAB — BASIC METABOLIC PANEL
ANION GAP: 15 (ref 5–15)
BUN: 15 mg/dL (ref 6–23)
CHLORIDE: 108 meq/L (ref 96–112)
CO2: 19 meq/L (ref 19–32)
Calcium: 8.9 mg/dL (ref 8.4–10.5)
Creatinine, Ser: 1.03 mg/dL (ref 0.50–1.35)
GFR calc non Af Amer: 72 mL/min — ABNORMAL LOW (ref >90)
GFR, EST AFRICAN AMERICAN: 83 mL/min — AB (ref >90)
Glucose, Bld: 204 mg/dL — ABNORMAL HIGH (ref 70–99)
POTASSIUM: 3.8 meq/L (ref 3.7–5.3)
Sodium: 142 mEq/L (ref 137–147)

## 2014-06-30 LAB — LIPID PANEL
CHOL/HDL RATIO: 3.8 ratio
CHOLESTEROL: 115 mg/dL (ref 0–200)
HDL: 30 mg/dL — AB (ref >39)
LDL Cholesterol: 59 mg/dL (ref 0–99)
TRIGLYCERIDES: 130 mg/dL (ref <150)
VLDL: 26 mg/dL (ref 0–40)

## 2014-06-30 LAB — CREATININE, SERUM
Creatinine, Ser: 1.16 mg/dL (ref 0.50–1.35)
GFR calc Af Amer: 72 mL/min — ABNORMAL LOW (ref >90)
GFR calc non Af Amer: 62 mL/min — ABNORMAL LOW (ref >90)

## 2014-06-30 LAB — RAPID URINE DRUG SCREEN, HOSP PERFORMED
AMPHETAMINES: NOT DETECTED
BENZODIAZEPINES: NOT DETECTED
Barbiturates: NOT DETECTED
COCAINE: NOT DETECTED
Opiates: NOT DETECTED
Tetrahydrocannabinol: NOT DETECTED

## 2014-06-30 LAB — TSH: TSH: 0.96 u[IU]/mL (ref 0.350–4.500)

## 2014-06-30 LAB — TROPONIN I

## 2014-06-30 MED ORDER — DEXTROSE 5 % IV SOLN
1.0000 g | INTRAVENOUS | Status: DC
  Administered 2014-07-01 – 2014-07-03 (×3): 1 g via INTRAVENOUS
  Filled 2014-06-30 (×3): qty 10

## 2014-06-30 MED ORDER — DEXTROSE-NACL 5-0.45 % IV SOLN: INTRAVENOUS | Status: DC

## 2014-06-30 MED ORDER — ACETAMINOPHEN 325 MG PO TABS: 650.0000 mg | ORAL_TABLET | Freq: Four times a day (QID) | ORAL | Status: DC | PRN

## 2014-06-30 MED ORDER — SODIUM CHLORIDE 0.9 % IV SOLN
INTRAVENOUS | Status: DC
  Administered 2014-07-01 (×2): via INTRAVENOUS

## 2014-06-30 MED ORDER — SODIUM CHLORIDE 0.9 % IV SOLN
1000.0000 mL | Freq: Once | INTRAVENOUS | Status: AC
  Administered 2014-06-30: 1000 mL via INTRAVENOUS

## 2014-06-30 MED ORDER — PNEUMOCOCCAL VAC POLYVALENT 25 MCG/0.5ML IJ INJ
0.5000 mL | INJECTION | INTRAMUSCULAR | Status: AC
  Administered 2014-07-01: 0.5 mL via INTRAMUSCULAR
  Filled 2014-06-30: qty 0.5

## 2014-06-30 MED ORDER — DOXAZOSIN MESYLATE 8 MG PO TABS
8.0000 mg | ORAL_TABLET | Freq: Every day | ORAL | Status: DC
  Administered 2014-06-30 – 2014-07-02 (×3): 8 mg via ORAL
  Filled 2014-06-30 (×4): qty 1

## 2014-06-30 MED ORDER — SODIUM CHLORIDE 0.9 % IV SOLN
INTRAVENOUS | Status: DC
  Administered 2014-06-30: 3.9 [IU]/h via INTRAVENOUS
  Filled 2014-06-30: qty 2.5

## 2014-06-30 MED ORDER — SIMVASTATIN 40 MG PO TABS
40.0000 mg | ORAL_TABLET | Freq: Every evening | ORAL | Status: DC
  Administered 2014-06-30 – 2014-07-02 (×3): 40 mg via ORAL
  Filled 2014-06-30 (×4): qty 1

## 2014-06-30 MED ORDER — CETYLPYRIDINIUM CHLORIDE 0.05 % MT LIQD
7.0000 mL | Freq: Two times a day (BID) | OROMUCOSAL | Status: DC
  Administered 2014-06-30 – 2014-07-03 (×6): 7 mL via OROMUCOSAL

## 2014-06-30 MED ORDER — ACETAMINOPHEN 650 MG RE SUPP: 650.0000 mg | Freq: Four times a day (QID) | RECTAL | Status: DC | PRN

## 2014-06-30 MED ORDER — LEVOTHYROXINE SODIUM 100 MCG PO TABS
100.0000 ug | ORAL_TABLET | Freq: Every day | ORAL | Status: DC
Start: 2014-07-01 — End: 2014-07-03
  Administered 2014-07-01 – 2014-07-03 (×3): 100 ug via ORAL
  Filled 2014-06-30 (×5): qty 1

## 2014-06-30 MED ORDER — CARVEDILOL 6.25 MG PO TABS
6.2500 mg | ORAL_TABLET | Freq: Two times a day (BID) | ORAL | Status: DC
  Administered 2014-06-30 – 2014-07-03 (×6): 6.25 mg via ORAL
  Filled 2014-06-30 (×8): qty 1

## 2014-06-30 MED ORDER — MORPHINE SULFATE 2 MG/ML IJ SOLN: 2.0000 mg | INTRAMUSCULAR | Status: DC | PRN

## 2014-06-30 MED ORDER — INSULIN REGULAR BOLUS VIA INFUSION
0.0000 [IU] | Freq: Three times a day (TID) | INTRAVENOUS | Status: DC
  Filled 2014-06-30: qty 10

## 2014-06-30 MED ORDER — DEXTROSE 50 % IV SOLN: 25.0000 mL | INTRAVENOUS | Status: DC | PRN

## 2014-06-30 MED ORDER — ASPIRIN EC 81 MG PO TBEC
81.0000 mg | DELAYED_RELEASE_TABLET | Freq: Every day | ORAL | Status: DC
  Administered 2014-06-30 – 2014-07-03 (×4): 81 mg via ORAL
  Filled 2014-06-30 (×4): qty 1

## 2014-06-30 MED ORDER — ENOXAPARIN SODIUM 40 MG/0.4ML ~~LOC~~ SOLN
40.0000 mg | SUBCUTANEOUS | Status: DC
  Administered 2014-06-30 – 2014-07-02 (×3): 40 mg via SUBCUTANEOUS
  Filled 2014-06-30 (×4): qty 0.4

## 2014-06-30 MED ORDER — DEXTROSE-NACL 5-0.45 % IV SOLN
INTRAVENOUS | Status: DC
  Administered 2014-06-30: via INTRAVENOUS

## 2014-06-30 MED ORDER — DEXTROSE 5 % IV SOLN
1.0000 g | Freq: Once | INTRAVENOUS | Status: AC
  Administered 2014-06-30: 1 g via INTRAVENOUS
  Filled 2014-06-30: qty 10

## 2014-06-30 MED ORDER — CLOPIDOGREL BISULFATE 75 MG PO TABS
75.0000 mg | ORAL_TABLET | Freq: Every day | ORAL | Status: DC
  Administered 2014-06-30 – 2014-07-03 (×4): 75 mg via ORAL
  Filled 2014-06-30 (×4): qty 1

## 2014-06-30 MED ORDER — SODIUM CHLORIDE 0.9 % IV SOLN
1000.0000 mL | INTRAVENOUS | Status: DC
  Administered 2014-06-30: 1000 mL via INTRAVENOUS

## 2014-06-30 MED ORDER — SODIUM CHLORIDE 0.9 % IV SOLN
INTRAVENOUS | Status: DC
  Administered 2014-06-30: 3.4 [IU]/h via INTRAVENOUS
  Administered 2014-06-30: 8 [IU]/h via INTRAVENOUS
  Administered 2014-06-30: 7.7 [IU]/h via INTRAVENOUS
  Administered 2014-06-30: 6.9 [IU]/h via INTRAVENOUS
  Administered 2014-06-30: 14 [IU]/h via INTRAVENOUS
  Administered 2014-07-01: 2.9 [IU]/h via INTRAVENOUS
  Administered 2014-07-01: 5 [IU]/h via INTRAVENOUS
  Administered 2014-07-01: 4.7 [IU]/h via INTRAVENOUS
  Filled 2014-06-30: qty 2.5

## 2014-06-30 NOTE — ED Provider Notes (Addendum)
CSN: [id number]     Arrival date & time 06/30/14  2542 History   First MD Initiated Contact with Patient 06/30/14 1023     Chief Complaint  Patient presents with  . Cerebrovascular Accident      HPI Patient presents emergency department for concerns of new left-sided weakness over the past day or 2 as well as some difficulty with speech.  Family is concerned about the possibility of a stroke.  He also reports increased urinary frequency over the past several days.  His blood sugar was noted to be elevated on arrival.  No prior history of diabetes.  Denies fevers chills.  No nausea vomiting or diarrhea.  Denies abdominal pain.  No chest pain or shortness of breath.   Past Medical History  Diagnosis Date  . Hypertension   . Urinary retention   . Hyperlipidemia   . Cardiomyopathy   . H/O cocaine abuse   . Hypothyroidism   . Stroke     "weaker on my left side since" (03/04/2013)  . Liver abscess     measuring 7 x 9 cm/notes 03/04/2013  . Anemia     Iron Defficiency   Past Surgical History  Procedure Laterality Date  . Appendectomy    . Inguinal hernia repair Right   . Tibia fracture surgery Left   . Colonoscopy N/A 03/11/2013    Procedure: COLONOSCOPY;  Surgeon: Beryle Beams, MD;  Location: [facility] ENDOSCOPY;  Service: Endoscopy;  Laterality: N/A;  . Esophagogastroduodenoscopy N/A 03/11/2013    Procedure: ESOPHAGOGASTRODUODENOSCOPY (EGD);  Surgeon: Beryle Beams, MD;  Location: [facility] ENDOSCOPY;  Service: Endoscopy;  Laterality: N/A;  . Givens capsule study N/A 03/12/2013    Procedure: GIVENS CAPSULE STUDY;  Surgeon: Beryle Beams, MD;  Location: [place];  Service: Endoscopy;  Laterality: N/A;   No family history on file. History  Substance Use Topics  . Smoking status: Former Smoker -- 1.00 packs/day for 51 years    Types: Cigarettes  . Smokeless tobacco: Not on file     Comment: 03/04/2013 "quit smoking cigarettes over 2 yr ago"  . Alcohol Use: No     Comment: 03/04/2013 "last  alcohol was ~ 2 yr ago too"    Review of Systems  All other systems reviewed and are negative.     Allergies  Review of patient's allergies indicates no known allergies.  Home Medications   Prior to Admission medications   Medication Sig Start Date End Date Taking? Authorizing Provider  aspirin EC 81 MG tablet Take 81 mg by mouth daily.   Yes Historical Provider, MD  carvedilol (COREG) 3.125 MG tablet Take 2 tablets (6.25 mg total) by mouth 2 (two) times daily with a meal. 03/21/13  Yes Ivan Anchors Love, PA-C  clopidogrel (PLAVIX) 75 MG tablet Take 75 mg by mouth daily.   Yes Historical Provider, MD  doxazosin (CARDURA) 4 MG tablet Take 8 mg by mouth at bedtime.    Yes Historical Provider, MD  levothyroxine (SYNTHROID, LEVOTHROID) 100 MCG tablet Take 100 mcg by mouth daily.   Yes Historical Provider, MD  simvastatin (ZOCOR) 40 MG tablet Take 40 mg by mouth every evening.   Yes Historical Provider, MD   BP 140/80  Pulse 117  Temp(Src) 98.7 F (37.1 C) (Oral)  Resp 21  SpO2 94% Physical Exam  Nursing note and vitals reviewed. Constitutional: He is oriented to person, place, and time. He appears well-developed and well-nourished.  HENT:  Head: Normocephalic and atraumatic.  Eyes: EOM are normal.  Neck: Normal range of motion.  Cardiovascular: Normal rate, regular rhythm, normal heart sounds and intact distal pulses.   Pulmonary/Chest: Effort normal and breath sounds normal. No respiratory distress.  Abdominal: Soft. He exhibits no distension. There is no tenderness.  Musculoskeletal: Normal range of motion.  Mild left arm and left-sided weakness as compared to right.  This is minimal on examination.  No significant facial droop.  Small amount of noted dysarthria.  Neurological: He is alert and oriented to person, place, and time.  Skin: Skin is warm and dry.  Psychiatric: He has a normal mood and affect. Judgment normal.    ED Course  Procedures (including critical care  time)  CRITICAL CARE Performed by: Hoy Morn Total critical care time: 30 Critical care time was exclusive of separately billable procedures and treating other patients. Critical care was necessary to treat or prevent imminent or life-threatening deterioration. Critical care was time spent personally by me on the following activities: development of treatment plan with patient and/or surrogate as well as nursing, discussions with consultants, evaluation of patient's response to treatment, examination of patient, obtaining history from patient or surrogate, ordering and performing treatments and interventions, ordering and review of laboratory studies, ordering and review of radiographic studies, pulse oximetry and re-evaluation of patient's condition.  Labs Review Labs Reviewed  CBC - Abnormal; Notable for the following:    WBC 16.2 (*)    All other components within normal limits  URINALYSIS, ROUTINE W REFLEX MICROSCOPIC - Abnormal; Notable for the following:    APPearance HAZY (*)    Glucose, UA >1000 (*)    Hgb urine dipstick TRACE (*)    Ketones, ur >80 (*)    Protein, ur 30 (*)    Nitrite POSITIVE (*)    Leukocytes, UA TRACE (*)    All other components within normal limits  COMPREHENSIVE METABOLIC PANEL - Abnormal; Notable for the following:    Sodium 135 (*)    Potassium 6.6 (*)    CO2 11 (*)    Glucose, Bld 555 (*)    Albumin 3.2 (*)    Total Bilirubin 1.6 (*)    GFR calc non Af Amer 55 (*)    GFR calc Af Amer 63 (*)    Anion gap 26 (*)    All other components within normal limits  URINE MICROSCOPIC-ADD ON - Abnormal; Notable for the following:    Bacteria, UA MANY (*)    All other components within normal limits  CBG MONITORING, ED - Abnormal; Notable for the following:    Glucose-Capillary 592 (*)    All other components within normal limits  TROPONIN I    Imaging Review Ct Head (brain) Wo Contrast  06/30/2014   CLINICAL DATA:  Facial drooping.  Left-sided  weakness .  EXAM: CT HEAD WITHOUT CONTRAST  TECHNIQUE: Contiguous axial images were obtained from the base of the skull through the vertex without intravenous contrast.  COMPARISON:  CT 03/04/2013, 04/30/2011.  FINDINGS: No intra-axial or extra-axial pathologic fluid or blood collection. No mass lesion. No hydrocephalus. White matter changes noted consistent with chronic ischemia. Diffuse cerebral cerebellar atrophy. Increased density noted over the posterior aspect of the globe. This is most consistent with retinal detachment. Noted is a focal area of radiopacity along the posterior optic globe. This is most likely calcification, possibly related to drusen. Foreign body would be less likely given its appearance. Tumor calcification cannot be excluded . Left maxillary sinus and  diffuse ethmoidal sinus mucosal thickening .  IMPRESSION: 1. Abnormal right optic globe. 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 Clinical correlation suggested as discussed above. 2. Diffuse cerebral atrophy and white matter changes consistent with chronic ischemia.   Electronically Signed   By: Marcello Moores  Register   On: 06/30/2014 11:45   Dg Chest Portable 1 View  06/30/2014   CLINICAL DATA:  Cardiovascular accident.  EXAM: PORTABLE CHEST - 1 VIEW  COMPARISON:  03/04/2013  FINDINGS: Normal heart size and mediastinal contours for technique. No acute infiltrate or edema. No effusion or pneumothorax. No acute osseous findings.  IMPRESSION: No active disease.   Electronically Signed   By: Jorje Guild M.D.   On: 06/30/2014 12:20     EKG Interpretation None      MDM   Final diagnoses:  Diabetic ketoacidosis without coma associated with type 2 diabetes mellitus  Left-sided weakness    Patient is  in diabetic ketoacidosis.  Anion gap is 26.  Started on insulin drip.  Patient has a urinary tract infection.  Rocephin.  Urine culture.  Blood cultures obtained.  Patient is blind in his right eye.  This is baseline for him.  I measures retinal  detachment with a long time ago.    Hoy Morn, MD 06/30/14 [place], MD 06/30/14 [phone]

## 2014-06-30 NOTE — ED Notes (Signed)
Pt remains monitored by blood pressure, pulse ox, and 5 lead. Pts family remains at bedside.  

## 2014-06-30 NOTE — Progress Notes (Signed)
ANTIBIOTIC CONSULT NOTE - INITIAL  Pharmacy Consult for Ceftriaxone Indication: UTI  No Known Allergies  Patient Measurements:     Vital Signs: Temp: 98.7 F (37.1 C) (09/01 1229) Temp src: Oral (09/01 1228) BP: 131/91 mmHg (09/01 1445) Pulse Rate: 117 (09/01 1445) Intake/Output from previous day:   Intake/Output from this shift:    Labs:  Recent Labs  06/30/14 1044 06/30/14 1220  WBC 16.2*  --   HGB 14.9  --   PLT PLATELET CLUMPS NOTED ON SMEAR, COUNT APPEARS ADEQUATE  --   CREATININE  --  1.29   The CrCl is unknown because both a height and weight (above a minimum accepted value) are required for this calculation. No results found for this basename: VANCOTROUGH, VANCOPEAK, VANCORANDOM, GENTTROUGH, GENTPEAK, GENTRANDOM, TOBRATROUGH, TOBRAPEAK, TOBRARND, AMIKACINPEAK, AMIKACINTROU, AMIKACIN,  in the last 72 hours   Microbiology: No results found for this or any previous visit (from the past 720 hour(s)).  Medical History: Past Medical History  Diagnosis Date  . Hypertension   . Urinary retention   . Hyperlipidemia   . Cardiomyopathy   . H/O cocaine abuse   . Hypothyroidism   . Stroke     "weaker on my left side since" (03/04/2013)  . Liver abscess     measuring 7 x 9 cm/notes 03/04/2013  . Anemia     Iron Defficiency    Medications:  Prescriptions prior to admission  Medication Sig Dispense Refill  . aspirin EC 81 MG tablet Take 81 mg by mouth daily.      . carvedilol (COREG) 3.125 MG tablet Take 2 tablets (6.25 mg total) by mouth 2 (two) times daily with a meal.  120 tablet  1  . clopidogrel (PLAVIX) 75 MG tablet Take 75 mg by mouth daily.      Marland Kitchen doxazosin (CARDURA) 4 MG tablet Take 8 mg by mouth at bedtime.       Marland Kitchen levothyroxine (SYNTHROID, LEVOTHROID) 100 MCG tablet Take 100 mcg by mouth daily.      . simvastatin (ZOCOR) 40 MG tablet Take 40 mg by mouth every evening.       Assessment: 68yo male to start ceftriaxone for UTI. Pt is afebrile with  leukocytosis to 16.2 on admit. UA shows positive nitrite, trace leuks, 21-50 WBC, and many bacteria.   Goal of Therapy:  Resolution of infection  Plan:  - Start ceftriaxone 1g q24h - F/u urine cultures  Andrey Cota. Diona Foley, PharmD Clinical Pharmacist Pager [phone] 06/30/2014,4:38 PM

## 2014-06-30 NOTE — ED Notes (Signed)
Pt remains monitored by blood pressure, pulse ox, and 5 lead. pts family remains at bedside.  

## 2014-06-30 NOTE — Progress Notes (Signed)
06/30/2014 patient transfer from the Emergency room to 2 central on [place]. He is alert to person, place, but not aware of time and situation. Patient is blind in the right eye and can see out the left. He have some weakness, but have moderate grip. Patient is able to lift both legs off the bed. Skin is intact, heels are dry. He is incontinent of urine and had a diaper when arrive on 2 central. Diaper was taken off and sacrum was observed. He was placed on monitor and central monitor was called, CHG bath was done, have a hx of MRSA and nasal swab was complete. [facility] RN.

## 2014-06-30 NOTE — H&P (Signed)
[facility] History and Physical  Brad Harrell DOB: [birth year] DOA: 06/30/2014  Referring physician:  PCP: [name], MD  Specialists:   Chief Complaint: Left-sided weakness and fatigue  HPI: Brad Harrell is a 68 y.o. male  With a history of CVA, hypertension, hypothyroidism other presented to the emergency department questionable stroke. Patient with the daughter noted that he was unable to walk this morning and could not communicate appropriately and had some left-sided weakness. For him to the hospital for possible stroke. Upon arrival to emergency department, patient was found to have a blood sugar in the 700. Patient does not have a history of diabetes. Per the daughter, he has been somewhat tired and different since Sunday. He has not been eating or drinking properly. Patient currently denies pain however nonspecific. He does complain of some left-sided weakness. Patient denies any chest pain, shortness of breath, abdominal pain, nausea, vomiting, diarrhea, constipation, sick contacts, recent travel.  Review of Systems:  Constitutional: Complains of poor appetite and generalized fatigue. HEENT: Denies photophobia, eye pain, redness, hearing loss, ear pain, congestion, sore throat, rhinorrhea, sneezing, mouth sores, trouble swallowing, neck pain, neck stiffness and tinnitus.   Respiratory: Denies SOB, DOE, cough, chest tightness,  and wheezing.   Cardiovascular: Denies chest pain, palpitations and leg swelling.  Gastrointestinal: Denies nausea, vomiting, abdominal pain, diarrhea, constipation, blood in stool and abdominal distention.  Genitourinary: Has urinary retention. Musculoskeletal: Complains of pain, nonspecific. Skin: Denies pallor, rash and wound.  Neurological: Denies headache or dizziness. Does complain of left-sided weakness. Hematological: Denies adenopathy. Easy bruising, personal or family bleeding history  Psychiatric/Behavioral: Denies  suicidal ideation, mood changes, confusion, nervousness, sleep disturbance and agitation  Past Medical History  Diagnosis Date  . Hypertension   . Urinary retention   . Hyperlipidemia   . Cardiomyopathy   . H/O cocaine abuse   . Hypothyroidism   . Stroke     "weaker on my left side since" (03/04/2013)  . Liver abscess     measuring 7 x 9 cm/notes 03/04/2013  . Anemia     Iron Defficiency   Past Surgical History  Procedure Laterality Date  . Appendectomy    . Inguinal hernia repair Right   . Tibia fracture surgery Left   . Colonoscopy N/A 03/11/2013    Procedure: COLONOSCOPY;  Surgeon: Beryle Beams, MD;  Location: [facility] ENDOSCOPY;  Service: Endoscopy;  Laterality: N/A;  . Esophagogastroduodenoscopy N/A 03/11/2013    Procedure: ESOPHAGOGASTRODUODENOSCOPY (EGD);  Surgeon: Beryle Beams, MD;  Location: [facility] ENDOSCOPY;  Service: Endoscopy;  Laterality: N/A;  . Givens capsule study N/A 03/12/2013    Procedure: GIVENS CAPSULE STUDY;  Surgeon: Beryle Beams, MD;  Location: [facility];  Service: Endoscopy;  Laterality: N/A;   Social History:  reports that he has quit smoking. His smoking use included Cigarettes. He has a 51 pack-year smoking history. He does not have any smokeless tobacco history on file. He reports that he uses illicit drugs (Cocaine). He reports that he does not drink alcohol. Lives at home with his daughter.  No Known Allergies  No family history on file.  Prior to Admission medications   Medication Sig Start Date End Date Taking? Authorizing Provider  aspirin EC 81 MG tablet Take 81 mg by mouth daily.   Yes Historical Provider, MD  carvedilol (COREG) 3.125 MG tablet Take 2 tablets (6.25 mg total) by mouth 2 (two) times daily with a meal. 03/21/13  Yes Bary Leriche, PA-C  clopidogrel (PLAVIX) 75 MG tablet Take 75 mg by mouth daily.   Yes Historical Provider, MD  doxazosin (CARDURA) 4 MG tablet Take 8 mg by mouth at bedtime.    Yes Historical Provider, MD  levothyroxine  (SYNTHROID, LEVOTHROID) 100 MCG tablet Take 100 mcg by mouth daily.   Yes Historical Provider, MD  simvastatin (ZOCOR) 40 MG tablet Take 40 mg by mouth every evening.   Yes Historical Provider, MD   Physical Exam: Filed Vitals:   06/30/14 1315  BP: 140/80  Pulse: 117  Temp:   Resp: 21     General: Well developed, well nourished, NAD, appears stated age  24: NCAT,Left pupil round and reactive and EOMI, Right blindness, Anicteic Sclera, mucous membranes moist.   Neck: Supple, no JVD, no masses  Cardiovascular: S1 S2 auscultated, no rubs, murmurs or gallops. Regular rate and rhythm.  Respiratory: Clear to auscultation bilaterally with equal chest rise  Abdomen: Soft, nontender, nondistended, + bowel sounds  Extremities: warm dry without cyanosis clubbing or edema  Neuro: AAOx3, in the in the right eye otherwise nerves intact, Strength 3/5 LUE and LLE, 4/5 RUE, RLL, +dysarthria  Skin: Without rashes exudates or nodules  Psych: Normal affect and demeanor with intact judgement and insight  Labs on Admission:  Basic Metabolic Panel:  Recent Labs Lab 06/30/14 1220  NA 135*  K 6.6*  CL 98  CO2 11*  GLUCOSE 555*  BUN 17  CREATININE 1.29  CALCIUM 9.0   Liver Function Tests:  Recent Labs Lab 06/30/14 1220  AST 12  ALT 10  ALKPHOS 71  BILITOT 1.6*  PROT 8.0  ALBUMIN 3.2*   No results found for this basename: LIPASE, AMYLASE,  in the last 168 hours No results found for this basename: AMMONIA,  in the last 168 hours CBC:  Recent Labs Lab 06/30/14 1044  WBC 16.2*  HGB 14.9  HCT 44.1  MCV 87.3  PLT PLATELET CLUMPS NOTED ON SMEAR, COUNT APPEARS ADEQUATE   Cardiac Enzymes:  Recent Labs Lab 06/30/14 1220  TROPONINI <0.30    BNP (last 3 results) No results found for this basename: PROBNP,  in the last 8760 hours CBG:  Recent Labs Lab 06/30/14 1041  GLUCAP 592*    Radiological Exams on Admission: Ct Head (brain) Wo Contrast  06/30/2014    CLINICAL DATA:  Facial drooping.  Left-sided weakness .  EXAM: CT HEAD WITHOUT CONTRAST  TECHNIQUE: Contiguous axial images were obtained from the base of the skull through the vertex without intravenous contrast.  COMPARISON:  CT 03/04/2013, 04/30/2011.  FINDINGS: No intra-axial or extra-axial pathologic fluid or blood collection. No mass lesion. No hydrocephalus. White matter changes noted consistent with chronic ischemia. Diffuse cerebral cerebellar atrophy. Increased density noted over the posterior aspect of the globe. This is most consistent with retinal detachment. Noted is a focal area of radiopacity along the posterior optic globe. This is most likely calcification, possibly related to drusen. Foreign body would be less likely given its appearance. Tumor calcification cannot be excluded . Left maxillary sinus and diffuse ethmoidal sinus mucosal thickening .  IMPRESSION: 1. Abnormal right optic globe. 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 Clinical correlation suggested as discussed above. 2. Diffuse cerebral atrophy and white matter changes consistent with chronic ischemia.   Electronically Signed   By: Marcello Moores  Register   On: 06/30/2014 11:45   Dg Chest Portable 1 View  06/30/2014   CLINICAL DATA:  Cardiovascular accident.  EXAM: PORTABLE CHEST - 1 VIEW  COMPARISON:  03/04/2013  FINDINGS: Normal heart size and mediastinal contours for technique. No acute infiltrate or edema. No effusion or pneumothorax. No acute osseous findings.  IMPRESSION: No active disease.   Electronically Signed   By: Jorje Guild M.D.   On: 06/30/2014 12:20    EKG: Independently reviewed. Sinus tachycardia, rate 118  Assessment/Plan  Diabetic ketoacidosis/increased anion gap metabolic acidosis -Will admit the patient to stepdown unit -Patient has no history of diabetes -Will place patient on glucose stabilizer, IV fluids, conducted BMPs every 2 hours as well as CBG hourly -Obtain hemoglobin A1c -Consult diabetes  coordinator  Left-sided weakness, possible stroke -Patient has a history of stroke -CT of the head: Abnormal right optic globe, pop or retinal attachment. Diffuse cerebral atrophy consistent with chronic ischemia -Will obtain MRI/MRA of the brain, echocardiogram, carotid Doppler, hemoglobin A1c, lipid panel -Will conduct neuro checks -Will consult PT, OT, speech for evaluation and treatment -Will continue aspirin and Plavix as well as statin for secondary prophylaxis  Sepsis secondary to DKA and UTI -Patient is tachycardic a leukocytosis -UA: WBC 2150, many bacteria, positive nitrites -UA and blood cultures pending -Continue ceftriaxone and IV fluids  Hyperkalemia -In the setting of DKA, will likely correct -Continue to monitor BMP  Hypothyroidism -Continue Synthroid, will obtain TSH level  Hyperlipidemia -Continue statin  -Will obtain lipid panel  Hypertension -Patient pressure well-controlled, continue Corgard  Urinary Retention -Continue doxazosin  DVT prophylaxis: Lovenox  Code Status: Full  Condition: Guarded  Family Communication: Daughter at bedside.  Admission, patients condition and plan of care including tests being ordered have been discussed with the patient and daughter who indicate understanding and agree with the plan and Code Status.  Disposition Plan: Admitted    Time spent: 65 minutes  Emre Stock D.O. [facility] Pager [phone]  If 7PM-7AM, please contact night-coverage [email or web address] Password [email or web address] 06/30/2014, 2:21 PM

## 2014-06-30 NOTE — Progress Notes (Signed)
Forrest Moron NP informed of b met results.

## 2014-06-30 NOTE — ED Notes (Signed)
Pt was able to urinate using the urinal in and out cath was not needed for pt.

## 2014-06-30 NOTE — ED Notes (Signed)
Patient's daughter, caregiver, states the last time patient seen normal was Sunday night.   Patient states that he started having additional L sided facial drooping and L sided weakness as of this morning.

## 2014-06-30 NOTE — ED Notes (Signed)
Elevated cbg reported to sara

## 2014-07-01 ENCOUNTER — Inpatient Hospital Stay (HOSPITAL_COMMUNITY): Payer: Medicare Other

## 2014-07-01 DIAGNOSIS — R29898 Other symptoms and signs involving the musculoskeletal system: Secondary | ICD-10-CM | POA: Diagnosis present

## 2014-07-01 DIAGNOSIS — N390 Urinary tract infection, site not specified: Secondary | ICD-10-CM | POA: Diagnosis present

## 2014-07-01 DIAGNOSIS — Z8673 Personal history of transient ischemic attack (TIA), and cerebral infarction without residual deficits: Secondary | ICD-10-CM

## 2014-07-01 DIAGNOSIS — R209 Unspecified disturbances of skin sensation: Secondary | ICD-10-CM

## 2014-07-01 DIAGNOSIS — E119 Type 2 diabetes mellitus without complications: Secondary | ICD-10-CM | POA: Diagnosis present

## 2014-07-01 DIAGNOSIS — I519 Heart disease, unspecified: Secondary | ICD-10-CM

## 2014-07-01 DIAGNOSIS — E1010 Type 1 diabetes mellitus with ketoacidosis without coma: Secondary | ICD-10-CM | POA: Diagnosis not present

## 2014-07-01 DIAGNOSIS — D696 Thrombocytopenia, unspecified: Secondary | ICD-10-CM | POA: Diagnosis present

## 2014-07-01 LAB — BASIC METABOLIC PANEL
ANION GAP: 14 (ref 5–15)
ANION GAP: 13 (ref 5–15)
BUN: 15 mg/dL (ref 6–23)
BUN: 15 mg/dL (ref 6–23)
CALCIUM: 9 mg/dL (ref 8.4–10.5)
CHLORIDE: 112 meq/L (ref 96–112)
CO2: 20 mEq/L (ref 19–32)
CO2: 21 mEq/L (ref 19–32)
CREATININE: 1.01 mg/dL (ref 0.50–1.35)
Calcium: 8.9 mg/dL (ref 8.4–10.5)
Chloride: 111 mEq/L (ref 96–112)
Creatinine, Ser: 0.99 mg/dL (ref 0.50–1.35)
GFR calc non Af Amer: 81 mL/min — ABNORMAL LOW (ref >90)
GFR calc non Af Amer: 73 mL/min — ABNORMAL LOW (ref >90)
GFR, EST AFRICAN AMERICAN: 85 mL/min — AB (ref >90)
Glucose, Bld: 106 mg/dL — ABNORMAL HIGH (ref 70–99)
Glucose, Bld: 113 mg/dL — ABNORMAL HIGH (ref 70–99)
Potassium: 4.3 mEq/L (ref 3.7–5.3)
Potassium: 4 mEq/L (ref 3.7–5.3)
Sodium: 146 mEq/L (ref 137–147)
Sodium: 145 mEq/L (ref 137–147)

## 2014-07-01 LAB — GLUCOSE, CAPILLARY
GLUCOSE-CAPILLARY: 159 mg/dL — AB (ref 70–99)
GLUCOSE-CAPILLARY: 202 mg/dL — AB (ref 70–99)
GLUCOSE-CAPILLARY: 224 mg/dL — AB (ref 70–99)
Glucose-Capillary: 132 mg/dL — ABNORMAL HIGH (ref 70–99)
Glucose-Capillary: 153 mg/dL — ABNORMAL HIGH (ref 70–99)
Glucose-Capillary: 161 mg/dL — ABNORMAL HIGH (ref 70–99)
Glucose-Capillary: 260 mg/dL — ABNORMAL HIGH (ref 70–99)
Glucose-Capillary: 278 mg/dL — ABNORMAL HIGH (ref 70–99)

## 2014-07-01 LAB — HEMOGLOBIN A1C
Hgb A1c MFr Bld: 11.4 % — ABNORMAL HIGH (ref <5.7)
MEAN PLASMA GLUCOSE: 280 mg/dL — AB (ref <117)

## 2014-07-01 LAB — URINE CULTURE
COLONY COUNT: NO GROWTH
CULTURE: NO GROWTH

## 2014-07-01 LAB — CBC
HCT: 35.8 % — ABNORMAL LOW (ref 39.0–52.0)
Hemoglobin: 12.1 g/dL — ABNORMAL LOW (ref 13.0–17.0)
MCH: 28.7 pg (ref 26.0–34.0)
MCHC: 33.8 g/dL (ref 30.0–36.0)
MCV: 85 fL (ref 78.0–100.0)
PLATELETS: 147 10*3/uL — AB (ref 150–400)
RBC: 4.21 MIL/uL — AB (ref 4.22–5.81)
RDW: 12.7 % (ref 11.5–15.5)
WBC: 12.5 10*3/uL — ABNORMAL HIGH (ref 4.0–10.5)

## 2014-07-01 MED ORDER — INSULIN ASPART 100 UNIT/ML ~~LOC~~ SOLN
0.0000 [IU] | Freq: Every day | SUBCUTANEOUS | Status: DC
  Administered 2014-07-01: 3 [IU] via SUBCUTANEOUS

## 2014-07-01 MED ORDER — INSULIN ASPART 100 UNIT/ML ~~LOC~~ SOLN
0.0000 [IU] | Freq: Three times a day (TID) | SUBCUTANEOUS | Status: DC
  Administered 2014-07-01: 3 [IU] via SUBCUTANEOUS
  Administered 2014-07-01: 5 [IU] via SUBCUTANEOUS
  Administered 2014-07-01 – 2014-07-02 (×2): 3 [IU] via SUBCUTANEOUS
  Administered 2014-07-02 – 2014-07-03 (×3): 2 [IU] via SUBCUTANEOUS
  Administered 2014-07-03: 1 [IU] via SUBCUTANEOUS

## 2014-07-01 MED ORDER — INSULIN GLARGINE 100 UNIT/ML ~~LOC~~ SOLN
10.0000 [IU] | Freq: Every day | SUBCUTANEOUS | Status: DC
  Administered 2014-07-01: 10 [IU] via SUBCUTANEOUS
  Filled 2014-07-01 (×2): qty 0.1

## 2014-07-01 MED ORDER — INSULIN GLARGINE 100 UNIT/ML ~~LOC~~ SOLN
16.0000 [IU] | Freq: Every day | SUBCUTANEOUS | Status: DC
  Administered 2014-07-01 – 2014-07-02 (×2): 16 [IU] via SUBCUTANEOUS
  Filled 2014-07-01 (×3): qty 0.16

## 2014-07-01 MED ORDER — INSULIN STARTER KIT- SYRINGES (ENGLISH)
1.0000 | Freq: Once | Status: AC
  Administered 2014-07-03: 1
  Filled 2014-07-01 (×2): qty 1

## 2014-07-01 MED ORDER — INSULIN ASPART 100 UNIT/ML ~~LOC~~ SOLN
4.0000 [IU] | Freq: Three times a day (TID) | SUBCUTANEOUS | Status: DC
Start: 2014-07-02 — End: 2014-07-03
  Administered 2014-07-02 – 2014-07-03 (×5): 4 [IU] via SUBCUTANEOUS

## 2014-07-01 MED ORDER — DEXTROSE 50 % IV SOLN: 25.0000 mL | Freq: Once | INTRAVENOUS | Status: AC | PRN

## 2014-07-01 MED ORDER — GLUCOSE 40 % PO GEL: 1.0000 | ORAL | Status: DC | PRN

## 2014-07-01 MED ORDER — LIVING WELL WITH DIABETES BOOK
Freq: Once | Status: AC
  Administered 2014-07-03: 10:00:00
  Filled 2014-07-01 (×2): qty 1

## 2014-07-01 MED ORDER — DEXTROSE 50 % IV SOLN: 50.0000 mL | Freq: Once | INTRAVENOUS | Status: AC | PRN

## 2014-07-01 NOTE — Progress Notes (Signed)
Received prescreen request for inpatient rehab and have noted that rehab MD consult has already been ordered. We will await completion of rehab consult and an admission coordinator will follow up.  Thank you.  Nanetta Batty, PT Rehabilitation Admissions Coordinator [phone]

## 2014-07-01 NOTE — Progress Notes (Signed)
VASCULAR LAB PRELIMINARY  PRELIMINARY  PRELIMINARY  PRELIMINARY  Carotid duplex completed.    Preliminary report:  Bilateral:  1-39% ICA stenosis.  Vertebral artery flow is antegrade.      Shaleka Brines, RVT 07/01/2014, 4:12 PM

## 2014-07-01 NOTE — Progress Notes (Signed)
Spoke with patient briefly regarding plan for education while in the hospital.  He states that he lives with his daughter but is by himself some while she works.  Will need education to include daughter.  Ordered videos, insulin starter kit, patient education booklet, etc.  Will follow.  MD please consider increasing Lantus to 15 units daily and add Novolog meal coverage 4 units tid with meals.   Thanks, Adah Perl, RN, BC-ADM Inpatient Diabetes Coordinator Pager [phone]

## 2014-07-01 NOTE — Progress Notes (Signed)
  Echocardiogram 2D Echocardiogram has been performed.  Brad Harrell M 07/01/2014, 11:05 AM

## 2014-07-01 NOTE — Evaluation (Signed)
Occupational Therapy Evaluation Patient Details Name: Brad Harrell MRN: [id number] DOB: [birth year] Today's Date: 07/01/2014    History of Present Illness This 68 y.o. male admitted with difficulty ambulating, impaired communication, Lt sided weakness.  In ED he was found to have BS 700+.   CT of head with no acute changes.  MRI pending.   Pt diagnosed with DKA; sepsis secondary to DKA and UTI; hyperkalemia; HTN;  PMH includes:  urinary retention; cardiomyopathy; h/o cocaine abuse; CVA with Lt. sided weakness 03/04/13; liver abcess; anemia   Clinical Impression   Pt admitted with above. She demonstrates the below listed deficits and will benefit from continued OT to maximize safety and independence with BADLs.  Pt presents to OT with mild Lt. UE weakness and dysmetria/ataxia Lt. Side.  He endorses visual changes, but is unable to describe them.  Visual fields intact and pursuits appear to be intact.  He is easily distracted, slow to respond, and provides inconsistencies with providing info. Re: PLOF.   Feel he would be a good candidate for CIR.  He reports his daughters are able to provide 24 hour assist at discharge.  Currently, he requires min - mod A with BADLs.       Follow Up Recommendations  CIR;Supervision/Assistance - 24 hour    Equipment Recommendations  3 in 1 bedside comode    Recommendations for Other Services       Precautions / Restrictions Precautions Precautions: Fall      Mobility Bed Mobility                  Transfers Overall transfer level: Needs assistance Equipment used: Standard walker Transfers: Sit to/from Stand;Stand Pivot Transfers Sit to Stand: Min assist Stand pivot transfers: Min assist       General transfer comment: Pt loses balance to Lt.      Balance Overall balance assessment: Needs assistance Sitting-balance support: Feet supported Sitting balance-Leahy Scale: Good Sitting balance - Comments: sittig in recliner   Standing  balance support: No upper extremity supported Standing balance-Leahy Scale: Poor Standing balance comment: Pt loses balance                             ADL Overall ADL's : Needs assistance/impaired Eating/Feeding: Set up;Sitting   Grooming: Wash/dry face;Wash/dry hands;Oral care;Brushing hair;Set up;Sitting   Upper Body Bathing: Set up;Supervision/ safety;Sitting   Lower Body Bathing: Moderate assistance;Sit to/from stand Lower Body Bathing Details (indicate cue type and reason): due to impaired balance Upper Body Dressing : Minimal assistance;Sitting   Lower Body Dressing: Moderate assistance;Sit to/from stand Lower Body Dressing Details (indicate cue type and reason): Pt able to don/doff socks, with increased time and effort, but requires assist for balance and to pull pants over hips  Toilet Transfer: Moderate assistance;Ambulation;Comfort height toilet   Toileting- Clothing Manipulation and Hygiene: Moderate assistance;Sit to/from stand       Functional mobility during ADLs: Moderate assistance;Rolling walker General ADL Comments: Pt appears ataxic.  He loses balance to Lt. when up requiring assistance.       Vision     Ocular Range of Motion: Within Functional Limits Tracking/Visual Pursuits: Able to track stimulus in all quads without difficulty         Additional Comments: Pt is blind in Rt eye.  He reports his vision has changed with this event.  He is unable to describe the visual changes.  States "I have to rub my  eyes a lot".  He denies blurriness.  During pursuits, he is easily distracted and loses fixation on object.    Perception     Praxis Praxis Praxis tested?: Within functional limits    Pertinent Vitals/Pain Pain Assessment: No/denies pain     Hand Dominance Right   Extremity/Trunk Assessment Upper Extremity Assessment Upper Extremity Assessment: LUE deficits/detail LUE Deficits / Details: Grossly 4/5;  Lt. UE with dysmetria.   Impaired finger to nose LUE Sensation: decreased light touch;decreased proprioception LUE Coordination: decreased fine motor;decreased gross motor   Lower Extremity Assessment Lower Extremity Assessment: Defer to PT evaluation       Communication Communication Communication: No difficulties   Cognition Arousal/Alertness: Awake/alert Behavior During Therapy: WFL for tasks assessed/performed Overall Cognitive Status: Impaired/Different from baseline Area of Impairment: Attention;Safety/judgement;Problem solving   Current Attention Level: Selective (with cues.  Distracts easily ) Memory: Decreased short-term memory   Safety/Judgement: Decreased awareness of safety;Decreased awareness of deficits   Problem Solving: Slow processing;Decreased initiation;Requires verbal cues;Requires tactile cues General Comments: Pt with slow responses; decreased initiation.  Provided inconsistent responses in response to PLOF   General Comments       Exercises       Shoulder Instructions      Home Living Family/patient expects to be discharged to:: Private residence Living Arrangements: Children Available Help at Discharge: Family;Available 24 hours/day               Bathroom Shower/Tub: Tub/shower unit;Curtain Shower/tub characteristics: Architectural technologist: Standard     Home Equipment: Building services engineer Comments: Pt lives with daughter who works during the day.  He reports his other daughter can assist during the day      Prior Functioning/Environment Level of Independence: Independent with assistive device(s)        Comments: Pt initially stated he does not use AD for ambulation, but later in eval, he states he does use RW.   He denies falls, and reports he does not drive    OT Diagnosis: Generalized weakness;Cognitive deficits;Disturbance of vision;Ataxia   OT Problem List: Decreased strength;Decreased activity tolerance;Impaired balance (sitting and/or  standing);Impaired vision/perception;Decreased coordination;Decreased cognition;Decreased safety awareness;Decreased knowledge of use of DME or AE;Impaired UE functional use   OT Treatment/Interventions: Self-care/ADL training;Neuromuscular education;DME and/or AE instruction;Therapeutic activities;Cognitive remediation/compensation;Visual/perceptual remediation/compensation;Patient/family education;Balance training    OT Goals(Current goals can be found in the care plan section) Acute Rehab OT Goals Patient Stated Goal: To get better OT Goal Formulation: With patient Time For Goal Achievement: 07/15/14 Potential to Achieve Goals: Good ADL Goals Pt Will Perform Grooming: standing;with min guard assist Pt Will Perform Lower Body Bathing: with min guard assist;sit to/from stand Pt Will Perform Upper Body Dressing: with supervision;sitting Pt Will Perform Lower Body Dressing: with min guard assist;sit to/from stand Pt Will Transfer to Toilet: with min guard assist;ambulating;regular height toilet;grab bars Pt Will Perform Toileting - Clothing Manipulation and hygiene: with min guard assist;sit to/from stand  OT Frequency: Min 2X/week   Barriers to D/C:            Co-evaluation              End of Session Equipment Utilized During Treatment: Surveyor, mining Communication: Mobility status  Activity Tolerance: Patient tolerated treatment well Patient left: in chair;with call bell/phone within reach   Time: 1208-1239 OT Time Calculation (min): 31 min Charges:  OT General Charges $OT Visit: 1 Procedure OT Treatments $Self Care/Home Management : 8-22 mins $Therapeutic Activity: 8-22 mins  G-Codes:    Lucille Passy M 07/01/2014, 1:00 PM

## 2014-07-01 NOTE — Evaluation (Signed)
Physical Therapy Evaluation Patient Details Name: Brad Harrell MRN: [id number] DOB: 10/03/1946 Today's Date: 07/01/2014   History of Present Illness  This 68 y.o. male admitted with difficulty ambulating, impaired communication, Lt sided weakness.  In ED he was found to have BS 700+.   CT of head with no acute changes.  MRI pending.   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 Pt with decreased functional mobility secondary to deficits indicated below. Pt to benefit from skilled acute PT to address deficits and maximize functional mobility. Due to decr safety awareness and gt abnormalities pt is a fall risk. PTA pt was independent with ADLs and mobility. Will recommend CIR prior to returning home with family.   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 cues for hand placement and safety; pt impusive  Ambulation/Gait Ambulation/Gait assistance: Min assist;Mod assist Ambulation Distance (Feet): 30  Feet Assistive device: Rolling walker (2 wheeled) Gait Pattern/deviations: Ataxic;Step-through pattern;Decreased stride length;Shuffle;Trunk flexed Gait velocity: impulsively quick   General Gait Details: pt required max cues for safety; and mod (A) to perform directional turns and maintain balance; pt very distracted in hallway; max redirection  Stairs            Wheelchair Mobility    Modified Rankin (Stroke Patients Only) Modified Rankin (Stroke Patients Only) Pre-Morbid Rankin Score: Moderate disability Modified Rankin: Moderately severe disability     Balance Overall balance assessment: Needs assistance Sitting-balance support: Feet supported;No upper extremity supported Sitting balance-Leahy Scale: Good Sitting balance - Comments: sittig in recliner Postural control: Left lateral lean Standing balance support: During functional activity;Bilateral upper extremity supported Standing balance-Leahy Scale: Poor Standing balance comment: relies on RW; unsteady                             Pertinent Vitals/Pain Pain Assessment: No/denies pain    Home Living Family/patient expects to be discharged to:: Private residence Living Arrangements: Children Available Help at Discharge: Family;Available 24 hours/day Type of Home: Apartment Home Access: Level entry     Home Layout: One level Home Equipment: Shower seat Additional Comments: Pt lives with daughter who works during the day.  He reports his other daughter can assist during the day    Prior Function Level of Independence: Independent with assistive device(s)         Comments: pt initially stated he is only during the day but later in evaluation stated another daughter can come stay with  him      Hand Dominance   Dominant Hand: Right    Extremity/Trunk Assessment   Upper Extremity Assessment: Defer to OT evaluation       LUE Deficits / Details: Grossly 4/5;  Lt. UE with dysmetria.   Impaired finger to nose   Lower Extremity Assessment: LLE deficits/detail   LLE Deficits / Details: Lt hip 4-/5; quad 4/5; ankle 4/5; decr functional strength when ambulating   Cervical / Trunk Assessment: Normal  Communication   Communication: No difficulties  Cognition Arousal/Alertness: Awake/alert Behavior During Therapy: Impulsive Overall Cognitive Status: Impaired/Different from baseline Area of Impairment: Attention;Safety/judgement;Problem solving   Current Attention Level: Selective (very distracted in hallway ) Memory: Decreased short-term memory   Safety/Judgement: Decreased awareness of safety;Decreased awareness of deficits   Problem Solving: Slow processing;Decreased initiation;Requires verbal cues;Requires tactile cues General Comments: max redirection and cues for safety and attention when ambulating in hallway;    General Comments      Exercises        Assessment/Plan    PT Assessment Patient needs continued PT services  PT Diagnosis Difficulty walking;Abnormality of gait;Generalized weakness   PT Problem List Decreased strength;Decreased activity tolerance;Decreased balance;Decreased mobility;Decreased cognition;Decreased coordination;Decreased knowledge of use of DME;Decreased safety awareness  PT Treatment Interventions DME instruction;Gait training;Functional mobility training;Therapeutic activities;Therapeutic exercise;Balance training;Neuromuscular re-education;Patient/family education;Cognitive remediation   PT Goals (Current goals can be found in the Care Plan section) Acute Rehab PT Goals Patient Stated Goal: to go home with my daughter  PT Goal Formulation: With patient Time For Goal Achievement: 07/08/14 Potential to Achieve Goals: Good    Frequency Min 4X/week   Barriers to discharge Decreased caregiver support      Co-evaluation               End of Session Equipment Utilized During Treatment: Gait belt Activity Tolerance:  Patient tolerated treatment well Patient left: in chair;with call bell/phone within reach Nurse Communication: Mobility status;Precautions         Time: 9201-0071 PT Time Calculation (min): 18 min   Charges:   PT Evaluation $Initial PT Evaluation Tier I: 1 Procedure PT Treatments $Gait Training: 8-22 mins   PT G CodesGustavus Bryant, [place] 07/01/2014, 3:36 PM

## 2014-07-01 NOTE — Progress Notes (Signed)
[facility]  Brad Harrell BTD:[id number] DOB: February 02, 1946 DOA: 06/30/2014 PCP: [name], MD  Brief narrative: 68 year old male patient with history of prior CVA, hypertension, hypothyroidism, recent treatment for liver abscess June of this year. He was brought to the emergency department due to complaints of left-sided weakness and fatigue. According to the family he had been tired and different since Sunday prior to presentation. Not been eating or drinking properly. He complained to his family of left-sided weakness.  Upon presentation to the emergency department the patient's blood sugar was noted to be 700. The patient does not carry formal diagnosis of diabetes. Patient's urinalysis was abnormal and concerning for possible urinary tract infection. CT of the head showed no acute changes. 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  Since admission the patient's anion gap has closed and he has been transitioned to subcutaneous Lantus insulin. His hemoglobin A1c has returned back 11.6 and therefore will likely need to discharge on insulin. Hemoglobin A1c in 2012 was 7.1. On exam strength appears to be symmetrical but patient continues to complain of left arm weakness. 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 Occupational therapy has examined the patient and recommend CIR evaluation for possible admission.  HPI/Subjective: Patient alert and complaining of generalized weakness though he feels it is somewhat worse in the left arm. Denies chest pain or shortness of breath.  Assessment/Plan:    DKA/Diabetes mellitus, new onset -off insulin gtt and now on nocturnal Lantus with sliding scale insulin-CBGs not well controlled but since insulin nave we'll watch closely before  either increasing Lantus or sliding scale-hemoglobin A1c 11.6    Gram-negative rod urinary tract infection/H/O urinary retention -Urine culture pending-continue empiric antibiotics-patient did have leukocytosis but this could also be explained by DKA     Left arm weakness/History of CVA  -CT head negative-an MRI is pending-if MRI positive for acute stroke we'll need to consult neurology-PT OT following and recommendation is for CIR so consultation placed-weakness symptoms could be just related to DKA and deconditioned-continue aspirin and Plavix-exam not impressive for acute CVA    Thrombocytopenia -Unclear etiology but given concerns for UTI could be related to gram-negative infection-was also on Plavix prior to admission but platelet baseline has been normal-check blood cultures    H/O cocaine abuse -Urine drug screen negative at presentation    Hypertension -Blood pressure remains soft-continue carvedilol and Cardura cautiously    History of cardiomyopathy -Echocardiogram this admission with diffuse hypokinesis with an EF of 30-35% as well as grade 1 diastolic dysfunction-this is unchanged from previous echo in 2010-appears compensated without evidence of systolic heart failure-once DKA resolved with decrease IV fluids    History of Liver abscess -Post percutaneous drainage in interventional radiology (in June) as well as long-term antibiotics completed in July    Hypothyroidism -Continue Synthroid    Anemia, iron deficiency -Baseline hemoglobin around 9-was quite hemoconcentrated at presentation with a hemoglobin of 14.9 likely due to dehydration-hemoglobin is drifted down to 12 with hydration  DVT prophylaxis: Lovenox Code Status: Full Family Communication: No family at bedside Disposition Plan/Expected LOS: Transfer to telemetry  Consultants: CIR  Procedures: 2-D echocardiogram with stable findings of diffuse hypokinesis, systolic dysfunction with EF 30-35%, grade 1  diastolic dysfunction  Cultures: 9/1 urine culture >100,000 colonies gram-negative  rod 9/2 blood cultures x2 pending  Antibiotics: Rocephin 9/1 >>>  Objective: Blood pressure 99/65, pulse 94, temperature 97.9 F (36.6 C), temperature source Oral, resp. rate 17, weight 192 lb 14.4 oz (87.5 kg), SpO2 94.00%.  Intake/Output Summary (Last 24 hours) at 07/01/14 1318 Last data filed at 07/01/14 1300  Gross per 24 hour  Intake    885 ml  Output    525 ml  Net    360 ml    Exam: Gen: No acute respiratory distress Neuro: Alert, moves all extremities x4 and strength appears equal and symmetrical in upper and lower extremities, face is symmetrical, tongue protrusion is midline Chest: Clear to auscultation bilaterally without wheezes, rhonchi or crackles, room air Cardiac: Regular rate and rhythm, S1-S2, no rubs murmurs or gallops, no peripheral edema, no JVD Abdomen: Soft nontender nondistended without obvious hepatosplenomegaly, no ascites Extremities: Symmetrical in appearance without cyanosis, clubbing or effusion  Scheduled Meds:  Scheduled Meds: . antiseptic oral rinse  7 mL Mouth Rinse BID  . aspirin EC  81 mg Oral Daily  . carvedilol  6.25 mg Oral BID WC  . cefTRIAXone (ROCEPHIN)  IV  1 g Intravenous Q24H  . clopidogrel  75 mg Oral Daily  . doxazosin  8 mg Oral QHS  . enoxaparin (LOVENOX) injection  40 mg Subcutaneous Q24H  . insulin aspart  0-5 Units Subcutaneous QHS  . insulin aspart  0-9 Units Subcutaneous TID WC  . insulin glargine  10 Units Subcutaneous QHS  . levothyroxine  100 mcg Oral QAC breakfast  . simvastatin  40 mg Oral QPM   Data Reviewed: Basic Metabolic Panel:  Recent Labs Lab 06/30/14 1220 06/30/14 1647 06/30/14 2105 07/01/14 0010 07/01/14 0411  NA 135*  --  142 146 145  K 6.6*  --  3.8 4.3 4.0  CL 98  --  108 112 111  CO2 11*  --  19 20 21   GLUCOSE 555*  --  204* 106* 113*  BUN 17  --  15 15 15   CREATININE 1.29 1.16 1.03 0.99 1.01  CALCIUM  9.0  --  8.9 8.9 9.0   Liver Function Tests:  Recent Labs Lab 06/30/14 1220  AST 12  ALT 10  ALKPHOS 71  BILITOT 1.6*  PROT 8.0  ALBUMIN 3.2*   CBC:  Recent Labs Lab 06/30/14 1044 06/30/14 1647 07/01/14 0130  WBC 16.2* 14.5* 12.5*  HGB 14.9 12.3* 12.1*  HCT 44.1 36.9* 35.8*  MCV 87.3 87.2 85.0  PLT PLATELET CLUMPS NOTED ON SMEAR, COUNT APPEARS ADEQUATE 141* 147*   Cardiac Enzymes:  Recent Labs Lab 06/30/14 1220  TROPONINI <0.30   CBG:  Recent Labs Lab 07/01/14 0141 07/01/14 0236 07/01/14 0332 07/01/14 0741 07/01/14 1136  GLUCAP 159* 161* 132* 202* 278*    Recent Results (from the past 240 hour(s))  URINE CULTURE     Status: None   Collection Time    06/30/14 12:13 PM      Result Value Ref Range Status   Specimen Description URINE, RANDOM   Final   Special Requests NONE   Final   Culture  Setup Time     Final   Value: 06/30/2014 17:38     Performed at [facility]     Final   Value: >=100,000 COLONIES/ML     Performed at [facility]   Culture     Final   Value: [place]     Performed  at [facility]   Report Status PENDING   Incomplete  MRSA PCR SCREENING     Status: None   Collection Time    06/30/14  3:49 PM      Result Value Ref Range Status   MRSA by PCR NEGATIVE  NEGATIVE Final   Comment:            The GeneXpert MRSA Assay (FDA     approved for NASAL specimens     only), is one component of a     comprehensive MRSA colonization     surveillance program. It is not     intended to diagnose MRSA     infection nor to guide or     monitor treatment for     MRSA infections.     Studies:  Recent x-ray studies have been reviewed in detail by the Attending Physician  Time spent :  [place], ANP [facility] Office  [phone] Pager [phone]   **If unable to reach the above provider after paging please contact the [facility] @  [phone]  On-Call/Text Page:      [email or web address]      password [email or web address]  If 7PM-7AM, please contact night-coverage [email or web address] Password [email or web address] 07/01/2014, 1:18 PM   LOS: 1 day   I have personally examined this patient and reviewed the entire database. I have reviewed the above note, made any necessary editorial changes, and agree with its content.  Cherene Altes, MD [facility]

## 2014-07-01 NOTE — Progress Notes (Signed)
Inpatient Diabetes Program Recommendations  AACE/ADA: New Consensus Statement on Inpatient Glycemic Control (2013)  Target Ranges:  Prepandial:   less than 140 mg/dL      Peak postprandial:   less than 180 mg/dL (1-2 hours)      Critically ill patients:  140 - 180 mg/dL   Reason for Assessment:  Referral received.  Note that patient admitted with hyperglycemia.  No previous history of diabetes noted, however A1C in 2012 was 7.1%.  Patient has transitioned off the insulin drip over night.    Current orders for Inpatient glycemic control:  Lantus 10 units daily, Novolog sensitive tid with meals and HS  Will follow.  Patient will likely need insulin at discharge.  Will discuss with patient. Continue to titrate insulin based on CBG's.    Thanks, Adah Perl, RN, BC-ADM Inpatient Diabetes Coordinator Pager [phone]

## 2014-07-01 NOTE — Evaluation (Signed)
Clinical/Bedside Swallow Evaluation Patient Details  Name: Brad Harrell MRN: [id number] Date of Birth: 1946-02-24  Today's Date: 07/01/2014 Time: 1009-1021 SLP Time Calculation (min): 12 min  Past Medical History:  Past Medical History  Diagnosis Date  . Hypertension   . Urinary retention   . Hyperlipidemia   . Cardiomyopathy   . H/O cocaine abuse   . Hypothyroidism   . Stroke     "weaker on my left side since" (03/04/2013)  . Liver abscess     measuring 7 x 9 cm/notes 03/04/2013  . Anemia     Iron Defficiency  . Borderline diabetes    Past Surgical History:  Past Surgical History  Procedure Laterality Date  . Appendectomy    . Inguinal hernia repair Right   . Tibia fracture surgery Left   . Colonoscopy N/A 03/11/2013    Procedure: COLONOSCOPY;  Surgeon: Beryle Beams, MD;  Location: [facility] ENDOSCOPY;  Service: Endoscopy;  Laterality: N/A;  . Esophagogastroduodenoscopy N/A 03/11/2013    Procedure: ESOPHAGOGASTRODUODENOSCOPY (EGD);  Surgeon: Beryle Beams, MD;  Location[facility] ENDOSCOPY;  Service: Endoscopy;  Laterality: N/A;  . Givens capsule study N/A 03/12/2013    Procedure: GIVENS CAPSULE STUDY;  Surgeon: Beryle Beams, MD;  Location: [place];  Service: Endoscopy;  Laterality: N/A;   HPI:  Brad Harrell is a 68 y.o. male with a history of CVA, hypertension, and hypothyroidism who presented to the emergency department with left-sided weakness and fatigue. Pt was found to be in diabetic ketoacidosis and work-up is underway for possible stroke.   Assessment / Plan / Recommendation Clinical Impression  Pt's oropharyngeal swallow is functional with no overt signs of aspiration noted with regular solids, thin liquids, and mixed consistency boluses. Pt has mildly impaired mastication with oral residuals which SLP suspects is baseline due to condition of dentition. Pt manages this well by utilizing lingual sweeps and liquid washes with Mod I. Of note, patient has a molar on the  top right side that appears to be hanging down from his gums. Pt reports that it has been like this "for years," and it did not appear to move when lingual pressure was applied; RN notified. Recommend to continue with regular textures and thin liquids without further SLP f/u for swallowing.     Aspiration Risk  Mild    Diet Recommendation Regular;Thin liquid   Liquid Administration via: Cup;Straw Medication Administration: Whole meds with liquid Supervision: Patient able to self feed Compensations: Slow rate;Small sips/bites Postural Changes and/or Swallow Maneuvers: Seated upright 90 degrees    Other  Recommendations Oral Care Recommendations: Oral care BID   Follow Up Recommendations  None    Frequency and Duration        Pertinent Vitals/Pain n/a    SLP Swallow Goals     Swallow Study Prior Functional Status       General Date of Onset: 06/30/14 HPI: Brad Harrell is a 68 y.o. male with a history of CVA, hypertension, and hypothyroidism who presented to the emergency department with left-sided weakness and fatigue. Pt was found to be in diabetic ketoacidosis and work-up is underway for possible stroke. Type of Study: Bedside swallow evaluation Previous Swallow Assessment: none in chart Diet Prior to this Study: Regular;Thin liquids Temperature Spikes Noted: No Respiratory Status: Room air History of Recent Intubation: No Behavior/Cognition: Alert;Cooperative;Pleasant mood Oral Cavity - Dentition: Poor condition;Missing dentition (one tooth hanging from gums, doesn't seem loose-RN notified) Self-Feeding Abilities: Able to feed self Patient  Positioning: Upright in bed Baseline Vocal Quality: Clear    Oral/Motor/Sensory Function     Ice Chips Ice chips: Not tested   Thin Liquid Thin Liquid: Within functional limits Presentation: Self Fed;Straw    Nectar Thick Nectar Thick Liquid: Not tested   Honey Thick Honey Thick Liquid: Not tested   Puree Puree: Not tested    Solid   GO    Solid: Impaired Presentation: Self Fed Oral Phase Impairments: Impaired mastication Oral Phase Functional Implications: Oral residue Other Comments: impaired mastication and oral residuals likely baseline due to condition of dentition, as no weakness was functionally noted; pt manages the above with Mod I        Germain Osgood, M.A. CCC-SLP [phone]  Germain Osgood 07/01/2014,10:34 AM

## 2014-07-02 DIAGNOSIS — I428 Other cardiomyopathies: Secondary | ICD-10-CM

## 2014-07-02 DIAGNOSIS — R5381 Other malaise: Secondary | ICD-10-CM

## 2014-07-02 DIAGNOSIS — N390 Urinary tract infection, site not specified: Secondary | ICD-10-CM

## 2014-07-02 LAB — CBC
HCT: 31.9 % — ABNORMAL LOW (ref 39.0–52.0)
HEMOGLOBIN: 10.5 g/dL — AB (ref 13.0–17.0)
MCH: 28.2 pg (ref 26.0–34.0)
MCHC: 32.9 g/dL (ref 30.0–36.0)
MCV: 85.8 fL (ref 78.0–100.0)
PLATELETS: 133 10*3/uL — AB (ref 150–400)
RBC: 3.72 MIL/uL — ABNORMAL LOW (ref 4.22–5.81)
RDW: 12.8 % (ref 11.5–15.5)
WBC: 6.6 10*3/uL (ref 4.0–10.5)

## 2014-07-02 LAB — URINE CULTURE: Colony Count: >=100000

## 2014-07-02 LAB — GLUCOSE, CAPILLARY
GLUCOSE-CAPILLARY: 166 mg/dL — AB (ref 70–99)
GLUCOSE-CAPILLARY: 162 mg/dL — AB (ref 70–99)
Glucose-Capillary: 166 mg/dL — ABNORMAL HIGH (ref 70–99)
Glucose-Capillary: 224 mg/dL — ABNORMAL HIGH (ref 70–99)

## 2014-07-02 LAB — BASIC METABOLIC PANEL
Anion gap: 13 (ref 5–15)
BUN: 13 mg/dL (ref 6–23)
CALCIUM: 8.2 mg/dL — AB (ref 8.4–10.5)
CO2: 21 mEq/L (ref 19–32)
Chloride: 107 mEq/L (ref 96–112)
Creatinine, Ser: 0.89 mg/dL (ref 0.50–1.35)
GFR, EST NON AFRICAN AMERICAN: 85 mL/min — AB (ref >90)
GLUCOSE: 186 mg/dL — AB (ref 70–99)
POTASSIUM: 3.6 meq/L — AB (ref 3.7–5.3)
SODIUM: 141 meq/L (ref 137–147)

## 2014-07-02 MED ORDER — BISACODYL 10 MG RE SUPP: 10.0000 mg | Freq: Every day | RECTAL | Status: DC | PRN

## 2014-07-02 MED ORDER — SENNA 8.6 MG PO TABS
1.0000 | ORAL_TABLET | Freq: Every day | ORAL | Status: DC
  Administered 2014-07-02 – 2014-07-03 (×2): 8.6 mg via ORAL
  Filled 2014-07-02 (×2): qty 1

## 2014-07-02 MED ORDER — POLYETHYLENE GLYCOL 3350 17 G PO PACK
17.0000 g | PACK | Freq: Two times a day (BID) | ORAL | Status: DC
  Administered 2014-07-02 – 2014-07-03 (×2): 17 g via ORAL
  Filled 2014-07-02 (×3): qty 1

## 2014-07-02 MED ORDER — GLUCERNA SHAKE PO LIQD: 237.0000 mL | Freq: Two times a day (BID) | ORAL | Status: DC | PRN

## 2014-07-02 MED ORDER — POTASSIUM CHLORIDE CRYS ER 20 MEQ PO TBCR
40.0000 meq | EXTENDED_RELEASE_TABLET | Freq: Once | ORAL | Status: AC
  Administered 2014-07-02: 40 meq via ORAL
  Filled 2014-07-02: qty 2

## 2014-07-02 NOTE — Progress Notes (Signed)
Rehab admissions - Evaluated for possible admission.  Please see rehab consult recommending [place] follow up.  Patient is near his baseline.  No need for acute inpatient rehab admission.  Call me for questions.  #[phone]

## 2014-07-02 NOTE — Progress Notes (Signed)
  RD consulted for nutrition education regarding diabetes.   Lab Results  Component Value Date   HGBA1C 11.4* 06/30/2014    RD provided "Carbohydrate Counting for People with Diabetes" handout from the Academy of [employer] and Dietetics. Discussed different food groups and their effects on blood sugar, emphasizing carbohydrate-containing foods. Provided list of carbohydrates and recommended serving sizes of common foods. Pt needed repetition of information provided.   Discussed importance of controlled and consistent carbohydrate intake throughout the day. Provided examples of ways to balance meals/snacks and encouraged intake of high-fiber, whole grain complex carbohydrates. Encouraged pt to eat protein and vegetables with each meal. Provide sample menus.  Teach back method used. Pt seems to have difficulty comprehending information provided but, he was able to state at end of education that he needs to stop drinking soda and cut back on his intake of cookies/cakes.  He reports that his daughter cooks for him and feeds him. Encouraged pt to share handout with his daughter and have her call RD with any questions.  Expect fair compliance.  Body mass index is 33.58 kg/(m^2). Pt meets criteria for Obesity based on current BMI.  Current diet order is Carb Modified, patient is consuming approximately 25-45% of meals at this time. Pt reports having a poor appetite since admission. RD to order Glucerna Shakes PRN. Labs and medications reviewed. No further nutrition interventions warranted at this time. RD contact information provided. If additional nutrition issues arise, please re-consult RD.  Pryor Ochoa RD, LDN Inpatient Clinical Dietitian Pager: [phone] After Hours Pager: [phone]

## 2014-07-02 NOTE — Progress Notes (Signed)
Progress Note  Brad Harrell VVO:[id number] DOB: April 10, 1946 DOA: 06/30/2014 PCP: [name], MD  Brief narrative: 68 year old male patient with history of prior CVA, hypertension, hypothyroidism, recent treatment for liver abscess June 2014.  He was brought to the emergency department due to complaints of left-sided weakness and fatigue. According to the family he had been tired and different since Sunday prior to presentation. Not been eating or drinking properly. He complained to his family of left-sided weakness.  Upon presentation to the emergency department the patient's blood sugar was noted to be 700. The patient does not carry formal diagnosis of diabetes. Patient's urinalysis was abnormal and concerning for possible urinary tract infection. CT of the head showed no acute changes. 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  Since admission the patient's anion gap has closed and he has been transitioned to subcutaneous Lantus insulin. His hemoglobin A1c has returned back 11.6 and therefore will likely need to discharge on insulin. Hemoglobin A1c in 2012 was 7.1. On exam strength appears to be symmetrical but patient continues to complain of left arm weakness. 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 Occupational therapy has examined the patient and recommend CIR evaluation for possible admission.  HPI/Subjective: Feeling well, No BM since Sunday.  Weakness left arm improved.   Assessment/Plan:    DKA/Diabetes mellitus, new onset -off insulin gtt and now on nocturnal Lantus with sliding scale insulin --hemoglobin A1c 11.6 -Continue with lantus 16 units and SSI.  -He or family member will need to be educated  for administration of lantus.     Gram-negative rod urinary tract infection/H/O urinary  retention -Urine culture (9-01)gram negative rods.  -continue with Ceftriaxone day 2. -Blood culture: pending.     Left arm weakness/History of CVA  -CT head negative-an MRI is negative for acute stroke.  -PT OT following and recommendation is for CIR so consultation placed -weakness symptoms could be just related to DKA and deconditioned.  -continue aspirin and Plavix-exam not impressive for acute CVA -Doppler: Left 1-39% ICA stenosis. Vertebral artery flow is antegrade.     Thrombocytopenia -Unclear etiology but given concerns for UTI could be related to gram-negative infection -stable.     H/O cocaine abuse -Urine drug screen negative at presentation    Hypertension -Blood pressure remains soft-continue carvedilol and Cardura cautiously    History of cardiomyopathy -Echocardiogram this admission with diffuse hypokinesis with an EF of 30-35% as well as grade 1 diastolic dysfunction-this is unchanged from previous echo in 2010-appears compensated without evidence of systolic heart failure     History of Liver abscess -Post percutaneous drainage in interventional radiology (in June 2014) as well as long-term antibiotics completed in July    Hypothyroidism -Continue Synthroid    Anemia, iron deficiency -Baseline hemoglobin around 9-was quite hemoconcentrated at presentation with a hemoglobin of 14.9 likely due to dehydration-hemoglobin is drifted down to 12 with hydration  DVT prophylaxis: Lovenox Code Status: Full Family Communication: No family at bedside Disposition Plan/Expected LOS: Waiting CIR evaluation. Follow Blood culture, urine culture.   Consultants: CIR  Procedures: 2-D echocardiogram with stable findings of diffuse hypokinesis, systolic dysfunction with EF 26-94%, grade 1 diastolic dysfunction  Cultures: 9/1 urine culture >100,000 colonies gram-negative rod 9/2 blood cultures x2 pending  Antibiotics: Rocephin 9/1 >>>  Objective: Blood pressure  131/67,  pulse 87, temperature 98.5 F (36.9 C), temperature source Oral, resp. rate 18, weight 91.536 kg (201 lb 12.8 oz), SpO2 98.00%.  Intake/Output Summary (Last 24 hours) at 07/02/14 0819 Last data filed at 07/02/14 0222  Gross per 24 hour  Intake    735 ml  Output    150 ml  Net    585 ml    Exam: Gen: No acute respiratory distress Neuro: Alert, moves all extremities x4 and strength appears equal and symmetrical in upper and lower extremities.  Chest: Clear to auscultation bilaterally without wheezes, rhonchi or crackles, room air Cardiac: Regular rate and rhythm, S1-S2, no rubs murmurs or gallops, no peripheral edema, no JVD Abdomen: Soft nontender, distended.  Extremities: Symmetrical in appearance without cyanosis, clubbing or effusion  Scheduled Meds:  Scheduled Meds: . antiseptic oral rinse  7 mL Mouth Rinse BID  . aspirin EC  81 mg Oral Daily  . carvedilol  6.25 mg Oral BID WC  . cefTRIAXone (ROCEPHIN)  IV  1 g Intravenous Q24H  . clopidogrel  75 mg Oral Daily  . doxazosin  8 mg Oral QHS  . enoxaparin (LOVENOX) injection  40 mg Subcutaneous Q24H  . insulin aspart  0-5 Units Subcutaneous QHS  . insulin aspart  0-9 Units Subcutaneous TID WC  . insulin aspart  4 Units Subcutaneous TID WC  . insulin glargine  16 Units Subcutaneous QHS  . insulin starter kit- syringes  1 kit Other Once  . levothyroxine  100 mcg Oral QAC breakfast  . living well with diabetes book   Does not apply Once  . simvastatin  40 mg Oral QPM   Data Reviewed: Basic Metabolic Panel:  Recent Labs Lab 06/30/14 1220 06/30/14 1647 06/30/14 2105 07/01/14 0010 07/01/14 0411 07/02/14 0620  NA 135*  --  142 146 145 141  K 6.6*  --  3.8 4.3 4.0 3.6*  CL 98  --  108 112 111 107  CO2 11*  --  _0 GLUCOSE 555*  --  204* 106* 113* 186*  BUN 17  --  _1 CREATININE 1.29 1.16 1.03 0.99 1.01 0.89  CALCIUM 9.0  --  8.9 8.9 9.0 8.2*   Liver Function Tests:  Recent Labs Lab  06/30/14 1220  AST 12  ALT 10  ALKPHOS 71  BILITOT 1.6*  PROT 8.0  ALBUMIN 3.2*   CBC:  Recent Labs Lab 06/30/14 1044 06/30/14 1647 07/01/14 0130 07/02/14 0620  WBC 16.2* 14.5* 12.5* 6.6  HGB 14.9 12.3* 12.1* 10.5*  HCT 44.1 36.9* 35.8* 31.9*  MCV 87.3 87.2 85.0 85.8  PLT PLATELET CLUMPS NOTED ON SMEAR, COUNT APPEARS ADEQUATE 141* 147* 133*   Cardiac Enzymes:  Recent Labs Lab 06/30/14 1220  TROPONINI <0.30   CBG:  Recent Labs Lab 07/01/14 0741 07/01/14 1136 07/01/14 1735 07/01/14 2121 07/02/14 0656  GLUCAP 202* 278* 224* 260* 166*    Recent Results (from the past 240 hour(s))  URINE CULTURE     Status: None   Collection Time    06/30/14 12:13 PM      Result Value Ref Range Status   Specimen Description URINE, RANDOM   Final   Special Requests NONE   Final   Culture  Setup Time     Final   Value: 06/30/2014 17:38     Performed at [facility]     Final   Value: >=100,000 COLONIES/ML  Performed at [facility]     Final   Value: [place]     Performed at [facility]   Report Status PENDING   Incomplete  MRSA PCR SCREENING     Status: None   Collection Time    06/30/14  3:49 PM      Result Value Ref Range Status   MRSA by PCR NEGATIVE  NEGATIVE Final   Comment:            The GeneXpert MRSA Assay (FDA     approved for NASAL specimens     only), is one component of a     comprehensive MRSA colonization     surveillance program. It is not     intended to diagnose MRSA     infection nor to guide or     monitor treatment for     MRSA infections.  URINE CULTURE     Status: None   Collection Time    06/30/14  9:00 PM      Result Value Ref Range Status   Specimen Description URINE, CLEAN CATCH   Final   Special Requests NONE   Final   Culture  Setup Time     Final   Value: 06/30/2014 22:06     Performed at [facility] Count     Final   Value: NO GROWTH      Performed at [facility]   Culture     Final   Value: NO GROWTH     Performed at [facility]   Report Status 07/01/2014 FINAL   Final     Studies:  Recent x-ray studies have been reviewed in detail by the Attending Physician  Time spent : 41 mins   Niel Hummer, MD.  [phone]  **If unable to reach the above provider after paging please contact the Flow Manager @ [phone]  On-Call/Text Page:      [email or web address]      password [email or web address]  If 7PM-7AM, please contact night-coverage [email or web address] Password [email or web address] 07/02/2014, 8:19 AM   LOS: 2 days

## 2014-07-02 NOTE — Progress Notes (Signed)
Physical Therapy Treatment Patient Details Name: Brad Harrell MRN: [id number] DOB: 04-29-1946 Today's Date: 07/02/2014    History of Present Illness  68 y.o. male admitted with difficulty ambulating, impaired communication, Lt sided weakness. In ED he was found to have BS 700+. CT of head with no acute changes. MRI (-) No acute intracranial abnormality. Remote lacunar infarcts of the basal ganglia and coronal radiata bilaterally.  Remote retinal detachment of the right globe.  Pt diagnosed with DKA; sepsis secondary to DKA and UTI; hyperkalemia; HTN; PMH includes: urinary retention; cardiomyopathy; h/o cocaine abuse; CVA with Lt. sided weakness 03/04/13; liver abcess; anemia     PT Comments     pt able to increase mobility today but continues to require mod (A) for safety. Pt highly distractible and is a high fall risk. Pt is unsafe to D/C home unless family commits to physical (A) 24/7.  Pt admits today that he is more "off balance" than PTA.  Cont to recommend CIR for post acute rehab.   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 per pt Restrictions Weight Bearing Restrictions: No    Mobility  Bed Mobility Overal bed mobility: Needs Assistance Bed Mobility: Supine to Sit     Supine to sit: Supervision;HOB elevated     General bed mobility comments: bed mobility very effortful for pt; pt relying heavily on handrails   Transfers Overall transfer level: Needs assistance Equipment used: Rolling walker (2 wheeled) Transfers: Sit to/from Stand Sit to Stand: Min assist         General transfer comment: pt attempting to stand multiple times without RW in position; cues for safety throughout and (A) to maintain balance   Ambulation/Gait Ambulation/Gait assistance: Mod  assist Ambulation Distance (Feet): 100 Feet Assistive device: Rolling walker (2 wheeled) Gait Pattern/deviations: Decreased dorsiflexion - left;Decreased stride length;Decreased step length - left;Staggering left;Wide base of support;Trunk flexed;Ataxic Gait velocity: impulsively quick   General Gait Details: cues for safety throughout; pt very distracted in hallway and is a high fall risk; mod (A) with support through gt belt to maintain balance; pt with multiple LOB during ambulation and seemed unaware; decr safety awarenss or knowledge of deficits    Stairs            Wheelchair Mobility    Modified Rankin (Stroke Patients Only) Modified Rankin (Stroke Patients Only) Pre-Morbid Rankin Score: Moderate disability Modified Rankin: Moderately severe disability     Balance Overall balance assessment: Needs assistance;History of Falls Sitting-balance support: Feet supported;No upper extremity supported Sitting balance-Leahy Scale: Good     Standing balance support: During functional activity;Bilateral upper extremity supported Standing balance-Leahy Scale: Poor Standing balance comment: pt relies on RW: very unsteady when attempting ADLs or balance activities in standing                     Cognition Arousal/Alertness: Awake/alert Behavior During Therapy: Impulsive Overall Cognitive Status: Impaired/Different from baseline Area of Impairment: Attention;Safety/judgement;Problem solving   Current Attention Level: Selective Memory: Decreased short-term memory   Safety/Judgement: Decreased awareness of safety;Decreased awareness of deficits   Problem Solving: Slow processing;Decreased initiation;Requires verbal cues;Requires tactile cues General Comments: no family present to determine baseline; pt very distracted in hallway; believes he can ambulate safely independently but requiring incr (A)  Exercises      General Comments        Pertinent Vitals/Pain  Pain Assessment: No/denies pain    Home Living                      Prior Function            PT Goals (current goals can now be found in the care plan section) Acute Rehab PT Goals Patient Stated Goal: to go home today PT Goal Formulation: With patient Time For Goal Achievement: 07/08/14 Potential to Achieve Goals: Good Progress towards PT goals: Progressing toward goals    Frequency  Min 4X/week    PT Plan Current plan remains appropriate    Co-evaluation             End of Session Equipment Utilized During Treatment: Gait belt Activity Tolerance: Patient limited by fatigue Patient left: in bed;with call bell/phone within reach;with bed alarm set     Time: 1020-1044 PT Time Calculation (min): 24 min  Charges:  [employer] Training: 23-37 mins                    G CodesGustavus Harrell, [place]  [phone] 07/02/2014, 10:58 AM

## 2014-07-02 NOTE — Consult Note (Signed)
Physical Medicine and Rehabilitation Consult Reason for Consult: DKA/diabetes mellitus Referring Physician: [facility]   HPI: Brad Harrell is a 68 y.o. right-handed male with history of hypertension, hypothyroidism, CVA with left hemiparesthesias and borderline diabetes mellitus on no current diabetic agents. Well-known rehabilitation services for admission in May of 2014 for debilitation from liver abscess superimposed on her chronic left hemiparesthesias from CVA. Patient independent prior to admission with assistive device living with daughter who works during the day. Presented 06/30/2014 with decreased mobility and impaired communication. Blood sugar upon admission to the emergency department of 700. Cranial CT scan with no acute changes. Noted diffuse cerebral atrophy consistent with chronic ischemia. MRI of the brain with no acute intracranial abnormalities, remote lacunar infarct of the basal ganglia and coronal radiata bilaterally. MRA of the head moderate distal small vessel disease. Echocardiogram with ejection fraction of 48% grade 1 diastolic dysfunction. Carotid Dopplers with no ICA stenosis. Urine culture no growth. Suspect DKA crisis. Patient placed on insulin drip for elevated blood sugar. Hemoglobin A1c 11.6 upon admission was noted prior A1c 05/01/2011 of 7.1 . mental status mobility continues to improve. Physical and occupational therapy evaluation completed 07/01/2014 with recommendations of physical medicine rehabilitation consult.   Review of Systems  Constitutional:       Left-sided weakness  Eyes:       Blindness right eye  Gastrointestinal: Positive for constipation. Negative for vomiting.  Genitourinary:       Urinary retention  Musculoskeletal: Positive for myalgias.  All other systems reviewed and are negative.  Past Medical History  Diagnosis Date  . Hypertension   . Urinary retention   . Hyperlipidemia   . Cardiomyopathy   . H/O cocaine abuse   .  Hypothyroidism   . Stroke     "weaker on my left side since" (03/04/2013)  . Liver abscess     measuring 7 x 9 cm/notes 03/04/2013  . Anemia     Iron Defficiency  . Borderline diabetes    Past Surgical History  Procedure Laterality Date  . Appendectomy    . Inguinal hernia repair Right   . Tibia fracture surgery Left   . Colonoscopy N/A 03/11/2013    Procedure: COLONOSCOPY;  Surgeon: Beryle Beams, MD;  Location: [facility] ENDOSCOPY;  Service: Endoscopy;  Laterality: N/A;  . Esophagogastroduodenoscopy N/A 03/11/2013    Procedure: ESOPHAGOGASTRODUODENOSCOPY (EGD);  Surgeon: Beryle Beams, MD;  Location: [facility] ENDOSCOPY;  Service: Endoscopy;  Laterality: N/A;  . Givens capsule study N/A 03/12/2013    Procedure: GIVENS CAPSULE STUDY;  Surgeon: Beryle Beams, MD;  Location: [place];  Service: Endoscopy;  Laterality: N/A;   History reviewed. No pertinent family history. Social History:  reports that he has quit smoking. His smoking use included Cigarettes. He has a 51 pack-year smoking history. He has never used smokeless tobacco. He reports that he uses illicit drugs (Cocaine). He reports that he does not drink alcohol. Allergies: No Known Allergies Medications Prior to Admission  Medication Sig Dispense Refill  . aspirin EC 81 MG tablet Take 81 mg by mouth daily.      . carvedilol (COREG) 3.125 MG tablet Take 2 tablets (6.25 mg total) by mouth 2 (two) times daily with a meal.  120 tablet  1  . clopidogrel (PLAVIX) 75 MG tablet Take 75 mg by mouth daily.      Marland Kitchen doxazosin (CARDURA) 4 MG tablet Take 8 mg by mouth at bedtime.       Marland Kitchen  levothyroxine (SYNTHROID, LEVOTHROID) 100 MCG tablet Take 100 mcg by mouth daily.      . simvastatin (ZOCOR) 40 MG tablet Take 40 mg by mouth every evening.        Home: Home Living Family/patient expects to be discharged to:: Private residence Living Arrangements: Children Available Help at Discharge: Family;Available 24 hours/day Type of Home: Apartment Home  Access: Level entry Home Layout: One level Home Equipment: Shower seat Additional Comments: Pt lives with daughter who works during the day.  He reports his other daughter can assist during the day  Functional History: Prior Function Level of Independence: Independent with assistive device(s) Comments: pt initially stated he is only during the day but later in evaluation stated another daughter can come stay with him  Functional Status:  Mobility: Bed Mobility Overal bed mobility: Needs Assistance Bed Mobility: Supine to Sit Supine to sit: Supervision;HOB elevated General bed mobility comments: supervision for safety and cues for lines and lead; pt impulsive and attempting to exit bed when tangled in IV and leads Transfers Overall transfer level: Needs assistance Equipment used: Rolling walker (2 wheeled) Transfers: Sit to/from Stand Sit to Stand: Min assist Stand pivot transfers: Min assist General transfer comment: heavy lean to Lt. cues for hand placement and safety; pt impusive Ambulation/Gait Ambulation/Gait assistance: Min assist;Mod assist Ambulation Distance (Feet): 30 Feet Assistive device: Rolling walker (2 wheeled) Gait Pattern/deviations: Ataxic;Step-through pattern;Decreased stride length;Shuffle;Trunk flexed Gait velocity: impulsively quick General Gait Details: pt required max cues for safety; and mod (A) to perform directional turns and maintain balance; pt very distracted in hallway; max redirection    ADL: ADL Overall ADL's : Needs assistance/impaired Eating/Feeding: Set up;Sitting Grooming: Wash/dry face;Wash/dry hands;Oral care;Brushing hair;Set up;Sitting Upper Body Bathing: Set up;Supervision/ safety;Sitting Lower Body Bathing: Moderate assistance;Sit to/from stand Lower Body Bathing Details (indicate cue type and reason): due to impaired balance Upper Body Dressing : Minimal assistance;Sitting Lower Body Dressing: Moderate assistance;Sit to/from  stand Lower Body Dressing Details (indicate cue type and reason): Pt able to don/doff socks, with increased time and effort, but requires assist for balance and to pull pants over hips  Toilet Transfer: Moderate assistance;Ambulation;Comfort height toilet Toileting- Clothing Manipulation and Hygiene: Moderate assistance;Sit to/from stand Functional mobility during ADLs: Moderate assistance;Rolling walker General ADL Comments: Pt appears ataxic.  He loses balance to Lt. when up requiring assistance.    Cognition: Cognition Overall Cognitive Status: Impaired/Different from baseline Orientation Level: Oriented to person;Oriented to place;Oriented to time Cognition Arousal/Alertness: Awake/alert Behavior During Therapy: Impulsive Overall Cognitive Status: Impaired/Different from baseline Area of Impairment: Attention;Safety/judgement;Problem solving Current Attention Level: Selective (very distracted in hallway ) Memory: Decreased short-term memory Safety/Judgement: Decreased awareness of safety;Decreased awareness of deficits Problem Solving: Slow processing;Decreased initiation;Requires verbal cues;Requires tactile cues General Comments: max redirection and cues for safety and attention when ambulating in hallway;  Blood pressure 129/68, pulse 80, temperature 98.9 F (37.2 C), temperature source Oral, resp. rate 20, weight 87.5 kg (192 lb 14.4 oz), SpO2 97.00%. Physical Exam  Eyes:  Right eye blindness  Neck: Normal range of motion. Neck supple. No thyromegaly present.  Cardiovascular: Normal rate and regular rhythm.   Respiratory: Effort normal and breath sounds normal. No respiratory distress.  GI: Soft. Bowel sounds are normal. He exhibits no distension.  Neurological:  Patient is alert and pleasant. He makes eye contact with examiner. Follows simple commands. He was able to provide his name, age date of birth. He did need cues for present year. Mild left sided weakness, left  facial  weakness. Good sitting balance.  Skin: Skin is warm and dry.    Results for orders placed during the hospital encounter of 06/30/14 (from the past 24 hour(s))  GLUCOSE, CAPILLARY     Status: Abnormal   Collection Time    07/01/14  7:41 AM      Result Value Ref Range   Glucose-Capillary 202 (*) 70 - 99 mg/dL  GLUCOSE, CAPILLARY     Status: Abnormal   Collection Time    07/01/14 11:36 AM      Result Value Ref Range   Glucose-Capillary 278 (*) 70 - 99 mg/dL  GLUCOSE, CAPILLARY     Status: Abnormal   Collection Time    07/01/14  5:35 PM      Result Value Ref Range   Glucose-Capillary 224 (*) 70 - 99 mg/dL  GLUCOSE, CAPILLARY     Status: Abnormal   Collection Time    07/01/14  9:21 PM      Result Value Ref Range   Glucose-Capillary 260 (*) 70 - 99 mg/dL   Comment 1 Documented in Chart     Comment 2 Notify RN     Ct Head (brain) Wo Contrast  06/30/2014   CLINICAL DATA:  Facial drooping.  Left-sided weakness .  EXAM: CT HEAD WITHOUT CONTRAST  TECHNIQUE: Contiguous axial images were obtained from the base of the skull through the vertex without intravenous contrast.  COMPARISON:  CT 03/04/2013, 04/30/2011.  FINDINGS: No intra-axial or extra-axial pathologic fluid or blood collection. No mass lesion. No hydrocephalus. White matter changes noted consistent with chronic ischemia. Diffuse cerebral cerebellar atrophy. Increased density noted over the posterior aspect of the globe. This is most consistent with retinal detachment. Noted is a focal area of radiopacity along the posterior optic globe. This is most likely calcification, possibly related to drusen. Foreign body would be less likely given its appearance. Tumor calcification cannot be excluded . Left maxillary sinus and diffuse ethmoidal sinus mucosal thickening .  IMPRESSION: 1. Abnormal right optic globe. 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 Clinical correlation suggested as discussed above. 2. Diffuse cerebral atrophy and white matter changes  consistent with chronic ischemia.   Electronically Signed   By: Marcello Moores  Register   On: 06/30/2014 11:45   Mr Jodene Nam Head Wo Contrast  07/01/2014   CLINICAL DATA:  Left-sided weakness. Available walking communicate. Hyperglycemia.  EXAM: MRI HEAD WITHOUT CONTRAST  MRA HEAD WITHOUT CONTRAST  TECHNIQUE: Multiplanar, multiecho pulse sequences of the brain and surrounding structures were obtained without intravenous contrast. Angiographic images of the head were obtained using MRA technique without contrast.  COMPARISON:  CT head without contrast 06/30/2014. MRI brain 03/04/2013.  FINDINGS: MRI HEAD FINDINGS  Atrophy and extensive white matter changes are again seen. Remote lacunar infarcts are present in the basal ganglia and corona radiata. No acute infarct, hemorrhage, or mass lesion is present. A right-sided retinal detachment is again noted. The globes and orbits are otherwise intact.  Flow is present in the major intracranial arteries. Chronic maxillary sinus disease is worse on the left. Diffuse mucosal thickening is present throughout the ethmoid air cells and sigmoid sphenoid sinuses. Mastoid effusions are more prominent on the left. No obstructing nasopharyngeal lesions are present.  MRA HEAD FINDINGS  The study is mildly degraded by patient motion, degrading resolution of small vessels.  The internal carotid arteries are within normal limits from the high cervical segments through the ICA termini bilaterally. The A1 and M1 segments are within normal limits is well.  The MCA bifurcations are intact. There is mild attenuation of distal MCA branch vessels, worse on the left. A moderate right A2 segment stenosis is present distally.  The left vertebral artery is small. The left vertebral artery is dominant. Both vertebral arteries are small. The left PICA origin is visualized and normal. The right PICA is not visualized. A moderate stenosis is present in the distal right vertebral artery. The basilar artery is  within normal limits. Both posterior cerebral arteries originate from the basilar tip. There is a moderate stenosis in the distal left P2 segment with asymmetric attenuation of PCA branch vessels on the left.  IMPRESSION: 1. No acute intracranial abnormality. 2. Stable atrophy and diffuse white matter disease. 3. Remote lacunar infarcts of the basal ganglia and coronal radiata bilaterally. 4. Remote retinal detachment of the right globe. 5. Moderate stenoses in the distal right A2 segment and distal left P2 segment. 6. Moderate distal small vessel disease.   Electronically Signed   By: Lawrence Santiago M.D.   On: 07/01/2014 20:41   Mr Brain Wo Contrast  07/01/2014   CLINICAL DATA:  Left-sided weakness. Available walking communicate. Hyperglycemia.  EXAM: MRI HEAD WITHOUT CONTRAST  MRA HEAD WITHOUT CONTRAST  TECHNIQUE: Multiplanar, multiecho pulse sequences of the brain and surrounding structures were obtained without intravenous contrast. Angiographic images of the head were obtained using MRA technique without contrast.  COMPARISON:  CT head without contrast 06/30/2014. MRI brain 03/04/2013.  FINDINGS: MRI HEAD FINDINGS  Atrophy and extensive white matter changes are again seen. Remote lacunar infarcts are present in the basal ganglia and corona radiata. No acute infarct, hemorrhage, or mass lesion is present. A right-sided retinal detachment is again noted. The globes and orbits are otherwise intact.  Flow is present in the major intracranial arteries. Chronic maxillary sinus disease is worse on the left. Diffuse mucosal thickening is present throughout the ethmoid air cells and sigmoid sphenoid sinuses. Mastoid effusions are more prominent on the left. No obstructing nasopharyngeal lesions are present.  MRA HEAD FINDINGS  The study is mildly degraded by patient motion, degrading resolution of small vessels.  The internal carotid arteries are within normal limits from the high cervical segments through the ICA  termini bilaterally. The A1 and M1 segments are within normal limits is well. The MCA bifurcations are intact. There is mild attenuation of distal MCA branch vessels, worse on the left. A moderate right A2 segment stenosis is present distally.  The left vertebral artery is small. The left vertebral artery is dominant. Both vertebral arteries are small. The left PICA origin is visualized and normal. The right PICA is not visualized. A moderate stenosis is present in the distal right vertebral artery. The basilar artery is within normal limits. Both posterior cerebral arteries originate from the basilar tip. There is a moderate stenosis in the distal left P2 segment with asymmetric attenuation of PCA branch vessels on the left.  IMPRESSION: 1. No acute intracranial abnormality. 2. Stable atrophy and diffuse white matter disease. 3. Remote lacunar infarcts of the basal ganglia and coronal radiata bilaterally. 4. Remote retinal detachment of the right globe. 5. Moderate stenoses in the distal right A2 segment and distal left P2 segment. 6. Moderate distal small vessel disease.   Electronically Signed   By: Lawrence Santiago M.D.   On: 07/01/2014 20:41   Dg Chest Portable 1 View  06/30/2014   CLINICAL DATA:  Cardiovascular accident.  EXAM: PORTABLE CHEST - 1 VIEW  COMPARISON:  03/04/2013  FINDINGS: Normal heart size and mediastinal contours for technique. No acute infiltrate or edema. No effusion or pneumothorax. No acute osseous findings.  IMPRESSION: No active disease.   Electronically Signed   By: Jorje Guild M.D.   On: 06/30/2014 12:20    Assessment/Plan: Diagnosis: weakness after DKA. Prior cva 1. Does the need for close, 24 hr/day medical supervision in concert with the patient's rehab needs make it unreasonable for this patient to be served in a less intensive setting? No 2. Co-Morbidities requiring supervision/potential complications:   3. Due to bladder management, bowel management, safety and  skin/wound care, does the patient require 24 hr/day rehab nursing? No 4. Does the patient require coordinated care of a physician, rehab nurse, PT, OT to address physical and functional deficits in the context of the above medical diagnosis(es)? No Addressing deficits in the following areas: balance, endurance, locomotion, strength and transferring 5. Can the patient actively participate in an intensive therapy program of at least 3 hrs of therapy per day at least 5 days per week? No 6. The potential for patient to make measurable gains while on inpatient rehab is n;a 7. Anticipated functional outcomes upon discharge from inpatient rehab are n/a  with PT, n/a with OT, n/a with SLP. 8. Estimated rehab length of stay to reach the above functional goals is: n/a 9. Does the patient have adequate social supports to accommodate these discharge functional goals? N/A 10. Anticipated D/C setting: Other 11. Anticipated post D/C treatments: [place] therapy 12. Overall Rehab/Functional Prognosis: excellent  RECOMMENDATIONS: This patient's condition is appropriate for continued rehabilitative care in the following setting: [facility] Therapy Patient has agreed to participate in recommended program. Yes Note that insurance prior authorization may be required for reimbursement for recommended care.  Comment: Pt is near functional baseline.  Meredith Staggers, MD, [place] [facility]     07/02/2014

## 2014-07-02 NOTE — Clinical Social Work Note (Signed)
CSW rec'd referral yesterday for ?SNF- CSW's have attempted to reach patient's daughter via phone and have left messages but no return call in 2 days. CSW now notes PT recommending [place]- CSW will follow up tomorrow for further [place], MSW, [place]

## 2014-07-03 LAB — GLUCOSE, CAPILLARY
Glucose-Capillary: 130 mg/dL — ABNORMAL HIGH (ref 70–99)
Glucose-Capillary: 187 mg/dL — ABNORMAL HIGH (ref 70–99)

## 2014-07-03 LAB — BASIC METABOLIC PANEL
Anion gap: 10 (ref 5–15)
BUN: 11 mg/dL (ref 6–23)
CO2: 24 mEq/L (ref 19–32)
Calcium: 8.6 mg/dL (ref 8.4–10.5)
Chloride: 106 mEq/L (ref 96–112)
Creatinine, Ser: 0.84 mg/dL (ref 0.50–1.35)
GFR calc Af Amer: >90 mL/min (ref >90)
GFR calc non Af Amer: 87 mL/min — ABNORMAL LOW (ref >90)
Glucose, Bld: 212 mg/dL — ABNORMAL HIGH (ref 70–99)
Potassium: 3.4 mEq/L — ABNORMAL LOW (ref 3.7–5.3)
Sodium: 140 mEq/L (ref 137–147)

## 2014-07-03 MED ORDER — CIPROFLOXACIN HCL 500 MG PO TABS: 500.0000 mg | ORAL_TABLET | Freq: Two times a day (BID) | ORAL | Status: DC

## 2014-07-03 MED ORDER — POTASSIUM CHLORIDE CRYS ER 20 MEQ PO TBCR
40.0000 meq | EXTENDED_RELEASE_TABLET | Freq: Once | ORAL | Status: AC
  Administered 2014-07-03: 40 meq via ORAL
  Filled 2014-07-03: qty 2

## 2014-07-03 MED ORDER — SENNA 8.6 MG PO TABS: 1.0000 | ORAL_TABLET | Freq: Every day | ORAL | Status: DC

## 2014-07-03 MED ORDER — POLYETHYLENE GLYCOL 3350 17 G PO PACK: 17.0000 g | PACK | Freq: Two times a day (BID) | ORAL | Status: DC

## 2014-07-03 MED ORDER — INSULIN GLARGINE 100 UNIT/ML ~~LOC~~ SOLN: 16.0000 [IU] | Freq: Every day | SUBCUTANEOUS | Status: DC

## 2014-07-03 MED ORDER — INSULIN ASPART 100 UNIT/ML ~~LOC~~ SOLN: 4.0000 [IU] | Freq: Three times a day (TID) | SUBCUTANEOUS | Status: DC

## 2014-07-03 NOTE — Clinical Social Work Note (Signed)
SNF bed accepted at [facility] where he has been before in 2014 and "did well" per his daughter- will plan transfer via EMS later today-  Eduard Clos, MSW, [place]

## 2014-07-03 NOTE — Discharge Summary (Signed)
Physician Discharge Summary  DARROW BARREIRO KGM:[id number] DOB: [birth year] DOA: 06/30/2014  PCP: [name], MD  Admit date: 06/30/2014 Discharge date: 07/03/2014  Time spent: 35 minutes  Recommendations for Outpatient Follow-up:  Needs education for lantus administration.  Need B-met to follow renal function.  Follow final results of Blood culture.   Discharge Diagnoses:    DKA/Diabetes mellitus, new onset    Left arm weakness/History of CVA    Urinary tract infection   Thrombocytopenia   H/O cocaine abuse   Hypertension   H/O urinary retention   History of Liver abscess   Anemia, iron deficiency   DKA (diabetic ketoacidoses)   Diabetes mellitus, new onset   Left arm weakness   History of CVA      Discharge Condition: Stable.   Diet recommendation: Carb modified.   Filed Weights   07/01/14 0400 07/02/14 0500  Weight: 87.5 kg (192 lb 14.4 oz) 91.536 kg (201 lb 12.8 oz)    History of present illness:  Brad Harrell is a 68 y.o. male  With a history of CVA, hypertension, hypothyroidism other presented to the emergency department questionable stroke. Patient with the daughter noted that he was unable to walk this morning and could not communicate appropriately and had some left-sided weakness. For him to the hospital for possible stroke. Upon arrival to emergency department, patient was found to have a blood sugar in the 700. Patient does not have a history of diabetes. Per the daughter, he has been somewhat tired and different since Sunday. He has not been eating or drinking properly. Patient currently denies pain however nonspecific. He does complain of some left-sided weakness. Patient denies any chest pain, shortness of breath, abdominal pain, nausea, vomiting, diarrhea, constipation, sick contacts, recent travel.   Hospital Course:  Brief narrative:  68 year old male patient with history of prior CVA, hypertension, hypothyroidism, recent treatment for liver abscess  June 2014. He was brought to the emergency department due to complaints of left-sided weakness and fatigue. According to the family he had been tired and different since Sunday prior to presentation. Not been eating or drinking properly. He complained to his family of left-sided weakness.  Upon presentation to the emergency department the patient's blood sugar was noted to be 700. The patient does not carry formal diagnosis of diabetes. Patient's urinalysis was abnormal and concerning for possible urinary tract infection. CT of the head showed no acute changes. 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  Since admission the patient's anion gap has closed and he has been transitioned to subcutaneous Lantus insulin. His hemoglobin A1c has returned back 11.6 and therefore will likely need to discharge on insulin. Hemoglobin A1c in 2012 was 7.1. On exam strength appears to be symmetrical but patient continues to complain of left arm weakness. 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 Occupational therapy has examined the patient and recommend CIR evaluation for possible admission.   HPI/Subjective:  Feeling well, No BM since Sunday.  Weakness left arm improved.   Assessment/Plan:  DKA/Diabetes mellitus, new onset  -off insulin gtt and now on nocturnal Lantus with sliding scale insulin  --hemoglobin A1c 11.6  -Continue with lantus 16 units and 4 units meal coverage.  -He or family member will need to be educated for administration of lantus.   Gram-negative rod urinary tract infection/H/O  urinary retention  -Urine culture (9-01)gram negative rods. Providencia Rettigeri.  -continue with Ceftriaxone day 2.  -Blood culture: no growth to date.   Left arm weakness/History of CVA  -CT head negative-an MRI is negative for  acute stroke.  -PT OT following and recommendation is for CIR so consultation placed  -weakness symptoms could be just related to DKA and deconditioned.  -continue aspirin and Plavix-exam not impressive for acute CVA  -Doppler: Left 1-39% ICA stenosis. Vertebral artery flow is antegrade.   Thrombocytopenia  -Unclear etiology but given concerns for UTI could be related to gram-negative infection  -stable.   H/O cocaine abuse  -Urine drug screen negative at presentation   Hypertension  -Blood pressure remains soft-continue carvedilol and Cardura cautiously   History of cardiomyopathy  -Echocardiogram this admission with diffuse hypokinesis with an EF of 30-35% as well as grade 1 diastolic dysfunction-this is unchanged from previous echo in 2010-appears compensated without evidence of systolic heart failure   History of Liver abscess  -Post percutaneous drainage in interventional radiology (in June 2014) as well as long-term antibiotics completed in July   Hypothyroidism  -Continue Synthroid   Anemia, iron deficiency  -Baseline hemoglobin around 9-was quite hemoconcentrated at presentation with a hemoglobin of 14.9 likely due to dehydration-hemoglobin is drifted down to 12 with hydration   Procedures: 2-D echocardiogram with stable findings of diffuse hypokinesis, systolic dysfunction with EF 16-60%, grade 1 diastolic dysfunction   Consultations:  none  Discharge Exam: Filed Vitals:   07/03/14 0924  BP: 146/73  Pulse: 73  Temp: 98.1 F (36.7 C)  Resp: 16    General: Alert in no distress.  Cardiovascular: S 1, S 2 RRR Respiratory: CTA Neuro; chronic left side weakness.    Discharge Instructions You were cared for by a hospitalist during your hospital stay. If you have any questions about your discharge medications or the care you received while you were in the hospital after you are discharged, you can call the unit and asked to speak with the hospitalist on call if  the hospitalist that took care of you is not available. Once you are discharged, your primary care physician will handle any further medical issues. Please note that NO REFILLS for any discharge medications will be authorized once you are discharged, as it is imperative that you return to your primary care physician (or establish a relationship with a primary care physician if you do not have one) for your aftercare needs so that they can reassess your need for medications and monitor your lab values.  Discharge Instructions   Diet - low sodium heart healthy    Complete by:  As directed      Increase activity slowly    Complete by:  As directed           Current Discharge Medication List    START taking these medications   Details  ciprofloxacin (CIPRO) 500 MG tablet Take 1 tablet (500 mg total) by mouth 2 (two) times daily. Qty: 6 tablet, Refills: 0    insulin aspart (NOVOLOG) 100 UNIT/ML injection Inject 4 Units into the skin 3 (three) times daily with meals. Qty: 10 mL, Refills: 11    insulin glargine (LANTUS) 100 UNIT/ML injection Inject 0.16 mLs (16 Units total) into the skin at bedtime. Qty: 10 mL, Refills: 11    polyethylene glycol (MIRALAX / GLYCOLAX) packet Take 17 g by mouth 2 (two) times daily. Qty: 14 each, Refills: 0    senna (SENOKOT) 8.6  MG TABS tablet Take 1 tablet (8.6 mg total) by mouth daily. Qty: 120 each, Refills: 0      CONTINUE these medications which have NOT CHANGED   Details  aspirin EC 81 MG tablet Take 81 mg by mouth daily.    carvedilol (COREG) 3.125 MG tablet Take 2 tablets (6.25 mg total) by mouth 2 (two) times daily with a meal. Qty: 120 tablet, Refills: 1    clopidogrel (PLAVIX) 75 MG tablet Take 75 mg by mouth daily.    doxazosin (CARDURA) 4 MG tablet Take 8 mg by mouth at bedtime.     levothyroxine (SYNTHROID, LEVOTHROID) 100 MCG tablet Take 100 mcg by mouth daily.    simvastatin (ZOCOR) 40 MG tablet Take 40 mg by mouth every evening.        No Known Allergies Follow-up Information   Follow up with AVBUERE,EDWIN A, MD In 1 week.   Specialty:  Internal Medicine   Contact information:   [address] [place] [address] [phone]        The results of significant diagnostics from this hospitalization (including imaging, microbiology, ancillary and laboratory) are listed below for reference.    Significant Diagnostic Studies: Ct Head (brain) Wo Contrast  06/30/2014   CLINICAL DATA:  Facial drooping.  Left-sided weakness .  EXAM: CT HEAD WITHOUT CONTRAST  TECHNIQUE: Contiguous axial images were obtained from the base of the skull through the vertex without intravenous contrast.  COMPARISON:  CT 03/04/2013, 04/30/2011.  FINDINGS: No intra-axial or extra-axial pathologic fluid or blood collection. No mass lesion. No hydrocephalus. White matter changes noted consistent with chronic ischemia. Diffuse cerebral cerebellar atrophy. Increased density noted over the posterior aspect of the globe. This is most consistent with retinal detachment. Noted is a focal area of radiopacity along the posterior optic globe. This is most likely calcification, possibly related to drusen. Foreign body would be less likely given its appearance. Tumor calcification cannot be excluded . Left maxillary sinus and diffuse ethmoidal sinus mucosal thickening .  IMPRESSION: 1. Abnormal right optic globe. 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 Clinical correlation suggested as discussed above. 2. Diffuse cerebral atrophy and white matter changes consistent with chronic ischemia.   Electronically Signed   By: Marcello Moores  Register   On: 06/30/2014 11:45   Mr Jodene Nam Head Wo Contrast  07/01/2014   CLINICAL DATA:  Left-sided weakness. Available walking communicate. Hyperglycemia.  EXAM: MRI HEAD WITHOUT CONTRAST  MRA HEAD WITHOUT CONTRAST  TECHNIQUE: Multiplanar, multiecho pulse sequences of the brain and surrounding structures were obtained without intravenous contrast.  Angiographic images of the head were obtained using MRA technique without contrast.  COMPARISON:  CT head without contrast 06/30/2014. MRI brain 03/04/2013.  FINDINGS: MRI HEAD FINDINGS  Atrophy and extensive white matter changes are again seen. Remote lacunar infarcts are present in the basal ganglia and corona radiata. No acute infarct, hemorrhage, or mass lesion is present. A right-sided retinal detachment is again noted. The globes and orbits are otherwise intact.  Flow is present in the major intracranial arteries. Chronic maxillary sinus disease is worse on the left. Diffuse mucosal thickening is present throughout the ethmoid air cells and sigmoid sphenoid sinuses. Mastoid effusions are more prominent on the left. No obstructing nasopharyngeal lesions are present.  MRA HEAD FINDINGS  The study is mildly degraded by patient motion, degrading resolution of small vessels.  The internal carotid arteries are within normal limits from the high cervical segments through the ICA termini bilaterally. The A1 and M1 segments are  within normal limits is well. The MCA bifurcations are intact. There is mild attenuation of distal MCA branch vessels, worse on the left. A moderate right A2 segment stenosis is present distally.  The left vertebral artery is small. The left vertebral artery is dominant. Both vertebral arteries are small. The left PICA origin is visualized and normal. The right PICA is not visualized. A moderate stenosis is present in the distal right vertebral artery. The basilar artery is within normal limits. Both posterior cerebral arteries originate from the basilar tip. There is a moderate stenosis in the distal left P2 segment with asymmetric attenuation of PCA branch vessels on the left.  IMPRESSION: 1. No acute intracranial abnormality. 2. Stable atrophy and diffuse white matter disease. 3. Remote lacunar infarcts of the basal ganglia and coronal radiata bilaterally. 4. Remote retinal detachment of the  right globe. 5. Moderate stenoses in the distal right A2 segment and distal left P2 segment. 6. Moderate distal small vessel disease.   Electronically Signed   By: Lawrence Santiago M.D.   On: 07/01/2014 20:41   Mr Brain Wo Contrast  07/01/2014   CLINICAL DATA:  Left-sided weakness. Available walking communicate. Hyperglycemia.  EXAM: MRI HEAD WITHOUT CONTRAST  MRA HEAD WITHOUT CONTRAST  TECHNIQUE: Multiplanar, multiecho pulse sequences of the brain and surrounding structures were obtained without intravenous contrast. Angiographic images of the head were obtained using MRA technique without contrast.  COMPARISON:  CT head without contrast 06/30/2014. MRI brain 03/04/2013.  FINDINGS: MRI HEAD FINDINGS  Atrophy and extensive white matter changes are again seen. Remote lacunar infarcts are present in the basal ganglia and corona radiata. No acute infarct, hemorrhage, or mass lesion is present. A right-sided retinal detachment is again noted. The globes and orbits are otherwise intact.  Flow is present in the major intracranial arteries. Chronic maxillary sinus disease is worse on the left. Diffuse mucosal thickening is present throughout the ethmoid air cells and sigmoid sphenoid sinuses. Mastoid effusions are more prominent on the left. No obstructing nasopharyngeal lesions are present.  MRA HEAD FINDINGS  The study is mildly degraded by patient motion, degrading resolution of small vessels.  The internal carotid arteries are within normal limits from the high cervical segments through the ICA termini bilaterally. The A1 and M1 segments are within normal limits is well. The MCA bifurcations are intact. There is mild attenuation of distal MCA branch vessels, worse on the left. A moderate right A2 segment stenosis is present distally.  The left vertebral artery is small. The left vertebral artery is dominant. Both vertebral arteries are small. The left PICA origin is visualized and normal. The right PICA is not  visualized. A moderate stenosis is present in the distal right vertebral artery. The basilar artery is within normal limits. Both posterior cerebral arteries originate from the basilar tip. There is a moderate stenosis in the distal left P2 segment with asymmetric attenuation of PCA branch vessels on the left.  IMPRESSION: 1. No acute intracranial abnormality. 2. Stable atrophy and diffuse white matter disease. 3. Remote lacunar infarcts of the basal ganglia and coronal radiata bilaterally. 4. Remote retinal detachment of the right globe. 5. Moderate stenoses in the distal right A2 segment and distal left P2 segment. 6. Moderate distal small vessel disease.   Electronically Signed   By: Lawrence Santiago M.D.   On: 07/01/2014 20:41   Dg Chest Portable 1 View  06/30/2014   CLINICAL DATA:  Cardiovascular accident.  EXAM: PORTABLE CHEST - 1 VIEW  COMPARISON:  03/04/2013  FINDINGS: Normal heart size and mediastinal contours for technique. No acute infiltrate or edema. No effusion or pneumothorax. No acute osseous findings.  IMPRESSION: No active disease.   Electronically Signed   By: Jorje Guild M.D.   On: 06/30/2014 12:20    Microbiology: Recent Results (from the past 240 hour(s))  URINE CULTURE     Status: None   Collection Time    06/30/14 12:13 PM      Result Value Ref Range Status   Specimen Description URINE, RANDOM   Final   Special Requests NONE   Final   Culture  Setup Time     Final   Value: 06/30/2014 17:38     Performed at [facility]     Final   Value: >=100,000 COLONIES/ML     Performed at [facility]   Culture     Final   Value: PROVIDENCIA RETTGERI     Performed at [facility]   Report Status 07/02/2014 FINAL   Final   Organism ID, Bacteria PROVIDENCIA RETTGERI   Final  MRSA PCR SCREENING     Status: None   Collection Time    06/30/14  3:49 PM      Result Value Ref Range Status   MRSA by PCR NEGATIVE  NEGATIVE Final   Comment:             The GeneXpert MRSA Assay (FDA     approved for NASAL specimens     only), is one component of a     comprehensive MRSA colonization     surveillance program. It is not     intended to diagnose MRSA     infection nor to guide or     monitor treatment for     MRSA infections.  URINE CULTURE     Status: None   Collection Time    06/30/14  9:00 PM      Result Value Ref Range Status   Specimen Description URINE, CLEAN CATCH   Final   Special Requests NONE   Final   Culture  Setup Time     Final   Value: 06/30/2014 22:06     Performed at [place]     Final   Value: NO GROWTH     Performed at [facility]   Culture     Final   Value: NO GROWTH     Performed at [facility]   Report Status 07/01/2014 FINAL   Final  CULTURE, BLOOD (ROUTINE X 2)     Status: None   Collection Time    07/01/14  1:39 PM      Result Value Ref Range Status   Specimen Description BLOOD RIGHT ARM   Final   Special Requests BOTTLES DRAWN AEROBIC AND ANAEROBIC 10CC   Final   Culture  Setup Time     Final   Value: 07/01/2014 19:40     Performed at [facility]   Culture     Final   Value:        BLOOD CULTURE RECEIVED NO GROWTH TO DATE CULTURE WILL BE HELD FOR 5 DAYS BEFORE ISSUING A FINAL NEGATIVE REPORT     Performed at [facility]   Report Status PENDING   Incomplete  CULTURE, BLOOD (ROUTINE X 2)     Status: None   Collection Time    07/01/14  1:44 PM      Result Value Ref Range Status   Specimen Description BLOOD RIGHT HAND   Final   Special Requests BOTTLES DRAWN AEROBIC ONLY 2CC   Final   Culture  Setup Time     Final   Value: 07/01/2014 19:40     Performed at [facility]   Culture     Final   Value:        BLOOD CULTURE RECEIVED NO GROWTH TO DATE CULTURE WILL BE HELD FOR 5 DAYS BEFORE ISSUING A FINAL NEGATIVE REPORT     Performed at [facility]   Report Status PENDING   Incomplete     Labs: Basic Metabolic  Panel:  Recent Labs Lab 06/30/14 1220 06/30/14 1647 06/30/14 2105 07/01/14 0010 07/01/14 0411 07/02/14 0620  NA 135*  --  142 146 145 141  K 6.6*  --  3.8 4.3 4.0 3.6*  CL 98  --  108 112 111 107  CO2 11*  --  19 20 21 21   GLUCOSE 555*  --  204* 106* 113* 186*  BUN 17  --  15 15 15 13   CREATININE 1.29 1.16 1.03 0.99 1.01 0.89  CALCIUM 9.0  --  8.9 8.9 9.0 8.2*   Liver Function Tests:  Recent Labs Lab 06/30/14 1220  AST 12  ALT 10  ALKPHOS 71  BILITOT 1.6*  PROT 8.0  ALBUMIN 3.2*   No results found for this basename: LIPASE, AMYLASE,  in the last 168 hours No results found for this basename: AMMONIA,  in the last 168 hours CBC:  Recent Labs Lab 06/30/14 1044 06/30/14 1647 07/01/14 0130 07/02/14 0620  WBC 16.2* 14.5* 12.5* 6.6  HGB 14.9 12.3* 12.1* 10.5*  HCT 44.1 36.9* 35.8* 31.9*  MCV 87.3 87.2 85.0 85.8  PLT PLATELET CLUMPS NOTED ON SMEAR, COUNT APPEARS ADEQUATE 141* 147* 133*   Cardiac Enzymes:  Recent Labs Lab 06/30/14 1220  TROPONINI <0.30   BNP: BNP (last 3 results) No results found for this basename: PROBNP,  in the last 8760 hours CBG:  Recent Labs Lab 07/02/14 0656 07/02/14 1131 07/02/14 1646 07/02/14 2120 07/03/14 0637  GLUCAP 166* 224* 166* 162* 130*       Signed:  Jocob Dambach A  [facility] 07/03/2014, 9:39 AM

## 2014-07-03 NOTE — Progress Notes (Signed)
Occupational Therapy Treatment Patient Details Name: Brad Harrell MRN: [id number] DOB: 1946-07-03 Today's Date: 07/03/2014    History of present illness  68 y.o. male admitted with difficulty ambulating, impaired communication, Lt sided weakness. In ED he was found to have BS 700+. CT of head with no acute changes. MRI (-) No acute intracranial abnormality. Remote lacunar infarcts of the basal ganglia and coronal radiata bilaterally.  Remote retinal detachment of the right globe.  Pt diagnosed with DKA; sepsis secondary to DKA and UTI; hyperkalemia; HTN; PMH includes: urinary retention; cardiomyopathy; h/o cocaine abuse; CVA with Lt. sided weakness 03/04/13; liver abcess; anemia    OT comments  Pt completed ADL at sink level in seated position. Pt demonstrates LOB entering bathroom with RW. Pt remains unsafe to complete adls without (A) at this time. Recommend 1 person (A) with Conservation officer, historic buildings. OT to follow acutely and d/c plan updated.    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 per pt       Mobility Bed Mobility Overal bed mobility: Modified Independent                Transfers Overall transfer level: Needs assistance Equipment used: Rolling walker (2 wheeled) Transfers: Sit to/from Stand Sit to Stand: Min assist              Balance Overall balance assessment: Needs assistance         Standing balance support: Bilateral upper extremity supported;During functional activity Standing balance-Leahy Scale: Poor                     ADL Overall ADL's : Needs assistance/impaired     Grooming: Oral care;Wash/dry face;Wash/dry hands;Min guard;Sitting Grooming Details (indicate cue type and reason): at sink on bsc sitting Upper Body Bathing: Set up;Sitting    Lower Body Bathing: Minimal assistance;Sit to/from stand   Upper Body Dressing : Minimal assistance;Sitting       Toilet Transfer: Min guard;BSC;RW           Functional mobility during ADLs: Min guard;Rolling walker General ADL Comments: Pt with LOB entering bathroom surface due to threshold height. Pt requires cues for safety throughout session. pt at first reluctant to engage in bathing but afterward thanking therapist for helping. Pt reports "feel better now"      Vision                     Perception     Praxis      Cognition   Behavior During Therapy: Impulsive Overall Cognitive Status: Impaired/Different from baseline Area of Impairment: Attention;Following commands;Safety/judgement;Awareness;Problem solving   Current Attention Level: Selective Memory: Decreased short-term memory  Following Commands: Follows multi-step commands with increased time Safety/Judgement: Decreased awareness of deficits Awareness: Anticipatory Problem Solving: Slow processing General Comments: Pt requires mod cues during session for safety and to stay inside RW. Pt attempting to abandon RW several times. pt self reports balance deficits    Extremity/Trunk Assessment               Exercises     Shoulder Instructions       General Comments      Pertinent Vitals/ Pain       Pain Assessment: No/denies pain  Home Living  Prior Functioning/Environment              Frequency Min 2X/week     Progress Toward Goals  OT Goals(current goals can now be found in the care plan section)  Progress towards OT goals: Progressing toward goals  Acute Rehab OT Goals Patient Stated Goal: to go home today OT Goal Formulation: With patient Time For Goal Achievement: 07/15/14 Potential to Achieve Goals: Good ADL Goals Pt Will Perform Grooming: standing;with min guard assist Pt Will Perform Lower Body Bathing:  with min guard assist;sit to/from stand Pt Will Perform Upper Body Dressing: with supervision;sitting Pt Will Perform Lower Body Dressing: with min guard assist;sit to/from stand Pt Will Transfer to Toilet: with min guard assist;ambulating;regular height toilet;grab bars Pt Will Perform Toileting - Clothing Manipulation and hygiene: with min guard assist;sit to/from stand  Plan Discharge plan needs to be updated    Co-evaluation                 End of Session Equipment Utilized During Treatment: Rolling walker   Activity Tolerance Patient tolerated treatment well   Patient Left in bed;with call bell/phone within reach;with bed alarm set   Nurse Communication Mobility status;Precautions        Time: 1039-1100 OT Time Calculation (min): 21 min  Charges: OT General Charges $OT Visit: 1 Procedure OT Treatments $Self Care/Home Management : 8-22 mins  Parke Poisson B 07/03/2014, 11:18 AM Pager: [phone]

## 2014-07-03 NOTE — Clinical Social Work Placement (Signed)
Clinical Social Work Department CLINICAL SOCIAL WORK PLACEMENT NOTE 07/03/2014  Patient:  Brad Harrell, Brad Harrell  Account Number:  [id number] Admit date:  06/30/2014  Clinical Social Worker:  Daiva Huge  Date/time:  07/03/2014 10:48 AM  Clinical Social Work is seeking post-discharge placement for this patient at the following level of care:   SKILLED NURSING   (*CSW will update this form in Epic as items are completed)   07/03/2014  Patient/family provided with [place] Department of Clinical Social Work's list of facilities offering this level of care within the geographic area requested by the patient (or if unable, by the patient's family).  07/03/2014  Patient/family informed of their freedom to choose among providers that offer the needed level of care, that participate in Medicare, Medicaid or managed care program needed by the patient, have an available bed and are willing to accept the patient.  07/03/2014  Patient/family informed of [facility]' ownership interest in [facility], as well as of the fact that they are under no obligation to receive care at this facility.  PASARR submitted to EDS on 07/03/2014 PASARR number received on 07/03/2014  FL2 transmitted to all facilities in geographic area requested by pt/family on  07/03/2014 FL2 transmitted to all facilities within larger geographic area on   Patient informed that his/her managed care company has contracts with or will negotiate with  certain facilities, including the following:     Patient/family informed of bed offers received:  07/03/2014 Patient chooses bed at [facility] [place] Physician recommends and patient chooses bed at    Patient to be transferred to [facility] on  07/03/2014 Patient to be transferred to facility by ems Patient and family notified of transfer on 07/03/2014 Name of family member notified:  daughter [name] and patient  The following  physician request were entered in Epic:   Additional Comments: Eduard Clos, MSW, [place] [phone]

## 2014-07-03 NOTE — Clinical Social Work Placement (Signed)
Clinical Social Work Department CLINICAL SOCIAL WORK PLACEMENT NOTE 07/03/2014  Patient:  Brad Harrell, Brad Harrell  Account Number:  [id number] Admit date:  06/30/2014  Clinical Social Worker:  Daiva Huge  Date/time:  07/03/2014 10:48 AM  Clinical Social Work is seeking post-discharge placement for this patient at the following level of care:   SKILLED NURSING   (*CSW will update this form in Epic as items are completed)   07/03/2014  Patient/family provided with [place] Department of Clinical Social Work's list of facilities offering this level of care within the geographic area requested by the patient (or if unable, by the patient's family).  07/03/2014  Patient/family informed of their freedom to choose among providers that offer the needed level of care, that participate in Medicare, Medicaid or managed care program needed by the patient, have an available bed and are willing to accept the patient.  07/03/2014  Patient/family informed of [facility]' ownership interest in [facility], as well as of the fact that they are under no obligation to receive care at this facility.  PASARR submitted to EDS on 07/03/2014 PASARR number received on 07/03/2014  FL2 transmitted to all facilities in geographic area requested by pt/family on  07/03/2014 FL2 transmitted to all facilities within larger geographic area on   Patient informed that his/her managed care company has contracts with or will negotiate with  certain facilities, including the following:     Patient/family informed of bed offers received:   Patient chooses bed at  Physician recommends and patient chooses bed at    Patient to be transferred to  on   Patient to be transferred to facility by  Patient and family notified of transfer on  Name of family member notified:    The following physician request were entered in Epic:   Additional Comments:  Eduard Clos, MSW, [place]

## 2014-07-03 NOTE — Clinical Social Work Psychosocial (Signed)
Clinical Social Work Department BRIEF PSYCHOSOCIAL ASSESSMENT 07/03/2014  Patient:  Brad Harrell, Brad Harrell     Account Number:  [id number]     Admit date:  06/30/2014  Clinical Social Worker:  Daiva Huge  Date/Time:  07/03/2014 10:40 AM  Referred by:  Physician  Date Referred:  07/01/2014 Referred for  SNF Placement   Other Referral:   Interview type:  Other - See comment Other interview type:   Spoke with patient and daughter via phone    PSYCHOSOCIAL DATA Living Status:  ALONE Admitted from facility:   Level of care:   Primary support name:  Brad Harrell Primary support relationship to patient:  FAMILY Degree of support available:   good    CURRENT CONCERNS Current Concerns  Post-Acute Placement   Other Concerns:    SOCIAL WORK ASSESSMENT / PLAN Spoke with patient and daughter by phone- they are both agreeable to SNF search for short term rehab- daughter stressing that he will need to be reassured this is for short term stay and not long term- "he's afraid I'm gonna put him away"    CSW spoke with patient who was ok with the plan- "can we go today?"  Advised him it should be today and he is in agreement- he had no preference as to where he might go- daughter reports he was at [facility] before-   Assessment/plan status:  Other - See comment Other assessment/ plan:   FL2 updated for SNF search   Information/referral to community resources:   SNF list    PATIENT'S/FAMILY'S RESPONSE TO PLAN OF CARE: Both patient and daughter are agreeable to plans for SNF stay- hopeful for beds today and d/c today. CSW will advise       Eduard Clos, MSW, [place] [phone]

## 2014-07-03 NOTE — Progress Notes (Signed)
Discharge orders received, pt for discharge today to [facility].  IV and telemetry D/C.  D/C instructions (including Living with Diabetes as well as the insulins starter kit) and Rx in packet for receiving facility and report called to SNF.  Family at the bedside to assist with discharge. PTAR brought pt downstairs via stretcher.

## 2014-07-07 ENCOUNTER — Encounter: Payer: Self-pay | Admitting: Internal Medicine

## 2014-07-07 ENCOUNTER — Non-Acute Institutional Stay (SKILLED_NURSING_FACILITY): Payer: Medicare Other | Admitting: Internal Medicine

## 2014-07-07 DIAGNOSIS — E785 Hyperlipidemia, unspecified: Secondary | ICD-10-CM

## 2014-07-07 DIAGNOSIS — E119 Type 2 diabetes mellitus without complications: Secondary | ICD-10-CM

## 2014-07-07 DIAGNOSIS — I10 Essential (primary) hypertension: Secondary | ICD-10-CM

## 2014-07-07 DIAGNOSIS — D696 Thrombocytopenia, unspecified: Secondary | ICD-10-CM

## 2014-07-07 DIAGNOSIS — E1310 Other specified diabetes mellitus with ketoacidosis without coma: Secondary | ICD-10-CM

## 2014-07-07 DIAGNOSIS — N390 Urinary tract infection, site not specified: Secondary | ICD-10-CM

## 2014-07-07 DIAGNOSIS — E1110 Type 2 diabetes mellitus with ketoacidosis without coma: Secondary | ICD-10-CM

## 2014-07-07 DIAGNOSIS — I429 Cardiomyopathy, unspecified: Secondary | ICD-10-CM

## 2014-07-07 DIAGNOSIS — I6350 Cerebral infarction due to unspecified occlusion or stenosis of unspecified cerebral artery: Secondary | ICD-10-CM

## 2014-07-07 DIAGNOSIS — Z87898 Personal history of other specified conditions: Secondary | ICD-10-CM

## 2014-07-07 DIAGNOSIS — Z87448 Personal history of other diseases of urinary system: Secondary | ICD-10-CM

## 2014-07-07 DIAGNOSIS — I428 Other cardiomyopathies: Secondary | ICD-10-CM

## 2014-07-07 DIAGNOSIS — I639 Cerebral infarction, unspecified: Secondary | ICD-10-CM

## 2014-07-07 LAB — CULTURE, BLOOD (ROUTINE X 2)
CULTURE: NO GROWTH
Culture: NO GROWTH

## 2014-07-07 NOTE — Progress Notes (Signed)
Patient ID: Brad Harrell, male   DOB: [birth year], 68 y.o.   MRN: [id number]  Provider:  Rexene Edison. [name], D.O., C.M.D. Location:  [facility]  PCP: [name], MD  Code Status: full code  No Known Allergies  Chief Complaint  Patient presents with  . New Admit To SNF    s/p hospitalization with new onset DMII, in DKA, had UTI    HPI: 68 y.o. male with h/o prior stroke with left arm weakness on asa and plavix, hypothyroidism, cocaine abuse (last in 2014), liver abscess 2014, iron deficiency anemia, thrombocytopenia, and cardiomyopathy was admitted here for short term rehab s/p hospitalization with one day of unusual behavior, fatigue and poor po intake.  He was found to be in DKA with glucose 700 and hba1c 11.6.  He was treated for his DKA and put on lantus 16 units with 4 units novolog meal coverage.  He needs additional diabetes education as do his family members that help him.    He was found to have a providencia gram negative UTI and treated with two days of rocephin and then changed to 3 additional days of cipro which he completed yesterday, 9/7.  His blood cultures remain preliminarily pending.  He had no new stroke findings.  He had evidence of both grade 1 diastolic dysfunction and systolic dysfunction on his echocardiogram.    ROS: Review of Systems  Constitutional: Negative for fever.       Appetite not back to normal yet  HENT: Negative for congestion.   Eyes: Positive for blurred vision.       Right eye blind (detached)  Respiratory: Negative for cough and shortness of breath.   Cardiovascular: Negative for chest pain and leg swelling.  Gastrointestinal: Negative for constipation.  Genitourinary: Negative for dysuria, urgency and frequency.  Musculoskeletal: Negative for falls.  Skin: Negative for rash.  Neurological: Negative for dizziness.  Psychiatric/Behavioral: Negative for depression and memory loss.     Past Medical History  Diagnosis  Date  . Hypertension   . Urinary retention   . Hyperlipidemia   . Cardiomyopathy   . H/O cocaine abuse   . Hypothyroidism   . Stroke     "weaker on my left side since" (03/04/2013)  . Liver abscess     measuring 7 x 9 cm/notes 03/04/2013  . Anemia     Iron Defficiency  . Borderline diabetes    Past Surgical History  Procedure Laterality Date  . Appendectomy    . Inguinal hernia repair Right   . Tibia fracture surgery Left   . Colonoscopy N/A 03/11/2013    Procedure: COLONOSCOPY;  Surgeon: Beryle Beams, MD;  Location: [facility] ENDOSCOPY;  Service: Endoscopy;  Laterality: N/A;  . Esophagogastroduodenoscopy N/A 03/11/2013    Procedure: ESOPHAGOGASTRODUODENOSCOPY (EGD);  Surgeon: Beryle Beams, MD;  Location: [facility] ENDOSCOPY;  Service: Endoscopy;  Laterality: N/A;  . Givens capsule study N/A 03/12/2013    Procedure: GIVENS CAPSULE STUDY;  Surgeon: Beryle Beams, MD;  Location: [place];  Service: Endoscopy;  Laterality: N/A;   Social History:   reports that he has quit smoking. His smoking use included Cigarettes. He has a 51 pack-year smoking history. He has never used smokeless tobacco. He reports that he uses illicit drugs (Cocaine). He reports that he does not drink alcohol.  No family history on file.  Medications: Patient's Medications  New Prescriptions   No medications on file  Previous Medications   ASPIRIN EC 81  MG TABLET    Take 81 mg by mouth daily.   CARVEDILOL (COREG) 3.125 MG TABLET    Take 2 tablets (6.25 mg total) by mouth 2 (two) times daily with a meal.   CLOPIDOGREL (PLAVIX) 75 MG TABLET    Take 75 mg by mouth daily.   DOXAZOSIN (CARDURA) 4 MG TABLET    Take 8 mg by mouth at bedtime.    INSULIN ASPART (NOVOLOG) 100 UNIT/ML INJECTION    Inject 4 Units into the skin 3 (three) times daily with meals.   INSULIN GLARGINE (LANTUS) 100 UNIT/ML INJECTION    Inject 0.16 mLs (16 Units total) into the skin at bedtime.   LEVOTHYROXINE (SYNTHROID, LEVOTHROID) 100 MCG TABLET     Take 100 mcg by mouth daily.   POLYETHYLENE GLYCOL (MIRALAX / GLYCOLAX) PACKET    Take 17 g by mouth 2 (two) times daily.   SENNA (SENOKOT) 8.6 MG TABS TABLET    Take 1 tablet (8.6 mg total) by mouth daily.   SIMVASTATIN (ZOCOR) 40 MG TABLET    Take 40 mg by mouth every evening.  Modified Medications   No medications on file  Discontinued Medications   CIPROFLOXACIN (CIPRO) 500 MG TABLET    Take 1 tablet (500 mg total) by mouth 2 (two) times daily.     Physical Exam: Filed Vitals:   07/07/14 0916  BP: 126/67  Pulse: 81  Temp: 98.1 F (36.7 C)  Resp: 19  Height: 5\' 5"  (1.651 m)  Weight: 200 lb (90.719 kg)  SpO2: 97%  Physical Exam  Constitutional: He is oriented to person, place, and time. He appears well-developed and well-nourished. No distress.  HENT:  Head: Normocephalic and atraumatic.  Right Ear: External ear normal.  Left Ear: External ear normal.  Nose: Nose normal.  Mouth/Throat: Oropharynx is clear and moist.  Eyes:  Right eye with glassy appearance, keeps closed much of the time  Neck: Neck supple. No JVD present.  Cardiovascular: Normal rate, regular rhythm, normal heart sounds and intact distal pulses.   Pulmonary/Chest: Effort normal and breath sounds normal. No respiratory distress.  Abdominal: Soft. Bowel sounds are normal. He exhibits distension. He exhibits no mass. There is no tenderness.  Musculoskeletal: Normal range of motion. He exhibits no edema and no tenderness.  Neurological: He is alert and oriented to person, place, and time.  5/5 strength of all 4 extremities (could not detect his LUE weakness)  Skin: Skin is warm and dry.  Psychiatric: He has a normal mood and affect.     Labs reviewed: Basic Metabolic Panel:  Recent Labs  07/01/14 0411 07/02/14 0620 07/03/14 1032  NA 145 141 140  K 4.0 3.6* 3.4*  CL 111 107 106  CO2 21 21 24   GLUCOSE 113* 186* 212*  BUN 15 13 11   CREATININE 1.01 0.89 0.84  CALCIUM 9.0 8.2* 8.6   Liver  Function Tests:  Recent Labs  06/30/14 1220  AST 12  ALT 10  ALKPHOS 71  BILITOT 1.6*  PROT 8.0  ALBUMIN 3.2*  CBC:  Recent Labs  06/30/14 1647 07/01/14 0130 07/02/14 0620  WBC 14.5* 12.5* 6.6  HGB 12.3* 12.1* 10.5*  HCT 36.9* 35.8* 31.9*  MCV 87.2 85.0 85.8  PLT 141* 147* 133*   Cardiac Enzymes:  Recent Labs  06/30/14 1220  TROPONINI <0.30  CBG:  Recent Labs  07/02/14 2120 07/03/14 0637 07/03/14 1119  GLUCAP 162* 130* 187*   Lab Results  Component Value Date  HGBA1C 11.4* 06/30/2014   Lab Results  Component Value Date   CHOL 115 06/30/2014   HDL 30* 06/30/2014   LDLCALC 59 06/30/2014   TRIG 130 06/30/2014   CHOLHDL 3.8 06/30/2014   No results found for this basename: MICROALBUR, [id number]    Imaging and Procedures: 2-D echocardiogram with stable findings of diffuse hypokinesis, systolic dysfunction with EF 16-96%, grade 1 diastolic dysfunction  Ct Head (brain) Wo Contrast  06/30/2014 CLINICAL DATA: Facial drooping. Left-sided weakness . EXAM: CT HEAD WITHOUT CONTRAST TECHNIQUE: Contiguous axial images were obtained from the base of the skull through the vertex without intravenous contrast. COMPARISON: CT 03/04/2013, 04/30/2011. FINDINGS: No intra-axial or extra-axial pathologic fluid or blood collection. No mass lesion. No hydrocephalus. White matter changes noted consistent with chronic ischemia. Diffuse cerebral cerebellar atrophy. Increased density noted over the posterior aspect of the globe. This is most consistent with retinal detachment. Noted is a focal area of radiopacity along the posterior optic globe. This is most likely calcification, possibly related to drusen. Foreign body would be less likely given its appearance. Tumor calcification cannot be excluded . Left maxillary sinus and diffuse ethmoidal sinus mucosal thickening . IMPRESSION: 1. Abnormal right optic globe. 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 Clinical correlation suggested as discussed above. 2. Diffuse  cerebral atrophy and white matter changes consistent with chronic ischemia. Electronically Signed By: Marcello Moores Register On: 06/30/2014 11:45    Mr and MRA Brain Wo Contrast  07/01/2014 CLINICAL DATA: Left-sided weakness. Available walking communicate. Hyperglycemia. EXAM: MRI HEAD WITHOUT CONTRAST MRA HEAD WITHOUT CONTRAST TECHNIQUE: Multiplanar, multiecho pulse sequences of the brain and surrounding structures were obtained without intravenous contrast. Angiographic images of the head were obtained using MRA technique without contrast. COMPARISON: CT head without contrast 06/30/2014. MRI brain 03/04/2013. FINDINGS: MRI HEAD FINDINGS Atrophy and extensive white matter changes are again seen. Remote lacunar infarcts are present in the basal ganglia and corona radiata. No acute infarct, hemorrhage, or mass lesion is present. A right-sided retinal detachment is again noted. The globes and orbits are otherwise intact. Flow is present in the major intracranial arteries. Chronic maxillary sinus disease is worse on the left. Diffuse mucosal thickening is present throughout the ethmoid air cells and sigmoid sphenoid sinuses. Mastoid effusions are more prominent on the left. No obstructing nasopharyngeal lesions are present. MRA HEAD FINDINGS The study is mildly degraded by patient motion, degrading resolution of small vessels. The internal carotid arteries are within normal limits from the high cervical segments through the ICA termini bilaterally. The A1 and M1 segments are within normal limits is well. The MCA bifurcations are intact. There is mild attenuation of distal MCA branch vessels, worse on the left. A moderate right A2 segment stenosis is present distally. The left vertebral artery is small. The left vertebral artery is dominant. Both vertebral arteries are small. The left PICA origin is visualized and normal. The right PICA is not visualized. A moderate stenosis is present in the distal right vertebral artery.  The basilar artery is within normal limits. Both posterior cerebral arteries originate from the basilar tip. There is a moderate stenosis in the distal left P2 segment with asymmetric attenuation of PCA branch vessels on the left. IMPRESSION: 1. No acute intracranial abnormality. 2. Stable atrophy and diffuse white matter disease. 3. Remote lacunar infarcts of the basal ganglia and coronal radiata bilaterally. 4. Remote retinal detachment of the right globe. 5. Moderate stenoses in the distal right A2 segment and distal left P2 segment. 6. Moderate distal small vessel  disease. Electronically Signed By: Lawrence Santiago M.D. On: 07/01/2014 20:41   Dg Chest Portable 1 View  06/30/2014 CLINICAL DATA: Cardiovascular accident. EXAM: PORTABLE CHEST - 1 VIEW COMPARISON: 03/04/2013 FINDINGS: Normal heart size and mediastinal contours for technique. No acute infiltrate or edema. No effusion or pneumothorax. No acute osseous findings. IMPRESSION: No active disease. Electronically Signed By: Jorje Guild M.D. On: 06/30/2014 12:20   Assessment/Plan 1. Diabetic ketoacidosis without coma associated with type 2 diabetes mellitus -resolved, is now on lantus with novolog coverage -has received an educational packet on his diabetes -also had UTI that may have precipitated along with his poor intake  2. Diabetes mellitus, new onset -cont lantus 16 units with 4 units novolog meal coverage -nursing will continue to educate him on this -I discussed hypoglycemia mgt with him today -will need to be able to check his own glucose and administer his insulin, dispose of lancets, test strips and needles/pens properly -is on asa and statin  -needs urine microalbumin  3. Stroke -in the past, affected left arm, but now seems of good strength on my eval  4. Essential hypertension -cont coreg, cardura (BPH), is not yet on an ace or arb and this will need to be considered -will need urine microalbumin obtained to assess for  this  5. Cardiomyopathy -likely from cocaine use in past -cont coreg  6. Urinary tract infection without hematuria, site unspecified -completed cipro 9/7  7. Thrombocytopenia -cont to monitor  8. Hyperlipidemia -cont zocor  9. H/O urinary retention -cont cardura for BPH, has resolved, was in context of infection, monitor urine output  Functional status:  independent  Family/ staff Communication: seen with DNS  Labs/tests ordered:  Bmp in 1 week;  Need to f/u final blood culture report (prelim negative)

## 2014-07-15 ENCOUNTER — Other Ambulatory Visit: Payer: Self-pay | Admitting: Internal Medicine

## 2014-07-15 LAB — MICROALBUMIN, URINE: Microalb, Ur: 0.97 mg/dL (ref 0.00–1.89)

## 2014-07-21 ENCOUNTER — Non-Acute Institutional Stay (SKILLED_NURSING_FACILITY): Payer: Medicare Other | Admitting: Internal Medicine

## 2014-07-21 DIAGNOSIS — E119 Type 2 diabetes mellitus without complications: Secondary | ICD-10-CM

## 2014-07-21 DIAGNOSIS — I10 Essential (primary) hypertension: Secondary | ICD-10-CM

## 2014-07-21 DIAGNOSIS — E785 Hyperlipidemia, unspecified: Secondary | ICD-10-CM

## 2014-07-21 MED ORDER — LISINOPRIL 2.5 MG PO TABS: 2.5000 mg | ORAL_TABLET | Freq: Every day | ORAL | Status: DC

## 2014-07-21 NOTE — Progress Notes (Signed)
Patient ID: Brad Harrell, male   DOB: 22-Feb-1946, 68 y.o.   MRN: [id number]  [facility]  Provider:  Laurelai Lepp L. [name], D.O., C.M.D.  Code Status:  Full code  Chief Complaint  Patient presents with  . Acute Visit    elevated glucose readings    HPI:  68 yo male admitted here for rehab after hospitalization for DKA with new onset DMII; h/o prior stroke, htn, cardiomyopathy, drug use.  His glucose readings have been elevated and adjustments are needed.  His urine microalbumin was normal at 0.97.  His bp is also elevated.  Review of Systems:  Review of Systems  Constitutional: Negative for fever and malaise/fatigue.  HENT: Negative for congestion.   Eyes: Positive for blurred vision.  Respiratory: Negative for shortness of breath.   Cardiovascular: Negative for chest pain.  Gastrointestinal: Negative for constipation.  Genitourinary: Negative for dysuria.  Musculoskeletal: Negative for falls.  Skin: Negative for rash.  Neurological: Positive for weakness. Negative for dizziness.  Endo/Heme/Allergies:       New diabetes  Psychiatric/Behavioral: Negative for memory loss.    Medications: Patient's Medications  New Prescriptions   No medications on file  Previous Medications   ASPIRIN EC 81 MG TABLET    Take 81 mg by mouth daily.   ATORVASTATIN (LIPITOR) 20 MG TABLET    Take 20 mg by mouth daily at 6 PM.   CARVEDILOL (COREG) 3.125 MG TABLET    Take 2 tablets (6.25 mg total) by mouth 2 (two) times daily with a meal.   CLOPIDOGREL (PLAVIX) 75 MG TABLET    Take 75 mg by mouth daily.   DOXAZOSIN (CARDURA) 4 MG TABLET    Take 8 mg by mouth at bedtime.    INSULIN GLARGINE (LANTUS) 100 UNIT/ML INJECTION    Inject 0.16 mLs (16 Units total) into the skin at bedtime.   LEVOTHYROXINE (SYNTHROID, LEVOTHROID) 100 MCG TABLET    Take 100 mcg by mouth daily.   POLYETHYLENE GLYCOL (MIRALAX / GLYCOLAX) PACKET    Take 17 g by mouth 2 (two) times daily.   SENNA (SENOKOT)  8.6 MG TABS TABLET    Take 1 tablet (8.6 mg total) by mouth daily.  Modified Medications   Modified Medication Previous Medication   INSULIN ASPART (NOVOLOG) 100 UNIT/ML INJECTION insulin aspart (NOVOLOG) 100 UNIT/ML injection      Inject 5 Units into the skin 3 (three) times daily with meals. For CBG>150    Inject 4 Units into the skin 3 (three) times daily with meals.  Discontinued Medications   SIMVASTATIN (ZOCOR) 40 MG TABLET    Take 40 mg by mouth every evening.    Physical Exam: Filed Vitals:   07/21/14 1258  BP: 135/80  Pulse: 80  Temp: 97.7 F (36.5 C)  Resp: 16  Height: 5\' 5"  (1.651 m)  Weight: 196 lb (88.905 kg)  SpO2: 97%  Physical Exam  Constitutional: He is oriented to person, place, and time.  Cardiovascular: Normal rate, regular rhythm, normal heart sounds and intact distal pulses.   Pulmonary/Chest: Effort normal and breath sounds normal.  Abdominal: Soft. Bowel sounds are normal.  Musculoskeletal: Normal range of motion.  Neurological: He is alert and oriented to person, place, and time.  Skin: Skin is warm and dry.    Labs reviewed: Basic Metabolic Panel:  Recent Labs  07/01/14 0411 07/02/14 0620 07/03/14 1032  NA 145 141 140  K 4.0 3.6* 3.4*  CL 111 107 106  CO2 21 21 24   GLUCOSE 113* 186* 212*  BUN 15 13 11   CREATININE 1.01 0.89 0.84  CALCIUM 9.0 8.2* 8.6    Liver Function Tests:  Recent Labs  06/30/14 1220  AST 12  ALT 10  ALKPHOS 71  BILITOT 1.6*  PROT 8.0  ALBUMIN 3.2*    CBC:  Recent Labs  06/30/14 1647 07/01/14 0130 07/02/14 0620  WBC 14.5* 12.5* 6.6  HGB 12.3* 12.1* 10.5*  HCT 36.9* 35.8* 31.9*  MCV 87.2 85.0 85.8  PLT 141* 147* 133*   Assessment/Plan 1. Diabetes mellitus, new onset -does not have CKD, but started on ace due to elevated bp anyway and for protective purposes -discussed need to see ophtho and podiatry -need to get lipids, bp, and glucose to goal to prevent another stroke--pt counseled  2.  Essential hypertension -bp elevated so start lisinopril 2.5mg  daily and recheck bmp in 1 wk  3. Hyperlipidemia -cont lipitor and monitor flp about q 3 mos until at goal  Family/ staff Communication: seen with unit supervisor  Goals of care: short term rehab and return home--will need diabetic teaching here and through home health  Labs/tests ordered:  Bmp in 1 wk

## 2014-07-28 ENCOUNTER — Non-Acute Institutional Stay (SKILLED_NURSING_FACILITY): Payer: Medicare Other | Admitting: Internal Medicine

## 2014-07-28 ENCOUNTER — Encounter: Payer: Self-pay | Admitting: Internal Medicine

## 2014-07-28 DIAGNOSIS — Z87898 Personal history of other specified conditions: Secondary | ICD-10-CM

## 2014-07-28 DIAGNOSIS — I6350 Cerebral infarction due to unspecified occlusion or stenosis of unspecified cerebral artery: Secondary | ICD-10-CM

## 2014-07-28 DIAGNOSIS — I639 Cerebral infarction, unspecified: Secondary | ICD-10-CM

## 2014-07-28 DIAGNOSIS — I428 Other cardiomyopathies: Secondary | ICD-10-CM | POA: Diagnosis not present

## 2014-07-28 DIAGNOSIS — E119 Type 2 diabetes mellitus without complications: Secondary | ICD-10-CM | POA: Diagnosis not present

## 2014-07-28 DIAGNOSIS — N390 Urinary tract infection, site not specified: Secondary | ICD-10-CM | POA: Diagnosis not present

## 2014-07-28 DIAGNOSIS — Z87448 Personal history of other diseases of urinary system: Secondary | ICD-10-CM | POA: Diagnosis not present

## 2014-07-28 DIAGNOSIS — I10 Essential (primary) hypertension: Secondary | ICD-10-CM

## 2014-07-28 DIAGNOSIS — E785 Hyperlipidemia, unspecified: Secondary | ICD-10-CM | POA: Diagnosis not present

## 2014-07-28 DIAGNOSIS — I429 Cardiomyopathy, unspecified: Secondary | ICD-10-CM

## 2014-07-28 DIAGNOSIS — D696 Thrombocytopenia, unspecified: Secondary | ICD-10-CM | POA: Diagnosis not present

## 2014-07-28 NOTE — Progress Notes (Signed)
Patient ID: Brad Harrell, male   DOB: January 31, 1946, 68 y.o.   MRN: [id number]  Location:  [facility] [name], D.O., C.M.D.  PCP: [name], MD  Code Status: full code  No Known Allergies  Chief Complaint  Patient presents with  . Discharge Note    discharge home with     HPI:  68 yo black male here for short term rehab s/p hospitalization with DKA and new onset diabetes.  He's had a h/o polysubstance abuse prior to his stroke in 03/04/13 with mild left-sided weakness.  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  We made adjustments to his insulin therapy while here to obtain better control.  Will require close f/u with his pcp and monitoring of hba1c.  We are sending him home with diabetes educational materials and a home health nurse to assist with his diabetes education, as well, and to monitor for hypoglycemia.    Review of Systems:  Review of Systems  Constitutional: Negative for fever.  HENT: Negative for hearing loss.   Eyes: Positive for blurred vision.  Respiratory: Negative for shortness of breath.   Cardiovascular: Negative for chest pain.  Gastrointestinal: Negative for abdominal pain and constipation.  Genitourinary: Negative for dysuria.  Musculoskeletal: Negative for falls.  Neurological: Positive for focal weakness. Negative for dizziness.       Mild left sided not detectable on my exam  Endo/Heme/Allergies:       Diabetes  Psychiatric/Behavioral: Negative for memory loss.    Past Medical History  Diagnosis Date  . Hypertension   . Urinary retention   . Hyperlipidemia   . Cardiomyopathy   . H/O cocaine abuse   . Hypothyroidism   . Stroke     "weaker on my left side since" (03/04/2013)  . Liver abscess     measuring 7 x 9 cm/notes 03/04/2013  . Anemia     Iron Defficiency  . Borderline diabetes     Past Surgical History  Procedure Laterality Date  . Appendectomy    . Inguinal hernia repair Right   . Tibia fracture surgery Left     . Colonoscopy N/A 03/11/2013    Procedure: COLONOSCOPY;  Surgeon: Beryle Beams, MD;  Location: [facility] ENDOSCOPY;  Service: Endoscopy;  Laterality: N/A;  . Esophagogastroduodenoscopy N/A 03/11/2013    Procedure: ESOPHAGOGASTRODUODENOSCOPY (EGD);  Surgeon: Beryle Beams, MD;  Location: [facility] ENDOSCOPY;  Service: Endoscopy;  Laterality: N/A;  . Givens capsule study N/A 03/12/2013    Procedure: GIVENS CAPSULE STUDY;  Surgeon: Beryle Beams, MD;  Location: [place];  Service: Endoscopy;  Laterality: N/A;    Social History:   reports that he has quit smoking. His smoking use included Cigarettes. He has a 51 pack-year smoking history. He has never used smokeless tobacco. He reports that he uses illicit drugs (Cocaine). He reports that he does not drink alcohol.  No family history on file.  Medications: Patient's Medications  New Prescriptions   No medications on file  Previous Medications   ASPIRIN EC 81 MG TABLET    Take 81 mg by mouth daily.   ATORVASTATIN (LIPITOR) 20 MG TABLET    Take 20 mg by mouth daily at 6 PM.   CARVEDILOL (COREG) 3.125 MG TABLET    Take 2 tablets (6.25 mg total) by mouth 2 (two) times daily with a meal.   CLOPIDOGREL (PLAVIX) 75 MG TABLET    Take 75 mg by mouth daily.   DOXAZOSIN (  CARDURA) 4 MG TABLET    Take 8 mg by mouth at bedtime.    INSULIN ASPART (NOVOLOG) 100 UNIT/ML INJECTION    Inject 5 Units into the skin 3 (three) times daily with meals. For CBG>150   INSULIN GLARGINE (LANTUS) 100 UNIT/ML INJECTION    Inject 0.16 mLs (16 Units total) into the skin at bedtime.   LEVOTHYROXINE (SYNTHROID, LEVOTHROID) 100 MCG TABLET    Take 100 mcg by mouth daily.   LISINOPRIL (PRINIVIL,ZESTRIL) 2.5 MG TABLET    Take 1 tablet (2.5 mg total) by mouth daily.   POLYETHYLENE GLYCOL (MIRALAX / GLYCOLAX) PACKET    Take 17 g by mouth 2 (two) times daily.   SENNA (SENOKOT) 8.6 MG TABS TABLET    Take 1 tablet (8.6 mg total) by mouth daily.  Modified Medications   No medications on file   Discontinued Medications   No medications on file    Physical Exam: Filed Vitals:   07/28/14 1136  BP: 112/80  Pulse: 74  Temp: 96.9 F (36.1 C)  Resp: 18  Height: 5\' 5"  (1.651 m)  Weight: 194 lb (87.998 kg)  SpO2: 97%  Physical Exam  Constitutional: He is oriented to person, place, and time. He appears well-developed and well-nourished.  Cardiovascular: Normal rate, regular rhythm, normal heart sounds and intact distal pulses.   Pulmonary/Chest: Effort normal and breath sounds normal.  Musculoskeletal: Normal range of motion.  Neurological: He is alert and oriented to person, place, and time.  Skin: Skin is warm and dry.    Labs reviewed: Basic Metabolic Panel:  Recent Labs  07/01/14 0411 07/02/14 0620 07/03/14 1032  NA 145 141 140  K 4.0 3.6* 3.4*  CL 111 107 106  CO2 21 21 24   GLUCOSE 113* 186* 212*  BUN 15 13 11   CREATININE 1.01 0.89 0.84  CALCIUM 9.0 8.2* 8.6   Liver Function Tests:  Recent Labs  06/30/14 1220  AST 12  ALT 10  ALKPHOS 71  BILITOT 1.6*  PROT 8.0  ALBUMIN 3.2*   No results found for this basename: LIPASE, AMYLASE,  in the last 8760 hours No results found for this basename: AMMONIA,  in the last 8760 hours CBC:  Recent Labs  06/30/14 1647 07/01/14 0130 07/02/14 0620  WBC 14.5* 12.5* 6.6  HGB 12.3* 12.1* 10.5*  HCT 36.9* 35.8* 31.9*  MCV 87.2 85.0 85.8  PLT 141* 147* 133*   Cardiac Enzymes:  Recent Labs  06/30/14 1220  TROPONINI <0.30   BNP: No components found with this basename: POCBNP,  CBG:  Recent Labs  07/02/14 2120 07/03/14 0637 07/03/14 1119  GLUCAP 162* 130* 187*   Procedures and Imaging Studies:  See H&P  Assessment/Plan:   1. Diabetes mellitus, new onset -does not have CKD, but started on ace due to elevated bp anyway and for protective purposes -discussed need to see ophtho and podiatry -need to get lipids, bp, and glucose to goal to prevent another stroke--pt counseled  2. Essential  hypertension -bp elevated so started lisinopril 2.5mg  daily  -cont coreg, cardura (BPH) -f/u bmp  3. Hyperlipidemia -cont lipitor and monitor flp about q 3 mos until at goal  4.  Stroke -in the past, affected left arm, but now seems of good strength on my eval -cont secondary prevention  5. Cardiomyopathy -likely from cocaine use in past -cont coreg  6. Urinary tract infection without hematuria, site unspecified -completed cipro 9/7  7. Thrombocytopenia -cont to monitor  8. H/O urinary  retention -cont cardura for BPH, retention has resolved, was in context of infection, monitor urine output  Patient is being discharged with home health services:  PT, OT, RN for diabetes ed   Patient is being discharged with the following durable medical equipment:  Diabetic supplies  Patient has been advised to f/u with their PCP in 1-2 weeks to bring them up to date on their rehab stay.  They were provided with a 30 day supply of scripts for prescription medications and refills must be obtained from their PCP.    Labs/tests ordered:  F/u bmp, close hba1c and lipid monitoring until he gets to goal and needs outpt ophtho and podiatric monitoring too

## 2014-07-29 ENCOUNTER — Other Ambulatory Visit: Payer: Self-pay | Admitting: Internal Medicine

## 2014-07-29 LAB — BASIC METABOLIC PANEL
BUN: 11 mg/dL (ref 6–23)
CO2: 25 mEq/L (ref 19–32)
Calcium: 9.1 mg/dL (ref 8.4–10.5)
Chloride: 106 mEq/L (ref 96–112)
Creat: 0.99 mg/dL (ref 0.50–1.35)
Glucose, Bld: 131 mg/dL — ABNORMAL HIGH (ref 70–99)
Potassium: 4.3 mEq/L (ref 3.5–5.3)
Sodium: 138 mEq/L (ref 135–145)

## 2014-07-31 ENCOUNTER — Other Ambulatory Visit: Payer: Self-pay | Admitting: Internal Medicine

## 2014-07-31 DIAGNOSIS — E119 Type 2 diabetes mellitus without complications: Secondary | ICD-10-CM

## 2014-07-31 DIAGNOSIS — Z794 Long term (current) use of insulin: Secondary | ICD-10-CM

## 2014-07-31 DIAGNOSIS — I10 Essential (primary) hypertension: Secondary | ICD-10-CM

## 2014-07-31 DIAGNOSIS — I69954 Hemiplegia and hemiparesis following unspecified cerebrovascular disease affecting left non-dominant side: Secondary | ICD-10-CM

## 2014-07-31 MED ORDER — GLUCOSE BLOOD VI STRP: ORAL_STRIP | Status: AC

## 2014-07-31 MED ORDER — NEEDLES & SYRINGES MISC: Status: DC

## 2014-07-31 MED ORDER — FREESTYLE SYSTEM KIT: 1.0000 | PACK | Status: AC | PRN

## 2014-07-31 MED ORDER — LANCETS 28G MISC: Status: AC

## 2014-09-07 ENCOUNTER — Other Ambulatory Visit: Payer: Self-pay | Admitting: Internal Medicine

## 2014-11-22 ENCOUNTER — Encounter: Payer: Self-pay | Admitting: Internal Medicine

## 2014-12-06 ENCOUNTER — Encounter: Payer: Self-pay | Admitting: Internal Medicine

## 2016-08-20 ENCOUNTER — Encounter (HOSPITAL_COMMUNITY): Payer: Self-pay

## 2016-08-20 ENCOUNTER — Observation Stay (HOSPITAL_COMMUNITY)
Admission: EM | Admit: 2016-08-20 | Discharge: 2016-08-21 | Disposition: A | Discharge status: Home / Self Care | Payer: Medicare Other | Attending: Internal Medicine | Admitting: Internal Medicine

## 2016-08-20 DIAGNOSIS — Z7902 Long term (current) use of antithrombotics/antiplatelets: Secondary | ICD-10-CM | POA: Insufficient documentation

## 2016-08-20 DIAGNOSIS — T68XXXA Hypothermia, initial encounter: Secondary | ICD-10-CM | POA: Diagnosis present

## 2016-08-20 DIAGNOSIS — Z87891 Personal history of nicotine dependence: Secondary | ICD-10-CM | POA: Diagnosis not present

## 2016-08-20 DIAGNOSIS — R740 Nonspecific elevation of levels of transaminase and lactic acid dehydrogenase [LDH]: Secondary | ICD-10-CM | POA: Diagnosis not present

## 2016-08-20 DIAGNOSIS — E119 Type 2 diabetes mellitus without complications: Secondary | ICD-10-CM | POA: Insufficient documentation

## 2016-08-20 DIAGNOSIS — E785 Hyperlipidemia, unspecified: Secondary | ICD-10-CM | POA: Diagnosis present

## 2016-08-20 DIAGNOSIS — X31XXXA Exposure to excessive natural cold, initial encounter: Secondary | ICD-10-CM | POA: Diagnosis not present

## 2016-08-20 DIAGNOSIS — M6281 Muscle weakness (generalized): Secondary | ICD-10-CM

## 2016-08-20 DIAGNOSIS — I5042 Chronic combined systolic (congestive) and diastolic (congestive) heart failure: Secondary | ICD-10-CM | POA: Diagnosis not present

## 2016-08-20 DIAGNOSIS — H5461 Unqualified visual loss, right eye, normal vision left eye: Secondary | ICD-10-CM | POA: Diagnosis not present

## 2016-08-20 DIAGNOSIS — Z7982 Long term (current) use of aspirin: Secondary | ICD-10-CM | POA: Diagnosis not present

## 2016-08-20 DIAGNOSIS — E039 Hypothyroidism, unspecified: Secondary | ICD-10-CM | POA: Diagnosis not present

## 2016-08-20 DIAGNOSIS — I69398 Other sequelae of cerebral infarction: Secondary | ICD-10-CM | POA: Diagnosis not present

## 2016-08-20 DIAGNOSIS — Z8673 Personal history of transient ischemic attack (TIA), and cerebral infarction without residual deficits: Secondary | ICD-10-CM

## 2016-08-20 DIAGNOSIS — I429 Cardiomyopathy, unspecified: Secondary | ICD-10-CM | POA: Diagnosis not present

## 2016-08-20 DIAGNOSIS — I5040 Unspecified combined systolic (congestive) and diastolic (congestive) heart failure: Secondary | ICD-10-CM | POA: Diagnosis present

## 2016-08-20 DIAGNOSIS — Z794 Long term (current) use of insulin: Secondary | ICD-10-CM | POA: Diagnosis not present

## 2016-08-20 DIAGNOSIS — I110 Hypertensive heart disease with heart failure: Secondary | ICD-10-CM | POA: Insufficient documentation

## 2016-08-20 DIAGNOSIS — F1411 Cocaine abuse, in remission: Secondary | ICD-10-CM

## 2016-08-20 DIAGNOSIS — R339 Retention of urine, unspecified: Secondary | ICD-10-CM | POA: Diagnosis not present

## 2016-08-20 DIAGNOSIS — I10 Essential (primary) hypertension: Secondary | ICD-10-CM | POA: Diagnosis present

## 2016-08-20 DIAGNOSIS — Z79899 Other long term (current) drug therapy: Secondary | ICD-10-CM | POA: Insufficient documentation

## 2016-08-20 DIAGNOSIS — E872 Acidosis, unspecified: Secondary | ICD-10-CM | POA: Diagnosis present

## 2016-08-20 DIAGNOSIS — R7989 Other specified abnormal findings of blood chemistry: Secondary | ICD-10-CM | POA: Diagnosis present

## 2016-08-20 HISTORY — DX: Unspecified combined systolic (congestive) and diastolic (congestive) heart failure: I50.40

## 2016-08-20 LAB — CBC WITH DIFFERENTIAL/PLATELET
Basophils Absolute: 0 10*3/uL (ref 0.0–0.1)
Basophils Relative: 0 %
EOS ABS: 0 10*3/uL (ref 0.0–0.7)
Eosinophils Relative: 0 %
HEMATOCRIT: 42.7 % (ref 39.0–52.0)
HEMOGLOBIN: 14.5 g/dL (ref 13.0–17.0)
LYMPHS ABS: 1.6 10*3/uL (ref 0.7–4.0)
Lymphocytes Relative: 16 %
MCH: 29.2 pg (ref 26.0–34.0)
MCHC: 34 g/dL (ref 30.0–36.0)
MCV: 85.9 fL (ref 78.0–100.0)
MONOS PCT: 5 %
Monocytes Absolute: 0.5 10*3/uL (ref 0.1–1.0)
NEUTROS ABS: 7.8 10*3/uL — AB (ref 1.7–7.7)
Neutrophils Relative %: 79 %
Platelets: 227 10*3/uL (ref 150–400)
RBC: 4.97 MIL/uL (ref 4.22–5.81)
RDW: 13.3 % (ref 11.5–15.5)
WBC: 9.9 10*3/uL (ref 4.0–10.5)

## 2016-08-20 LAB — URINALYSIS, ROUTINE W REFLEX MICROSCOPIC
BILIRUBIN URINE: NEGATIVE
Glucose, UA: NEGATIVE mg/dL
Ketones, ur: 15 mg/dL — AB
Leukocytes, UA: NEGATIVE
Nitrite: NEGATIVE
PH: 6 (ref 5.0–8.0)
Protein, ur: NEGATIVE mg/dL
SPECIFIC GRAVITY, URINE: 1.019 (ref 1.005–1.030)

## 2016-08-20 LAB — BASIC METABOLIC PANEL
Anion gap: 12 (ref 5–15)
BUN: 15 mg/dL (ref 6–20)
CO2: 20 mmol/L — AB (ref 22–32)
Calcium: 10.2 mg/dL (ref 8.9–10.3)
Chloride: 105 mmol/L (ref 101–111)
Creatinine, Ser: 0.98 mg/dL (ref 0.61–1.24)
GFR calc Af Amer: >60 mL/min (ref >60)
GFR calc non Af Amer: >60 mL/min (ref >60)
Glucose, Bld: 199 mg/dL — ABNORMAL HIGH (ref 65–99)
Potassium: 4.2 mmol/L (ref 3.5–5.1)
Sodium: 137 mmol/L (ref 135–145)

## 2016-08-20 LAB — URINE MICROSCOPIC-ADD ON

## 2016-08-20 LAB — I-STAT CG4 LACTIC ACID, ED: Lactic Acid, Venous: 3.8 mmol/L (ref 0.5–1.9)

## 2016-08-20 NOTE — ED Triage Notes (Signed)
Pt complaining of inability to urinate. Pt states hx of same. Pt with hx of stroke, bilateral weakness. Pt states some incontinence today.

## 2016-08-20 NOTE — ED Notes (Signed)
MD at bedside. 

## 2016-08-20 NOTE — ED Provider Notes (Signed)
[facility] DEPT Provider Note   CSN: [id number] Arrival date & time: 08/20/16  1935     History   Chief Complaint Chief Complaint  Patient presents with  . Urinary Retention    HPI Brad Harrell is a 70 y.o. male with a history of multiple strokes on both sides with no new neurologic deficits and history of prior urinary retention about 1 year prior of unknown etiology presents to the ED again noting a 1 day history of urinary retention and progressively worsening abdominal distention and lower abdominal pain. He describes the urge to urinate, but is simply unable. He had a bowel movement a couple hours PTA which was small volume, and slightly hard to pass. No blood in his stool. He denies any fever, chills, dysuria (before his urinary retention) urinary frequency or urgency, cough, SOB, chest pain, nausea, vomiting, or diarrhea. He denies any history of prior prostate surgery or issues therein.   HPI  Past Medical History:  Diagnosis Date  . Anemia    Iron Defficiency  . Borderline diabetes   . Cardiomyopathy ([place])   . 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 H/O cocaine abuse   . Hyperlipidemia   . Hypertension   . Hypothyroidism   . Liver abscess    measuring 7 x 9 cm/notes 03/04/2013  . Stroke [facility])    "weaker on my left side since" (03/04/2013)  . Urinary retention     Patient Active Problem List   Diagnosis Date Noted  . Hypothermia 08/21/2016  . Metabolic acidosis 85/12/7739  . Elevated lactic acid level 08/21/2016  . Combined systolic and diastolic congestive heart failure ([place])   . Diabetes mellitus, new onset ([place]) 07/01/2014  . Left arm weakness 07/01/2014  . History of CVA  07/01/2014  . Urinary tract infection 07/01/2014  . Thrombocytopenia ([place]) 07/01/2014  . DKA (diabetic ketoacidoses) ([place]) 06/30/2014  . Physical deconditioning 03/13/2013  . Anemia, iron deficiency 03/08/2013  . Bacteremia 03/06/2013  . History of  Liver abscess 03/05/2013  . Encephalopathy, metabolic 28/78/6767  . Anemia 03/05/2013  . Complicated UTI (urinary tract infection) 08/15/2012  . SIRS (systemic inflammatory response syndrome) ([place]) 08/15/2012  . ARF (acute renal failure) ([place]) 08/15/2012  . Hypertension   . Urinary retention   . Stroke ([place])   . Hyperlipidemia   . Cardiomyopathy ([place])   . H/O cocaine abuse     Past Surgical History:  Procedure Laterality Date  . APPENDECTOMY    . COLONOSCOPY N/A 03/11/2013   Procedure: COLONOSCOPY;  Surgeon: Beryle Beams, MD;  Location: [place];  Service: Endoscopy;  Laterality: N/A;  . ESOPHAGOGASTRODUODENOSCOPY N/A 03/11/2013   Procedure: ESOPHAGOGASTRODUODENOSCOPY (EGD);  Surgeon: Beryle Beams, MD;  Location: [facility] ENDOSCOPY;  Service: Endoscopy;  Laterality: N/A;  . GIVENS CAPSULE STUDY N/A 03/12/2013   Procedure: GIVENS CAPSULE STUDY;  Surgeon: Beryle Beams, MD;  Location: [place];  Service: Endoscopy;  Laterality: N/A;  . INGUINAL HERNIA REPAIR Right   . TIBIA FRACTURE SURGERY Left        Home Medications    Prior to Admission medications   Medication Sig Start Date End Date Taking? Authorizing Provider  aspirin EC 81 MG tablet Take 81 mg by mouth daily.   Yes Historical Provider, MD  atorvastatin (LIPITOR) 20 MG tablet TAKE ONE TABLET BY MOUTH DAILY 09/07/14  Yes Tiffany L Reed, DO  carvedilol (COREG) 6.25 MG tablet Take 6.25 mg by mouth 2 (two) times  daily with a meal.   Yes Historical Provider, MD  clopidogrel (PLAVIX) 75 MG tablet Take 75 mg by mouth daily.   Yes Historical Provider, MD  doxazosin (CARDURA) 4 MG tablet Take 8 mg by mouth at bedtime.    Yes Historical Provider, MD  levothyroxine (SYNTHROID, LEVOTHROID) 100 MCG tablet Take 100 mcg by mouth daily.   Yes Historical Provider, MD  metFORMIN (GLUCOPHAGE) 500 MG tablet Take 500 mg by mouth 2 (two) times daily with a meal.   Yes Historical Provider, MD  carvedilol (COREG) 3.125 MG tablet TAKE TWO  TABLETS BY MOUTH TWICE DAILY Patient not taking: Reported on 08/20/2016 09/07/14   Tiffany L Reed, DO  glucose blood (FREESTYLE LITE) test strip Use as instructed 07/31/14   Blanchie Serve, MD  glucose monitoring kit (FREESTYLE) monitoring kit 1 each by Does not apply route as needed for other. For blood sugar check 07/31/14   Blanchie Serve, MD  insulin glargine (LANTUS) 100 UNIT/ML injection Inject 0.16 mLs (16 Units total) into the skin at bedtime. Patient not taking: Reported on 08/20/2016 07/03/14   Elmarie Shiley, MD  Lancets 28G MISC To check blood sugar three times daily with meals and at bedtime 07/31/14   Blanchie Serve, MD  lisinopril (PRINIVIL,ZESTRIL) 2.5 MG tablet Take 1 tablet (2.5 mg total) by mouth daily. Patient not taking: Reported on 08/20/2016 07/21/14   Gayland Curry, DO  Needles & Syringes MISC Needles 31 x 8 and Syringes for new onset diabetes 07/31/14   Blanchie Serve, MD  polyethylene glycol (MIRALAX / GLYCOLAX) packet Take 17 g by mouth 2 (two) times daily. Patient not taking: Reported on 08/20/2016 07/03/14   Belkys A Regalado, MD  senna (SENOKOT) 8.6 MG TABS tablet Take 1 tablet (8.6 mg total) by mouth daily. Patient not taking: Reported on 08/20/2016 07/03/14   Elmarie Shiley, MD    Family History Family History  Problem Relation Age of Onset  . Hypertension Mother   . Hypertension Father   . Diabetes type II Father   . Hypertension Brother     Social History Social History  Substance Use Topics  . Smoking status: Former Smoker    Packs/day: 1.00    Years: 51.00    Types: Cigarettes  . Smokeless tobacco: Never Used     Comment: 03/04/2013 "quit smoking cigarettes over 2 yr ago"  . Alcohol use No     Comment: 03/04/2013 "last alcohol was ~ 2 yr ago too"     Allergies   Review of patient's allergies indicates no known allergies.   Review of Systems Review of Systems  Constitutional: Negative for chills and fever.  Respiratory: Negative for cough and  shortness of breath.   Cardiovascular: Negative for chest pain.  Gastrointestinal: Positive for abdominal distention and abdominal pain. Negative for diarrhea, nausea and vomiting.  Genitourinary: Positive for decreased urine volume and difficulty urinating. Negative for discharge, flank pain, genital sores, hematuria, penile pain, penile swelling, scrotal swelling, testicular pain and urgency.  Musculoskeletal: Negative for back pain, myalgias and neck pain.  Skin: Negative for rash.  Neurological: Positive for facial asymmetry (baseline) and weakness (chronic, at baseline). Negative for dizziness, syncope, speech difficulty and headaches.  All other systems reviewed and are negative.    Physical Exam Updated Vital Signs BP 160/79 (BP Location: Left Arm)   Pulse 98   Temp 99.2 F (37.3 C) (Oral)   Resp 18   Ht 5' 4"  (1.626 m)   Wt  72.6 kg   SpO2 96%   BMI 27.46 kg/m   Physical Exam  Constitutional: He is oriented to person, place, and time. He appears well-developed and well-nourished. No distress.  HENT:  Head: Normocephalic and atraumatic.  Nose: Nose normal.  Mouth/Throat: Oropharynx is clear and moist.  Eyes: Conjunctivae and EOM are normal. Pupils are equal, round, and reactive to light.  Neck: Neck supple.  Cardiovascular: Regular rhythm, normal heart sounds and intact distal pulses.  Tachycardia present.   Pulmonary/Chest: Effort normal and breath sounds normal. He exhibits no tenderness.  Abdominal: Soft. He exhibits distension. There is tenderness. There is no rebound and no guarding. No hernia. Hernia confirmed negative in the right inguinal area and confirmed negative in the left inguinal area.  Genitourinary: Testes normal and penis normal. Right testis shows no tenderness. Left testis shows no tenderness. Uncircumcised. No penile erythema or penile tenderness. No discharge found.  Musculoskeletal: He exhibits no edema or tenderness.  Neurological: He is alert and  oriented to person, place, and time. A cranial nerve deficit (right facial droop, chronic) is present. Coordination normal. GCS eye subscore is 4. GCS verbal subscore is 5. GCS motor subscore is 6.  B/l weakness and mild sensory deficits from prior b/l CVA. At neurologic baseline wityh no new focal deficits.   Skin: Skin is warm and dry. No rash noted. He is not diaphoretic.  Nursing note and vitals reviewed.    ED Treatments / Results  Labs (all labs ordered are listed, but only abnormal results are displayed) Labs Reviewed  MRSA PCR SCREENING - Abnormal; Notable for the following:       Result Value   MRSA by PCR POSITIVE (*)    All other components within normal limits  URINALYSIS, ROUTINE W REFLEX MICROSCOPIC (NOT AT [facility]) - Abnormal; Notable for the following:    Hgb urine dipstick LARGE (*)    Ketones, ur 15 (*)    All other components within normal limits  CBC WITH DIFFERENTIAL/PLATELET - Abnormal; Notable for the following:    Neutro Abs 7.8 (*)    All other components within normal limits  BASIC METABOLIC PANEL - Abnormal; Notable for the following:    CO2 20 (*)    Glucose, Bld 199 (*)    All other components within normal limits  URINE MICROSCOPIC-ADD ON - Abnormal; Notable for the following:    Squamous Epithelial / LPF 0-5 (*)    Bacteria, UA RARE (*)    All other components within normal limits  HEPATIC FUNCTION PANEL - Abnormal; Notable for the following:    Total Protein 8.4 (*)    ALT 14 (*)    Bilirubin, Direct <0.1 (*)    All other components within normal limits  BASIC METABOLIC PANEL - Abnormal; Notable for the following:    Potassium 6.1 (*)    Glucose, Bld 126 (*)    Creatinine, Ser 1.36 (*)    GFR calc non Af Amer 50 (*)    GFR calc Af Amer 58 (*)    All other components within normal limits  LACTIC ACID, PLASMA - Abnormal; Notable for the following:    Lactic Acid, Venous 2.6 (*)    All other components within normal limits  BASIC METABOLIC PANEL  - Abnormal; Notable for the following:    Glucose, Bld 135 (*)    All other components within normal limits  GLUCOSE, CAPILLARY - Abnormal; Notable for the following:    Glucose-Capillary 125 (*)  All other components within normal limits  I-STAT CG4 LACTIC ACID, ED - Abnormal; Notable for the following:    Lactic Acid, Venous 3.80 (*)    All other components within normal limits  CULTURE, BLOOD (ROUTINE X 2)  CULTURE, BLOOD (ROUTINE X 2)  URINE CULTURE  CBC  LACTIC ACID, PLASMA  PROCALCITONIN    EKG  EKG Interpretation None       Radiology Dg Chest Port 1 View  Result Date: 08/21/2016 CLINICAL DATA:  70 year old male with sepsis EXAM: PORTABLE CHEST 1 VIEW COMPARISON:  Chest radiograph dated 06/30/2014 FINDINGS: Minimal bibasilar atelectatic changes. No focal consolidation, pleural effusion, or pneumothorax. The cardiac silhouette is within normal limits. No acute osseous pathology. IMPRESSION: No active disease. Electronically Signed   By: Anner Crete M.D.   On: 08/21/2016 02:24    Procedures Procedures (including critical care time)  Medications Ordered in ED Medications  carvedilol (COREG) tablet 6.25 mg (6.25 mg Oral Given 08/21/16 0839)  atorvastatin (LIPITOR) tablet 20 mg (not administered)  aspirin EC tablet 81 mg (not administered)  clopidogrel (PLAVIX) tablet 75 mg (not administered)  doxazosin (CARDURA) tablet 8 mg (8 mg Oral Not Given 08/21/16 0556)  levothyroxine (SYNTHROID, LEVOTHROID) tablet 100 mcg (100 mcg Oral Given 08/21/16 0839)  0.9 %  sodium chloride infusion ( Intravenous New Bag/Given 08/21/16 0601)  insulin aspart (novoLOG) injection 0-9 Units (1 Units Subcutaneous Given 08/21/16 0839)  insulin aspart (novoLOG) injection 0-5 Units (0 Units Subcutaneous Not Given 08/21/16 0557)  enoxaparin (LOVENOX) injection 40 mg (not administered)  sodium chloride flush (NS) 0.9 % injection 3 mL (3 mLs Intravenous Not Given 08/21/16 0601)  acetaminophen  (TYLENOL) tablet 650 mg (not administered)    Or  acetaminophen (TYLENOL) suppository 650 mg (not administered)  zolpidem (AMBIEN) tablet 5 mg (not administered)  finasteride (PROSCAR) tablet 5 mg (not administered)  sodium chloride 0.9 % bolus 1,000 mL (1,000 mLs Intravenous New Bag/Given 08/21/16 0210)  ondansetron (ZOFRAN-ODT) disintegrating tablet 4 mg (4 mg Oral Given 08/21/16 0210)     Initial Impression / Assessment and Plan / ED Course  I have reviewed the triage vital signs and the nursing notes.  Pertinent labs & imaging results that were available during my care of the patient were reviewed by me and considered in my medical decision making (see chart for details).  Clinical Course  70 y.o. male presents with acute urinary retention. On arrival his vitals were significant for hypothermia, rechecked and was 96.9 rectally. Mild tachy around 100, hypertensive blood pressure.   Exam as above appearing significantly uncomfortable and distended. Bladder scan showed 1L of urine in the bladder. Foley was placed and after draining the patient stated that his sx resolved. No other complaints noted.  Labs were drawn and returned showing lactic acidosis of 3.8, bicarb 20, but otherwise reassuring electrolytes, renal fxn, LFTs, and normal CBC. UA returned showing large blood, likely from cathing, but no evidence of infection. Sent for culture.   CXR was done and showed no acute findings.   He was given an IV fluid bolus and was admitted to observation given hypothermia and lactic acidosis with urinary retention.    Final Clinical Impressions(s) / ED Diagnoses   Final diagnoses:  Urinary retention  Lactic acidosis  Chronic combined systolic and diastolic congestive heart failure [facility])    New Prescriptions Current Discharge Medication List       Zenovia Jarred, DO 08/21/16 1034    Elnora Morrison, MD 08/26/16 [phone]

## 2016-08-20 NOTE — ED Notes (Signed)
Performed bladder scan on pt and found >936mL.

## 2016-08-21 ENCOUNTER — Encounter (HOSPITAL_COMMUNITY): Payer: Self-pay | Admitting: Internal Medicine

## 2016-08-21 ENCOUNTER — Emergency Department (HOSPITAL_COMMUNITY): Payer: Medicare Other

## 2016-08-21 DIAGNOSIS — Z8673 Personal history of transient ischemic attack (TIA), and cerebral infarction without residual deficits: Secondary | ICD-10-CM | POA: Diagnosis not present

## 2016-08-21 DIAGNOSIS — R339 Retention of urine, unspecified: Secondary | ICD-10-CM | POA: Diagnosis not present

## 2016-08-21 DIAGNOSIS — I10 Essential (primary) hypertension: Secondary | ICD-10-CM

## 2016-08-21 DIAGNOSIS — E119 Type 2 diabetes mellitus without complications: Secondary | ICD-10-CM

## 2016-08-21 DIAGNOSIS — E872 Acidosis, unspecified: Secondary | ICD-10-CM | POA: Diagnosis present

## 2016-08-21 DIAGNOSIS — T68XXXA Hypothermia, initial encounter: Secondary | ICD-10-CM

## 2016-08-21 DIAGNOSIS — E785 Hyperlipidemia, unspecified: Secondary | ICD-10-CM | POA: Diagnosis not present

## 2016-08-21 DIAGNOSIS — R7989 Other specified abnormal findings of blood chemistry: Secondary | ICD-10-CM | POA: Diagnosis present

## 2016-08-21 DIAGNOSIS — I5040 Unspecified combined systolic (congestive) and diastolic (congestive) heart failure: Secondary | ICD-10-CM | POA: Diagnosis present

## 2016-08-21 LAB — PROCALCITONIN: Procalcitonin: <0.1 ng/mL

## 2016-08-21 LAB — GLUCOSE, CAPILLARY
Glucose-Capillary: 125 mg/dL — ABNORMAL HIGH (ref 65–99)
Glucose-Capillary: 163 mg/dL — ABNORMAL HIGH (ref 65–99)

## 2016-08-21 LAB — BASIC METABOLIC PANEL
Anion gap: 8 (ref 5–15)
Anion gap: 9 (ref 5–15)
BUN: 18 mg/dL (ref 6–20)
BUN: 18 mg/dL (ref 6–20)
CALCIUM: 9.7 mg/dL (ref 8.9–10.3)
CHLORIDE: 108 mmol/L (ref 101–111)
CO2: 24 mmol/L (ref 22–32)
CO2: 23 mmol/L (ref 22–32)
CREATININE: 1.36 mg/dL — AB (ref 0.61–1.24)
CREATININE: 1.17 mg/dL (ref 0.61–1.24)
Calcium: 9.8 mg/dL (ref 8.9–10.3)
Chloride: 104 mmol/L (ref 101–111)
GFR calc Af Amer: >60 mL/min (ref >60)
GFR calc non Af Amer: 50 mL/min — ABNORMAL LOW (ref >60)
GFR calc non Af Amer: >60 mL/min (ref >60)
GFR, EST AFRICAN AMERICAN: 58 mL/min — AB (ref >60)
GLUCOSE: 135 mg/dL — AB (ref 65–99)
Glucose, Bld: 126 mg/dL — ABNORMAL HIGH (ref 65–99)
Potassium: 6.1 mmol/L — ABNORMAL HIGH (ref 3.5–5.1)
Potassium: 4.8 mmol/L (ref 3.5–5.1)
SODIUM: 136 mmol/L (ref 135–145)
SODIUM: 140 mmol/L (ref 135–145)

## 2016-08-21 LAB — HEPATIC FUNCTION PANEL
ALK PHOS: 49 U/L (ref 38–126)
ALT: 14 U/L — AB (ref 17–63)
AST: 18 U/L (ref 15–41)
Albumin: 4.5 g/dL (ref 3.5–5.0)
BILIRUBIN TOTAL: 0.8 mg/dL (ref 0.3–1.2)
Total Protein: 8.4 g/dL — ABNORMAL HIGH (ref 6.5–8.1)

## 2016-08-21 LAB — CBC
HCT: 41.8 % (ref 39.0–52.0)
Hemoglobin: 13.8 g/dL (ref 13.0–17.0)
MCH: 28.6 pg (ref 26.0–34.0)
MCHC: 33 g/dL (ref 30.0–36.0)
MCV: 86.5 fL (ref 78.0–100.0)
PLATELETS: 191 10*3/uL (ref 150–400)
RBC: 4.83 MIL/uL (ref 4.22–5.81)
RDW: 13.5 % (ref 11.5–15.5)
WBC: 9.1 10*3/uL (ref 4.0–10.5)

## 2016-08-21 LAB — LACTIC ACID, PLASMA: Lactic Acid, Venous: 1.7 mmol/L (ref 0.5–1.9)

## 2016-08-21 LAB — MRSA PCR SCREENING: MRSA BY PCR: POSITIVE — AB

## 2016-08-21 MED ORDER — INSULIN ASPART 100 UNIT/ML ~~LOC~~ SOLN: 0.0000 [IU] | Freq: Every day | SUBCUTANEOUS | Status: DC

## 2016-08-21 MED ORDER — CHLORHEXIDINE GLUCONATE CLOTH 2 % EX PADS: 6.0000 | MEDICATED_PAD | Freq: Every day | CUTANEOUS | Status: DC

## 2016-08-21 MED ORDER — ENOXAPARIN SODIUM 40 MG/0.4ML ~~LOC~~ SOLN
40.0000 mg | SUBCUTANEOUS | Status: DC
  Administered 2016-08-21: 40 mg via SUBCUTANEOUS
  Filled 2016-08-21: qty 0.4

## 2016-08-21 MED ORDER — CARVEDILOL 6.25 MG PO TABS
6.2500 mg | ORAL_TABLET | Freq: Two times a day (BID) | ORAL | Status: DC
  Administered 2016-08-21 (×2): 6.25 mg via ORAL
  Filled 2016-08-21 (×2): qty 1

## 2016-08-21 MED ORDER — FINASTERIDE 5 MG PO TABS: 5.0000 mg | ORAL_TABLET | Freq: Every day | ORAL | 0 refills | Status: DC

## 2016-08-21 MED ORDER — ATORVASTATIN CALCIUM 20 MG PO TABS
20.0000 mg | ORAL_TABLET | Freq: Every day | ORAL | Status: DC
  Administered 2016-08-21: 20 mg via ORAL
  Filled 2016-08-21: qty 1

## 2016-08-21 MED ORDER — ONDANSETRON 4 MG PO TBDP
4.0000 mg | ORAL_TABLET | Freq: Once | ORAL | Status: AC
  Administered 2016-08-21: 4 mg via ORAL
  Filled 2016-08-21: qty 1

## 2016-08-21 MED ORDER — LEVOTHYROXINE SODIUM 100 MCG PO TABS
100.0000 ug | ORAL_TABLET | Freq: Every day | ORAL | Status: DC
  Administered 2016-08-21: 100 ug via ORAL
  Filled 2016-08-21 (×2): qty 1

## 2016-08-21 MED ORDER — SODIUM CHLORIDE 0.9% FLUSH: 3.0000 mL | Freq: Two times a day (BID) | INTRAVENOUS | Status: DC

## 2016-08-21 MED ORDER — ACETAMINOPHEN 650 MG RE SUPP: 650.0000 mg | Freq: Four times a day (QID) | RECTAL | Status: DC | PRN

## 2016-08-21 MED ORDER — ACETAMINOPHEN 325 MG PO TABS: 650.0000 mg | ORAL_TABLET | Freq: Four times a day (QID) | ORAL | Status: DC | PRN

## 2016-08-21 MED ORDER — MUPIROCIN 2 % EX OINT
1.0000 "application " | TOPICAL_OINTMENT | Freq: Two times a day (BID) | CUTANEOUS | Status: DC
  Administered 2016-08-21: 1 via NASAL
  Filled 2016-08-21: qty 22

## 2016-08-21 MED ORDER — FINASTERIDE 5 MG PO TABS
5.0000 mg | ORAL_TABLET | Freq: Every day | ORAL | Status: DC
  Administered 2016-08-21: 5 mg via ORAL
  Filled 2016-08-21: qty 1

## 2016-08-21 MED ORDER — ASPIRIN EC 81 MG PO TBEC
81.0000 mg | DELAYED_RELEASE_TABLET | Freq: Every day | ORAL | Status: DC
Start: 2016-08-21 — End: 2016-08-21
  Administered 2016-08-21: 81 mg via ORAL
  Filled 2016-08-21: qty 1

## 2016-08-21 MED ORDER — INSULIN ASPART 100 UNIT/ML ~~LOC~~ SOLN
0.0000 [IU] | Freq: Three times a day (TID) | SUBCUTANEOUS | Status: DC
  Administered 2016-08-21: 1 [IU] via SUBCUTANEOUS
  Administered 2016-08-21: 2 [IU] via SUBCUTANEOUS

## 2016-08-21 MED ORDER — SODIUM POLYSTYRENE SULFONATE 15 GM/60ML PO SUSP
30.0000 g | Freq: Once | ORAL | Status: DC
  Filled 2016-08-21: qty 120

## 2016-08-21 MED ORDER — SODIUM CHLORIDE 0.9 % IV SOLN
INTRAVENOUS | Status: DC
  Administered 2016-08-21: 06:00:00 via INTRAVENOUS

## 2016-08-21 MED ORDER — SODIUM CHLORIDE 0.9 % IV BOLUS (SEPSIS)
1000.0000 mL | Freq: Once | INTRAVENOUS | Status: AC
  Administered 2016-08-21: 1000 mL via INTRAVENOUS

## 2016-08-21 MED ORDER — ZOLPIDEM TARTRATE 5 MG PO TABS: 5.0000 mg | ORAL_TABLET | Freq: Every evening | ORAL | Status: DC | PRN

## 2016-08-21 MED ORDER — DOXAZOSIN MESYLATE 8 MG PO TABS
8.0000 mg | ORAL_TABLET | Freq: Every day | ORAL | Status: DC
  Filled 2016-08-21 (×2): qty 1

## 2016-08-21 MED ORDER — CLOPIDOGREL BISULFATE 75 MG PO TABS
75.0000 mg | ORAL_TABLET | Freq: Every day | ORAL | Status: DC
  Administered 2016-08-21: 75 mg via ORAL
  Filled 2016-08-21: qty 1

## 2016-08-21 NOTE — ED Notes (Signed)
IV in left arm found to be infiltrated.  Removed at this time.. Dr Dina Rich attempting another [place] IV.

## 2016-08-21 NOTE — Progress Notes (Signed)
Attempted to evaluate pt today but potassium is 6.1 which is outside range to do therapy.  Will check back as schedule allows. Jinger Neighbors, [place] [phone]

## 2016-08-21 NOTE — ED Notes (Signed)
Admitting Hospitalist made aware of 6.1 k+

## 2016-08-21 NOTE — ED Notes (Signed)
IV team RN unable to gain IV access. MD made aware and to look for IV via ultrasound.

## 2016-08-21 NOTE — Discharge Summary (Signed)
Physician Discharge Summary  Brad Harrell [id number] DOB: April 13, 1946 DOA: 08/20/2016  PCP: [name], MD  Admit date: 08/20/2016 Discharge date: 08/21/2016   Recommendations for Outpatient Follow-Up:   1. Follow up with urology for foley 2. Home health RN   Discharge Diagnosis:   Principal Problem:   Urinary retention Active Problems:   Hyperlipidemia   H/O cocaine abuse   Hypertension   History of CVA    Hypothermia   Metabolic acidosis   Elevated lactic acid level   Combined systolic and diastolic congestive heart failure [facility])   Discharge disposition:  Home  Discharge Condition: Improved.  Diet recommendation: Low sodium, heart healthy.  Carbohydrate-modified  Wound care: None.   History of Present Illness:   Brad Harrell is a 70 y.o. 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  Pt states that he has difficulty urinating, which has been progressively getting worse today. He also has abdominal distention, and mild tenderness over suprapubic area, which has now resolved after Foley cath is placed and 999 cc of urine removed. Patient states that he has dribbling and difficulty urinating which has been going on for a long time. He denies burning or dysuria. Patient does not have nausea, vomiting, diarrhea. No chest pain, cough, fever or chills. He has chronic mild shortness of breath, which has not changed. 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 No new unilateral weakness, vision change or hearing loss.   Hospital Course by Problem:   Urinary retention: Etiology is not clear -BPH? -is taking doxazosin. -add  Proscar -referral to urologist for foley cath  -home health RN    Medical Consultants:    None.   Discharge Exam:   Vitals:   08/21/16 1200 08/21/16 1229  BP: 127/75   Pulse: 90 98  Resp:    Temp: 99 F (37.2 C)    Vitals:   08/21/16 0536 08/21/16 0847 08/21/16 1200 08/21/16 1229  BP: 160/79  127/75   Pulse: 109 98 90 98  Resp: 18 18    Temp: 99 F (37.2 C) 99.2 F (37.3 C) 99 F (37.2 C)   TempSrc: Oral Oral Oral   SpO2: 96%  98%   Weight:      Height:        Gen:  NAD    The results of significant diagnostics from this hospitalization (including imaging, microbiology, ancillary and laboratory) are listed below for reference.     Procedures and Diagnostic Studies:   Dg Chest Port 1 View  Result Date: 08/21/2016 CLINICAL DATA:  70 year old male with sepsis EXAM: PORTABLE CHEST 1 VIEW COMPARISON:  Chest radiograph dated 06/30/2014 FINDINGS: Minimal bibasilar atelectatic changes. No focal consolidation, pleural effusion, or pneumothorax. The cardiac silhouette is within normal limits. No acute osseous pathology. IMPRESSION: No active disease. Electronically Signed   By: Anner Crete M.D.   On: 08/21/2016 02:24     Labs:   Basic Metabolic Panel:  Recent Labs Lab 08/20/16 2230 08/21/16 0200 08/21/16 0510  NA 137 136 140  K 4.2 6.1* 4.8  CL 105 104 108  CO2 20* 24 23  GLUCOSE 199* 126* 135*  BUN _0 CREATININE 0.98 1.36* 1.17  CALCIUM 10.2 9.7 9.8  GFR Estimated Creatinine Clearance: 52.1 mL/min (by C-G formula based on SCr of 1.17 mg/dL). Liver Function Tests:  Recent Labs Lab 08/20/16 2230  AST 18  ALT 14*  ALKPHOS 49  BILITOT 0.8  PROT 8.4*  ALBUMIN 4.5   No results for input(s): LIPASE, AMYLASE in the last 168 hours. No results for input(s): AMMONIA in the last 168 hours. Coagulation profile No results for input(s): INR, PROTIME in the last 168 hours.  CBC:  Recent Labs Lab 08/20/16 2230 08/21/16 0200  WBC 9.9  9.1  NEUTROABS 7.8*  --   HGB 14.5 13.8  HCT 42.7 41.8  MCV 85.9 86.5  PLT 227 191   Cardiac Enzymes: No results for input(s): CKTOTAL, CKMB, CKMBINDEX, TROPONINI in the last 168 hours. BNP: Invalid input(s): POCBNP CBG:  Recent Labs Lab 08/21/16 0811 08/21/16 1213  GLUCAP 125* 163*   D-Dimer No results for input(s): DDIMER in the last 72 hours. Hgb A1c No results for input(s): HGBA1C in the last 72 hours. Lipid Profile No results for input(s): CHOL, HDL, LDLCALC, TRIG, CHOLHDL, LDLDIRECT in the last 72 hours. Thyroid function studies No results for input(s): TSH, T4TOTAL, T3FREE, THYROIDAB in the last 72 hours.  Invalid input(s): FREET3 Anemia work up No results for input(s): VITAMINB12, FOLATE, FERRITIN, TIBC, IRON, RETICCTPCT in the last 72 hours. Microbiology Recent Results (from the past 240 hour(s))  MRSA PCR Screening     Status: Abnormal   Collection Time: 08/21/16  6:06 AM  Result Value Ref Range Status   MRSA by PCR POSITIVE (A) NEGATIVE Final    Comment:        The GeneXpert MRSA Assay (FDA approved for NASAL specimens only), is one component of a comprehensive MRSA colonization surveillance program. It is not intended to diagnose MRSA infection nor to guide or monitor treatment for MRSA infections. RESULT CALLED TO, READ BACK BY AND VERIFIED WITH: D. JOHNSON 0929 [id number] N. MORRIS       Discharge Instructions:   Discharge Instructions    Diet - low sodium heart healthy    Complete by:  As directed    Diet Carb Modified    Complete by:  As directed    Discharge instructions    Complete by:  As directed    Will follow up with urology as outpatient  Home health RN for foley care   Increase activity slowly    Complete by:  As directed        Medication List    STOP taking these medications   insulin glargine 100 UNIT/ML injection Commonly known as:  LANTUS   lisinopril 2.5 MG tablet Commonly known as:  PRINIVIL,ZESTRIL   Needles &  Syringes Misc   polyethylene glycol packet Commonly known as:  MIRALAX / GLYCOLAX   senna 8.6 MG Tabs tablet Commonly known as:  SENOKOT     TAKE these medications   acetaminophen 325 MG tablet Commonly known as:  TYLENOL Take 2 tablets (650 mg total) by mouth every 6 (six) hours as needed for mild pain (or Fever >/= 101).   aspirin EC 81 MG tablet Take 81 mg by mouth daily.   atorvastatin 20 MG tablet Commonly known as:  LIPITOR TAKE ONE TABLET BY MOUTH DAILY   carvedilol 6.25 MG tablet Commonly known as:  COREG Take 6.25 mg by mouth 2 (two) times daily with a meal. What changed:  Another medication with the same name was removed. Continue taking this medication, and follow the directions  you see here.   clopidogrel 75 MG tablet Commonly known as:  PLAVIX Take 75 mg by mouth daily.   doxazosin 4 MG tablet Commonly known as:  CARDURA Take 8 mg by mouth at bedtime.   finasteride 5 MG tablet Commonly known as:  PROSCAR Take 1 tablet (5 mg total) by mouth daily. Start taking on:  08/22/2016   glucose blood test strip Commonly known as:  FREESTYLE LITE Use as instructed   glucose monitoring kit monitoring kit 1 each by Does not apply route as needed for other. For blood sugar check   Lancets 28G Misc To check blood sugar three times daily with meals and at bedtime   levothyroxine 100 MCG tablet Commonly known as:  SYNTHROID, LEVOTHROID Take 100 mcg by mouth daily.   metFORMIN 500 MG tablet Commonly known as:  GLUCOPHAGE Take 500 mg by mouth 2 (two) times daily with a meal.      Follow-up Information    [facility] for urinary retention and catheter palcement follow up .            Time coordinating discharge: 35 min  Signed:  Dorella Laster U Keddrick Wyne   [facility] 08/21/2016, 2:01 PM

## 2016-08-21 NOTE — Progress Notes (Signed)
PT Cancellation Note  Patient Details Name: Brad Harrell MRN: [id number] DOB: [birth year]   Cancelled Treatment:    Reason Eval/Treat Not Completed: Medical issues which prohibited therapy.  Had a critical lab today and will check later as time and pt allow.   Ramond Dial 08/21/2016, 8:04 AM   Mee Hives, PT MS Acute Rehab Dept. Number: [place]

## 2016-08-21 NOTE — ED Notes (Signed)
MD attempted ultrasound IV x 3, without success.

## 2016-08-21 NOTE — Progress Notes (Signed)
Pt admitted to 5w30 from ED. Pt A&Ox4. Pt's rt eye is closed, pt states blind in that eye. Pt lives at home with daughter. Pt has foley placed in ED. Pt's left upper arm is very swollen from infiltrated IV in ED. Pt's skin is warm dry and intact. Pt's toenails are very long and discolored. Oriented pt to room. Told to call for assistance before getting up. Pt stated understanding. Will continue to monitor pt. Ranelle Oyster, RN

## 2016-08-21 NOTE — ED Notes (Signed)
Made report to the floor.

## 2016-08-21 NOTE — Progress Notes (Signed)
IV removed, and discharge instructions given to pts. Daughter, instructed pts. Daughter on foley care, and drainage, daughter aware of follow up appointment with urologist this week, skin intact. Pt. transported home by daughter Luci Bank, RN

## 2016-08-21 NOTE — Evaluation (Signed)
Physical Therapy Evaluation Patient Details Name: Brad Harrell MRN: [id number] DOB: 1946-08-18 Today's Date: 08/21/2016   History of Present Illness  Brad Harrell is a 70 y.o. 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  Clinical Impression  Patient presents with decreased independence with mobility due to deficits listed in PT problem list.  He will benefit from skilled PT in the acute setting to allow return home with family support and follow up HHPT.     Follow Up Recommendations Home health PT    Equipment Recommendations  None recommended by PT    Recommendations for Other Services       Precautions / Restrictions Precautions Precautions: Fall      Mobility  Bed Mobility Overal bed mobility: Needs Assistance Bed Mobility: Supine to Sit;Sit to Supine     Supine to sit: Supervision Sit to supine: Supervision   General bed mobility comments: cues for positioning for sit to supine; heavy rail use for supine to sit with increased time  Transfers Overall transfer level: Needs assistance Equipment used: Rolling walker (2 wheeled) Transfers: Sit to/from Stand Sit to Stand: Supervision;Min assist         General transfer comment: initially min A for anterior weight shift, cues for hand placement, then stood with S later in session with both hands on walker  Ambulation/Gait Ambulation/Gait assistance: Min guard Ambulation Distance (Feet): 150 Feet Assistive device: Rolling walker (2 wheeled) Gait Pattern/deviations: Step-to pattern;Step-through pattern;Trunk flexed;Shuffle;Decreased stride length;Decreased step length - left;Wide base of support     General Gait  Details: heavy reliance on walker, L LE with genuvalgus deformity and functional weakness with knee buckling slightly, tends to get walker ahead and increased flexion in trunk,  minguard mostly for safety,   Stairs            Wheelchair Mobility    Modified Rankin (Stroke Patients Only)       Balance Overall balance assessment: Needs assistance Sitting-balance support: Feet supported Sitting balance-Leahy Scale: Good     Standing balance support: Bilateral upper extremity supported Standing balance-Leahy Scale: Poor Standing balance comment: needs UE support to stand safely                             Pertinent Vitals/Pain Pain Assessment: No/denies pain    Home Living Family/patient expects to be discharged to:: Private residence Living Arrangements: Children Available Help at Discharge: Family;Available PRN/intermittently Type of Home: House Home Access: Level entry     Home Layout: One level Home Equipment: Shower seat Additional Comments: lives with daughter who works during the day    Prior Function Level of Independence: Needs assistance   Gait / Transfers Assistance Needed: uses cane or walker at home  ADL's / Homemaking Assistance Needed: daughter helps with meals, pt showers independent with shower seat and hand held shower, no grabbar        Hand Dominance   Dominant Hand: Right    Extremity/Trunk Assessment   Upper Extremity Assessment: Overall WFL for tasks assessed (L weaker with elbow extension)           Lower Extremity Assessment: LLE deficits/detail   LLE Deficits / Details: tested WFL, but functional weakness with ambulation  Cervical /  Trunk Assessment: Kyphotic  Communication   Communication: No difficulties  Cognition Arousal/Alertness: Awake/alert Behavior During Therapy: Flat affect Overall Cognitive Status: Within Functional Limits for tasks assessed                      General Comments General  comments (skin integrity, edema, etc.): demonstrated and pt performed stepping over trashcan to simulate tub transfer, min A for safety; discussed safety measures, not walking without walker, having assist for shower transfer, footwear, lighting, clear path, etc.    Exercises     Assessment/Plan    PT Assessment Patient needs continued PT services  PT Problem List Decreased strength;Decreased balance;Decreased activity tolerance;Decreased knowledge of use of DME;Decreased mobility;Decreased safety awareness          PT Treatment Interventions DME instruction;Gait training;Therapeutic exercise;Therapeutic activities;Balance training;Patient/family education;Functional mobility training    PT Goals (Current goals can be found in the Care Plan section)  Acute Rehab PT Goals Patient Stated Goal: To go home PT Goal Formulation: With patient Time For Goal Achievement: 08/25/16 Potential to Achieve Goals: Good    Frequency Min 3X/week   Barriers to discharge Decreased caregiver support alone during the day    Co-evaluation               End of Session Equipment Utilized During Treatment: Gait belt Activity Tolerance: Patient tolerated treatment well Patient left: in bed;with call bell/phone within reach;with bed alarm set      Functional Assessment Tool Used: Clinical Judgement Functional Limitation: Mobility: Walking and moving around Mobility: Walking and Moving Around Current Status [id number]): At least 20 percent but less than 40 percent impaired, limited or restricted Mobility: Walking and Moving Around Goal Status [phone]): At least 1 percent but less than 20 percent impaired, limited or restricted    Time: 1540-1604 PT Time Calculation (min) (ACUTE ONLY): 24 min   Charges:   PT Evaluation $PT Eval Moderate Complexity: 1 Procedure PT Treatments $Gait Training: 8-22 mins   PT G Codes:   PT G-Codes **NOT FOR INPATIENT CLASS** Functional Assessment Tool Used:  Clinical Judgement Functional Limitation: Mobility: Walking and moving around Mobility: Walking and Moving Around Current Status [id number]): At least 20 percent but less than 40 percent impaired, limited or restricted Mobility: Walking and Moving Around Goal Status [phone]): At least 1 percent but less than 20 percent impaired, limited or restricted    Reginia Naas 08/21/2016, 4:24 PM  Magda Kiel, [place] 08/21/2016

## 2016-08-21 NOTE — H&P (Signed)
History and Physical    Brad Harrell AJO:[id number] DOB: 1946-02-07 DOA: 08/20/2016  Referring MD/NP/PA:   PCP: Brad Fendt, MD   Patient coming from:  The patient is coming from home.  At baseline, pt is partially dependent for his ADL.  Chief Complaint: Urinary retention  HPI: Brad Harrell is a 70 y.o. 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  Pt states that he has difficulty urinating, which has been progressively getting worse today. He also has abdominal distention, and mild tenderness over suprapubic area, which has now resolved after Foley cath is placed and 999 cc of urine removed. Patient states that he has dribbling and difficulty urinating which has been going on for a long time. He denies burning or dysuria. Patient does not have nausea, vomiting, diarrhea. No chest pain, cough, fever or chills. He has chronic mild shortness of breath, which has not changed. 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 No new unilateral weakness, vision change or hearing loss.  ED Course: pt was found to have negative urinalysis, WBC 9.9, lactic acid 3.8, bicarbonate 20, normal anion gap, creatinine normal, temperature 96.9, slightly tachycardia, no tachypnea, saturation 96 on room air. Pending chest x-ray. Patient is placed on telemetry bed for observation.  Review of Systems:   General: no fevers, chills, no changes in body weight, has fatigue HEENT: no hearing changes or sore throat. Right eye blindness. Respiratory: has mild dyspnea, no coughing, wheezing CV: no chest pain, no palpitations GI: no nausea, vomiting, has mild suprapubic abdominal pain, no  diarrhea, constipation GU: no dysuria, burning on urination, increased urinary frequency, hematuria  Ext: no leg edema Neuro: has bilateral weakness, no hearing loss Skin: no rash, no skin tear. MSK: No muscle spasm, no deformity, no limitation of range of movement in spin Heme: No easy bruising.  Travel history: No recent long distant travel.  Allergy: No Known Allergies  Past Medical History:  Diagnosis Date  . Anemia    Iron Defficiency  . Borderline diabetes   . Cardiomyopathy ([place])   . Combined systolic and diastolic congestive heart failure ([place])   . H/O cocaine abuse   . Hyperlipidemia   . Hypertension   . Hypothyroidism   . Liver abscess    measuring 7 x 9 cm/notes 03/04/2013  . Stroke [facility])    "weaker on my left side since" (03/04/2013)  . Urinary retention     Past Surgical History:  Procedure Laterality Date  . APPENDECTOMY    . COLONOSCOPY N/A 03/11/2013   Procedure: COLONOSCOPY;  Surgeon: Beryle Beams, MD;  Location: [place];  Service: Endoscopy;  Laterality: N/A;  . ESOPHAGOGASTRODUODENOSCOPY N/A 03/11/2013   Procedure: ESOPHAGOGASTRODUODENOSCOPY (EGD);  Surgeon: Beryle Beams, MD;  Location: [facility] ENDOSCOPY;  Service: Endoscopy;  Laterality: N/A;  . GIVENS CAPSULE STUDY N/A 03/12/2013   Procedure: GIVENS CAPSULE STUDY;  Surgeon: Beryle Beams, MD;  Location: [place];  Service: Endoscopy;  Laterality: N/A;  . INGUINAL HERNIA REPAIR Right   . TIBIA FRACTURE SURGERY Left     Social History:  reports that he has quit smoking. His smoking use included Cigarettes. He has a 51.00 pack-year smoking history. He has never used smokeless tobacco. He reports that he uses drugs,  including Cocaine. He reports that he does not drink alcohol.  Family History:  Family History  Problem Relation Age of Onset  . Hypertension Mother   . Hypertension Father   . Diabetes type II Father   . Hypertension Brother      Prior to Admission medications   Medication Sig  Start Date End Date Taking? Authorizing Provider  aspirin EC 81 MG tablet Take 81 mg by mouth daily.   Yes Historical Provider, MD  atorvastatin (LIPITOR) 20 MG tablet TAKE ONE TABLET BY MOUTH DAILY 09/07/14  Yes Tiffany L Reed, DO  carvedilol (COREG) 6.25 MG tablet Take 6.25 mg by mouth 2 (two) times daily with a meal.   Yes Historical Provider, MD  clopidogrel (PLAVIX) 75 MG tablet Take 75 mg by mouth daily.   Yes Historical Provider, MD  doxazosin (CARDURA) 4 MG tablet Take 8 mg by mouth at bedtime.    Yes Historical Provider, MD  levothyroxine (SYNTHROID, LEVOTHROID) 100 MCG tablet Take 100 mcg by mouth daily.   Yes Historical Provider, MD  metFORMIN (GLUCOPHAGE) 500 MG tablet Take 500 mg by mouth 2 (two) times daily with a meal.   Yes Historical Provider, MD  carvedilol (COREG) 3.125 MG tablet TAKE TWO TABLETS BY MOUTH TWICE DAILY Patient not taking: Reported on 08/20/2016 09/07/14   Tiffany L Reed, DO  glucose blood (FREESTYLE LITE) test strip Use as instructed 07/31/14   Blanchie Serve, MD  glucose monitoring kit (FREESTYLE) monitoring kit 1 each by Does not apply route as needed for other. For blood sugar check 07/31/14   Blanchie Serve, MD  insulin glargine (LANTUS) 100 UNIT/ML injection Inject 0.16 mLs (16 Units total) into the skin at bedtime. Patient not taking: Reported on 08/20/2016 07/03/14   Elmarie Shiley, MD  Lancets 28G MISC To check blood sugar three times daily with meals and at bedtime 07/31/14   Blanchie Serve, MD  lisinopril (PRINIVIL,ZESTRIL) 2.5 MG tablet Take 1 tablet (2.5 mg total) by mouth daily. Patient not taking: Reported on 08/20/2016 07/21/14   Gayland Curry, DO  Needles & Syringes MISC Needles 31 x 8 and Syringes for new onset diabetes 07/31/14   Blanchie Serve, MD  polyethylene glycol (MIRALAX / GLYCOLAX) packet Take 17 g by mouth 2 (two) times daily. Patient not taking: Reported on 08/20/2016 07/03/14   Belkys A Regalado, MD  senna (SENOKOT) 8.6 MG TABS tablet Take 1  tablet (8.6 mg total) by mouth daily. Patient not taking: Reported on 08/20/2016 07/03/14   Elmarie Shiley, MD    Physical Exam: Vitals:   08/20/16 2339 08/21/16 0000 08/21/16 0015 08/21/16 0045  BP:  (!) 137/103 (!) 133/101   Pulse:  97 110 107  Resp:    20  Temp: (!) 96.9 F (36.1 C)     TempSrc: Rectal     SpO2:  99% 99% 98%  Weight:      Height:       General: Not in acute distress. Dry mucus and membrane. HEENT:       Eyes: left eye PERRL and EOMI. Right eye blindness. No scleral icterus.       ENT: No discharge from the ears and nose, no pharynx injection, no tonsillar enlargement.        Neck: No JVD, no bruit, no mass felt. Heme: No neck lymph node enlargement. Cardiac: S1/S2, RRR, No murmurs, No gallops or rubs. Respiratory: No rales, wheezing, rhonchi or rubs. GI: Soft, nondistended, nontender, no rebound  pain, no organomegaly, BS present. GU: No hematuria Ext: No pitting leg edema bilaterally. 2+DP/PT pulse bilaterally. Musculoskeletal: No joint deformities, No joint redness or warmth, no limitation of ROM in spin. Skin: No rashes.  Neuro: Alert, oriented X3, cranial nerves II-XII grossly intact (right eye blindness), moves all extremities. Psych: Patient is not psychotic, no suicidal or hemocidal ideation.  Labs on Admission: I have personally reviewed following labs and imaging studies  CBC:  Recent Labs Lab 08/20/16 2230  WBC 9.9  NEUTROABS 7.8*  HGB 14.5  HCT 42.7  MCV 85.9  PLT 027   Basic Metabolic Panel:  Recent Labs Lab 08/20/16 2230  NA 137  K 4.2  CL 105  CO2 20*  GLUCOSE 199*  BUN 15  CREATININE 0.98  CALCIUM 10.2   GFR: Estimated Creatinine Clearance: 62.3 mL/min (by C-G formula based on SCr of 0.98 mg/dL). Liver Function Tests:  Recent Labs Lab 08/20/16 2230  AST 18  ALT 14*  ALKPHOS 49  BILITOT 0.8  PROT 8.4*  ALBUMIN 4.5   No results for input(s): LIPASE, AMYLASE in the last 168 hours. No results for input(s):  AMMONIA in the last 168 hours. Coagulation Profile: No results for input(s): INR, PROTIME in the last 168 hours. Cardiac Enzymes: No results for input(s): CKTOTAL, CKMB, CKMBINDEX, TROPONINI in the last 168 hours. BNP (last 3 results) No results for input(s): PROBNP in the last 8760 hours. HbA1C: No results for input(s): HGBA1C in the last 72 hours. CBG: No results for input(s): GLUCAP in the last 168 hours. Lipid Profile: No results for input(s): CHOL, HDL, LDLCALC, TRIG, CHOLHDL, LDLDIRECT in the last 72 hours. Thyroid Function Tests: No results for input(s): TSH, T4TOTAL, FREET4, T3FREE, THYROIDAB in the last 72 hours. Anemia Panel: No results for input(s): VITAMINB12, FOLATE, FERRITIN, TIBC, IRON, RETICCTPCT in the last 72 hours. Urine analysis:    Component Value Date/Time   COLORURINE YELLOW 08/20/2016 [place] 08/20/2016 2335   LABSPEC 1.019 08/20/2016 2335   PHURINE 6.0 08/20/2016 2335   GLUCOSEU NEGATIVE 08/20/2016 2335   HGBUR LARGE (A) 08/20/2016 2335   BILIRUBINUR NEGATIVE 08/20/2016 2335   KETONESUR 15 (A) 08/20/2016 2335   PROTEINUR NEGATIVE 08/20/2016 2335   UROBILINOGEN 1.0 06/30/2014 1213   NITRITE NEGATIVE 08/20/2016 2335   LEUKOCYTESUR NEGATIVE 08/20/2016 2335   Sepsis Labs: _0 (procalcitonin:4,lacticidven:4) )No results found for this or any previous visit (from the past 240 hour(s)).   Radiological Exams on Admission: No results found.   EKG: Independently reviewed.  Sinus rhythm, QTC 443, low voltage, mild T-wave inversion in V3-V6 and in inferior leads.  Assessment/Plan Principal Problem:   Urinary retention Active Problems:   Hyperlipidemia   H/O cocaine abuse   Hypertension   History of CVA    Hypothermia   Metabolic acidosis   Elevated lactic acid level   Combined systolic and diastolic congestive heart failure (HCC)   Urinary retention: Etiology is not clear. Patient likely to have BPH. He dose carry diagnosis  of BPH, but he is taking doxazosin. -will place on tele bed for obs -continue doxazosin -add Proscar -Patient may need referral to urologist -Foley cath inserted  Metabolic acidosis and elevated lactic acid: Patient does not have signs of infection, dose not seem to have sepsis. Urinalysis negative, chest x-ray is negative. Likely due to dehydration. -will get Procalcitonin and trend lactic acid levels per sepsis protocol. -IVF: 2L of NS bolus in ED, followed by 75 cc/h  - f/u Bx  and Ux  HLD: Last LDL was 59 on 06/30/14 -Continue home medications: Lipitor  HTN: -continue Coreg -Also on doxazosin  History of CVA : -Continue aspirin, Lipitor  Hypothermia: -resolved with warm blanket  Combined systolic and diastolic congestive heart failure ([place]): 2d echo on 07/01/14 showed EF of 30-35% with grade 1 diastolic dysfunction. Patient does not have leg edema or JVD. CHF is compensated. Patient is not taking diuretics at home. -Continue aspirin coreg  DVT ppx: SQ Lovenox Code Status: Full code Family Communication: Yes, patient's daughter at bed side Disposition Plan:  Anticipate discharge back to previous home environment Consults called:  none Admission status: Obs / tele    Date of Service 08/21/2016    Ivor Costa [facility] Pager [phone]  If 7PM-7AM, please contact night-coverage [email or web address] Password [email or web address] 08/21/2016, 1:25 AM

## 2016-08-21 NOTE — ED Notes (Signed)
IV nurse will try to get 2nd set of blood cultures.

## 2016-08-21 NOTE — ED Notes (Signed)
Admitting MD at bedside.

## 2016-08-22 LAB — URINE CULTURE: CULTURE: NO GROWTH

## 2016-08-22 LAB — LACTIC ACID, PLASMA: LACTIC ACID, VENOUS: 2.6 mmol/L — AB (ref 0.5–1.9)

## 2016-08-26 LAB — CULTURE, BLOOD (ROUTINE X 2)
CULTURE: NO GROWTH
Culture: NO GROWTH

## 2016-09-20 ENCOUNTER — Encounter (HOSPITAL_COMMUNITY): Payer: Self-pay | Admitting: *Deleted

## 2016-09-20 ENCOUNTER — Emergency Department (HOSPITAL_COMMUNITY)
Admission: EM | Admit: 2016-09-20 | Discharge: 2016-09-20 | Disposition: A | Discharge status: Home / Self Care | Payer: Medicare Other | Attending: Emergency Medicine | Admitting: Emergency Medicine

## 2016-09-20 DIAGNOSIS — I5040 Unspecified combined systolic (congestive) and diastolic (congestive) heart failure: Secondary | ICD-10-CM | POA: Insufficient documentation

## 2016-09-20 DIAGNOSIS — E039 Hypothyroidism, unspecified: Secondary | ICD-10-CM | POA: Diagnosis not present

## 2016-09-20 DIAGNOSIS — Z7982 Long term (current) use of aspirin: Secondary | ICD-10-CM | POA: Diagnosis not present

## 2016-09-20 DIAGNOSIS — Z87891 Personal history of nicotine dependence: Secondary | ICD-10-CM | POA: Insufficient documentation

## 2016-09-20 DIAGNOSIS — Z8673 Personal history of transient ischemic attack (TIA), and cerebral infarction without residual deficits: Secondary | ICD-10-CM | POA: Diagnosis not present

## 2016-09-20 DIAGNOSIS — Z7984 Long term (current) use of oral hypoglycemic drugs: Secondary | ICD-10-CM | POA: Insufficient documentation

## 2016-09-20 DIAGNOSIS — I110 Hypertensive heart disease with heart failure: Secondary | ICD-10-CM | POA: Diagnosis not present

## 2016-09-20 DIAGNOSIS — N390 Urinary tract infection, site not specified: Secondary | ICD-10-CM | POA: Insufficient documentation

## 2016-09-20 DIAGNOSIS — R339 Retention of urine, unspecified: Secondary | ICD-10-CM

## 2016-09-20 LAB — URINE MICROSCOPIC-ADD ON

## 2016-09-20 LAB — URINALYSIS, ROUTINE W REFLEX MICROSCOPIC
Bilirubin Urine: NEGATIVE
Glucose, UA: NEGATIVE mg/dL
KETONES UR: 15 mg/dL — AB
NITRITE: NEGATIVE
PH: 5 (ref 5.0–8.0)
Protein, ur: 100 mg/dL — AB
SPECIFIC GRAVITY, URINE: 1.018 (ref 1.005–1.030)

## 2016-09-20 MED ORDER — DEXTROSE 5 % IV SOLN: 1.0000 g | Freq: Once | INTRAVENOUS | Status: DC

## 2016-09-20 MED ORDER — CEPHALEXIN 250 MG PO CAPS
500.0000 mg | ORAL_CAPSULE | Freq: Once | ORAL | Status: AC
  Administered 2016-09-20: 500 mg via ORAL

## 2016-09-20 MED ORDER — CEPHALEXIN 250 MG PO CAPS
500.0000 mg | ORAL_CAPSULE | Freq: Once | ORAL | Status: DC
  Filled 2016-09-20: qty 2

## 2016-09-20 MED ORDER — CEPHALEXIN 500 MG PO CAPS: 500.0000 mg | ORAL_CAPSULE | Freq: Four times a day (QID) | ORAL | 0 refills | Status: AC

## 2016-09-20 MED ORDER — CARVEDILOL 12.5 MG PO TABS
6.2500 mg | ORAL_TABLET | Freq: Two times a day (BID) | ORAL | Status: DC
  Administered 2016-09-20: 6.25 mg via ORAL
  Filled 2016-09-20: qty 1

## 2016-09-20 NOTE — ED Notes (Signed)
Report given to oncoming RN.

## 2016-09-20 NOTE — ED Notes (Signed)
Verbal order to change foley catheter given by Dr.Katz, who will order catheter change. Previous bladder scan done at 1839, showed 871ml. Pericare completed and post catheter care also completed

## 2016-09-20 NOTE — ED Provider Notes (Signed)
[facility] DEPT Provider Note   CSN: [id number] Arrival date & time: 09/20/16  1655     History   Chief Complaint Chief Complaint  Patient presents with  . Urinary Retention    HPI Brad Harrell is a 70 y.o. male.  The history is provided by the patient.  Illness  This is a new problem. The current episode started 12 to 24 hours ago. The problem occurs constantly. The problem has not changed since onset.Pertinent negatives include no chest pain, no abdominal pain, no headaches and no shortness of breath. Associated symptoms comments: Urinary retention. Nothing aggravates the symptoms. Nothing relieves the symptoms. He has tried nothing for the symptoms. The treatment provided no relief.    Past Medical History:  Diagnosis Date  . Anemia    Iron Defficiency  . Borderline diabetes   . Cardiomyopathy ([place])   . Combined systolic and diastolic congestive heart failure ([place])   . H/O cocaine abuse   . Hyperlipidemia   . Hypertension   . Hypothyroidism   . Liver abscess    measuring 7 x 9 cm/notes 03/04/2013  . Stroke [facility])    "weaker on my left side since" (03/04/2013)  . Urinary retention     Patient Active Problem List   Diagnosis Date Noted  . Hypothermia 08/21/2016  . Metabolic acidosis 79/89/2119  . Elevated lactic acid level 08/21/2016  . Combined systolic and diastolic congestive heart failure ([place])   . Diabetes mellitus, new onset ([place]) 07/01/2014  . Left arm weakness 07/01/2014  . History of CVA  07/01/2014  . Urinary tract infection 07/01/2014  . Thrombocytopenia ([place]) 07/01/2014  . DKA (diabetic ketoacidoses) ([place]) 06/30/2014  . Physical deconditioning 03/13/2013  . Anemia, iron deficiency 03/08/2013  . Bacteremia 03/06/2013  . History of Liver abscess 03/05/2013  . Encephalopathy, metabolic 41/74/0814  . Anemia 03/05/2013  . Complicated UTI (urinary tract infection) 08/15/2012  . SIRS (systemic inflammatory response syndrome) ([place]) 08/15/2012  .  ARF (acute renal failure) ([place]) 08/15/2012  . Hypertension   . Urinary retention   . Stroke ([place])   . Hyperlipidemia   . Cardiomyopathy ([place])   . H/O cocaine abuse     Past Surgical History:  Procedure Laterality Date  . APPENDECTOMY    . COLONOSCOPY N/A 03/11/2013   Procedure: COLONOSCOPY;  Surgeon: Beryle Beams, MD;  Location: [place];  Service: Endoscopy;  Laterality: N/A;  . ESOPHAGOGASTRODUODENOSCOPY N/A 03/11/2013   Procedure: ESOPHAGOGASTRODUODENOSCOPY (EGD);  Surgeon: Beryle Beams, MD;  Location: [facility] ENDOSCOPY;  Service: Endoscopy;  Laterality: N/A;  . GIVENS CAPSULE STUDY N/A 03/12/2013   Procedure: GIVENS CAPSULE STUDY;  Surgeon: Beryle Beams, MD;  Location: [place];  Service: Endoscopy;  Laterality: N/A;  . INGUINAL HERNIA REPAIR Right   . TIBIA FRACTURE SURGERY Left        Home Medications    Prior to Admission medications   Medication Sig Start Date End Date Taking? Authorizing Provider  acetaminophen (TYLENOL) 325 MG tablet Take 2 tablets (650 mg total) by mouth every 6 (six) hours as needed for mild pain (or Fever >/= 101). 08/21/16  Yes Geradine Girt, DO  aspirin EC 81 MG tablet Take 81 mg by mouth daily.   Yes Historical Provider, MD  atorvastatin (LIPITOR) 20 MG tablet TAKE ONE TABLET BY MOUTH DAILY 09/07/14  Yes Tiffany L Reed, DO  carvedilol (COREG) 6.25 MG tablet Take 6.25 mg by mouth 2 (two) times daily with a meal.  Yes Historical Provider, MD  clopidogrel (PLAVIX) 75 MG tablet Take 75 mg by mouth daily.   Yes Historical Provider, MD  doxazosin (CARDURA) 4 MG tablet Take 8 mg by mouth at bedtime.    Yes Historical Provider, MD  finasteride (PROSCAR) 5 MG tablet Take 1 tablet (5 mg total) by mouth daily. 08/22/16  Yes Geradine Girt, DO  levothyroxine (SYNTHROID, LEVOTHROID) 100 MCG tablet Take 100 mcg by mouth daily.   Yes Historical Provider, MD  metFORMIN (GLUCOPHAGE) 500 MG tablet Take 500 mg by mouth 2 (two) times daily with a meal.   Yes  Historical Provider, MD  cephALEXin (KEFLEX) 500 MG capsule Take 1 capsule (500 mg total) by mouth 4 (four) times daily. 09/20/16 09/30/16  Dewaine Conger, MD  glucose blood (FREESTYLE LITE) test strip Use as instructed 07/31/14   Blanchie Serve, MD  glucose monitoring kit (FREESTYLE) monitoring kit 1 each by Does not apply route as needed for other. For blood sugar check 07/31/14   Blanchie Serve, MD  Lancets 28G MISC To check blood sugar three times daily with meals and at bedtime 07/31/14   Blanchie Serve, MD    Family History Family History  Problem Relation Age of Onset  . Hypertension Mother   . Hypertension Father   . Diabetes type II Father   . Hypertension Brother     Social History Social History  Substance Use Topics  . Smoking status: Former Smoker    Packs/day: 1.00    Years: 51.00    Types: Cigarettes  . Smokeless tobacco: Never Used     Comment: 03/04/2013 "quit smoking cigarettes over 2 yr ago"  . Alcohol use No     Comment: 03/04/2013 "last alcohol was ~ 2 yr ago too"     Allergies   Patient has no known allergies.   Review of Systems Review of Systems  Constitutional: Negative for chills and fever.  HENT: Negative for ear pain and sore throat.   Eyes: Negative for pain and visual disturbance.  Respiratory: Negative for cough and shortness of breath.   Cardiovascular: Negative for chest pain and palpitations.  Gastrointestinal: Negative for abdominal pain and vomiting.  Genitourinary: Positive for decreased urine volume and difficulty urinating. Negative for discharge, dysuria, enuresis, flank pain, hematuria, penile pain and testicular pain.  Musculoskeletal: Negative for arthralgias and back pain.  Skin: Negative for color change and rash.  Neurological: Negative for seizures, syncope and headaches.  All other systems reviewed and are negative.    Physical Exam Updated Vital Signs BP 161/91   Pulse 85   Temp 98.6 F (37 C) (Oral)   Resp 18   SpO2 99%    Physical Exam  Constitutional: He appears well-developed and well-nourished.  HENT:  Head: Normocephalic and atraumatic.  Eyes: Conjunctivae are normal.  Neck: Neck supple.  Cardiovascular: Normal rate and regular rhythm.   No murmur heard. Pulmonary/Chest: Effort normal and breath sounds normal. No respiratory distress.  Abdominal: Soft. There is tenderness in the suprapubic area. There is no rigidity, no rebound, no guarding and no CVA tenderness. Hernia confirmed negative in the right inguinal area and confirmed negative in the left inguinal area.  Genitourinary: Testes normal and penis normal. Uncircumcised.  Musculoskeletal: He exhibits no edema.  Neurological: He is alert.  Skin: Skin is warm and dry.  Psychiatric: He has a normal mood and affect.  Nursing note and vitals reviewed.    ED Treatments / Results  Labs (all labs ordered  are listed, but only abnormal results are displayed) Labs Reviewed  URINALYSIS, ROUTINE W REFLEX MICROSCOPIC (NOT AT [facility]) - Abnormal; Notable for the following:       Result Value   Color, Urine AMBER (*)    APPearance TURBID (*)    Hgb urine dipstick LARGE (*)    Ketones, ur 15 (*)    Protein, ur 100 (*)    Leukocytes, UA LARGE (*)    All other components within normal limits  URINE MICROSCOPIC-ADD ON - Abnormal; Notable for the following:    Squamous Epithelial / LPF 0-5 (*)    Bacteria, UA MANY (*)    All other components within normal limits  URINE CULTURE    EKG  EKG Interpretation None       Radiology No results found.  Procedures Procedures (including critical care time)  Medications Ordered in ED Medications  carvedilol (COREG) tablet 6.25 mg (6.25 mg Oral Given 09/20/16 1955)  cephALEXin (KEFLEX) capsule 500 mg (500 mg Oral Given 09/20/16 2151)     Initial Impression / Assessment and Plan / ED Course  I have reviewed the triage vital signs and the nursing notes.  Pertinent labs & imaging results that were  available during my care of the patient were reviewed by me and considered in my medical decision making (see chart for details).  Clinical Course     70 year old male comes with complaint of urinary retention. He'll Foley catheter placed because of his urinary retention does not produce any urine today. He denies fevers chills or flank pain. There is no fluid in his Foley bag. He is mildly distended lower abdomen with tenderness to palpation. He's had issues with this before. Her Foley is replaced and hazy dark yellow urine comes out possibly concerning for purulent urine. Urinalysis shows likely urinary tract infection. Patient is over a liter of fluid out now and is decompressed. His discomfort is relieved. Vital signs are stable. No need for further laboratory evaluation or testing. He will be treated with cephalosporin. Vital signs stable to discharge. Strict return precautions were given.  Final Clinical Impressions(s) / ED Diagnoses   Final diagnoses:  Urinary retention  Lower urinary tract infectious disease    New Prescriptions Discharge Medication List as of 09/20/2016  9:50 PM    START taking these medications   Details  cephALEXin (KEFLEX) 500 MG capsule Take 1 capsule (500 mg total) by mouth 4 (four) times daily., Starting Wed 09/20/2016, Until Sat 09/30/2016, Print         Dewaine Conger, MD 09/21/16 2725    Gareth Morgan, MD 09/22/16 1436

## 2016-09-20 NOTE — ED Triage Notes (Signed)
Pt has foley leg bag in place x 2 weeks. Reports that it stopped draining today and now has discomfort. HR 140 at triage.

## 2016-09-23 LAB — URINE CULTURE

## 2016-09-24 ENCOUNTER — Telehealth (HOSPITAL_BASED_OUTPATIENT_CLINIC_OR_DEPARTMENT_OTHER): Payer: Self-pay

## 2016-09-24 NOTE — Telephone Encounter (Signed)
Post ED Visit - Positive Culture Follow-up: Successful Patient Follow-Up  Culture assessed and recommendations reviewed by: []  Elenor Quinones, Pharm.D. []  Heide Guile, Pharm.D., BCPS []  Parks Neptune, Pharm.D. []  Alycia Rossetti, Pharm.D., BCPS []  [place], Pharm.D., BCPS, AAHIVP []  Legrand Como, Pharm.D., BCPS, AAHIVP []  Cassie Stewart, Pharm.D. []  Rob Evette Doffing, Pharm.D. Sharilyn Sites Pharm D Positive urine culture  []  Patient discharged without antimicrobial prescription and treatment is now indicated [x]  Organism is resistant to prescribed ED discharge antimicrobial []  Patient with positive blood cultures  Changes discussed with ED provider: [facility] New antibiotic prescription Amoxcillin 500 mg PO Q12H x 7 days Called to [place]  Contacted patient, date 09/24/16, time [place]   Brad Harrell, [place] Comment 09/24/2016, 10:46 AM

## 2016-09-24 NOTE — Progress Notes (Signed)
ED Antimicrobial Stewardship Positive Culture Follow Up   Brad Harrell is an 70 y.o. male who presented to [facility] on 09/20/2016 with a chief complaint of urinary retention and dirty UA.  Chief Complaint  Patient presents with  . Urinary Retention    Recent Results (from the past 720 hour(s))  Urine culture     Status: Abnormal   Collection Time: 09/20/16  7:40 PM  Result Value Ref Range Status   Specimen Description URINE, CATHETERIZED  Final   Special Requests NONE  Final   Culture >=100,000 COLONIES/mL ENTEROCOCCUS FAECALIS (A)  Final   Report Status 09/23/2016 FINAL  Final   Organism ID, Bacteria ENTEROCOCCUS FAECALIS (A)  Final      Susceptibility   Enterococcus faecalis - MIC*    AMPICILLIN <=2 SENSITIVE Sensitive     LEVOFLOXACIN 1 SENSITIVE Sensitive     NITROFURANTOIN <=16 SENSITIVE Sensitive     VANCOMYCIN 1 SENSITIVE Sensitive     * >=100,000 COLONIES/mL ENTEROCOCCUS FAECALIS    [x]  Treated with cephalexin, organism resistant to prescribed antimicrobial []  Patient discharged originally without antimicrobial agent and treatment is now indicated  New antibiotic prescription: amoxicillin 500mg  PO q12h for 7 days  ED Provider: Susa Griffins. Diona Foley, PharmD, BCPS Clinical Pharmacist 09/24/2016, 9:11 AM Infectious Diseases Pharmacist

## 2016-10-05 ENCOUNTER — Other Ambulatory Visit: Payer: Self-pay

## 2016-10-15 ENCOUNTER — Encounter (HOSPITAL_COMMUNITY): Payer: Self-pay

## 2016-10-15 ENCOUNTER — Encounter (HOSPITAL_COMMUNITY): Payer: Self-pay | Admitting: *Deleted

## 2016-10-15 ENCOUNTER — Emergency Department (HOSPITAL_COMMUNITY): Payer: Medicare Other

## 2016-10-15 ENCOUNTER — Emergency Department (HOSPITAL_COMMUNITY)
Admission: EM | Admit: 2016-10-15 | Discharge: 2016-10-15 | Disposition: A | Discharge status: Home / Self Care | Payer: Medicare Other | Attending: Emergency Medicine | Admitting: Emergency Medicine

## 2016-10-15 ENCOUNTER — Emergency Department (HOSPITAL_COMMUNITY)
Admission: EM | Admit: 2016-10-15 | Discharge: 2016-10-15 | Disposition: A | Discharge status: Home / Self Care | Payer: Medicare Other | Source: Home / Self Care | Attending: Emergency Medicine | Admitting: Emergency Medicine

## 2016-10-15 DIAGNOSIS — Z87891 Personal history of nicotine dependence: Secondary | ICD-10-CM | POA: Diagnosis not present

## 2016-10-15 DIAGNOSIS — I110 Hypertensive heart disease with heart failure: Secondary | ICD-10-CM

## 2016-10-15 DIAGNOSIS — N390 Urinary tract infection, site not specified: Secondary | ICD-10-CM

## 2016-10-15 DIAGNOSIS — Z7984 Long term (current) use of oral hypoglycemic drugs: Secondary | ICD-10-CM | POA: Insufficient documentation

## 2016-10-15 DIAGNOSIS — E119 Type 2 diabetes mellitus without complications: Secondary | ICD-10-CM | POA: Insufficient documentation

## 2016-10-15 DIAGNOSIS — I5040 Unspecified combined systolic (congestive) and diastolic (congestive) heart failure: Secondary | ICD-10-CM | POA: Insufficient documentation

## 2016-10-15 DIAGNOSIS — R339 Retention of urine, unspecified: Secondary | ICD-10-CM | POA: Insufficient documentation

## 2016-10-15 DIAGNOSIS — Z8673 Personal history of transient ischemic attack (TIA), and cerebral infarction without residual deficits: Secondary | ICD-10-CM | POA: Diagnosis not present

## 2016-10-15 DIAGNOSIS — R319 Hematuria, unspecified: Secondary | ICD-10-CM | POA: Diagnosis present

## 2016-10-15 DIAGNOSIS — Z7982 Long term (current) use of aspirin: Secondary | ICD-10-CM | POA: Insufficient documentation

## 2016-10-15 DIAGNOSIS — E039 Hypothyroidism, unspecified: Secondary | ICD-10-CM | POA: Insufficient documentation

## 2016-10-15 DIAGNOSIS — Z79899 Other long term (current) drug therapy: Secondary | ICD-10-CM | POA: Insufficient documentation

## 2016-10-15 DIAGNOSIS — R140 Abdominal distension (gaseous): Secondary | ICD-10-CM

## 2016-10-15 LAB — CBC WITH DIFFERENTIAL/PLATELET
BASOS PCT: 0 %
Basophils Absolute: 0 10*3/uL (ref 0.0–0.1)
EOS PCT: 2 %
Eosinophils Absolute: 0.2 10*3/uL (ref 0.0–0.7)
HEMATOCRIT: 41 % (ref 39.0–52.0)
Hemoglobin: 13.5 g/dL (ref 13.0–17.0)
Lymphocytes Relative: 14 %
Lymphs Abs: 1.2 10*3/uL (ref 0.7–4.0)
MCH: 28.5 pg (ref 26.0–34.0)
MCHC: 32.9 g/dL (ref 30.0–36.0)
MCV: 86.5 fL (ref 78.0–100.0)
MONO ABS: 0.7 10*3/uL (ref 0.1–1.0)
MONOS PCT: 7 %
NEUTROS ABS: 7.1 10*3/uL (ref 1.7–7.7)
Neutrophils Relative %: 77 %
PLATELETS: 174 10*3/uL (ref 150–400)
RBC: 4.74 MIL/uL (ref 4.22–5.81)
RDW: 13.4 % (ref 11.5–15.5)
WBC: 9.2 10*3/uL (ref 4.0–10.5)

## 2016-10-15 LAB — COMPREHENSIVE METABOLIC PANEL
ALT: 9 U/L — ABNORMAL LOW (ref 17–63)
ANION GAP: 11 (ref 5–15)
AST: 12 U/L — ABNORMAL LOW (ref 15–41)
Albumin: 4.1 g/dL (ref 3.5–5.0)
Alkaline Phosphatase: 53 U/L (ref 38–126)
BILIRUBIN TOTAL: 0.8 mg/dL (ref 0.3–1.2)
BUN: 18 mg/dL (ref 6–20)
CHLORIDE: 110 mmol/L (ref 101–111)
CO2: 21 mmol/L — ABNORMAL LOW (ref 22–32)
Calcium: 9.9 mg/dL (ref 8.9–10.3)
Creatinine, Ser: 0.98 mg/dL (ref 0.61–1.24)
GFR calc Af Amer: >60 mL/min (ref >60)
Glucose, Bld: 162 mg/dL — ABNORMAL HIGH (ref 65–99)
POTASSIUM: 4 mmol/L (ref 3.5–5.1)
Sodium: 142 mmol/L (ref 135–145)
TOTAL PROTEIN: 8 g/dL (ref 6.5–8.1)

## 2016-10-15 LAB — URINALYSIS, ROUTINE W REFLEX MICROSCOPIC
Bilirubin Urine: NEGATIVE
GLUCOSE, UA: NEGATIVE mg/dL
KETONES UR: NEGATIVE mg/dL
NITRITE: NEGATIVE
PH: 5 (ref 5.0–8.0)
PROTEIN: 100 mg/dL — AB
SQUAMOUS EPITHELIAL / LPF: NONE SEEN
Specific Gravity, Urine: 1.017 (ref 1.005–1.030)

## 2016-10-15 LAB — I-STAT CG4 LACTIC ACID, ED
LACTIC ACID, VENOUS: 1.09 mmol/L (ref 0.5–1.9)
LACTIC ACID, VENOUS: 0.86 mmol/L (ref 0.5–1.9)

## 2016-10-15 LAB — LIPASE, BLOOD: LIPASE: 21 U/L (ref 11–51)

## 2016-10-15 MED ORDER — HYDROMORPHONE HCL 2 MG/ML IJ SOLN
0.5000 mg | Freq: Once | INTRAMUSCULAR | Status: AC
Start: 2016-10-15 — End: 2016-10-15
  Administered 2016-10-15: 0.5 mg via INTRAVENOUS
  Filled 2016-10-15: qty 1

## 2016-10-15 MED ORDER — HYDROMORPHONE HCL 2 MG/ML IJ SOLN
1.0000 mg | Freq: Once | INTRAMUSCULAR | Status: AC
  Administered 2016-10-15: 1 mg via INTRAVENOUS
  Filled 2016-10-15: qty 1

## 2016-10-15 MED ORDER — DEXTROSE 5 % IV SOLN
1.0000 g | Freq: Once | INTRAVENOUS | Status: AC
  Administered 2016-10-15: 1 g via INTRAVENOUS
  Filled 2016-10-15: qty 10

## 2016-10-15 MED ORDER — LEVOFLOXACIN 750 MG PO TABS: 750.0000 mg | ORAL_TABLET | Freq: Every day | ORAL | 0 refills | Status: AC

## 2016-10-15 MED ORDER — IOPAMIDOL (ISOVUE-300) INJECTION 61%
INTRAVENOUS | Status: AC
  Administered 2016-10-15: 100 mL
  Filled 2016-10-15: qty 100

## 2016-10-15 MED ORDER — SODIUM CHLORIDE 0.9 % IV BOLUS (SEPSIS)
1000.0000 mL | Freq: Once | INTRAVENOUS | Status: AC
  Administered 2016-10-15: 1000 mL via INTRAVENOUS

## 2016-10-15 MED ORDER — HYDROMORPHONE HCL 2 MG/ML IJ SOLN: 1.0000 mg | Freq: Once | INTRAMUSCULAR | Status: DC

## 2016-10-15 NOTE — ED Triage Notes (Signed)
The pt is here with urinary retention  He was here today and had a catheter placed  He has a small amount of uirne in his foley bag.  He feels like he is full but no drainage since he left at 1500

## 2016-10-15 NOTE — Discharge Instructions (Signed)
Follow-up with urology as scheduled for later this week. Return for any recurrent retention problems. Continue your antibiotics.

## 2016-10-15 NOTE — ED Notes (Signed)
This nurse went into patients room to discharge him. IV removed and started to dress pt in paper scrubs, this nurse noticed blood clots from penis. MD made aware and reassessing. Will continue to monitor.

## 2016-10-15 NOTE — ED Notes (Signed)
Foley irrigated. Bladder emptying into foley bag. No bleeding noted to meatus; dried blood noted on tubing.

## 2016-10-15 NOTE — ED Notes (Signed)
Bladder scanned 36 cc.

## 2016-10-15 NOTE — ED Notes (Signed)
Bladder scanned Pt has 735 mL in bladder

## 2016-10-15 NOTE — Discharge Instructions (Signed)
Please follow-up with your urologist as scheduled this week. These take your medications for treating UTI. If symptoms return or worsen, please return to the nearest emergency department.

## 2016-10-15 NOTE — ED Triage Notes (Signed)
Pt states that he has chronic urinary cath, states that he had blood on his underwear tonight denies hematuria.  Denies any trauma or pulling at cathter

## 2016-10-15 NOTE — ED Notes (Signed)
Patient transported to CT 

## 2016-10-15 NOTE — ED Provider Notes (Signed)
[facility] DEPT Provider Note   CSN: [id number] Arrival date & time: 10/15/16  2050     History   Chief Complaint Chief Complaint  Patient presents with  . Urinary Retention    HPI Brad Harrell is a 70 y.o. male.  Patient presenting with recurrent urinary retention. Was seen about 4 in the morning. For this. Workup of raise suspicions for urinary tract infection. Patient treated with IV Rocephin and given prescription and follow-up with urology. Patient has appointment for follow-up with urology later this week. Patient is a Foley catheter in place for about a month. Patient states that he started to have of the catheter not drain at home. Out in triage a bladder scan showed 700+ cc of urine.      Past Medical History:  Diagnosis Date  . Anemia    Iron Defficiency  . Borderline diabetes   . Cardiomyopathy ([place])   . 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 H/O cocaine abuse   . Hyperlipidemia   . Hypertension   . Hypothyroidism   . Liver abscess    measuring 7 x 9 cm/notes 03/04/2013  . Stroke [facility])    "weaker on my left side since" (03/04/2013)  . Urinary retention     Patient Active Problem List   Diagnosis Date Noted  . Hypothermia 08/21/2016  . Metabolic acidosis 76/73/4193  . Elevated lactic acid level 08/21/2016  . 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 Diabetes mellitus, new onset ([place]) 07/01/2014  . Left arm weakness 07/01/2014  . History of CVA  07/01/2014  . Urinary tract infection 07/01/2014  . Thrombocytopenia ([place]) 07/01/2014  . DKA (diabetic ketoacidoses) ([place]) 06/30/2014  . Physical deconditioning 03/13/2013  . Anemia, iron deficiency 03/08/2013  . Bacteremia 03/06/2013  . History of Liver abscess 03/05/2013  . Encephalopathy, metabolic 79/12/4095  . Anemia 03/05/2013  . Complicated UTI (urinary tract infection) 08/15/2012  . SIRS (systemic inflammatory response syndrome) ([place])  08/15/2012  . ARF (acute renal failure) ([place]) 08/15/2012  . Hypertension   . Urinary retention   . Stroke ([place])   . Hyperlipidemia   . Cardiomyopathy ([place])   . H/O cocaine abuse     Past Surgical History:  Procedure Laterality Date  . APPENDECTOMY    . COLONOSCOPY N/A 03/11/2013   Procedure: COLONOSCOPY;  Surgeon: Beryle Beams, MD;  Location: [facility];  Service: Endoscopy;  Laterality: N/A;  . ESOPHAGOGASTRODUODENOSCOPY N/A 03/11/2013   Procedure: ESOPHAGOGASTRODUODENOSCOPY (EGD);  Surgeon: Beryle Beams, MD;  Location: [facility] ENDOSCOPY;  Service: Endoscopy;  Laterality: N/A;  . GIVENS CAPSULE STUDY N/A 03/12/2013   Procedure: GIVENS CAPSULE STUDY;  Surgeon: Beryle Beams, MD;  Location: [facility];  Service: Endoscopy;  Laterality: N/A;  . INGUINAL HERNIA REPAIR Right   . TIBIA FRACTURE SURGERY Left        Home Medications    Prior to Admission medications   Medication Sig Start Date End Date Taking? Authorizing Provider  acetaminophen (TYLENOL) 325 MG tablet Take 2 tablets (650 mg total) by mouth every 6 (six) hours as needed for mild pain (or Fever >/= 101). 08/21/16   Geradine Girt, DO  aspirin EC 81 MG tablet Take 81 mg by mouth daily.    Historical Provider, MD  atorvastatin (LIPITOR) 20 MG tablet TAKE ONE TABLET BY MOUTH DAILY Patient taking differently: TAKE 20 MG BY MOUTH DAILY 09/07/14   Tiffany L Reed, DO  carvedilol ([place])  6.25 MG tablet Take 6.25 mg by mouth 2 (two) times daily with a meal.    Historical Provider, MD  clopidogrel (PLAVIX) 75 MG tablet Take 75 mg by mouth daily.    Historical Provider, MD  doxazosin (CARDURA) 8 MG tablet Take 8 mg by mouth at bedtime.     Historical Provider, MD  finasteride (PROSCAR) 5 MG tablet Take 1 tablet (5 mg total) by mouth daily. Patient not taking: Reported on 10/15/2016 08/22/16   Geradine Girt, DO  glucose blood (FREESTYLE LITE) test strip Use as instructed Patient not taking: Reported on 10/15/2016 07/31/14    Blanchie Serve, MD  glucose monitoring kit (FREESTYLE) monitoring kit 1 each by Does not apply route as needed for other. For blood sugar check Patient not taking: Reported on 10/15/2016 07/31/14   Blanchie Serve, MD  Lancets 28G MISC To check blood sugar three times daily with meals and at bedtime Patient not taking: Reported on 10/15/2016 07/31/14   Blanchie Serve, MD  levofloxacin (LEVAQUIN) 750 MG tablet Take 1 tablet (750 mg total) by mouth daily. 10/15/16 10/20/16  Gwenyth Allegra Tegeler, MD  levothyroxine (SYNTHROID, LEVOTHROID) 100 MCG tablet Take 100 mcg by mouth daily.    Historical Provider, MD  metFORMIN (GLUCOPHAGE) 500 MG tablet Take 500 mg by mouth 2 (two) times daily with a meal.    Historical Provider, MD    Family History Family History  Problem Relation Age of Onset  . Hypertension Mother   . Hypertension Father   . Diabetes type II Father   . Hypertension Brother     Social History Social History  Substance Use Topics  . Smoking status: Former Smoker    Packs/day: 1.00    Years: 51.00    Types: Cigarettes  . Smokeless tobacco: Never Used     Comment: 03/04/2013 "quit smoking cigarettes over 2 yr ago"  . Alcohol use No     Comment: 03/04/2013 "last alcohol was ~ 2 yr ago too"     Allergies   Patient has no known allergies.   Review of Systems Review of Systems  Constitutional: Negative for fever.  HENT: Negative for congestion.   Cardiovascular: Negative for chest pain.  Gastrointestinal: Positive for abdominal pain. Negative for nausea and vomiting.  Genitourinary: Positive for difficulty urinating.  Musculoskeletal: Negative for back pain.  Skin: Negative for rash.  Neurological: Negative for headaches.  Psychiatric/Behavioral: Negative for confusion.     Physical Exam Updated Vital Signs BP (!) 187/105 (BP Location: Left Arm)   Pulse 106   Temp 98.3 F (36.8 C) (Oral)   Resp 18   Ht 5' 6"  (1.676 m)   Wt 81.6 kg   SpO2 97%   BMI 29.05 kg/m    Physical Exam  Constitutional: He is oriented to person, place, and time. He appears well-developed and well-nourished. No distress.  HENT:  Head: Normocephalic and atraumatic.  Neck: Neck supple.  Cardiovascular: Normal rate and regular rhythm.   Pulmonary/Chest: Effort normal and breath sounds normal. No respiratory distress.  Abdominal: Soft. Bowel sounds are normal. He exhibits distension. There is no tenderness.  Musculoskeletal: Normal range of motion.  Neurological: He is alert and oriented to person, place, and time.  Skin: Skin is warm.  Nursing note and vitals reviewed.    ED Treatments / Results  Labs (all labs ordered are listed, but only abnormal results are displayed) Labs Reviewed - No data to display  EKG  EKG Interpretation None  Radiology Dg Chest 2 View  Result Date: 10/15/2016 CLINICAL DATA:  Tachycardia.  Abdominal pain. EXAM: CHEST  2 VIEW COMPARISON:  08/21/2016. FINDINGS: The cardiac silhouette remains borderline enlarged. Clear lungs with normal vascularity. Mild diffuse peribronchial thickening. Unremarkable bones. IMPRESSION: Mild bronchitic changes. Electronically Signed   By: Claudie Revering M.D.   On: 10/15/2016 09:09   Ct Abdomen Pelvis W Contrast  Result Date: 10/15/2016 CLINICAL DATA:  Tachycardia, history of urinary retention, noticed blood-stained in his underwear EXAM: CT ABDOMEN AND PELVIS WITH CONTRAST TECHNIQUE: Multidetector CT imaging of the abdomen and pelvis was performed using the standard protocol following bolus administration of intravenous contrast. CONTRAST:  120m ISOVUE-300 IOPAMIDOL (ISOVUE-300) INJECTION 61% COMPARISON:  CT scan 03/04/2013 FINDINGS: Lower chest: Small hiatal hernia.  The lung bases are normal Hepatobiliary: Enhanced liver shows no focal mass. No calcified gallstones are noted within gallbladder. Pancreas: Enhanced pancreas with normal appearance. Spleen: Enhanced spleen is normal. Adrenals/Urinary Tract:  No adrenal gland mass. Enhanced kidneys are symmetrical in size. No hydronephrosis or hydroureter. Delayed renal images shows bilateral renal symmetrical excretion. There is some cortical scarring in lower pole of the right kidney posterior aspect. There is thickening of urinary bladder wall and mild stranding of perivesical fat. Findings highly suspicious for chronic inflammation or cystitis. Again noted enlarged prostate gland with polypoid indentation of urinary bladder base. Prostate gland measures at least 6.5 by 6.2 cm. There is a Foley catheter with balloon inflated within urinary bladder. No definite bladder hematoma is identified. No nephrolithiasis. Tiny right posterior bladder diverticulum measures 1.2 cm. Stomach/Bowel: No small bowel obstruction. No gastric outlet obstruction. The patient is status post appendectomy. No pericecal inflammation. Some colonic stool noted in right colon and transverse colon. Few diverticula are noted sigmoid colon. No evidence of acute colitis or diverticulitis. No distal colonic obstruction. Vascular/Lymphatic: Atherosclerotic calcifications of abdominal aorta and iliac arteries are noted. No adenopathy. Reproductive: Enlarged prostate gland as described above. No pelvic adenopathy. No inguinal adenopathy. Other: No ascites or free abdominal air. Musculoskeletal: No destructive bony lesions are noted. Sagittal images of the spine shows degenerative changes thoracolumbar spine. Significant disc space flattening with vacuum disc phenomenon and endplate sclerotic changes mild anterior and mild posterior spurring at L3-L4 and L5-S1 level. IMPRESSION: 1. No nephrolithiasis.  No hydronephrosis or hydroureter. 2. There is thickening of urinary bladder wall and mild stranding of fat surrounding the bladder. A tiny right posterior bladder diverticulum measures 1.2 cm. Findings are suspicious for chronic bladder inflammation or acute cystitis. Clinical correlation is necessary. 3.  Again noted enlarged prostate gland with polypoid indentation of urinary bladder base. There is a Foley catheter with balloon inflated within urinary bladder. 4. No definite evidence of urinary bladder hematoma. 5. No small bowel or colonic obstruction.  Status post appendectomy. 6. No evidence of colitis or diverticulitis. 7. Atherosclerotic vascular calcifications. 8. Degenerative changes lumbar spine. Electronically Signed   By: LLahoma CrockerM.D.   On: 10/15/2016 09:41    Procedures Procedures (including critical care time)  Medications Ordered in ED Medications - No data to display   Initial Impression / Assessment and Plan / ED Course  I have reviewed the triage vital signs and the nursing notes.  Pertinent labs & imaging results that were available during my care of the patient were reviewed by me and considered in my medical decision making (see chart for details).  Clinical Course     Patient with history of urinary retention problems. Had a Foley  catheter currently for about a month. Seen about 4 in the morning for retention. Foley was irrigated. Good flow. Patient's urinalysis and CT scan suggestive of cystitis. So patient started on antibiotics. Patient had recurrent retention. Bladder scan out in the triage area showed does 700 and plus cc of urine. Here Foley catheter was irrigated we got 350 mL out. Bladder scan repeated only shows about 30 mL. So bladder is sufficiently empty. Patient has prescriptions for antibiotics and follow-up with urology later this week.  Final Clinical Impressions(s) / ED Diagnoses   Final diagnoses:  Urinary retention    New Prescriptions New Prescriptions   No medications on file     Fredia Sorrow, MD 10/15/16 [id number]

## 2016-10-15 NOTE — ED Notes (Signed)
Patient transported to X-ray 

## 2016-10-15 NOTE — ED Provider Notes (Signed)
[facility] DEPT Provider Note   CSN: [id number] Arrival date & time: 10/15/16  0488     History   Chief Complaint Chief Complaint  Patient presents with  . Hematuria    HPI Brad Harrell is a 70 y.o. male with a past medical history significant for hypertension, CHF, substance abuse, hyperlipidemia, hypothyroidism, stroke, prior liver abscess, and recent urinary retention status post Foley catheter placement and UTI status post completion of antibiotic therapy who presents with decreased urine, darkened urine, severe abdominal pain, and bleeding from the meatus around the Foley catheter. Patient is committed by daughter reports that patient has had a Foley catheter for approximately one month for urinary retention. He reports that he had a catheter replaced after it was clogged and had infection. Patient reported taking one antibiotic and then was switched several days later and has completed the course. He says that his catheter was draining normal yesterday however, today, he noted severe abdominal pain, decrease in urine amount although it is still draining, dark in color, and some mild bleeding. He denies hemorrhage around the catheter but reports some small blood on his underwear. He denies any pulling on the Foley or any traumatic injuries. He denies any fevers, chills, chest pain, shortness of breath. He denies nausea, vomiting, constipation, diarrhea. He denies dysuria. He denies any other symptoms.    The history is provided by the patient, a relative and medical records. No language interpreter was used.  Abdominal Pain   This is a new problem. The current episode started 12 to 24 hours ago. The problem occurs constantly. The problem has not changed since onset.The pain is located in the generalized abdominal region. The quality of the pain is cramping and dull. The pain is at a severity of 10/10. The pain is severe. Associated symptoms include hematuria. Pertinent negatives  include anorexia, fever, diarrhea, nausea, vomiting, constipation and headaches. The symptoms are aggravated by palpation. Nothing relieves the symptoms. Past workup comments: foley in place.    Past Medical History:  Diagnosis Date  . Anemia    Iron Defficiency  . Borderline diabetes   . Cardiomyopathy ([place])   . 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 H/O cocaine abuse   . Hyperlipidemia   . Hypertension   . Hypothyroidism   . Liver abscess    measuring 7 x 9 cm/notes 03/04/2013  . Stroke [facility])    "weaker on my left side since" (03/04/2013)  . Urinary retention     Patient Active Problem List   Diagnosis Date Noted  . Hypothermia 08/21/2016  . Metabolic acidosis 89/16/9450  . Elevated lactic acid level 08/21/2016  . 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 Diabetes mellitus, new onset ([place]) 07/01/2014  . Left arm weakness 07/01/2014  . History of CVA  07/01/2014  . Urinary tract infection 07/01/2014  . Thrombocytopenia ([place]) 07/01/2014  . DKA (diabetic ketoacidoses) ([place]) 06/30/2014  . Physical deconditioning 03/13/2013  . Anemia, iron deficiency 03/08/2013  . Bacteremia 03/06/2013  . History of Liver abscess 03/05/2013  . Encephalopathy, metabolic 38/88/2800  . Anemia 03/05/2013  . Complicated UTI (urinary tract infection) 08/15/2012  . SIRS (systemic inflammatory response syndrome) ([place]) 08/15/2012  . ARF (acute renal failure) ([place]) 08/15/2012  . Hypertension   . Urinary retention   . Stroke ([place])   . Hyperlipidemia   . Cardiomyopathy ([place])   . H/O cocaine abuse     Past Surgical History:  Procedure Laterality Date  . APPENDECTOMY    . COLONOSCOPY N/A 03/11/2013   Procedure: COLONOSCOPY;  Surgeon: Beryle Beams, MD;  Location: [facility];  Service: Endoscopy;  Laterality: N/A;  . ESOPHAGOGASTRODUODENOSCOPY N/A 03/11/2013   Procedure: ESOPHAGOGASTRODUODENOSCOPY (EGD);  Surgeon: Beryle Beams, MD;  Location:  [facility] ENDOSCOPY;  Service: Endoscopy;  Laterality: N/A;  . GIVENS CAPSULE STUDY N/A 03/12/2013   Procedure: GIVENS CAPSULE STUDY;  Surgeon: Beryle Beams, MD;  Location: [facility];  Service: Endoscopy;  Laterality: N/A;  . INGUINAL HERNIA REPAIR Right   . TIBIA FRACTURE SURGERY Left        Home Medications    Prior to Admission medications   Medication Sig Start Date End Date Taking? Authorizing Provider  acetaminophen (TYLENOL) 325 MG tablet Take 2 tablets (650 mg total) by mouth every 6 (six) hours as needed for mild pain (or Fever >/= 101). 08/21/16   Geradine Girt, DO  aspirin EC 81 MG tablet Take 81 mg by mouth daily.    Historical Provider, MD  atorvastatin (LIPITOR) 20 MG tablet TAKE ONE TABLET BY MOUTH DAILY 09/07/14   Tiffany L Reed, DO  carvedilol (COREG) 6.25 MG tablet Take 6.25 mg by mouth 2 (two) times daily with a meal.    Historical Provider, MD  clopidogrel (PLAVIX) 75 MG tablet Take 75 mg by mouth daily.    Historical Provider, MD  doxazosin (CARDURA) 4 MG tablet Take 8 mg by mouth at bedtime.     Historical Provider, MD  finasteride (PROSCAR) 5 MG tablet Take 1 tablet (5 mg total) by mouth daily. 08/22/16   Geradine Girt, DO  glucose blood (FREESTYLE LITE) test strip Use as instructed 07/31/14   Blanchie Serve, MD  glucose monitoring kit (FREESTYLE) monitoring kit 1 each by Does not apply route as needed for other. For blood sugar check 07/31/14   Blanchie Serve, MD  Lancets 28G MISC To check blood sugar three times daily with meals and at bedtime 07/31/14   Blanchie Serve, MD  levothyroxine (SYNTHROID, LEVOTHROID) 100 MCG tablet Take 100 mcg by mouth daily.    Historical Provider, MD  metFORMIN (GLUCOPHAGE) 500 MG tablet Take 500 mg by mouth 2 (two) times daily with a meal.    Historical Provider, MD    Family History Family History  Problem Relation Age of Onset  . Hypertension Mother   . Hypertension Father   . Diabetes type II Father   . Hypertension Brother      Social History Social History  Substance Use Topics  . Smoking status: Former Smoker    Packs/day: 1.00    Years: 51.00    Types: Cigarettes  . Smokeless tobacco: Never Used     Comment: 03/04/2013 "quit smoking cigarettes over 2 yr ago"  . Alcohol use No     Comment: 03/04/2013 "last alcohol was ~ 2 yr ago too"     Allergies   Patient has no known allergies.   Review of Systems Review of Systems  Constitutional: Negative for chills, diaphoresis, fatigue and fever.  Respiratory: Negative for cough, chest tightness, shortness of breath, wheezing and stridor.   Cardiovascular: Negative for chest pain and palpitations.  Gastrointestinal: Positive for abdominal pain. Negative for anorexia, constipation, diarrhea, nausea and vomiting.  Genitourinary: Positive for hematuria. Negative for penile pain.  Musculoskeletal: Negative for back pain, joint swelling and neck stiffness.  Skin: Negative for rash and wound.  Neurological: Negative for weakness, light-headedness and headaches.  Psychiatric/Behavioral: Negative for agitation and confusion.  All other systems reviewed and are negative.    Physical Exam Updated Vital Signs BP 160/87 (BP Location: Left Arm)   Pulse 96   Temp 98.1 F (36.7 C) (Oral)   Resp 18   Ht 5' 6" (1.676 m)   Wt 180 lb (81.6 kg)   SpO2 100%   BMI 29.05 kg/m   Physical Exam  Constitutional: He appears well-developed and well-nourished.  HENT:  Head: Normocephalic and atraumatic.  Eyes: Conjunctivae are normal.  Neck: Neck supple.  Cardiovascular: Tachycardia present.   No murmur heard. Pulmonary/Chest: Effort normal and breath sounds normal. No respiratory distress.  Abdominal: Soft. There is no tenderness.  Genitourinary: Right testis shows no tenderness. Left testis shows no tenderness. No penile tenderness.     Musculoskeletal: He exhibits no edema.  Neurological: He is alert. He is not disoriented. No cranial nerve deficit or sensory  deficit. He exhibits normal muscle tone. GCS eye subscore is 4. GCS verbal subscore is 5. GCS motor subscore is 6.  Skin: Skin is warm and dry.  Psychiatric: He has a normal mood and affect.  Nursing note and vitals reviewed.    ED Treatments / Results  Labs (all labs ordered are listed, but only abnormal results are displayed) Labs Reviewed  URINALYSIS, ROUTINE W REFLEX MICROSCOPIC - Abnormal; Notable for the following:       Result Value   APPearance TURBID (*)    Hgb urine dipstick MODERATE (*)    Protein, ur 100 (*)    Leukocytes, UA LARGE (*)    Bacteria, UA MANY (*)    All other components within normal limits  COMPREHENSIVE METABOLIC PANEL - Abnormal; Notable for the following:    CO2 21 (*)    Glucose, Bld 162 (*)    AST 12 (*)    ALT 9 (*)    All other components within normal limits  URINE CULTURE  CBC WITH DIFFERENTIAL/PLATELET  LIPASE, BLOOD  I-STAT CG4 LACTIC ACID, ED  I-STAT CG4 LACTIC ACID, ED    EKG  EKG Interpretation  Date/Time:  Sunday October 15 2016 07:22:01 EST Ventricular Rate:  120 PR Interval:    QRS Duration: 99 QT Interval:  314 QTC Calculation: 444 R Axis:   9 Text Interpretation:  Sinus tachycardia Borderline low voltage, extremity leads Abnormal R-wave progression, late transition Baseline wander in lead(s) V2 When compared to pfior ECG, rate is faster. No STEMI Confirmed by Sherry Ruffing MD, [place] [phone]) on 10/15/2016 9:37:30 AM       Radiology Dg Chest 2 View  Result Date: 10/15/2016 CLINICAL DATA:  Tachycardia.  Abdominal pain. EXAM: CHEST  2 VIEW COMPARISON:  08/21/2016. FINDINGS: The cardiac silhouette remains borderline enlarged. Clear lungs with normal vascularity. Mild diffuse peribronchial thickening. Unremarkable bones. IMPRESSION: Mild bronchitic changes. Electronically Signed   By: Claudie Revering M.D.   On: 10/15/2016 09:09   Ct Abdomen Pelvis W Contrast  Result Date: 10/15/2016 CLINICAL DATA:  Tachycardia, history of  urinary retention, noticed blood-stained in his underwear EXAM: CT ABDOMEN AND PELVIS WITH CONTRAST TECHNIQUE: Multidetector CT imaging of the abdomen and pelvis was performed using the standard protocol following bolus administration of intravenous contrast. CONTRAST:  135m ISOVUE-300 IOPAMIDOL (ISOVUE-300) INJECTION 61% COMPARISON:  CT scan 03/04/2013 FINDINGS: Lower chest: Small hiatal hernia.  The lung bases are normal Hepatobiliary: Enhanced liver shows no focal mass. No calcified gallstones are noted within gallbladder. Pancreas: Enhanced pancreas with normal appearance. Spleen: Enhanced  spleen is normal. Adrenals/Urinary Tract: No adrenal gland mass. Enhanced kidneys are symmetrical in size. No hydronephrosis or hydroureter. Delayed renal images shows bilateral renal symmetrical excretion. There is some cortical scarring in lower pole of the right kidney posterior aspect. There is thickening of urinary bladder wall and mild stranding of perivesical fat. Findings highly suspicious for chronic inflammation or cystitis. Again noted enlarged prostate gland with polypoid indentation of urinary bladder base. Prostate gland measures at least 6.5 by 6.2 cm. There is a Foley catheter with balloon inflated within urinary bladder. No definite bladder hematoma is identified. No nephrolithiasis. Tiny right posterior bladder diverticulum measures 1.2 cm. Stomach/Bowel: No small bowel obstruction. No gastric outlet obstruction. The patient is status post appendectomy. No pericecal inflammation. Some colonic stool noted in right colon and transverse colon. Few diverticula are noted sigmoid colon. No evidence of acute colitis or diverticulitis. No distal colonic obstruction. Vascular/Lymphatic: Atherosclerotic calcifications of abdominal aorta and iliac arteries are noted. No adenopathy. Reproductive: Enlarged prostate gland as described above. No pelvic adenopathy. No inguinal adenopathy. Other: No ascites or free abdominal  air. Musculoskeletal: No destructive bony lesions are noted. Sagittal images of the spine shows degenerative changes thoracolumbar spine. Significant disc space flattening with vacuum disc phenomenon and endplate sclerotic changes mild anterior and mild posterior spurring at L3-L4 and L5-S1 level. IMPRESSION: 1. No nephrolithiasis.  No hydronephrosis or hydroureter. 2. There is thickening of urinary bladder wall and mild stranding of fat surrounding the bladder. A tiny right posterior bladder diverticulum measures 1.2 cm. Findings are suspicious for chronic bladder inflammation or acute cystitis. Clinical correlation is necessary. 3. Again noted enlarged prostate gland with polypoid indentation of urinary bladder base. There is a Foley catheter with balloon inflated within urinary bladder. 4. No definite evidence of urinary bladder hematoma. 5. No small bowel or colonic obstruction.  Status post appendectomy. 6. No evidence of colitis or diverticulitis. 7. Atherosclerotic vascular calcifications. 8. Degenerative changes lumbar spine. Electronically Signed   By: Lahoma Crocker M.D.   On: 10/15/2016 09:41    Procedures Procedures (including critical care time)  Medications Ordered in ED Medications  sodium chloride 0.9 % bolus 1,000 mL (0 mLs Intravenous Stopped 10/15/16 0847)  HYDROmorphone (DILAUDID) injection 0.5 mg (0.5 mg Intravenous Given 10/15/16 0741)  iopamidol (ISOVUE-300) 61 % injection (100 mLs  Contrast Given 10/15/16 0909)  HYDROmorphone (DILAUDID) injection 1 mg (1 mg Intravenous Given 10/15/16 0950)  sodium chloride 0.9 % bolus 1,000 mL (0 mLs Intravenous Stopped 10/15/16 1354)  cefTRIAXone (ROCEPHIN) 1 g in dextrose 5 % 50 mL IVPB (0 g Intravenous Stopped 10/15/16 1142)     Initial Impression / Assessment and Plan / ED Course  I have reviewed the triage vital signs and the nursing notes.  Pertinent labs & imaging results that were available during my care of the patient were reviewed  by me and considered in my medical decision making (see chart for details).  Clinical Course     Brad Harrell is a 70 y.o. male with a past medical history significant for hypertension, CHF, substance abuse, hyperlipidemia, hypothyroidism, stroke, prior liver abscess, and recent urinary retention status post Foley catheter placement and UTI status post completion of antibiotic therapy who presents with decreased urine, darkened urine, severe abdominal pain, and bleeding from the meatus around the Foley catheter.  History and exam are seen above.  On exam, patient has Foley catheter placement with no significant hemorrhaging. Dried blood seen on the penis. No scrotal tenderness,  testicular tenderness, or other abnormality seen on GU exam. Foley catheter in place. Generalized abdominal tenderness. No CVA tenderness. Clear lungs. No focal neurologic deficits were appreciated.  Given currently draining Foley, patient will have urinalysis and culture sent on urine. Patient on laboratory testing to look for infection given recent UTI, current tachycardia, and severe pain. Patient will have imaging to evaluate for abscess or other abnormalities causing his discomfort. Patient will bladder scan to look for continued retention despite Foley catheter.  Patient will be given pain medicine and fluids.  EKG showed sinus tachycardia.  Patient had a bladder scan which showed approximately 400 mL of urine. CT scan showed thickened and irritated bladder wall. Imaging showed concern for cystitis. No evidence of nephrolithiasis or hydronephrosis. There was a similarly enlarged prostate gland with good placement of Foley catheter. No evidence of urinary bladder hematoma. No evidence of colitis or diverticulitis or small bowel obstruction.  Patient will have Foley catheter flushed to see if more urine can be withdrawn from bladder to help with discomfort. Urinalysis does show concern for UTI given the leukocytes  and bacteria. Moderate global seen in urine. Given imaging showing concern for UTI, urinalysis showing concern for UTI, and patient's recurrent episodes of tachycardia, suspect patient has urinary tract infection causing abdominal pain and subsequent tachycardia.  Patient will be given dose of Rocephin for UTI and fluids be continued.  After Foley was flushed and urine returned, patient had resolution of abdominal pain. Suspect infection caused poor Foley flow and subsequent pain. Patient also had improvement in heart rate after fluids. Lactic acid went from 1.09-0.86., Both within normal range.  Patient had some blood clots around the Foley catheter likely secondary to Foley catheter manipulation and irritation. This was cleaned up and bleeding improved.  After discussion with the patient and his family, it is felt that the patient will be stable for discharge. He is already scheduled follow-up this week with his urologist for Foley catheter exchange. Patient given extensive teaching on Foley catheter management including proper cleaning, proper alignment, and how to prevent discomfort. Patient will be started on antibiotics and understood strict return precautions. Patient family had no other questions or concerns and patient  was discharged in good condition with resolution of presenting pain.   Final Clinical Impressions(s) / ED Diagnoses   Final diagnoses:  Lower urinary tract infectious disease    New Prescriptions Discharge Medication List as of 10/15/2016  1:02 PM    START taking these medications   Details  levofloxacin (LEVAQUIN) 750 MG tablet Take 1 tablet (750 mg total) by mouth daily., Starting Sun 10/15/2016, Until Fri 10/20/2016, Print        Clinical Impression: 1. Lower urinary tract infectious disease     Disposition: Discharge  Condition: Good  I have discussed the results, Dx and Tx plan with the pt(& family if present). He/she/they expressed understanding and  agree(s) with the plan. Discharge instructions discussed at great length. Strict return precautions discussed and pt &/or family have verbalized understanding of the instructions. No further questions at time of discharge.    Discharge Medication List as of 10/15/2016  1:02 PM    START taking these medications   Details  levofloxacin (LEVAQUIN) 750 MG tablet Take 1 tablet (750 mg total) by mouth daily., Starting Sun 10/15/2016, Until Fri 10/20/2016, Print        Follow Up: Nolene Ebbs, MD [place] [address] [phone]     [facility]  EMERGENCY DEPARTMENT [address] [id number] [place]  If symptoms worsen     Courtney Paris, MD 10/15/16 2105

## 2016-10-15 NOTE — ED Notes (Signed)
Pt returned from X-ray.  

## 2016-10-15 NOTE — ED Notes (Signed)
This RN and Delia Chimes irrigated the pts foley catheter. Urine return was noted, urine was cloudy and sediment was present.

## 2016-10-17 ENCOUNTER — Encounter (HOSPITAL_COMMUNITY): Payer: Self-pay | Admitting: Emergency Medicine

## 2016-10-17 ENCOUNTER — Emergency Department (HOSPITAL_COMMUNITY)
Admission: EM | Admit: 2016-10-17 | Discharge: 2016-10-17 | Disposition: A | Discharge status: Home / Self Care | Payer: Medicare Other | Attending: Emergency Medicine | Admitting: Emergency Medicine

## 2016-10-17 DIAGNOSIS — Z7982 Long term (current) use of aspirin: Secondary | ICD-10-CM | POA: Diagnosis not present

## 2016-10-17 DIAGNOSIS — Z7984 Long term (current) use of oral hypoglycemic drugs: Secondary | ICD-10-CM | POA: Diagnosis not present

## 2016-10-17 DIAGNOSIS — R339 Retention of urine, unspecified: Secondary | ICD-10-CM | POA: Diagnosis present

## 2016-10-17 DIAGNOSIS — T83091A Other mechanical complication of indwelling urethral catheter, initial encounter: Secondary | ICD-10-CM

## 2016-10-17 DIAGNOSIS — I5042 Chronic combined systolic (congestive) and diastolic (congestive) heart failure: Secondary | ICD-10-CM | POA: Diagnosis not present

## 2016-10-17 DIAGNOSIS — E119 Type 2 diabetes mellitus without complications: Secondary | ICD-10-CM | POA: Diagnosis not present

## 2016-10-17 DIAGNOSIS — Z87891 Personal history of nicotine dependence: Secondary | ICD-10-CM | POA: Diagnosis not present

## 2016-10-17 DIAGNOSIS — T83098A Other mechanical complication of other indwelling urethral catheter, initial encounter: Secondary | ICD-10-CM | POA: Insufficient documentation

## 2016-10-17 DIAGNOSIS — Z7902 Long term (current) use of antithrombotics/antiplatelets: Secondary | ICD-10-CM | POA: Diagnosis not present

## 2016-10-17 DIAGNOSIS — Z8673 Personal history of transient ischemic attack (TIA), and cerebral infarction without residual deficits: Secondary | ICD-10-CM | POA: Diagnosis not present

## 2016-10-17 DIAGNOSIS — I110 Hypertensive heart disease with heart failure: Secondary | ICD-10-CM | POA: Diagnosis not present

## 2016-10-17 DIAGNOSIS — Y733 Surgical instruments, materials and gastroenterology and urology devices (including sutures) associated with adverse incidents: Secondary | ICD-10-CM | POA: Insufficient documentation

## 2016-10-17 DIAGNOSIS — E039 Hypothyroidism, unspecified: Secondary | ICD-10-CM | POA: Diagnosis not present

## 2016-10-17 LAB — URINALYSIS, ROUTINE W REFLEX MICROSCOPIC
BACTERIA UA: NONE SEEN
BILIRUBIN URINE: NEGATIVE
Glucose, UA: NEGATIVE mg/dL
Ketones, ur: NEGATIVE mg/dL
Nitrite: NEGATIVE
PROTEIN: 100 mg/dL — AB
SPECIFIC GRAVITY, URINE: 1.018 (ref 1.005–1.030)
SQUAMOUS EPITHELIAL / LPF: NONE SEEN
pH: 5 (ref 5.0–8.0)

## 2016-10-17 LAB — URINE CULTURE

## 2016-10-17 NOTE — ED Provider Notes (Signed)
[facility] DEPT Provider Note   CSN: [id number] Arrival date & time: 10/17/16  1722     History   Chief Complaint Chief Complaint  Patient presents with  . Urinary Retention    HPI Brad Harrell is a 70 y.o. male.  HPI  70 y.o. male with a hx of Urinary Retention, presents to the Emergency Department today complaining of foley obstruction. Seen on 10-15-16 for same. Irrigated in ED with relief of symptoms. Returned the same day for retention with irrigation with relief. Presents today for same. Appointment with Urology on Friday to change Foley. Noted hematuria due to previous diagnosis of cystitis based on UA and CT Scan on 10-15-16. Currently on Levaquin for UTI. Notes abdominal pain. No N/V. No fevers. No other symptoms noted.    Past Medical History:  Diagnosis Date  . Anemia    Iron Defficiency  . Borderline diabetes   . Cardiomyopathy ([place])   . 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 H/O cocaine abuse   . Hyperlipidemia   . Hypertension   . Hypothyroidism   . Liver abscess    measuring 7 x 9 cm/notes 03/04/2013  . Stroke [facility])    "weaker on my left side since" (03/04/2013)  . Urinary retention     Patient Active Problem List   Diagnosis Date Noted  . Hypothermia 08/21/2016  . Metabolic acidosis 62/26/3335  . Elevated lactic acid level 08/21/2016  . 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 Diabetes mellitus, new onset ([place]) 07/01/2014  . Left arm weakness 07/01/2014  . History of CVA  07/01/2014  . Urinary tract infection 07/01/2014  . Thrombocytopenia ([place]) 07/01/2014  . DKA (diabetic ketoacidoses) ([place]) 06/30/2014  . Physical deconditioning 03/13/2013  . Anemia, iron deficiency 03/08/2013  . Bacteremia 03/06/2013  . History of Liver abscess 03/05/2013  . Encephalopathy, metabolic 45/62/5638  . Anemia 03/05/2013  . Complicated UTI (urinary tract infection) 08/15/2012  . SIRS (systemic inflammatory  response syndrome) ([place]) 08/15/2012  . ARF (acute renal failure) ([place]) 08/15/2012  . Hypertension   . Urinary retention   . Stroke ([place])   . Hyperlipidemia   . Cardiomyopathy ([place])   . H/O cocaine abuse     Past Surgical History:  Procedure Laterality Date  . APPENDECTOMY    . COLONOSCOPY N/A 03/11/2013   Procedure: COLONOSCOPY;  Surgeon: Beryle Beams, MD;  Location: [facility];  Service: Endoscopy;  Laterality: N/A;  . ESOPHAGOGASTRODUODENOSCOPY N/A 03/11/2013   Procedure: ESOPHAGOGASTRODUODENOSCOPY (EGD);  Surgeon: Beryle Beams, MD;  Location: [facility] ENDOSCOPY;  Service: Endoscopy;  Laterality: N/A;  . GIVENS CAPSULE STUDY N/A 03/12/2013   Procedure: GIVENS CAPSULE STUDY;  Surgeon: Beryle Beams, MD;  Location: [facility];  Service: Endoscopy;  Laterality: N/A;  . INGUINAL HERNIA REPAIR Right   . TIBIA FRACTURE SURGERY Left        Home Medications    Prior to Admission medications   Medication Sig Start Date End Date Taking? Authorizing Provider  acetaminophen (TYLENOL) 325 MG tablet Take 2 tablets (650 mg total) by mouth every 6 (six) hours as needed for mild pain (or Fever >/= 101). 08/21/16   Geradine Girt, DO  aspirin EC 81 MG tablet Take 81 mg by mouth daily.    Historical Provider, MD  atorvastatin (LIPITOR) 20 MG tablet TAKE ONE TABLET BY MOUTH DAILY Patient taking differently: TAKE 20 MG BY MOUTH DAILY 09/07/14   Gayland Curry,  DO  carvedilol (COREG) 6.25 MG tablet Take 6.25 mg by mouth 2 (two) times daily with a meal.    Historical Provider, MD  clopidogrel (PLAVIX) 75 MG tablet Take 75 mg by mouth daily.    Historical Provider, MD  doxazosin (CARDURA) 8 MG tablet Take 8 mg by mouth at bedtime.     Historical Provider, MD  finasteride (PROSCAR) 5 MG tablet Take 1 tablet (5 mg total) by mouth daily. Patient not taking: Reported on 10/15/2016 08/22/16   Geradine Girt, DO  glucose blood (FREESTYLE LITE) test strip Use as instructed Patient not taking: Reported on  10/15/2016 07/31/14   Blanchie Serve, MD  glucose monitoring kit (FREESTYLE) monitoring kit 1 each by Does not apply route as needed for other. For blood sugar check Patient not taking: Reported on 10/15/2016 07/31/14   Blanchie Serve, MD  Lancets 28G MISC To check blood sugar three times daily with meals and at bedtime Patient not taking: Reported on 10/15/2016 07/31/14   Blanchie Serve, MD  levofloxacin (LEVAQUIN) 750 MG tablet Take 1 tablet (750 mg total) by mouth daily. 10/15/16 10/20/16  Gwenyth Allegra Tegeler, MD  levothyroxine (SYNTHROID, LEVOTHROID) 100 MCG tablet Take 100 mcg by mouth daily.    Historical Provider, MD  metFORMIN (GLUCOPHAGE) 500 MG tablet Take 500 mg by mouth 2 (two) times daily with a meal.    Historical Provider, MD    Family History Family History  Problem Relation Age of Onset  . Hypertension Mother   . Hypertension Father   . Diabetes type II Father   . Hypertension Brother     Social History Social History  Substance Use Topics  . Smoking status: Former Smoker    Packs/day: 1.00    Years: 51.00    Types: Cigarettes  . Smokeless tobacco: Never Used     Comment: 03/04/2013 "quit smoking cigarettes over 2 yr ago"  . Alcohol use No     Comment: 03/04/2013 "last alcohol was ~ 2 yr ago too"     Allergies   Patient has no known allergies.   Review of Systems Review of Systems ROS reviewed and all are negative for acute change except as noted in the HPI.  Physical Exam Updated Vital Signs BP 150/78 (BP Location: Left Arm)   Pulse 87   Temp 98.3 F (36.8 C) (Oral)   Resp 18   SpO2 99%   Physical Exam  Constitutional: He is oriented to person, place, and time. Vital signs are normal. He appears well-developed and well-nourished.  HENT:  Head: Normocephalic and atraumatic.  Right Ear: Hearing normal.  Left Ear: Hearing normal.  Eyes: Conjunctivae and EOM are normal. Pupils are equal, round, and reactive to light.  Neck: Normal range of motion. Neck  supple.  Cardiovascular: Normal rate, regular rhythm, normal heart sounds and intact distal pulses.   Pulmonary/Chest: Effort normal and breath sounds normal.  Abdominal: Soft. Normal appearance and bowel sounds are normal. There is tenderness in the suprapubic area.  Genitourinary:  Genitourinary Comments: Chaperone present. Foley present. Blood noted around meatus. No erythema. No swelling.   Neurological: He is alert and oriented to person, place, and time.  Skin: Skin is warm and dry.  Psychiatric: He has a normal mood and affect. His speech is normal and behavior is normal. Thought content normal.  Nursing note and vitals reviewed.  ED Treatments / Results  Labs (all labs ordered are listed, but only abnormal results are displayed) Labs Reviewed  URINE CULTURE  URINALYSIS, ROUTINE W REFLEX MICROSCOPIC   EKG  EKG Interpretation None       Radiology No results found.  Procedures Procedures (including critical care time)  Medications Ordered in ED Medications - No data to display   Initial Impression / Assessment and Plan / ED Course  I have reviewed the triage vital signs and the nursing notes.  Pertinent labs & imaging results that were available during my care of the patient were reviewed by me and considered in my medical decision making (see chart for details).  Clinical Course    Final Clinical Impressions(s) / ED Diagnoses  {I have reviewed and evaluated the relevant laboratory values.   {I have reviewed the relevant previous healthcare records.  {I obtained HPI from historian. {Patient discussed with supervising physician.  ED Course:  Assessment: Pt is a 70yM with hx Urinary retention who presents with obstructed foley. Has chronic foley. Changes every month. Appointment with Urology on Friday. Seen in ED on 10-15-16 for same. CT and UA showed Cystitis. Currently on Levaquin for UTI. No N/V. No fevers. On exam, pt in NAD. Nontoxic/nonseptic appearing. VSS.  Afebrile. Lungs CTA. Heart RRR. Abdomen TTP suprapubic. UA and culture obtained. Culture reviewed from previous with sensitivity to Levaquin. Changed Foley in ED with relief of symptoms. Noted that previous foley had urine around foley as well as mild blood clots. Plan is to DC home with follow up to Urology. At time of discharge, Patient is in no acute distress. Vital Signs are stable. Patient is able to ambulate. Patient able to tolerate PO.   Disposition/Plan:  DC Home Additional Verbal discharge instructions given and discussed with patient.  Pt Instructed to f/u with Urology in the next week for evaluation and treatment of symptoms. Return precautions given Pt acknowledges and agrees with plan  Supervising Physician Leo Grosser, MD  Final diagnoses:  Obstruction of Foley catheter, initial encounter [facility])    New Prescriptions New Prescriptions   No medications on file     Shary Decamp, PA-C 10/17/16 2132    Leo Grosser, MD 10/18/16 0140

## 2016-10-17 NOTE — Discharge Instructions (Signed)
Please read and follow all provided instructions.  Your diagnoses today include:  1. Obstruction of Foley catheter, initial encounter [facility])     Tests performed today include: Vital signs. See below for your results today.   Medications prescribed:  Take as prescribed   Home care instructions:  Follow any educational materials contained in this packet.  Follow-up instructions: Please follow-up with your Urologist for further evaluation of symptoms and treatment   Return instructions:  Please return to the Emergency Department if you do not get better, if you get worse, or new symptoms OR  - Fever (temperature greater than 101.37F)  - Bleeding that does not stop with holding pressure to the area    -Severe pain (please note that you may be more sore the day after your accident)  - Chest Pain  - Difficulty breathing  - Severe nausea or vomiting  - Inability to tolerate food and liquids  - Passing out  - Skin becoming red around your wounds  - Change in mental status (confusion or lethargy)  - New numbness or weakness    Please return if you have any other emergent concerns.  Additional Information:  Your vital signs today were: BP 150/78 (BP Location: Left Arm)    Pulse 87    Temp 98.3 F (36.8 C) (Oral)    Resp 18    SpO2 99%  If your blood pressure (BP) was elevated above 135/85 this visit, please have this repeated by your doctor within one month. ---------------

## 2016-10-17 NOTE — ED Notes (Signed)
PA at bedside.

## 2016-10-17 NOTE — ED Notes (Signed)
Pt awaiting 2 certified staff for urinal insertion

## 2016-10-17 NOTE — ED Triage Notes (Signed)
Pt here with  Foley in place but sts no urine output from foley all day; pt sts some leaking around tube and some bleeding at the tip of penis

## 2016-10-18 ENCOUNTER — Telehealth (HOSPITAL_BASED_OUTPATIENT_CLINIC_OR_DEPARTMENT_OTHER): Payer: Self-pay

## 2016-10-18 NOTE — Telephone Encounter (Signed)
Post ED Visit - Positive Culture Follow-up  Culture report reviewed by antimicrobial stewardship pharmacist:  []  Elenor Quinones, Pharm.D. []  Heide Guile, Pharm.D., BCPS [x]  Parks Neptune, Pharm.D. []  Alycia Rossetti, Pharm.D., BCPS []  [place], [place].D., BCPS, AAHIVP []  Legrand Como, Pharm.D., BCPS, AAHIVP []  Milus Glazier, Pharm.D. []  Stephens November, Pharm.D.  Positive urine culture -> >/= 100,000 colonies -> Enterococcus Faecalis Treated with Levofloxacin, organism sensitive to the same and no further patient follow-up is required at this time.  Dortha Kern 10/18/2016, 9:31 AM

## 2016-10-19 LAB — URINE CULTURE

## 2016-12-05 ENCOUNTER — Other Ambulatory Visit: Payer: Self-pay | Admitting: Urology

## 2016-12-25 ENCOUNTER — Other Ambulatory Visit (HOSPITAL_COMMUNITY): Payer: Self-pay | Admitting: *Deleted

## 2016-12-25 NOTE — Progress Notes (Signed)
Chest xray 10-15-16 epic ekg 10-15-16 epic

## 2016-12-25 NOTE — Patient Instructions (Addendum)
ZADEN NASCA  12/25/2016   Your procedure is scheduled on: 01-04-17  Report to [facility] Main  Entrance take [facility]  elevators to 3rd floor to  [place] at 845 AM.  Call this number if you have problems the morning of surgery [phone]   Remember: ONLY 1 PERSON MAY GO WITH YOU TO SHORT STAY TO GET  READY MORNING OF [place].  Do not eat food or drink liquids :After Midnight.     Take these medicines the morning of surgery with A SIP OF WATER: CARVEDILIOL(COREG) LEVOTHYROXINE (SYNTHROID) DO NOT TAKE ANY DIABETIC MEDICATIONS DAY OF YOUR SURGERY                               You may not have any metal on your body including hair pins and              piercings  Do not wear jewelry, make-up, lotions, powders or perfumes, deodorant             Do not wear nail polish.  Do not shave  48 hours prior to surgery.              Men may shave face and neck.   Do not bring valuables to the hospital. [place].  Contacts, dentures or bridgework may not be worn into surgery.  Leave suitcase in the car. After surgery it may be brought to your room.     Patients discharged the day of surgery will not be allowed to drive home.  Name and phone number of your driver: MANSFIELD [place]GROVE CELL [phone]  Special Instructions: N/A              Please read over the following fact sheets you were given: _____________________________________________________________________             How to Manage Your Diabetes Before and After Surgery  Why is it important to control my blood sugar before and after surgery? . Improving blood sugar levels before and after surgery helps healing and can limit problems. . A way of improving blood sugar control is eating a healthy diet by: o  Eating less sugar and carbohydrates o  Increasing activity/exercise o  Talking with your doctor about reaching your blood sugar  goals . High blood sugars (greater than 180 mg/dL) can raise your risk of infections and slow your recovery, so you will need to focus on controlling your diabetes during the weeks before surgery. . Make sure that the doctor who takes care of your diabetes knows about your planned surgery including the date and location.  How do I manage my blood sugar before surgery? . Check your blood sugar at least 4 times a day, starting 2 days before surgery, to make sure that the level is not too high or low. o Check your blood sugar the morning of your surgery when you wake up and every 2 hours until you get to the Short Stay unit. . If your blood sugar is less than 70 mg/dL, you will need to treat for low blood sugar: o Do not take insulin. o Treat a low blood sugar (less than 70 mg/dL) with  cup of clear juice (cranberry  or apple), 4 glucose tablets, OR glucose gel. o Recheck blood sugar in 15 minutes after treatment (to make sure it is greater than 70 mg/dL). If your blood sugar is not greater than 70 mg/dL on recheck, call [phone] for further instructions. . Report your blood sugar to the short stay nurse when you get to Short Stay.  . If you are admitted to the hospital after surgery: o Your blood sugar will be checked by the staff and you will probably be given insulin after surgery (instead of oral diabetes medicines) to make sure you have good blood sugar levels. o The goal for blood sugar control after surgery is 80-180 mg/dL.   WHAT DO I DO ABOUT MY DIABETES MEDICATION? Take your metformin as usual day before surgery 01-03-17 Do not take metformin day of surgery 01-04-17   Patient Signature:  Date:   Nurse Signature:  Date:   Reviewed and Endorsed by [facility] Patient Education Committee, August 2015[facility] - Preparing for Surgery Before surgery, you can play an important role.  Because skin is not sterile, your skin needs to be as free of germs as possible.  You can reduce the  number of germs on your skin by washing with CHG (chlorahexidine gluconate) soap before surgery.  CHG is an antiseptic cleaner which kills germs and bonds with the skin to continue killing germs even after washing. Please DO NOT use if you have an allergy to CHG or antibacterial soaps.  If your skin becomes reddened/irritated stop using the CHG and inform your nurse when you arrive at Short Stay. Do not shave (including legs and underarms) for at least 48 hours prior to the first CHG shower.  You may shave your face/neck. Please follow these instructions carefully:  1.  Shower with CHG Soap the night before surgery and the  morning of Surgery.  2.  If you choose to wash your hair, wash your hair first as usual with your  normal  shampoo.  3.  After you shampoo, rinse your hair and body thoroughly to remove the  shampoo.                           4.  Use CHG as you would any other liquid soap.  You can apply chg directly  to the skin and wash                       Gently with a scrungie or clean washcloth.  5.  Apply the CHG Soap to your body ONLY FROM THE NECK DOWN.   Do not use on face/ open                           Wound or open sores. Avoid contact with eyes, ears mouth and genitals (private parts).                       Wash face,  Genitals (private parts) with your normal soap.             6.  Wash thoroughly, paying special attention to the area where your surgery  will be performed.  7.  Thoroughly rinse your body with warm water from the neck down.  8.  DO NOT shower/wash with your normal soap after using and rinsing off  the CHG Soap.  9.  Pat yourself dry with a clean towel.            10.  Wear clean pajamas.            11.  Place clean sheets on your bed the night of your first shower and do not  sleep with pets. Day of Surgery : Do not apply any lotions/deodorants the morning of surgery.  Please wear clean clothes to the hospital/surgery center.

## 2016-12-26 ENCOUNTER — Encounter (HOSPITAL_COMMUNITY): Payer: Self-pay

## 2016-12-26 ENCOUNTER — Encounter (HOSPITAL_COMMUNITY)
Admission: RE | Admit: 2016-12-26 | Discharge: 2016-12-26 | Disposition: A | Discharge status: Home / Self Care | Payer: Medicare Other | Source: Ambulatory Visit | Attending: Urology | Admitting: Urology

## 2016-12-26 DIAGNOSIS — I110 Hypertensive heart disease with heart failure: Secondary | ICD-10-CM | POA: Diagnosis not present

## 2016-12-26 DIAGNOSIS — Z8673 Personal history of transient ischemic attack (TIA), and cerebral infarction without residual deficits: Secondary | ICD-10-CM | POA: Insufficient documentation

## 2016-12-26 DIAGNOSIS — N400 Enlarged prostate without lower urinary tract symptoms: Secondary | ICD-10-CM | POA: Diagnosis not present

## 2016-12-26 DIAGNOSIS — Z01812 Encounter for preprocedural laboratory examination: Secondary | ICD-10-CM | POA: Diagnosis not present

## 2016-12-26 DIAGNOSIS — D509 Iron deficiency anemia, unspecified: Secondary | ICD-10-CM | POA: Diagnosis not present

## 2016-12-26 DIAGNOSIS — I5040 Unspecified combined systolic (congestive) and diastolic (congestive) heart failure: Secondary | ICD-10-CM | POA: Insufficient documentation

## 2016-12-26 DIAGNOSIS — E119 Type 2 diabetes mellitus without complications: Secondary | ICD-10-CM | POA: Diagnosis not present

## 2016-12-26 DIAGNOSIS — E785 Hyperlipidemia, unspecified: Secondary | ICD-10-CM | POA: Diagnosis not present

## 2016-12-26 HISTORY — DX: Presence of urogenital implants: Z96.0

## 2016-12-26 HISTORY — DX: Presence of other specified devices: Z97.8

## 2016-12-26 HISTORY — DX: Type 2 diabetes mellitus without complications: E11.9

## 2016-12-26 LAB — GLUCOSE, CAPILLARY: Glucose-Capillary: 171 mg/dL — ABNORMAL HIGH (ref 65–99)

## 2016-12-26 LAB — SURGICAL PCR SCREEN
MRSA, PCR: NEGATIVE
STAPHYLOCOCCUS AUREUS: NEGATIVE

## 2017-01-02 NOTE — Progress Notes (Signed)
Medical clearance letter dr Jeanie Cooks on chart Following labs received after reuqest x 5 times : cmet, cbc with dif, lipid panel, tsh, free t3 4, no hemaglobin A1C received lov dr Jeanie Cooks 12-20-16 on chaer ekg 12-20-16 dr Jeanie Cooks on chart

## 2017-01-04 ENCOUNTER — Ambulatory Visit (HOSPITAL_COMMUNITY)
Admission: RE | Admit: 2017-01-04 | Discharge: 2017-01-04 | Disposition: A | Discharge status: Home / Self Care | Payer: Medicare Other | Source: Ambulatory Visit | Attending: Urology | Admitting: Urology

## 2017-01-04 ENCOUNTER — Ambulatory Visit (HOSPITAL_COMMUNITY): Payer: Medicare Other | Admitting: Anesthesiology

## 2017-01-04 ENCOUNTER — Encounter (HOSPITAL_COMMUNITY): Payer: Self-pay | Admitting: *Deleted

## 2017-01-04 ENCOUNTER — Encounter (HOSPITAL_COMMUNITY)
Admission: RE | Disposition: A | Discharge status: Home / Self Care | Payer: Self-pay | Source: Ambulatory Visit | Attending: Urology

## 2017-01-04 DIAGNOSIS — I5042 Chronic combined systolic (congestive) and diastolic (congestive) heart failure: Secondary | ICD-10-CM | POA: Diagnosis not present

## 2017-01-04 DIAGNOSIS — R338 Other retention of urine: Secondary | ICD-10-CM | POA: Insufficient documentation

## 2017-01-04 DIAGNOSIS — I110 Hypertensive heart disease with heart failure: Secondary | ICD-10-CM | POA: Diagnosis not present

## 2017-01-04 DIAGNOSIS — E119 Type 2 diabetes mellitus without complications: Secondary | ICD-10-CM | POA: Diagnosis not present

## 2017-01-04 DIAGNOSIS — Z7902 Long term (current) use of antithrombotics/antiplatelets: Secondary | ICD-10-CM | POA: Insufficient documentation

## 2017-01-04 DIAGNOSIS — E039 Hypothyroidism, unspecified: Secondary | ICD-10-CM | POA: Insufficient documentation

## 2017-01-04 DIAGNOSIS — N401 Enlarged prostate with lower urinary tract symptoms: Secondary | ICD-10-CM | POA: Diagnosis present

## 2017-01-04 DIAGNOSIS — Z7982 Long term (current) use of aspirin: Secondary | ICD-10-CM | POA: Diagnosis not present

## 2017-01-04 DIAGNOSIS — I429 Cardiomyopathy, unspecified: Secondary | ICD-10-CM | POA: Diagnosis not present

## 2017-01-04 DIAGNOSIS — I69351 Hemiplegia and hemiparesis following cerebral infarction affecting right dominant side: Secondary | ICD-10-CM | POA: Diagnosis not present

## 2017-01-04 DIAGNOSIS — E785 Hyperlipidemia, unspecified: Secondary | ICD-10-CM | POA: Diagnosis not present

## 2017-01-04 DIAGNOSIS — I69354 Hemiplegia and hemiparesis following cerebral infarction affecting left non-dominant side: Secondary | ICD-10-CM | POA: Diagnosis not present

## 2017-01-04 DIAGNOSIS — R339 Retention of urine, unspecified: Secondary | ICD-10-CM

## 2017-01-04 DIAGNOSIS — N138 Other obstructive and reflux uropathy: Secondary | ICD-10-CM | POA: Insufficient documentation

## 2017-01-04 DIAGNOSIS — Z79899 Other long term (current) drug therapy: Secondary | ICD-10-CM | POA: Diagnosis not present

## 2017-01-04 DIAGNOSIS — Z87891 Personal history of nicotine dependence: Secondary | ICD-10-CM | POA: Insufficient documentation

## 2017-01-04 HISTORY — PX: CYSTOSCOPY WITH INSERTION OF UROLIFT: SHX6678

## 2017-01-04 LAB — GLUCOSE, CAPILLARY: GLUCOSE-CAPILLARY: 119 mg/dL — AB (ref 65–99)

## 2017-01-04 SURGERY — CYSTOSCOPY WITH INSERTION OF UROLIFT
Anesthesia: General

## 2017-01-04 MED ORDER — PROMETHAZINE HCL 25 MG/ML IJ SOLN: 6.2500 mg | INTRAMUSCULAR | Status: DC | PRN

## 2017-01-04 MED ORDER — PIPERACILLIN-TAZOBACTAM 3.375 G IVPB 30 MIN: 3.3750 g | INTRAVENOUS | Status: DC

## 2017-01-04 MED ORDER — LIDOCAINE HCL (CARDIAC) 20 MG/ML IV SOLN
INTRAVENOUS | Status: DC | PRN
  Administered 2017-01-04: 40 mg via INTRAVENOUS

## 2017-01-04 MED ORDER — ONDANSETRON HCL 4 MG/2ML IJ SOLN
INTRAMUSCULAR | Status: DC | PRN
  Administered 2017-01-04: 4 mg via INTRAVENOUS

## 2017-01-04 MED ORDER — SODIUM CHLORIDE 0.9 % IR SOLN
Status: DC | PRN
  Administered 2017-01-04: 3000 mL

## 2017-01-04 MED ORDER — PROPOFOL 10 MG/ML IV BOLUS
INTRAVENOUS | Status: DC | PRN
  Administered 2017-01-04: 80 mg via INTRAVENOUS
  Administered 2017-01-04: 20 mg via INTRAVENOUS

## 2017-01-04 MED ORDER — PIPERACILLIN-TAZOBACTAM 3.375 G IVPB 30 MIN
3.3750 g | INTRAVENOUS | Status: DC
  Filled 2017-01-04: qty 50

## 2017-01-04 MED ORDER — LIDOCAINE 2% (20 MG/ML) 5 ML SYRINGE
INTRAMUSCULAR | Status: AC
  Filled 2017-01-04: qty 5

## 2017-01-04 MED ORDER — FENTANYL CITRATE (PF) 100 MCG/2ML IJ SOLN
INTRAMUSCULAR | Status: DC | PRN
  Administered 2017-01-04: 50 ug via INTRAVENOUS

## 2017-01-04 MED ORDER — HYDROMORPHONE HCL 1 MG/ML IJ SOLN: 0.2500 mg | INTRAMUSCULAR | Status: DC | PRN

## 2017-01-04 MED ORDER — PIPERACILLIN-TAZOBACTAM 3.375 G IVPB
3.3750 g | INTRAVENOUS | Status: DC
Start: 2017-01-04 — End: 2017-01-04

## 2017-01-04 MED ORDER — TRAMADOL HCL 50 MG PO TABS: 50.0000 mg | ORAL_TABLET | Freq: Four times a day (QID) | ORAL | 0 refills | Status: DC | PRN

## 2017-01-04 MED ORDER — MEPERIDINE HCL 50 MG/ML IJ SOLN: 6.2500 mg | INTRAMUSCULAR | Status: DC | PRN

## 2017-01-04 MED ORDER — LACTATED RINGERS IV SOLN
INTRAVENOUS | Status: DC
  Administered 2017-01-04: 09:00:00 via INTRAVENOUS

## 2017-01-04 MED ORDER — FENTANYL CITRATE (PF) 100 MCG/2ML IJ SOLN
INTRAMUSCULAR | Status: AC
  Filled 2017-01-04: qty 2

## 2017-01-04 MED ORDER — ONDANSETRON HCL 4 MG/2ML IJ SOLN
INTRAMUSCULAR | Status: AC
  Filled 2017-01-04: qty 2

## 2017-01-04 MED ORDER — PROPOFOL 10 MG/ML IV BOLUS
INTRAVENOUS | Status: AC
  Filled 2017-01-04: qty 20

## 2017-01-04 MED ORDER — PIPERACILLIN SOD-TAZOBACTAM SO 3.375 (3-0.375) G IV SOLR
3.3750 g | INTRAVENOUS | Status: AC
Start: 2017-01-04 — End: 2017-01-04
  Administered 2017-01-04: 3.375 g via INTRAVENOUS
  Filled 2017-01-04: qty 3.38

## 2017-01-04 SURGICAL SUPPLY — 11 items
BAG URINE DRAINAGE (UROLOGICAL SUPPLIES) ×3 IMPLANT
BAG URO CATCHER STRL LF (MISCELLANEOUS) ×3 IMPLANT
CATH FOLEY 2WAY SLVR  5CC 16FR (CATHETERS) ×2
CATH FOLEY 2WAY SLVR 5CC 16FR (CATHETERS) ×1 IMPLANT
GLOVE BIO SURGEON STRL SZ8 (GLOVE) ×3 IMPLANT
GOWN STRL REUS W/TWL XL LVL3 (GOWN DISPOSABLE) ×6 IMPLANT
MANIFOLD NEPTUNE II (INSTRUMENTS) ×3 IMPLANT
PACK CYSTO (CUSTOM PROCEDURE TRAY) ×3 IMPLANT
SYSTEM UROLIFT (Male Continence) ×24 IMPLANT
TUBING CONNECTING 10 (TUBING) ×2 IMPLANT
TUBING CONNECTING 10' (TUBING) ×1

## 2017-01-04 NOTE — Anesthesia Procedure Notes (Signed)
Procedure Name: LMA Insertion Date/Time: 01/04/2017 11:00 AM Performed by: Glory Buff Pre-anesthesia Checklist: Patient identified, Emergency Drugs available, Suction available and Patient being monitored Patient Re-evaluated:Patient Re-evaluated prior to inductionOxygen Delivery Method: Circle system utilized Preoxygenation: Pre-oxygenation with 100% oxygen Intubation Type: IV induction LMA: LMA inserted LMA Size: 4.0 Number of attempts: 1 Placement Confirmation: positive ETCO2 Tube secured with: Tape Dental Injury: Teeth and Oropharynx as per pre-operative assessment

## 2017-01-04 NOTE — Transfer of Care (Signed)
Immediate Anesthesia Transfer of Care Note  Patient: Brad Harrell  Procedure(s) Performed: Procedure(s): CYSTOSCOPY WITH INSERTION OF UROLIFT x8 (N/A)  Patient Location: PACU  Anesthesia Type:General  Level of Consciousness: awake, alert  and oriented  Airway & Oxygen Therapy: Patient Spontanous Breathing and Patient connected to face mask oxygen  Post-op Assessment: Report given to RN and Post -op Vital signs reviewed and stable  Post vital signs: Reviewed and stable  Last Vitals:  Vitals:   01/04/17 0845  BP: (!) 147/98  Pulse: (!) 102  Resp: 18  Temp: 36.8 C    Last Pain:  Vitals:   01/04/17 0845  TempSrc: Oral         Complications: No apparent anesthesia complications

## 2017-01-04 NOTE — Discharge Instructions (Signed)
Indwelling Urinary Catheter Care, Adult °Take good care of your catheter to keep it working and to prevent problems. °How to wear your catheter °Attach your catheter to your leg with tape (adhesive tape) or a leg strap. Make sure it is not too tight. If you use tape, remove any bits of tape that are already on the catheter. °How to wear a drainage bag °You should have: °· A large overnight bag. °· A small leg bag. °Overnight Bag  °You may wear the overnight bag at any time. Always keep the bag below the level of your bladder but off the floor. When you sleep, put a clean plastic bag in a wastebasket. Then hang the bag inside the wastebasket. °Leg Bag  °Never wear the leg bag at night. Always wear the leg bag below your knee. Keep the leg bag secure with a leg strap or tape. °How to care for your skin °· Clean the skin around the catheter at least once every day. °· Shower every day. Do not take baths. °· Put creams, lotions, or ointments on your genital area only as told by your doctor. °· Do not use powders, sprays, or lotions on your genital area. °How to clean your catheter and your skin °1. Wash your hands with soap and water. °2. Wet a washcloth in warm water and gentle (mild) soap. °3. Use the washcloth to clean the skin where the catheter enters your body. Clean downward and wipe away from the catheter in small circles. Do not wipe toward the catheter. °4. Pat the area dry with a clean towel. Make sure to clean off all soap. °How to care for your drainage bags °Empty your drainage bag when it is ?-½ full or at least 2-3 times a day. Replace your drainage bag once a month or sooner if it starts to smell bad or look dirty. Do not clean your drainage bag unless told by your doctor. °Emptying a drainage bag  ° °Supplies Needed °· Rubbing alcohol. °· Gauze pad or cotton ball. °· Tape or a leg strap. °Steps °1. Wash your hands with soap and water. °2. Separate (detach) the bag from your leg. °3. Hold the bag over  the toilet or a clean container. Keep the bag below your hips and bladder. This stops pee (urine) from going back into the tube. °4. Open the pour spout at the bottom of the bag. °5. Empty the pee into the toilet or container. Do not let the pour spout touch any surface. °6. Put rubbing alcohol on a gauze pad or cotton ball. °7. Use the gauze pad or cotton ball to clean the pour spout. °8. Close the pour spout. °9. Attach the bag to your leg with tape or a leg strap. °10. Wash your hands. °Changing a drainage bag  °Supplies Needed °· Alcohol wipes. °· A clean drainage bag. °· Adhesive tape or a leg strap. °Steps °1. Wash your hands with soap and water. °2. Separate the dirty bag from your leg. °3. Pinch the rubber catheter with your fingers so that pee does not spill out. °4. Separate the catheter tube from the drainage tube where these tubes connect (at the connection valve). Do not let the tubes touch any surface. °5. Clean the end of the catheter tube with an alcohol wipe. Use a different alcohol wipe to clean the end of the drainage tube. °6. Connect the catheter tube to the drainage tube of the clean bag. °7. Attach the new bag to   the leg with adhesive tape or a leg strap. °8. Wash your hands. °How to prevent infection and other problems °· Never pull on your catheter or try to remove it. Pulling can damage tissue in your body. °· Always wash your hands before and after touching your catheter. °· If a leg strap gets wet, replace it with a dry one. °· Drink enough fluids to keep your pee clear or pale yellow, or as told by your doctor. °· Do not let the drainage bag or tubing touch the floor. °· Wear cotton underwear. °· If you are male, wipe from front to back after you poop (have a bowel movement). °· Check on the catheter often to make sure it works and the tubing is not twisted. °Get help if: °· Your pee is cloudy. °· Your pee smells unusually bad. °· Your pee is not draining into the bag. °· Your tube  gets clogged. °· Your catheter starts to leak. °· Your bladder feels full. °Get help right away if: °· You have redness, swelling, or pain where the catheter enters your body. °· You have fluid, pus, or a bad smell coming from the area where the catheter enters your body. °· The area where the catheter enters your body feels warm. °· You have a fever. °· You have pain in your: °¨ Stomach (abdomen). °¨ Legs. °¨ Lower back. °¨ Bladder. °· You see blood fill the catheter. °· Your pee is pink or red. °· You feel sick to your stomach (nauseous). °· You throw up (vomit). °· You have chills. °· Your catheter gets pulled out. °This information is not intended to replace advice given to you by your health care provider. Make sure you discuss any questions you have with your health care provider. °Document Released: 02/10/2013 Document Revised: 09/13/2016 Document Reviewed: 03/31/2014 °[employer] Interactive Patient Education © 2017 [employer] ° °

## 2017-01-04 NOTE — Anesthesia Preprocedure Evaluation (Addendum)
Anesthesia Evaluation  Patient identified by MRN, date of birth, ID band Patient awake    Reviewed: Allergy & Precautions, NPO status , Patient's Chart, lab work & pertinent test results, reviewed documented beta blocker date and time   Airway Mallampati: II  TM Distance: >3 FB Neck ROM: Full    Dental  (+) Poor Dentition, Missing, Chipped   Pulmonary neg pulmonary ROS, former smoker,    Pulmonary exam normal breath sounds clear to auscultation       Cardiovascular hypertension, Pt. on home beta blockers and Pt. on medications Normal cardiovascular exam Rhythm:Regular Rate:Normal  Echo 2015 - Left ventricle: The cavity size was normal. Wall thickness wasnormal. Systolic function was moderately to severely reduced. Theestimated ejection fraction was in the range of 30% to 35%.Diffuse hypokinesis. Doppler parameters are consistent withabnormal left ventricular relaxation (grade 1 diastolicdysfunction). - Right atrium: The atrium was mildly dilated.   Neuro/Psych CVA, Residual Symptoms negative psych ROS   GI/Hepatic negative GI ROS, Neg liver ROS,   Endo/Other  diabetes, Type 2Hypothyroidism   Renal/GU Renal disease     Musculoskeletal negative musculoskeletal ROS (+)   Abdominal   Peds  Hematology  (+) anemia ,   Anesthesia Other Findings   Reproductive/Obstetrics negative OB ROS                            Anesthesia Physical Anesthesia Plan  ASA: III  Anesthesia Plan: General   Post-op Pain Management:    Induction: Intravenous  Airway Management Planned: LMA  Additional Equipment:   Intra-op Plan:   Post-operative Plan: Extubation in OR  Informed Consent: I have reviewed the patients History and Physical, chart, labs and discussed the procedure including the risks, benefits and alternatives for the proposed anesthesia with the patient or authorized representative who  has indicated his/her understanding and acceptance.   Dental advisory given  Plan Discussed with: CRNA  Anesthesia Plan Comments:         Anesthesia Quick Evaluation

## 2017-01-04 NOTE — Op Note (Signed)
   PREOPERATIVE DIAGNOSIS: Benign prostatic hypertrophy with bladder outlet obstruction.  POSTOPERATIVE DIAGNOSIS: Benign prostatic hypertrophy with bladder outlet obstruction.  PROCEDURE: Cystoscopy with implantation of UroLift devices, 8 implants.  SURGEON: Nicolette Bang, M.D.  ANESTHESIA: General  ANTIBIOTICS: zosyn  SPECIMEN: None.  DRAINS: A 16-French Foley catheter.  BLOOD LOSS: Minimal.  COMPLICATIONS: None.  INDICATIONS:The Patient is an 71 year old white male with BPH and Urinary retention. He has failed medical therapy and has elected UroLift for definitive treatment.  FINDINGS OF PROCEDURE: He was taken to the operating room where a genral anesthetic was induced. He was placed in lithotomy position and was fitted with PAS hose. His perineum and genitalia were prepped with chlorhexidine, and he was draped in usual sterile fashion.  Cystoscopy was performed using the UroLift scope and 0 degree lens. Examination revealed a normal urethra. The external sphincter was intact. Prostatic urethra was approximately 6 cm in length with lateral lobe enlargement. There was also little bit of bladder neck elevation. Inspection of bladder revealed mild-to-moderate trabeculation with no tumors, stones, or inflammation. No cellules or diverticula were noted. Ureteral orifices were in their normal anatomic position effluxing clear urine.  After initial cystoscopy, the visual obturator was replaced with the first UroLift device. This was turned to the 9 o'clock position and pulled back to the veru and then slightly advanced. Pressure was then applied to the right lateral lobe and the UroLift device was deployed.  The second UroLift device was then inserted and applied to the left lateral lobe at 3 o'clock and deployed in the mid prostatic urethra. After this, there was still some apparent obstruction closer to the bladder neck. So a second level of  UroLift your left device was applied between the mid urethra and the proximal urethra providing further patency to the prostatic urethra. At this point, there was mild bleeding but the patient did have a spinal anesthetic. So it was thought that a Foley catheter was indicated. The scope was removed and a 16-French Foley catheter was inserted without difficulty. The balloon was filled with 10 mL sterile fluid, and the catheter was placed to straight drainage.  COMPLICATIONS: None   CONDITION: Stable, extubated, transferred to PACU  PLAN: The patient will be discharged home and followup in 4 days for a voiding trial.

## 2017-01-04 NOTE — Progress Notes (Signed)
Per Dr. Noah Delaine pt is to restart plavix on Monday after his visit with him.

## 2017-01-04 NOTE — Anesthesia Postprocedure Evaluation (Signed)
Anesthesia Post Note  Patient: Brad Harrell  Procedure(s) Performed: Procedure(s) (LRB): CYSTOSCOPY WITH INSERTION OF UROLIFT x8 (N/A)  Patient location during evaluation: PACU Anesthesia Type: General Level of consciousness: sedated and patient cooperative Pain management: pain level controlled Vital Signs Assessment: post-procedure vital signs reviewed and stable Respiratory status: spontaneous breathing Cardiovascular status: stable Anesthetic complications: no       Last Vitals:  Vitals:   01/04/17 1221 01/04/17 1315  BP: (!) 146/83 (!) 155/62  Pulse: 67 96  Resp:  18  Temp: 36.5 C 36.4 C    Last Pain:  Vitals:   01/04/17 1315  TempSrc: Oral                 Nolon Nations

## 2017-01-04 NOTE — H&P (Signed)
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 Anemia    Iron Defficiency  . Cardiomyopathy ([place])   . 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 Diabetes mellitus without complication ([place])    TYPE 2  . Foley catheter in place    SINCE NOV 2017  . H/O cocaine abuse   . Hyperlipidemia   . Hypertension   . Hypothyroidism   . Liver abscess    measuring 7 x 9 cm/notes 03/04/2013  . Stroke ([place]) 2010   WEAK ON BOTH SIDES NOW TOTAL OF 3 STROKES   . Urinary retention    Past Surgical History:  Procedure Laterality Date  . APPENDECTOMY    . COLONOSCOPY N/A 03/11/2013   Procedure: COLONOSCOPY;  Surgeon: Brad Beams, MD;  Location: [facility];  Service: Endoscopy;  Laterality: N/A;  . ESOPHAGOGASTRODUODENOSCOPY N/A 03/11/2013   Procedure: ESOPHAGOGASTRODUODENOSCOPY (EGD);  Surgeon: Brad Beams, MD;  Location: [facility] ENDOSCOPY;  Service: Endoscopy;  Laterality: N/A;  . GIVENS CAPSULE STUDY N/A 03/12/2013   Procedure: GIVENS CAPSULE STUDY;  Surgeon: Brad Beams, MD;  Location: [facility];  Service: Endoscopy;  Laterality: N/A;  . INGUINAL HERNIA REPAIR Right   . TIBIA FRACTURE SURGERY Left     Home Medications:  Prescriptions Prior to Admission  Medication Sig Dispense Refill Last Dose  . acetaminophen (TYLENOL) 325 MG tablet Take 2 tablets (650 mg total) by mouth every 6 (six) hours as needed for mild pain (or Fever >/= 101).   Past Month at Unknown time  . aspirin EC 81 MG tablet Take 81 mg by mouth daily.   12/31/2016  . atorvastatin (LIPITOR) 20 MG tablet TAKE ONE TABLET BY MOUTH DAILY (Patient taking differently: TAKE 20 MG BY MOUTH DAILY AT BEDTIME.) 30 tablet 0 01/03/2017 at Unknown time  . carvedilol (COREG) 3.125 MG tablet Take 3.125 mg by mouth 2 (two) times daily.   01/04/2017 at  0730  . clopidogrel (PLAVIX) 75 MG tablet Take 75 mg by mouth daily.   12/29/2016  . doxazosin (CARDURA) 8 MG tablet Take 8 mg by mouth at bedtime.    01/03/2017 at Unknown time  . glucose blood (FREESTYLE LITE) test strip Use as instructed (Patient taking differently: 1 each by Other route daily. Use as instructed) 100 each 12 Not Taking at Unknown time  . glucose monitoring kit (FREESTYLE) monitoring kit 1 each by Does not apply route as needed for other. For blood sugar check (Patient taking differently: 1 each by Does not apply route daily. For blood sugar check) 1 each 0 Not Taking at Unknown time  . Lancets 28G MISC To check blood sugar three times daily with meals and at bedtime (Patient taking differently: 1 strip by Other route daily. ) 120 each 3 Not Taking at Unknown time  . levothyroxine (SYNTHROID, LEVOTHROID) 100 MCG tablet Take 100 mcg by mouth daily before breakfast.    01/04/2017 at 0730  . metFORMIN (GLUCOPHAGE) 500 MG tablet Take 500 mg by mouth daily with breakfast.    01/03/2017 at Unknown time  . finasteride (PROSCAR) 5 MG tablet Take 1 tablet (5 mg total) by mouth daily. (Patient not taking: Reported on 12/20/2016) 30 tablet 0 Not Taking at Unknown time   Allergies: No Known Allergies  Family History  Problem Relation Age of Onset  . Hypertension Mother   . Hypertension Father   . Diabetes type II Father   . Hypertension Brother    Social History:  reports that he has quit smoking. His smoking use included Cigarettes. He has a 51.00 pack-year smoking history. He has never used smokeless tobacco. He reports that he uses drugs, including Cocaine. He reports that he does not drink alcohol.  Review of Systems  Genitourinary: Positive for frequency and urgency.  All other systems reviewed and are negative.   Physical Exam:  Vital signs in last 24 hours: Temp:  [98.2 F (36.8 C)] 98.2 F (36.8 C) (03/08 0845) Pulse Rate:  [102] 102 (03/08 0845) Resp:  [18] 18 (03/08 0845) BP:  (147)/(98) 147/98 (03/08 0845) SpO2:  [100 %] 100 % (03/08 0845) Weight:  [85.3 kg (188 lb)-85.5 kg (188 lb 8 oz)] 85.3 kg (188 lb) (03/08 0849) Physical Exam  Constitutional: He is oriented to person, place, and time. He appears well-developed and well-nourished.  HENT:  Head: Normocephalic and atraumatic.  Eyes: EOM are normal. Pupils are equal, round, and reactive to light.  Neck: Normal range of motion. No thyromegaly present.  Cardiovascular: Normal rate and regular rhythm.   Respiratory: Effort normal. No respiratory distress.  GI: Soft. He exhibits no distension.  Musculoskeletal: Normal range of motion. He exhibits no edema.  Neurological: He is alert and oriented to person, place, and time.  Skin: Skin is warm and dry.  Psychiatric: He has a normal mood and affect. His behavior is normal. Judgment and thought content normal.    Laboratory Data:  Results for orders placed or performed during the hospital encounter of 01/04/17 (from the past 24 hour(s))  Glucose, capillary     Status: Abnormal   Collection Time: 01/04/17  8:44 AM  Result Value Ref Range   Glucose-Capillary 119 (H) 65 - 99 mg/dL   Recent Results (from the past 240 hour(s))  Surgical pcr screen     Status: None   Collection Time: 12/26/16 10:30 AM  Result Value Ref Range Status   MRSA, PCR NEGATIVE NEGATIVE Final   Staphylococcus aureus NEGATIVE NEGATIVE Final    Comment:        The Xpert SA Assay (FDA approved for NASAL specimens in patients over 44 years of age), is one component of a comprehensive surveillance program.  Test performance has been validated by [facility] for patients greater than or equal to 82 year old. It is not intended to diagnose infection nor to guide or monitor treatment.    Creatinine: No results for input(s): CREATININE in the last 168 hours. Baseline Creatinine: unknwon  Impression/Assessment:  71 yo with BPH and urinary retention  Plan:  The  risks/benefits/alternatives to Urolift implantation was discussed with the patient and he understands and wishes to proceed with surgery  Nicolette Bang 01/04/2017, 10:16 AM

## 2017-01-05 ENCOUNTER — Encounter (HOSPITAL_COMMUNITY): Payer: Self-pay | Admitting: Urology

## 2017-06-17 IMAGING — DX DG CHEST 2V
2 series · 2 of 2 positions shown · non-contrast
Comparison: 08/21/2016.

CLINICAL DATA: Tachycardia.  Abdominal pain.

EXAM:
CHEST  2 VIEW

[w chest pa]
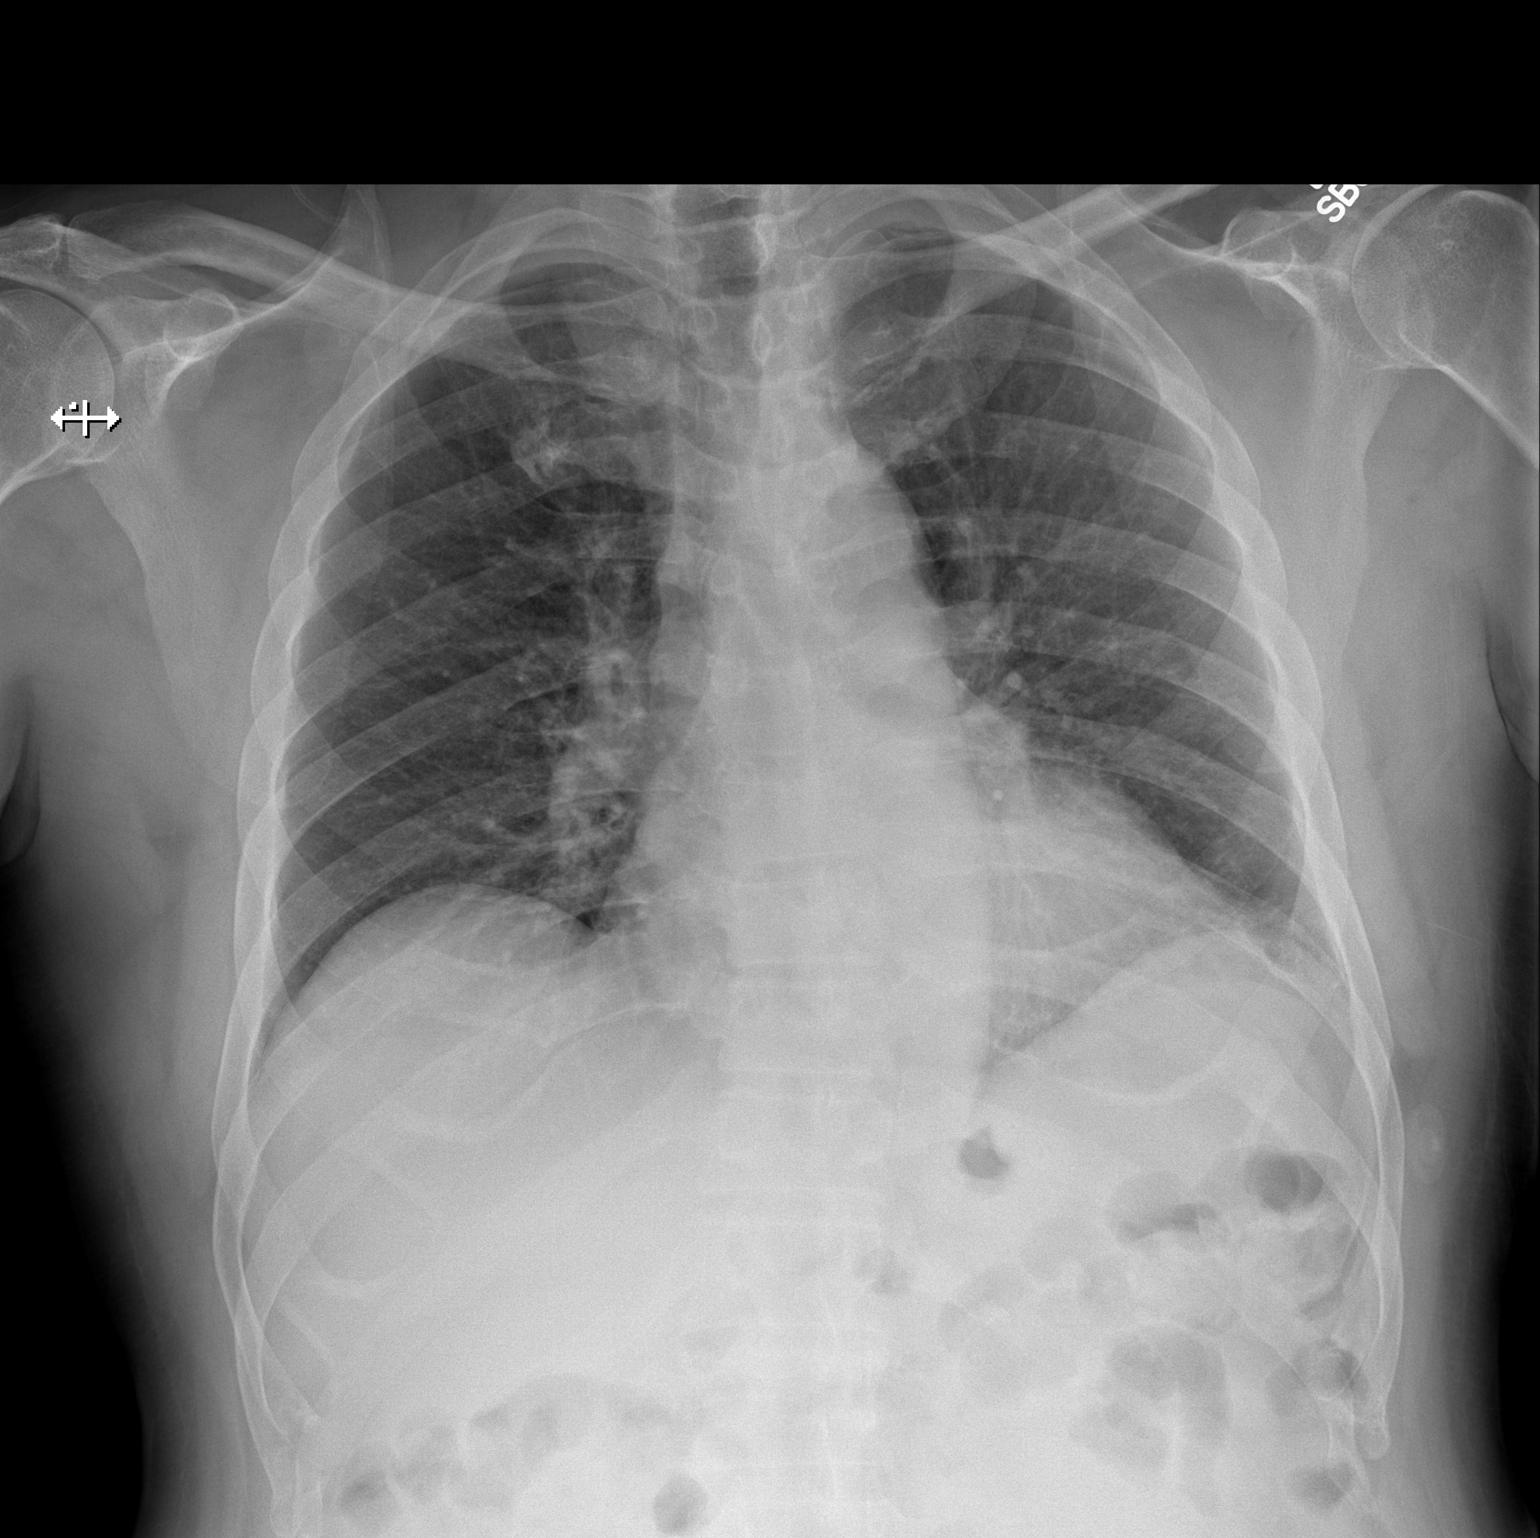

[w chest lat]
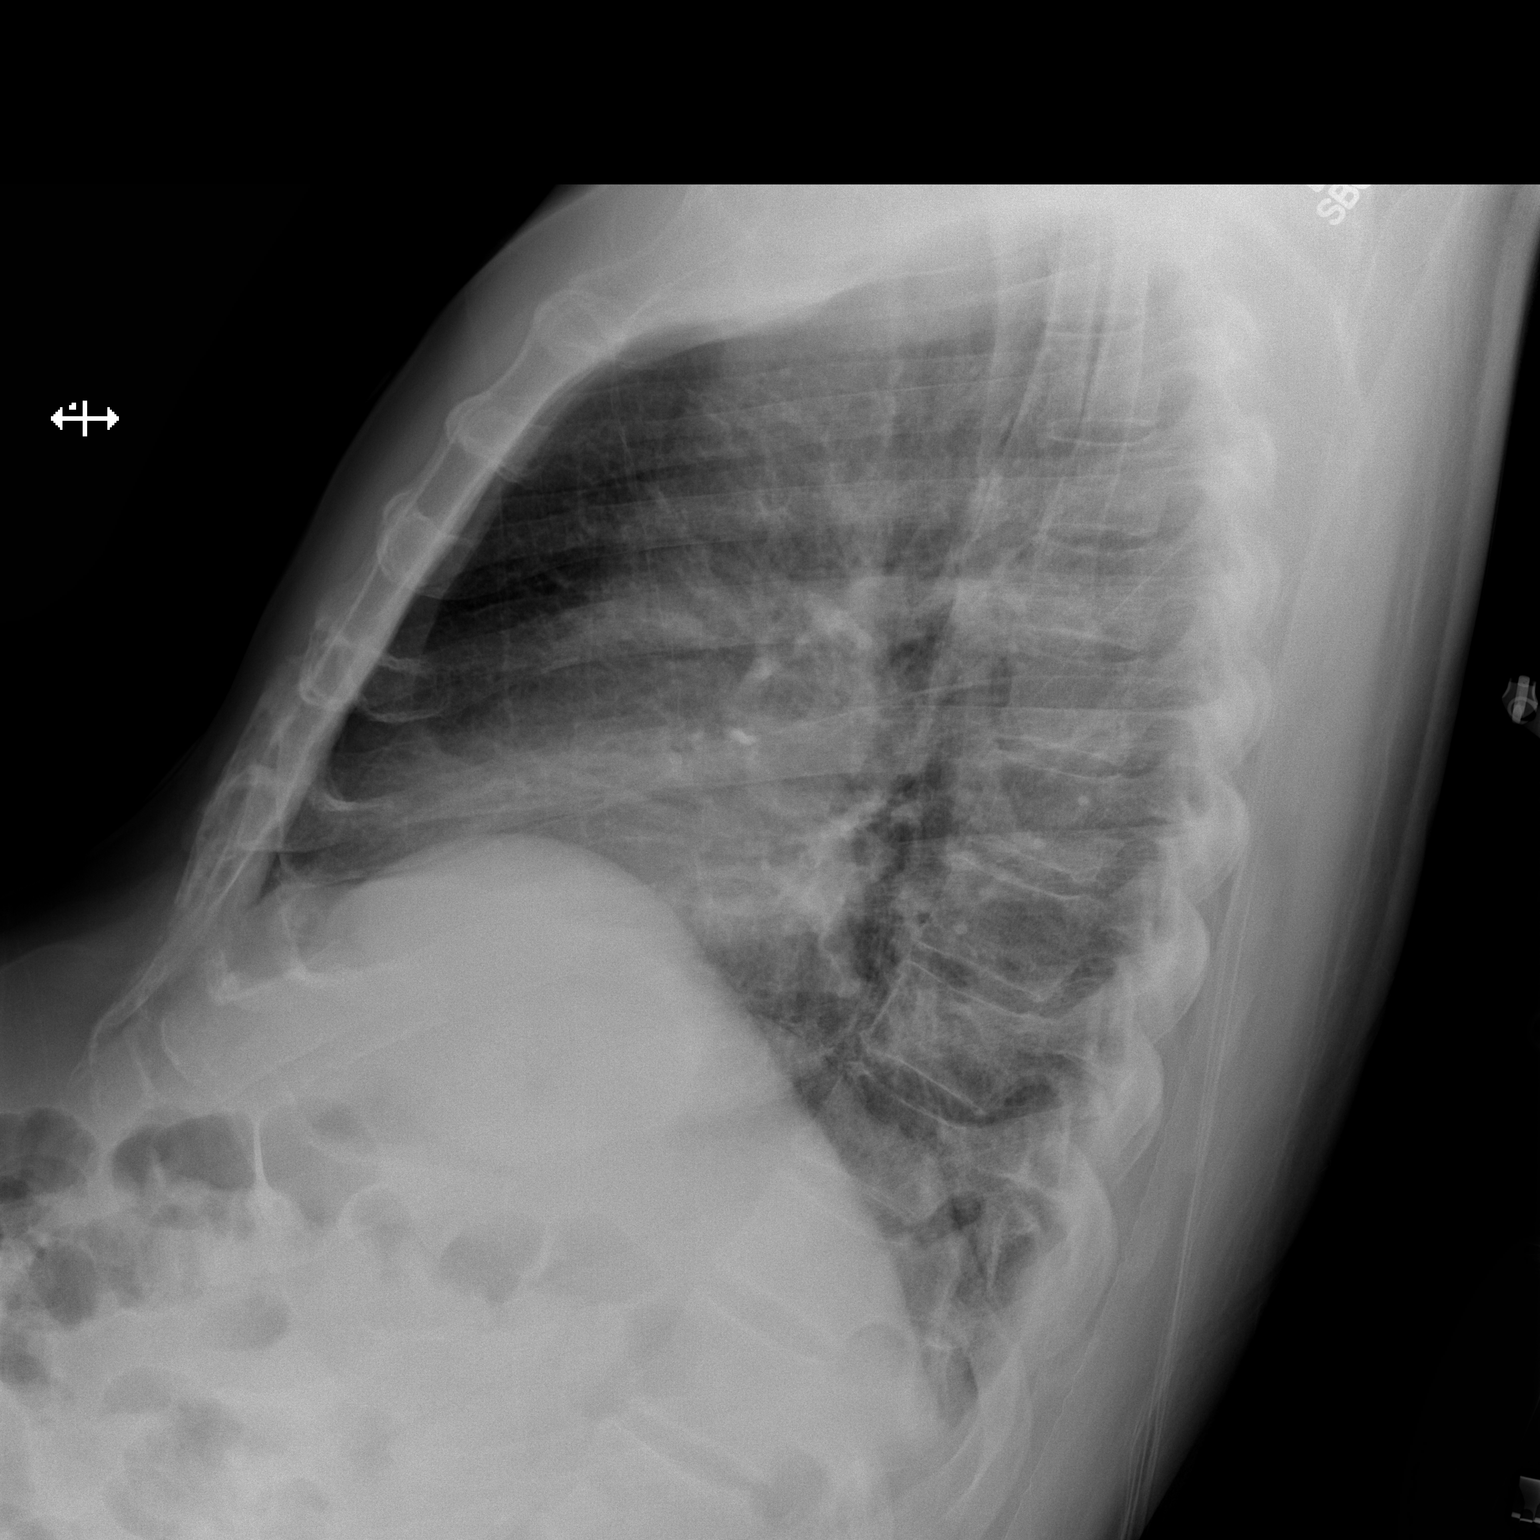

[2 of 2 positions shown; findings below may reference images not displayed]

FINDINGS: The cardiac silhouette remains borderline enlarged. Clear lungs with
normal vascularity. Mild diffuse peribronchial thickening.
Unremarkable bones.
IMPRESSION: Mild bronchitic changes.

## 2017-06-17 IMAGING — CT CT ABD-PELV W/ CM
2 of 5 series · 4 of 46 positions shown, 5 images · IV contrast (Iodine)
Comparison: CT scan 03/04/2013

CLINICAL DATA: Tachycardia, history of urinary retention, noticed
blood-stained in his underwear

EXAM:
CT ABDOMEN AND PELVIS WITH CONTRAST
TECHNIQUE: Multidetector CT imaging of the abdomen and pelvis was performed
using the standard protocol following bolus administration of
intravenous contrast.
CONTRAST:  100mL FJ5TIJ-EUU IOPAMIDOL (FJ5TIJ-EUU) INJECTION 61%

[Series 204: coronal · coronal · 0.45mm/px · 3 of 149 slices shown, 4 images]
[im 33/149  soft-tissue]
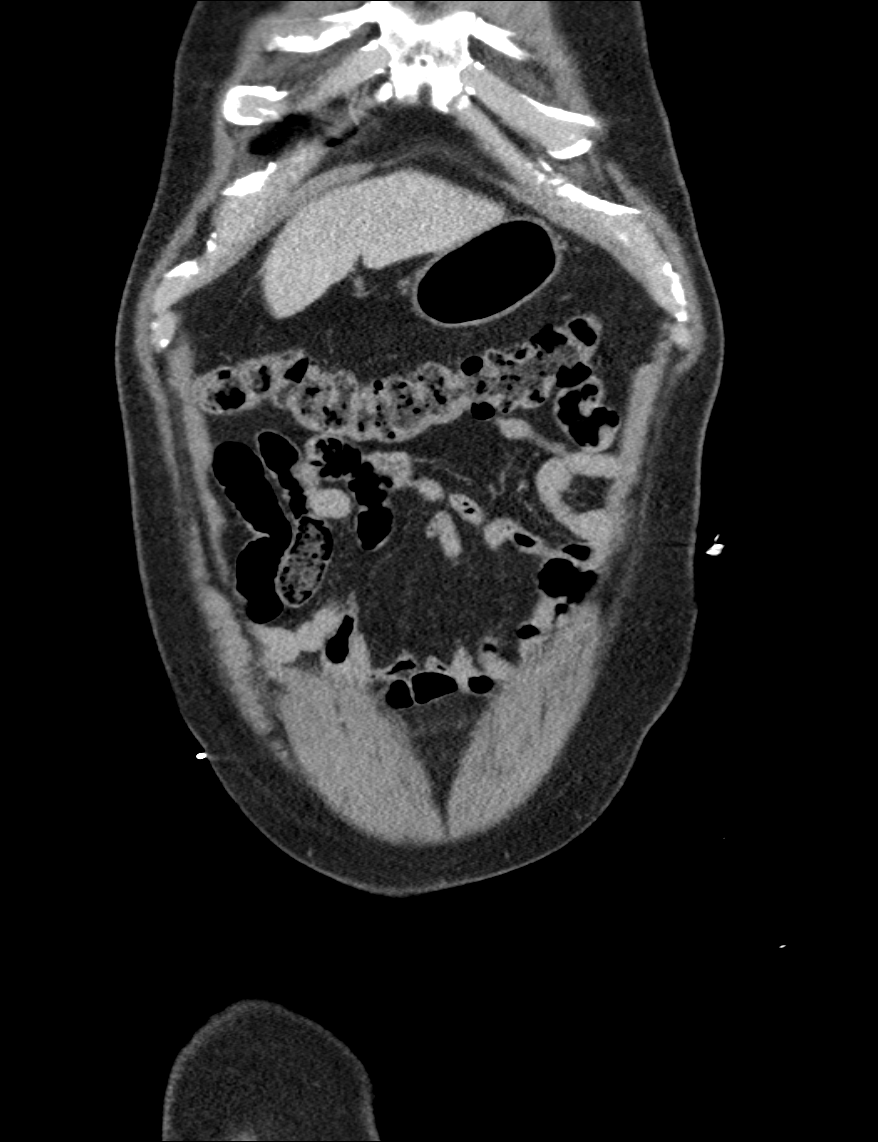
[im 33/149  bone]
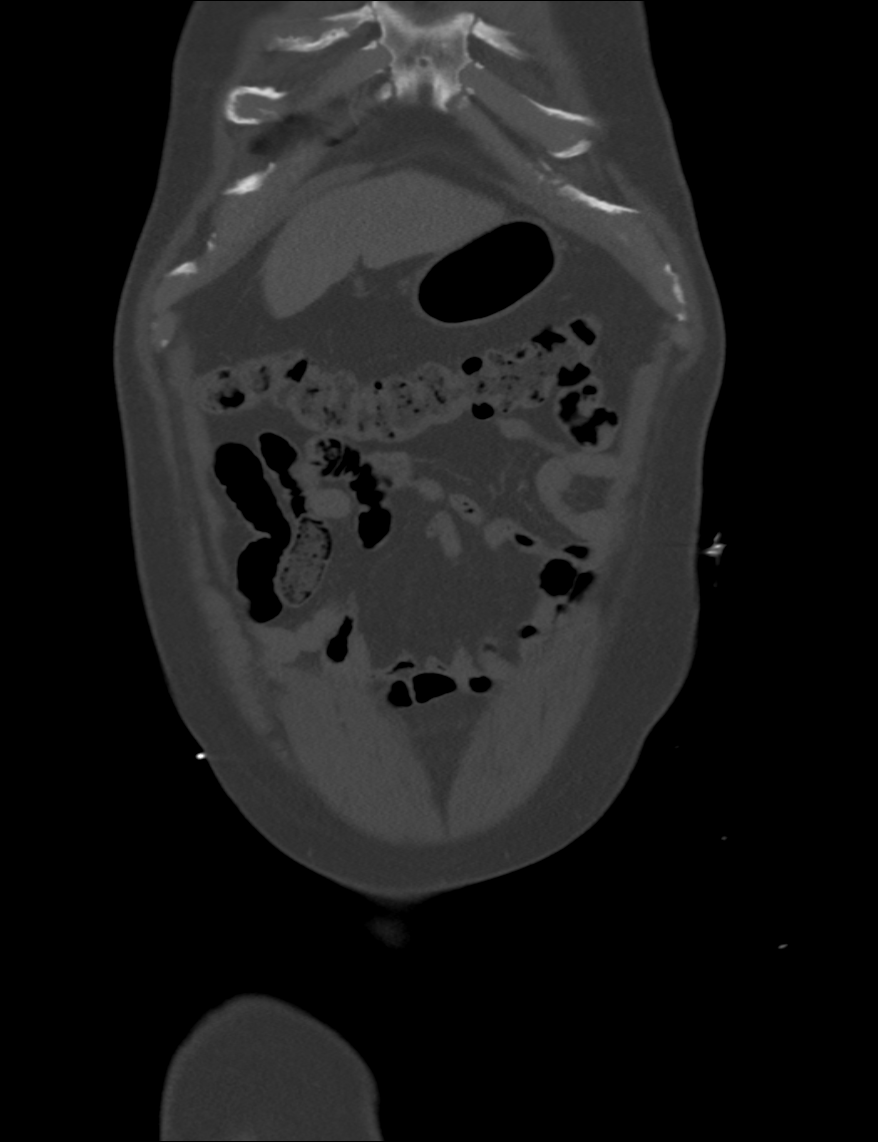
[im 83/149  soft-tissue]
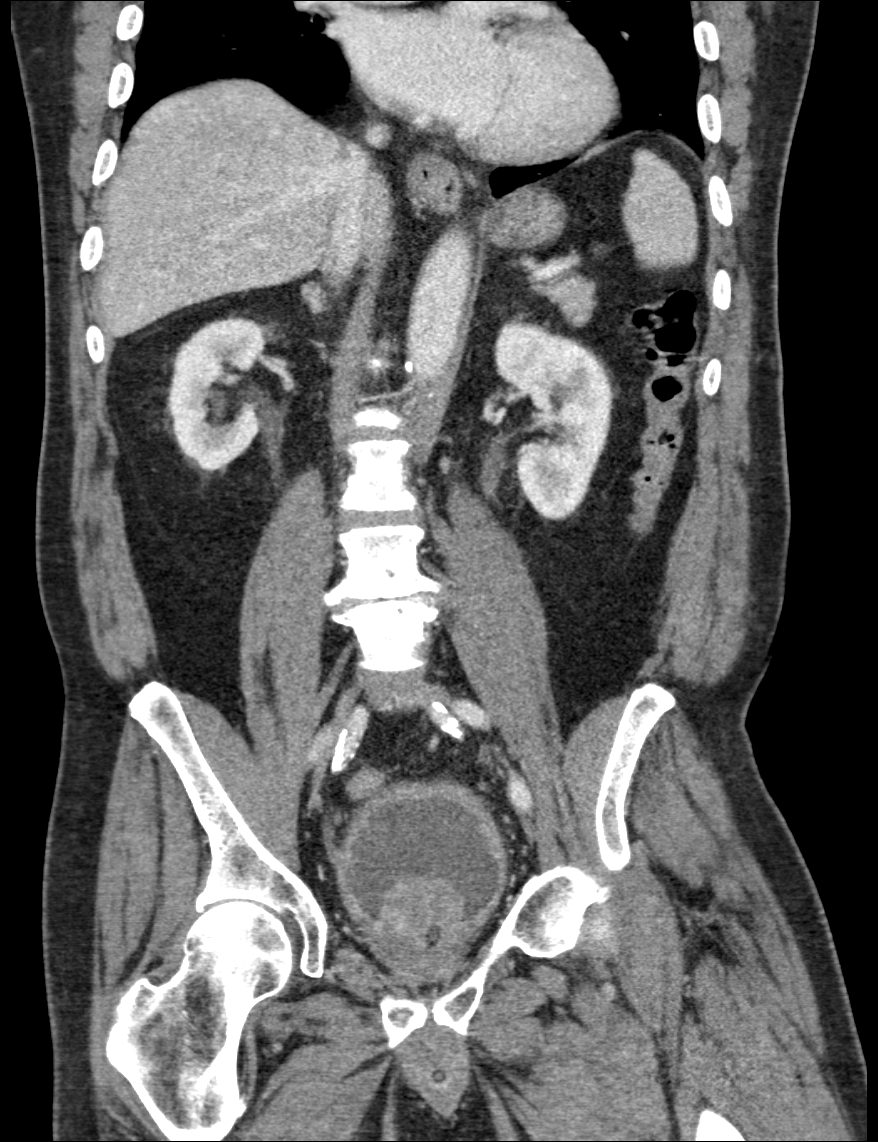
[im 116/149  soft-tissue]
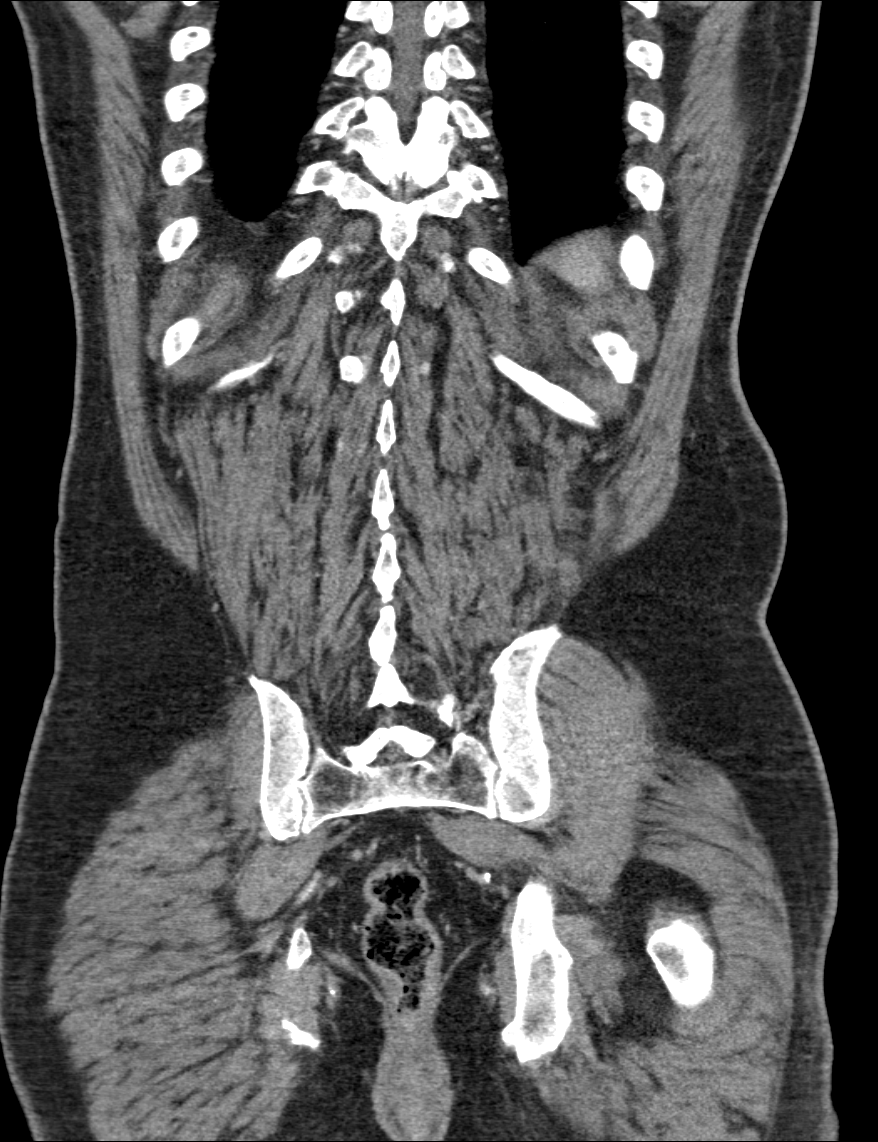

[Series 205: sagittal · sagittal · 0.45mm/px · 1 of 178 slices shown]
[im 60/178  soft-tissue]
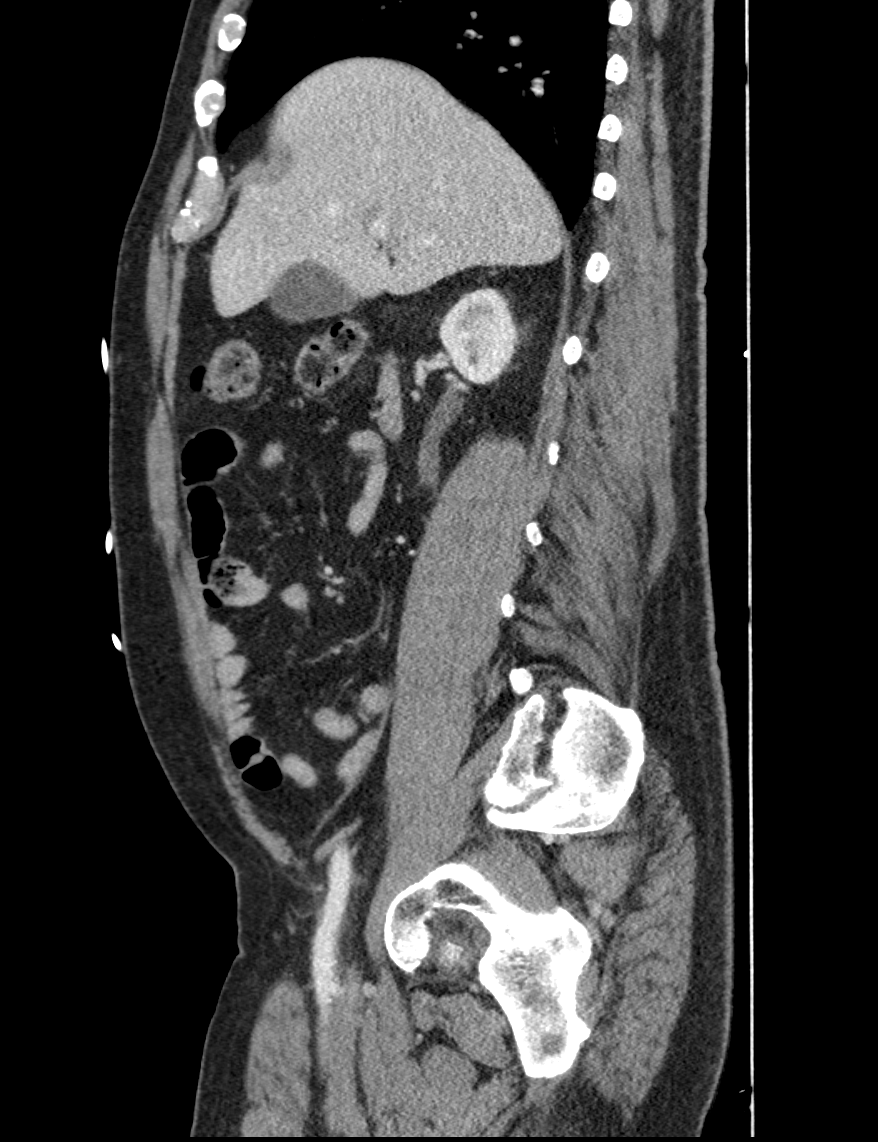

[4 of 46 positions shown; findings below may reference images not displayed]

FINDINGS: Lower chest: Small hiatal hernia.  The lung bases are normal

Hepatobiliary: Enhanced liver shows no focal mass. No calcified
gallstones are noted within gallbladder.

Pancreas: Enhanced pancreas with normal appearance.

Spleen: Enhanced spleen is normal.

Adrenals/Urinary Tract: No adrenal gland mass. Enhanced kidneys are
symmetrical in size. No hydronephrosis or hydroureter. Delayed renal
images shows bilateral renal symmetrical excretion. There is some
cortical scarring in lower pole of the right kidney posterior
aspect.

There is thickening of urinary bladder wall and mild stranding of
perivesical fat. Findings highly suspicious for chronic inflammation
or cystitis. Again noted enlarged prostate gland with polypoid
indentation of urinary bladder base. Prostate gland measures at
least 6.5 by 6.2 cm. There is a Foley catheter with balloon inflated
within urinary bladder. No definite bladder hematoma is identified.
No nephrolithiasis. Tiny right posterior bladder diverticulum
measures 1.2 cm.

Stomach/Bowel: No small bowel obstruction. No gastric outlet
obstruction. The patient is status post appendectomy. No pericecal
inflammation. Some colonic stool noted in right colon and transverse
colon. Few diverticula are noted sigmoid colon. No evidence of acute
colitis or diverticulitis. No distal colonic obstruction.

Vascular/Lymphatic: Atherosclerotic calcifications of abdominal
aorta and iliac arteries are noted. No adenopathy.

Reproductive: Enlarged prostate gland as described above. No pelvic
adenopathy. No inguinal adenopathy.

Other: No ascites or free abdominal air.

Musculoskeletal: No destructive bony lesions are noted. Sagittal
images of the spine shows degenerative changes thoracolumbar spine.
Significant disc space flattening with vacuum disc phenomenon and
endplate sclerotic changes mild anterior and mild posterior spurring
at L3-L4 and L5-S1 level.
IMPRESSION: 1. No nephrolithiasis.  No hydronephrosis or hydroureter.
2. There is thickening of urinary bladder wall and mild stranding of
fat surrounding the bladder. A tiny right posterior bladder
diverticulum measures 1.2 cm. Findings are suspicious for chronic
bladder inflammation or acute cystitis. Clinical correlation is
necessary.
3. Again noted enlarged prostate gland with polypoid indentation of
urinary bladder base. There is a Foley catheter with balloon
inflated within urinary bladder.
4. No definite evidence of urinary bladder hematoma.
5. No small bowel or colonic obstruction.  Status post appendectomy.
6. No evidence of colitis or diverticulitis.
7. Atherosclerotic vascular calcifications.
8. Degenerative changes lumbar spine.

## 2017-11-09 ENCOUNTER — Other Ambulatory Visit: Payer: Self-pay | Admitting: Gastroenterology

## 2017-11-11 ENCOUNTER — Emergency Department (HOSPITAL_COMMUNITY)
Admission: EM | Admit: 2017-11-11 | Discharge: 2017-11-11 | Disposition: A | Discharge status: Home / Self Care | Payer: Medicare Other | Attending: Emergency Medicine | Admitting: Emergency Medicine

## 2017-11-11 ENCOUNTER — Emergency Department (HOSPITAL_COMMUNITY): Payer: Medicare Other

## 2017-11-11 ENCOUNTER — Encounter (HOSPITAL_COMMUNITY): Payer: Self-pay | Admitting: Emergency Medicine

## 2017-11-11 DIAGNOSIS — Z7984 Long term (current) use of oral hypoglycemic drugs: Secondary | ICD-10-CM | POA: Diagnosis not present

## 2017-11-11 DIAGNOSIS — E119 Type 2 diabetes mellitus without complications: Secondary | ICD-10-CM | POA: Diagnosis not present

## 2017-11-11 DIAGNOSIS — E039 Hypothyroidism, unspecified: Secondary | ICD-10-CM | POA: Insufficient documentation

## 2017-11-11 DIAGNOSIS — Z7982 Long term (current) use of aspirin: Secondary | ICD-10-CM | POA: Insufficient documentation

## 2017-11-11 DIAGNOSIS — R109 Unspecified abdominal pain: Secondary | ICD-10-CM | POA: Insufficient documentation

## 2017-11-11 DIAGNOSIS — Z87891 Personal history of nicotine dependence: Secondary | ICD-10-CM | POA: Diagnosis not present

## 2017-11-11 DIAGNOSIS — Z8673 Personal history of transient ischemic attack (TIA), and cerebral infarction without residual deficits: Secondary | ICD-10-CM | POA: Diagnosis not present

## 2017-11-11 DIAGNOSIS — M5431 Sciatica, right side: Secondary | ICD-10-CM

## 2017-11-11 DIAGNOSIS — M5441 Lumbago with sciatica, right side: Secondary | ICD-10-CM | POA: Diagnosis not present

## 2017-11-11 DIAGNOSIS — Z79899 Other long term (current) drug therapy: Secondary | ICD-10-CM | POA: Insufficient documentation

## 2017-11-11 DIAGNOSIS — M545 Low back pain: Secondary | ICD-10-CM | POA: Diagnosis present

## 2017-11-11 DIAGNOSIS — I5040 Unspecified combined systolic (congestive) and diastolic (congestive) heart failure: Secondary | ICD-10-CM | POA: Diagnosis not present

## 2017-11-11 DIAGNOSIS — I110 Hypertensive heart disease with heart failure: Secondary | ICD-10-CM | POA: Diagnosis not present

## 2017-11-11 LAB — BASIC METABOLIC PANEL
ANION GAP: 10 (ref 5–15)
BUN: 13 mg/dL (ref 6–20)
CHLORIDE: 106 mmol/L (ref 101–111)
CO2: 23 mmol/L (ref 22–32)
Calcium: 9.6 mg/dL (ref 8.9–10.3)
Creatinine, Ser: 1.06 mg/dL (ref 0.61–1.24)
GFR calc Af Amer: >60 mL/min (ref >60)
GLUCOSE: 216 mg/dL — AB (ref 65–99)
POTASSIUM: 4.2 mmol/L (ref 3.5–5.1)
Sodium: 139 mmol/L (ref 135–145)

## 2017-11-11 LAB — CBC
HEMATOCRIT: 41.3 % (ref 39.0–52.0)
HEMOGLOBIN: 13.6 g/dL (ref 13.0–17.0)
MCH: 28.8 pg (ref 26.0–34.0)
MCHC: 32.9 g/dL (ref 30.0–36.0)
MCV: 87.3 fL (ref 78.0–100.0)
PLATELETS: 168 10*3/uL (ref 150–400)
RBC: 4.73 MIL/uL (ref 4.22–5.81)
RDW: 13.3 % (ref 11.5–15.5)
WBC: 7.4 10*3/uL (ref 4.0–10.5)

## 2017-11-11 LAB — URINALYSIS, ROUTINE W REFLEX MICROSCOPIC
BILIRUBIN URINE: NEGATIVE
Bacteria, UA: NONE SEEN
Glucose, UA: >=500 mg/dL — AB
Hgb urine dipstick: NEGATIVE
KETONES UR: NEGATIVE mg/dL
LEUKOCYTES UA: NEGATIVE
Nitrite: NEGATIVE
PH: 5 (ref 5.0–8.0)
Protein, ur: NEGATIVE mg/dL
Specific Gravity, Urine: 1.018 (ref 1.005–1.030)

## 2017-11-11 MED ORDER — MORPHINE SULFATE (PF) 4 MG/ML IV SOLN
6.0000 mg | Freq: Once | INTRAVENOUS | Status: AC
  Administered 2017-11-11: 6 mg via INTRAVENOUS
  Filled 2017-11-11: qty 2

## 2017-11-11 MED ORDER — PREDNISONE 20 MG PO TABS: 40.0000 mg | ORAL_TABLET | Freq: Every day | ORAL | 0 refills | Status: AC

## 2017-11-11 MED ORDER — IOPAMIDOL (ISOVUE-300) INJECTION 61%
INTRAVENOUS | Status: AC
  Administered 2017-11-11: 100 mL via INTRAVENOUS
  Filled 2017-11-11: qty 100

## 2017-11-11 MED ORDER — CYCLOBENZAPRINE HCL 10 MG PO TABS: 10.0000 mg | ORAL_TABLET | Freq: Three times a day (TID) | ORAL | 0 refills | Status: DC | PRN

## 2017-11-11 MED ORDER — IBUPROFEN 600 MG PO TABS: 600.0000 mg | ORAL_TABLET | Freq: Three times a day (TID) | ORAL | 0 refills | Status: DC | PRN

## 2017-11-11 NOTE — ED Notes (Signed)
Patient given discharge instructions and verbalized understanding.  Patient stable to discharge at this time.  Patient is alert and oriented to baseline.  No distressed noted at this time.  All belongings taken with the patient at discharge.   

## 2017-11-11 NOTE — ED Notes (Signed)
Iv attempted x 1 wihtput success.

## 2017-11-11 NOTE — ED Notes (Signed)
Patient transported to CT 

## 2017-11-11 NOTE — ED Triage Notes (Signed)
Pt states he was lying in the bed 2 weeks ago and began having right lower back pain. He denies any urinary symptoms, denies abdominal pain, denies abdominal distention. PT at baseline as far as difficulty walking and mentation. Disoriented to month. PT daughter, who takes care of him, states "his belly is hard and distended." Pt disagrees.

## 2017-11-11 NOTE — ED Notes (Signed)
ED Provider at bedside. 

## 2017-11-14 NOTE — ED Provider Notes (Signed)
[facility] EMERGENCY DEPARTMENT Provider Note   CSN: [id number] Arrival date & time: 11/11/17  1225     History   Chief Complaint Chief Complaint  Patient presents with  . Back Pain    HPI Brad Harrell is a 72 y.o. male.  HPI Patient is a 72 year old male who presents the emergency department with complaints of 2 weeks of right low back pain and flank pain without significant radiation.  Patient denies abdominal pain at this time but family is concerned that his abdomen appears more swollen.  Denies urinary frequency or dysuria.  Denies nausea vomiting diarrhea.  Denies fevers and chills.  Patient has never had back or flank pain like this before.  No new rash.  No recent heavy lifting or trauma.  Denies weakness of his arms or legs.  Denies midline back pain.  No use of IV drugs. Pain is moderate in severity   Past Medical History:  Diagnosis Date  . Anemia    Iron Defficiency  . Cardiomyopathy ([place])   . 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 Diabetes mellitus without complication ([place])    TYPE 2  . Foley catheter in place    SINCE NOV 2017  . H/O cocaine abuse   . Hyperlipidemia   . Hypertension   . Hypothyroidism   . Liver abscess    measuring 7 x 9 cm/notes 03/04/2013  . Stroke ([place]) 2010   WEAK ON BOTH SIDES NOW TOTAL OF 3 STROKES   . Urinary retention     Patient Active Problem List   Diagnosis Date Noted  . Hypothermia 08/21/2016  . Metabolic acidosis 40/98/1191  . Elevated lactic acid level 08/21/2016  . Combined systolic and diastolic congestive heart failure ([place])   . Diabetes mellitus, new onset ([place]) 07/01/2014  . Left arm weakness 07/01/2014  . History of CVA  07/01/2014  . Urinary tract infection 07/01/2014  . Thrombocytopenia ([place]) 07/01/2014  . DKA (diabetic ketoacidoses) ([place]) 06/30/2014  . Physical deconditioning 03/13/2013  . Anemia, iron deficiency 03/08/2013  . Bacteremia 03/06/2013  .  History of Liver abscess 03/05/2013  . Encephalopathy, metabolic 47/82/9562  . Anemia 03/05/2013  . Complicated UTI (urinary tract infection) 08/15/2012  . SIRS (systemic inflammatory response syndrome) ([place]) 08/15/2012  . ARF (acute renal failure) ([place]) 08/15/2012  . Hypertension   . Urinary retention   . Stroke ([place])   . Hyperlipidemia   . Cardiomyopathy ([place])   . H/O cocaine abuse     Past Surgical History:  Procedure Laterality Date  . APPENDECTOMY    . COLONOSCOPY N/A 03/11/2013   Procedure: COLONOSCOPY;  Surgeon: Beryle Beams, MD;  Location: [facility];  Service: Endoscopy;  Laterality: N/A;  . CYSTOSCOPY WITH INSERTION OF UROLIFT N/A 01/04/2017   Procedure: CYSTOSCOPY WITH INSERTION OF UROLIFT x8;  Surgeon: Cleon Gustin, MD;  [facility];  Service: Urology;  Laterality: N/A;  . ESOPHAGOGASTRODUODENOSCOPY N/A 03/11/2013   Procedure: ESOPHAGOGASTRODUODENOSCOPY (EGD);  Surgeon: Beryle Beams, MD;  Location: [facility] ENDOSCOPY;  Service: Endoscopy;  Laterality: N/A;  . GIVENS CAPSULE STUDY N/A 03/12/2013   Procedure: GIVENS CAPSULE STUDY;  Surgeon: Beryle Beams, MD;  Location: [facility];  Service: Endoscopy;  Laterality: N/A;  . INGUINAL HERNIA REPAIR Right   . TIBIA FRACTURE SURGERY Left        Home Medications    Prior to Admission medications   Medication Sig Start Date End Date Taking? Authorizing  Provider  acetaminophen (TYLENOL) 325 MG tablet Take 2 tablets (650 mg total) by mouth every 6 (six) hours as needed for mild pain (or Fever >/= 101). 08/21/16  Yes Geradine Girt, DO  aspirin EC 81 MG tablet Take 81 mg by mouth daily.   Yes [provider]  atorvastatin (LIPITOR) 20 MG tablet TAKE ONE TABLET BY MOUTH DAILY Patient taking differently: TAKE 20 MG BY MOUTH DAILY AT BEDTIME. 09/07/14  Yes Reed, Tiffany L, DO  carvedilol (COREG) 3.125 MG tablet Take 3.125 mg by mouth 2 (two) times daily. 11/11/16  Yes [provider]  clopidogrel  (PLAVIX) 75 MG tablet Take 75 mg by mouth daily.   Yes [provider]  doxazosin (CARDURA) 8 MG tablet Take 8 mg by mouth at bedtime.    Yes [provider]  glucose blood (FREESTYLE LITE) test strip Use as instructed Patient taking differently: 1 each by Other route daily. Use as instructed 07/31/14  Yes Pandey, Mahima, MD  glucose monitoring kit (FREESTYLE) monitoring kit 1 each by Does not apply route as needed for other. For blood sugar check Patient taking differently: 1 each by Does not apply route daily. For blood sugar check 07/31/14  Yes Blanchie Serve, MD  Lancets 28G MISC To check blood sugar three times daily with meals and at bedtime Patient taking differently: 1 strip by Other route daily.  07/31/14  Yes Blanchie Serve, MD  levothyroxine (SYNTHROID, LEVOTHROID) 100 MCG tablet Take 100 mcg by mouth daily before breakfast.    Yes [provider]  metFORMIN (GLUCOPHAGE) 500 MG tablet Take 500 mg by mouth daily with breakfast.    Yes [provider]  traMADol (ULTRAM) 50 MG tablet Take 1 tablet (50 mg total) by mouth every 6 (six) hours as needed. 01/04/17  Yes McKenzie, Candee Furbish, MD  cyclobenzaprine (FLEXERIL) 10 MG tablet Take 1 tablet (10 mg total) by mouth 3 (three) times daily as needed for muscle spasms. 11/11/17   Jola Schmidt, MD  finasteride (PROSCAR) 5 MG tablet Take 1 tablet (5 mg total) by mouth daily. Patient not taking: Reported on 12/20/2016 08/22/16   Geradine Girt, DO  ibuprofen (ADVIL,MOTRIN) 600 MG tablet Take 1 tablet (600 mg total) by mouth every 8 (eight) hours as needed. 11/11/17   Jola Schmidt, MD  predniSONE (DELTASONE) 20 MG tablet Take 2 tablets (40 mg total) by mouth daily for 5 days. 11/11/17 11/16/17  Jola Schmidt, MD    Family History Family History  Problem Relation Age of Onset  . Hypertension Mother   . Hypertension Father   . Diabetes type II Father   . Hypertension Brother     Social History Social History    Tobacco Use  . Smoking status: Former Smoker    Packs/day: 1.00    Years: 51.00    Pack years: 51.00    Types: Cigarettes  . Smokeless tobacco: Never Used  . Tobacco comment: QUIT NOV 2010  Substance Use Topics  . Alcohol use: No    Comment: NOV 2010  . Drug use: Yes    Types: Cocaine    Comment: NOV 2010 LAST USED     Allergies   Patient has no known allergies.   Review of Systems Review of Systems  All other systems reviewed and are negative.    Physical Exam Updated Vital Signs BP 137/86   Pulse 87   Temp 98 F (36.7 C)   Resp 13   SpO2 97%  Physical Exam  Constitutional: He is oriented to person, place, and time. He appears well-developed and well-nourished.  HENT:  Head: Normocephalic and atraumatic.  Eyes: EOM are normal.  Neck: Normal range of motion.  Cardiovascular: Normal rate, regular rhythm, normal heart sounds and intact distal pulses.  Pulmonary/Chest: Effort normal and breath sounds normal. No respiratory distress.  Abdominal: Soft. He exhibits no distension. There is no tenderness.  Musculoskeletal: Normal range of motion.  Full range of motion of bilateral hips.  No thoracic or lumbar point tenderness.  Mild paralumbar tenderness on the right without significant spasm.  No signs of zoster.  Neurological: He is alert and oriented to person, place, and time.  Skin: Skin is warm and dry.  Psychiatric: He has a normal mood and affect. Judgment normal.  Nursing note and vitals reviewed.    ED Treatments / Results  Labs (all labs ordered are listed, but only abnormal results are displayed) Labs Reviewed  BASIC METABOLIC PANEL - Abnormal; Notable for the following components:      Result Value   Glucose, Bld 216 (*)    All other components within normal limits  URINALYSIS, ROUTINE W REFLEX MICROSCOPIC - Abnormal; Notable for the following components:   Glucose, UA >=500 (*)    Squamous Epithelial / LPF 0-5 (*)    All other components  within normal limits  CBC    EKG  EKG Interpretation None       Radiology No results found.  Procedures Procedures (including critical care time)  Medications Ordered in ED Medications  morphine 4 MG/ML injection 6 mg (6 mg Intravenous Given 11/11/17 1347)  iopamidol (ISOVUE-300) 61 % injection (100 mLs Intravenous Contrast Given 11/11/17 1501)     Initial Impression / Assessment and Plan / ED Course  I have reviewed the triage vital signs and the nursing notes.  Pertinent labs & imaging results that were available during my care of the patient were reviewed by me and considered in my medical decision making (see chart for details).     Patient is overall well-appearing.  Symptoms improved.  Could represent right-sided sciatica as some of his pain does radiate towards his right gluteal region.  No weakness of his right lower extremity.  CT scan of his abdomen pelvis demonstrates no acute intra-abdominal pathology.  Close primary care follow-up.  Patient understands return to the emergency department for new or worsening symptoms.  Final Clinical Impressions(s) / ED Diagnoses   Final diagnoses:  Right sided sciatica  Acute abdominal pain    ED Discharge Orders        Ordered    ibuprofen (ADVIL,MOTRIN) 600 MG tablet  Every 8 hours PRN     11/11/17 1553    cyclobenzaprine (FLEXERIL) 10 MG tablet  3 times daily PRN     11/11/17 1553    predniSONE (DELTASONE) 20 MG tablet  Daily     11/11/17 1553       Jola Schmidt, MD 11/14/17 2327

## 2018-03-13 ENCOUNTER — Encounter (HOSPITAL_COMMUNITY): Payer: Self-pay | Admitting: *Deleted

## 2018-03-13 ENCOUNTER — Other Ambulatory Visit: Payer: Self-pay

## 2018-03-15 ENCOUNTER — Ambulatory Visit (HOSPITAL_COMMUNITY): Payer: Medicare Other | Admitting: Anesthesiology

## 2018-03-15 ENCOUNTER — Ambulatory Visit (HOSPITAL_COMMUNITY)
Admission: RE | Admit: 2018-03-15 | Discharge: 2018-03-15 | Disposition: A | Discharge status: Home / Self Care | Payer: Medicare Other | Source: Ambulatory Visit | Attending: Gastroenterology | Admitting: Gastroenterology

## 2018-03-15 ENCOUNTER — Encounter (HOSPITAL_COMMUNITY)
Admission: RE | Disposition: A | Discharge status: Home / Self Care | Payer: Self-pay | Source: Ambulatory Visit | Attending: Gastroenterology

## 2018-03-15 ENCOUNTER — Encounter (HOSPITAL_COMMUNITY): Payer: Self-pay

## 2018-03-15 DIAGNOSIS — Z7982 Long term (current) use of aspirin: Secondary | ICD-10-CM | POA: Insufficient documentation

## 2018-03-15 DIAGNOSIS — E119 Type 2 diabetes mellitus without complications: Secondary | ICD-10-CM | POA: Insufficient documentation

## 2018-03-15 DIAGNOSIS — E785 Hyperlipidemia, unspecified: Secondary | ICD-10-CM | POA: Diagnosis not present

## 2018-03-15 DIAGNOSIS — D123 Benign neoplasm of transverse colon: Secondary | ICD-10-CM | POA: Insufficient documentation

## 2018-03-15 DIAGNOSIS — Z7984 Long term (current) use of oral hypoglycemic drugs: Secondary | ICD-10-CM | POA: Insufficient documentation

## 2018-03-15 DIAGNOSIS — I110 Hypertensive heart disease with heart failure: Secondary | ICD-10-CM | POA: Insufficient documentation

## 2018-03-15 DIAGNOSIS — Z79891 Long term (current) use of opiate analgesic: Secondary | ICD-10-CM | POA: Diagnosis not present

## 2018-03-15 DIAGNOSIS — I429 Cardiomyopathy, unspecified: Secondary | ICD-10-CM | POA: Diagnosis not present

## 2018-03-15 DIAGNOSIS — I5042 Chronic combined systolic (congestive) and diastolic (congestive) heart failure: Secondary | ICD-10-CM | POA: Diagnosis not present

## 2018-03-15 DIAGNOSIS — D122 Benign neoplasm of ascending colon: Secondary | ICD-10-CM | POA: Diagnosis not present

## 2018-03-15 DIAGNOSIS — Z79899 Other long term (current) drug therapy: Secondary | ICD-10-CM | POA: Diagnosis not present

## 2018-03-15 DIAGNOSIS — Z79890 Hormone replacement therapy (postmenopausal): Secondary | ICD-10-CM | POA: Insufficient documentation

## 2018-03-15 DIAGNOSIS — Z881 Allergy status to other antibiotic agents status: Secondary | ICD-10-CM | POA: Diagnosis not present

## 2018-03-15 DIAGNOSIS — Z1211 Encounter for screening for malignant neoplasm of colon: Secondary | ICD-10-CM | POA: Insufficient documentation

## 2018-03-15 DIAGNOSIS — Z86010 Personal history of colonic polyps: Secondary | ICD-10-CM | POA: Diagnosis present

## 2018-03-15 DIAGNOSIS — E039 Hypothyroidism, unspecified: Secondary | ICD-10-CM | POA: Insufficient documentation

## 2018-03-15 DIAGNOSIS — I10 Essential (primary) hypertension: Secondary | ICD-10-CM | POA: Diagnosis not present

## 2018-03-15 DIAGNOSIS — Z7902 Long term (current) use of antithrombotics/antiplatelets: Secondary | ICD-10-CM | POA: Diagnosis not present

## 2018-03-15 DIAGNOSIS — Z87891 Personal history of nicotine dependence: Secondary | ICD-10-CM | POA: Insufficient documentation

## 2018-03-15 DIAGNOSIS — D128 Benign neoplasm of rectum: Secondary | ICD-10-CM | POA: Diagnosis not present

## 2018-03-15 HISTORY — PX: POLYPECTOMY: SHX5525

## 2018-03-15 HISTORY — PX: COLONOSCOPY WITH PROPOFOL: SHX5780

## 2018-03-15 SURGERY — COLONOSCOPY WITH PROPOFOL
Anesthesia: Monitor Anesthesia Care

## 2018-03-15 MED ORDER — PROPOFOL 10 MG/ML IV BOLUS
INTRAVENOUS | Status: AC
  Filled 2018-03-15: qty 20

## 2018-03-15 MED ORDER — SODIUM CHLORIDE 0.9 % IV SOLN: INTRAVENOUS | Status: DC

## 2018-03-15 MED ORDER — PROPOFOL 500 MG/50ML IV EMUL
INTRAVENOUS | Status: DC | PRN
  Administered 2018-03-15: 50 mg via INTRAVENOUS

## 2018-03-15 MED ORDER — PROPOFOL 500 MG/50ML IV EMUL
INTRAVENOUS | Status: DC | PRN
  Administered 2018-03-15: 150 ug/kg/min via INTRAVENOUS

## 2018-03-15 MED ORDER — ONDANSETRON HCL 4 MG/2ML IJ SOLN
INTRAMUSCULAR | Status: DC | PRN
  Administered 2018-03-15: 4 mg via INTRAVENOUS

## 2018-03-15 MED ORDER — LACTATED RINGERS IV SOLN
INTRAVENOUS | Status: DC
  Administered 2018-03-15: 10:00:00 via INTRAVENOUS

## 2018-03-15 MED ORDER — PROPOFOL 10 MG/ML IV BOLUS
INTRAVENOUS | Status: AC
  Filled 2018-03-15: qty 40

## 2018-03-15 SURGICAL SUPPLY — 21 items

## 2018-03-15 NOTE — Op Note (Signed)
[facility] Patient Name: Brad Harrell Procedure Date: 03/15/2018 MRN: [id number] Attending MD: Carol Ada , MD Date of Birth: January 23, 1946 CSN: [id number] Age: 72 Admit Type: Outpatient Procedure:                Colonoscopy Indications:              High risk colon cancer surveillance: Personal                            history of colonic polyps Providers:                Carol Ada, MD, Zenon Mayo, RN, Cletis Athens,                            Technician Referring MD:              Medicines:                See the Anesthesia note for documentation of the                            administered medications, Propofol per Anesthesia Complications:            No immediate complications. Estimated Blood Loss:     Estimated blood loss was minimal. Procedure:                Pre-Anesthesia Assessment:                           - Prior to the procedure, a History and Physical                            was performed, and patient medications and                            allergies were reviewed. The patient's tolerance of                            previous anesthesia was also reviewed. The risks                            and benefits of the procedure and the sedation                            options and risks were discussed with the patient.                            All questions were answered, and informed consent                            was obtained. Prior Anticoagulants: The patient has                            taken Plavix (clopidogrel), last dose was 5 days  prior to procedure. ASA Grade Assessment: III - A                            patient with severe systemic disease. After                            reviewing the risks and benefits, the patient was                            deemed in satisfactory condition to undergo the                            procedure.                           - Sedation was administered by an  anesthesia                            professional. Deep sedation was attained.                           After obtaining informed consent, the colonoscope                            was passed under direct vision. Throughout the                            procedure, the patient's blood pressure, pulse, and                            oxygen saturations were monitored continuously. The                            EC-[id number] ([id number]) scope was introduced through                            the anus and advanced to the the cecum, identified                            by appendiceal orifice and ileocecal valve. The                            colonoscopy was performed without difficulty. The                            patient tolerated the procedure well. The quality                            of the bowel preparation was good. The ileocecal                            valve, appendiceal orifice, and rectum were  photographed. Scope In: 9:44:55 AM Scope Out: 10:10:18 AM Total Procedure Duration: 0 hours 25 minutes 23 seconds  Findings:      Six sessile polyps were found in the rectum, hepatic flexure and       ascending colon. The polyps were 2 to 4 mm in size. These polyps were       removed with a cold snare. Resection and retrieval were complete. Impression:               - Six 2 to 4 mm polyps in the rectum, at the                            hepatic flexure and in the ascending colon, removed                            with a cold snare. Resected and retrieved. Moderate Sedation:      N/A- Per Anesthesia Care Recommendation:           - Patient has a contact number available for                            emergencies. The signs and symptoms of potential                            delayed complications were discussed with the                            patient. Return to normal activities tomorrow.                            Written discharge instructions were  provided to the                            patient.                           - Resume previous diet.                           - Continue present medications.                           - Await pathology results.                           - Repeat colonoscopy in 3 years for surveillance.                           - Resume Plavix. Procedure Code(s):        --- Professional ---                           [phone], Colonoscopy, flexible; with removal of                            tumor(s), polyp(s), or other lesion(s) by snare  technique Diagnosis Code(s):        --- Professional ---                           K62.1, Rectal polyp                           Z86.010, Personal history of colonic polyps                           D12.3, Benign neoplasm of transverse colon (hepatic                            flexure or splenic flexure)                           D12.2, Benign neoplasm of ascending colon CPT copyright 2017 American Medical Association. All rights reserved. The codes documented in this report are preliminary and upon coder review may  be revised to meet current compliance requirements. Carol Ada, MD Carol Ada, MD 03/15/2018 10:14:14 AM This report has been signed electronically. Number of Addenda: 0

## 2018-03-15 NOTE — Anesthesia Preprocedure Evaluation (Signed)
Anesthesia Evaluation  Patient identified by MRN, date of birth, ID band Patient awake    Reviewed: Allergy & Precautions, NPO status , Patient's Chart, lab work & pertinent test results, reviewed documented beta blocker date and time   Airway Mallampati: II  TM Distance: >3 FB Neck ROM: Full    Dental  (+) Dental Advisory Given   Pulmonary former smoker,    breath sounds clear to auscultation       Cardiovascular hypertension, Pt. on medications and Pt. on home beta blockers  Rhythm:Regular Rate:Normal     Neuro/Psych CVA    GI/Hepatic negative GI ROS, Neg liver ROS,   Endo/Other  diabetes, Type 2, Oral Hypoglycemic AgentsHypothyroidism   Renal/GU Renal disease     Musculoskeletal   Abdominal   Peds  Hematology negative hematology ROS (+)   Anesthesia Other Findings   Reproductive/Obstetrics                             Anesthesia Physical Anesthesia Plan  ASA: III  Anesthesia Plan: MAC   Post-op Pain Management:    Induction: Intravenous  PONV Risk Score and Plan: 1 and Ondansetron, Propofol infusion and Treatment may vary due to age or medical condition  Airway Management Planned: Natural Airway and Simple Face Mask  Additional Equipment:   Intra-op Plan:   Post-operative Plan:   Informed Consent: I have reviewed the patients History and Physical, chart, labs and discussed the procedure including the risks, benefits and alternatives for the proposed anesthesia with the patient or authorized representative who has indicated his/her understanding and acceptance.     Plan Discussed with: CRNA  Anesthesia Plan Comments:         Anesthesia Quick Evaluation

## 2018-03-15 NOTE — Anesthesia Postprocedure Evaluation (Signed)
Anesthesia Post Note  Patient: Brad Harrell  Procedure(s) Performed: COLONOSCOPY WITH PROPOFOL (N/A ) POLYPECTOMY     Patient location during evaluation: PACU Anesthesia Type: MAC Level of consciousness: awake and alert Pain management: pain level controlled Vital Signs Assessment: post-procedure vital signs reviewed and stable Respiratory status: spontaneous breathing, nonlabored ventilation, respiratory function stable and patient connected to nasal cannula oxygen Cardiovascular status: stable and blood pressure returned to baseline Postop Assessment: no apparent nausea or vomiting Anesthetic complications: no    Last Vitals:  Vitals:   03/15/18 1045 03/15/18 1050  BP: 140/80 130/65  Pulse: 72 66  Resp: 17 16  Temp:    SpO2: 97% 97%    Last Pain:  Vitals:   03/15/18 1045  TempSrc:   PainSc: 0-No pain                 Tiajuana Amass

## 2018-03-15 NOTE — Transfer of Care (Signed)
Immediate Anesthesia Transfer of Care Note  Patient: Brad Harrell  Procedure(s) Performed: COLONOSCOPY WITH PROPOFOL (N/A )  Patient Location: PACU  Anesthesia Type:MAC  Level of Consciousness: awake, alert  and oriented  Airway & Oxygen Therapy: Patient Spontanous Breathing and Patient connected to face mask oxygen  Post-op Assessment: Report given to RN and Post -op Vital signs reviewed and stable  Post vital signs: Reviewed and stable  Last Vitals:  Vitals Value Taken Time  BP 110/55 03/15/2018 10:22 AM  Temp    Pulse 73 03/15/2018 10:24 AM  Resp 19 03/15/2018 10:24 AM  SpO2 100 % 03/15/2018 10:24 AM  Vitals shown include unvalidated device data.  Last Pain:  Vitals:   03/15/18 0919  TempSrc: Oral  PainSc: 0-No pain         Complications: No apparent anesthesia complications

## 2018-03-15 NOTE — Anesthesia Procedure Notes (Signed)
Date/Time: 03/15/2018 9:38 AM Performed by: Glory Buff, CRNA Oxygen Delivery Method: Simple face mask

## 2018-03-15 NOTE — Discharge Instructions (Signed)

## 2018-03-15 NOTE — H&P (Signed)
  Brad Harrell HPI: This year old male presents to the office for colorectal cancer screening. He has BM's per day with no obvious blood or mucus in the stool. He has good appetite and his weight is stable. He denies any complaints of abdominal pain, nausea, vomiting, acid reflux, dysphagia or odynophagia. He denies having a family history of colon cancer, celiac sprue, or IBD. He had blood work performed on 11/11/2017 and it was essentially normal outside of an elevated blood sugar of 216. His HGB was at 13.6 g/dL. He has a significant history of CHF and his last echo was on 07/01/2014 with an EF of 30-35%. His colonoscopy on 03/11/2013 was positive for a 1 cm hepatic flexure adenoma and he was recommended a 3 year follow up colonoscopy.   Past Medical History:  Diagnosis Date  . Anemia    Iron Defficiency  . Cardiomyopathy ([place])   . Combined systolic and diastolic congestive heart failure ([place])   . Diabetes mellitus without complication ([place])    TYPE 2  . Foley catheter in place    SINCE NOV 2017  . H/O cocaine abuse   . Hyperlipidemia   . Hypertension   . Hypothyroidism   . Liver abscess    measuring 7 x 9 cm/notes 03/04/2013  . Stroke ([place]) 2010   WEAK ON BOTH SIDES NOW TOTAL OF 3 STROKES   . Urinary retention     Past Surgical History:  Procedure Laterality Date  . APPENDECTOMY    . COLONOSCOPY N/A 03/11/2013   Procedure: COLONOSCOPY;  Surgeon: Beryle Beams, MD;  Location: [facility];  Service: Endoscopy;  Laterality: N/A;  . CYSTOSCOPY WITH INSERTION OF UROLIFT N/A 01/04/2017   Procedure: CYSTOSCOPY WITH INSERTION OF UROLIFT x8;  Surgeon: Cleon Gustin, MD;  [facility];  Service: Urology;  Laterality: N/A;  . ESOPHAGOGASTRODUODENOSCOPY N/A 03/11/2013   Procedure: ESOPHAGOGASTRODUODENOSCOPY (EGD);  Surgeon: Beryle Beams, MD;  [facility];  Service: Endoscopy;  Laterality: N/A;  . GIVENS CAPSULE STUDY N/A 03/12/2013   Procedure: GIVENS CAPSULE STUDY;   Surgeon: Beryle Beams, MD;  Location: [facility];  Service: Endoscopy;  Laterality: N/A;  . INGUINAL HERNIA REPAIR Right   . TIBIA FRACTURE SURGERY Left     Family History  Problem Relation Age of Onset  . Hypertension Mother   . Hypertension Father   . Diabetes type II Father   . Hypertension Brother     Social History:  reports that he has quit smoking. His smoking use included cigarettes. He has a 51.00 pack-year smoking history. He has never used smokeless tobacco. He reports that he has current or past drug history. Drug: Cocaine. He reports that he does not drink alcohol.  Allergies:  Allergies  Allergen Reactions  . Rocephin [Ceftriaxone] Nausea And Vomiting    Medications:  Scheduled:  Continuous: . sodium chloride    . lactated ringers      No results found for this or any previous visit (from the past 24 hour(s)).   No results found.  ROS:  As stated above in the HPI otherwise negative.  There were no vitals taken for this visit.    PE: Gen: NAD, Alert and Oriented HEENT:  [place]/AT, EOMI Neck: Supple, no LAD Lungs: CTA Bilaterally CV: RRR without M/G/R ABM: Soft, NTND, +BS Ext: No C/C/E  Assessment/Plan: 1) Personal history of polyp - Colonoscopy.  Brad Harrell D 03/15/2018, 9:15 AM

## 2018-03-18 ENCOUNTER — Encounter (HOSPITAL_COMMUNITY): Payer: Self-pay | Admitting: Gastroenterology

## 2019-12-20 ENCOUNTER — Ambulatory Visit: Payer: Medicare Other | Attending: Internal Medicine

## 2019-12-20 DIAGNOSIS — Z23 Encounter for immunization: Secondary | ICD-10-CM | POA: Insufficient documentation

## 2019-12-20 NOTE — Progress Notes (Signed)
   Covid-19 Vaccination Clinic  Name:  Brad Harrell    MRN: [id number] DOB: [birth year]  12/20/2019  [name] was observed post Covid-19 immunization for 30 minutes based on pre-vaccination screening without incidence. He was provided with Vaccine Information Sheet and instruction to access the V-Safe system.   [name] was instructed to call 911 with any severe reactions post vaccine: Marland Kitchen Difficulty breathing  . Swelling of your face and throat  . A fast heartbeat  . A bad rash all over your body  . Dizziness and weakness    Immunizations Administered    Name Date Dose VIS Date Route   Pfizer COVID-19 Vaccine 12/20/2019 10:20 AM 0.3 mL 10/10/2019 Intramuscular   Manufacturer: [place]   Lot: [id number]   [place]: [id number]

## 2020-01-13 ENCOUNTER — Ambulatory Visit: Payer: Medicare Other | Attending: Internal Medicine

## 2020-01-13 DIAGNOSIS — Z23 Encounter for immunization: Secondary | ICD-10-CM

## 2020-01-13 NOTE — Progress Notes (Signed)
   Covid-19 Vaccination Clinic  Name:  QUENTION SCURA    MRN: [id number] DOB: 1946/10/16  01/13/2020  Mr. Freedman was observed post Covid-19 immunization for 15 minutes without incident. He was provided with Vaccine Information Sheet and instruction to access the V-Safe system.   Mr. Crull was instructed to call 911 with any severe reactions post vaccine: Marland Kitchen Difficulty breathing  . Swelling of face and throat  . A fast heartbeat  . A bad rash all over body  . Dizziness and weakness   Immunizations Administered    Name Date Dose VIS Date Route   Pfizer COVID-19 Vaccine 01/13/2020 10:49 AM 0.3 mL 10/10/2019 Intramuscular   Manufacturer: [place]   Lot: [id number]   [place]: [id number]

## 2020-10-10 ENCOUNTER — Other Ambulatory Visit: Payer: Self-pay | Admitting: Urology

## 2021-01-04 ENCOUNTER — Other Ambulatory Visit: Payer: Self-pay | Admitting: Urology

## 2021-03-21 ENCOUNTER — Emergency Department (HOSPITAL_COMMUNITY): Payer: Medicare Other

## 2021-03-21 ENCOUNTER — Other Ambulatory Visit: Payer: Self-pay

## 2021-03-21 ENCOUNTER — Inpatient Hospital Stay (HOSPITAL_COMMUNITY)
Admission: EM | Admit: 2021-03-21 | Discharge: 2021-03-30 | DRG: 854 | Disposition: A | Discharge status: Skilled Nursing Facility | Payer: Medicare Other | Attending: Internal Medicine | Admitting: Internal Medicine

## 2021-03-21 ENCOUNTER — Encounter (HOSPITAL_COMMUNITY): Payer: Self-pay | Admitting: Emergency Medicine

## 2021-03-21 DIAGNOSIS — E860 Dehydration: Secondary | ICD-10-CM

## 2021-03-21 DIAGNOSIS — Z6829 Body mass index (BMI) 29.0-29.9, adult: Secondary | ICD-10-CM

## 2021-03-21 DIAGNOSIS — E039 Hypothyroidism, unspecified: Secondary | ICD-10-CM | POA: Diagnosis present

## 2021-03-21 DIAGNOSIS — Z8673 Personal history of transient ischemic attack (TIA), and cerebral infarction without residual deficits: Secondary | ICD-10-CM

## 2021-03-21 DIAGNOSIS — A419 Sepsis, unspecified organism: Secondary | ICD-10-CM | POA: Diagnosis not present

## 2021-03-21 DIAGNOSIS — N453 Epididymo-orchitis: Secondary | ICD-10-CM | POA: Diagnosis present

## 2021-03-21 DIAGNOSIS — E119 Type 2 diabetes mellitus without complications: Secondary | ICD-10-CM | POA: Diagnosis present

## 2021-03-21 DIAGNOSIS — Z20822 Contact with and (suspected) exposure to covid-19: Secondary | ICD-10-CM | POA: Diagnosis present

## 2021-03-21 DIAGNOSIS — I429 Cardiomyopathy, unspecified: Secondary | ICD-10-CM | POA: Diagnosis present

## 2021-03-21 DIAGNOSIS — R338 Other retention of urine: Secondary | ICD-10-CM | POA: Diagnosis present

## 2021-03-21 DIAGNOSIS — Z7982 Long term (current) use of aspirin: Secondary | ICD-10-CM

## 2021-03-21 DIAGNOSIS — N320 Bladder-neck obstruction: Secondary | ICD-10-CM | POA: Diagnosis present

## 2021-03-21 DIAGNOSIS — N401 Enlarged prostate with lower urinary tract symptoms: Secondary | ICD-10-CM | POA: Diagnosis present

## 2021-03-21 DIAGNOSIS — N5089 Other specified disorders of the male genital organs: Secondary | ICD-10-CM

## 2021-03-21 DIAGNOSIS — E440 Moderate protein-calorie malnutrition: Secondary | ICD-10-CM | POA: Diagnosis present

## 2021-03-21 DIAGNOSIS — N138 Other obstructive and reflux uropathy: Secondary | ICD-10-CM

## 2021-03-21 DIAGNOSIS — N5082 Scrotal pain: Secondary | ICD-10-CM

## 2021-03-21 DIAGNOSIS — R6520 Severe sepsis without septic shock: Secondary | ICD-10-CM | POA: Diagnosis present

## 2021-03-21 DIAGNOSIS — E785 Hyperlipidemia, unspecified: Secondary | ICD-10-CM | POA: Diagnosis present

## 2021-03-21 DIAGNOSIS — I959 Hypotension, unspecified: Secondary | ICD-10-CM | POA: Diagnosis present

## 2021-03-21 DIAGNOSIS — I5042 Chronic combined systolic (congestive) and diastolic (congestive) heart failure: Secondary | ICD-10-CM | POA: Diagnosis present

## 2021-03-21 DIAGNOSIS — I77810 Thoracic aortic ectasia: Secondary | ICD-10-CM | POA: Diagnosis present

## 2021-03-21 DIAGNOSIS — E875 Hyperkalemia: Secondary | ICD-10-CM | POA: Diagnosis present

## 2021-03-21 DIAGNOSIS — Z881 Allergy status to other antibiotic agents status: Secondary | ICD-10-CM

## 2021-03-21 DIAGNOSIS — R339 Retention of urine, unspecified: Secondary | ICD-10-CM | POA: Diagnosis present

## 2021-03-21 DIAGNOSIS — G4700 Insomnia, unspecified: Secondary | ICD-10-CM | POA: Diagnosis present

## 2021-03-21 DIAGNOSIS — N179 Acute kidney failure, unspecified: Secondary | ICD-10-CM | POA: Diagnosis present

## 2021-03-21 DIAGNOSIS — R32 Unspecified urinary incontinence: Secondary | ICD-10-CM | POA: Diagnosis present

## 2021-03-21 DIAGNOSIS — Z79899 Other long term (current) drug therapy: Secondary | ICD-10-CM

## 2021-03-21 DIAGNOSIS — N289 Disorder of kidney and ureter, unspecified: Secondary | ICD-10-CM

## 2021-03-21 DIAGNOSIS — I110 Hypertensive heart disease with heart failure: Secondary | ICD-10-CM | POA: Diagnosis present

## 2021-03-21 DIAGNOSIS — N492 Inflammatory disorders of scrotum: Secondary | ICD-10-CM | POA: Diagnosis present

## 2021-03-21 DIAGNOSIS — N433 Hydrocele, unspecified: Secondary | ICD-10-CM | POA: Diagnosis present

## 2021-03-21 DIAGNOSIS — Z87891 Personal history of nicotine dependence: Secondary | ICD-10-CM

## 2021-03-21 DIAGNOSIS — Z79890 Hormone replacement therapy (postmenopausal): Secondary | ICD-10-CM

## 2021-03-21 DIAGNOSIS — D62 Acute posthemorrhagic anemia: Secondary | ICD-10-CM | POA: Diagnosis not present

## 2021-03-21 DIAGNOSIS — Z7902 Long term (current) use of antithrombotics/antiplatelets: Secondary | ICD-10-CM

## 2021-03-21 LAB — I-STAT CHEM 8, ED
BUN: 32 mg/dL — ABNORMAL HIGH (ref 8–23)
Calcium, Ion: 1.25 mmol/L (ref 1.15–1.40)
Chloride: 103 mmol/L (ref 98–111)
Creatinine, Ser: 1.4 mg/dL — ABNORMAL HIGH (ref 0.61–1.24)
Glucose, Bld: 127 mg/dL — ABNORMAL HIGH (ref 70–99)
HCT: 33 % — ABNORMAL LOW (ref 39.0–52.0)
Hemoglobin: 11.2 g/dL — ABNORMAL LOW (ref 13.0–17.0)
Potassium: 5.7 mmol/L — ABNORMAL HIGH (ref 3.5–5.1)
Sodium: 136 mmol/L (ref 135–145)
TCO2: 21 mmol/L — ABNORMAL LOW (ref 22–32)

## 2021-03-21 LAB — BASIC METABOLIC PANEL
Anion gap: 15 (ref 5–15)
BUN: 33 mg/dL — ABNORMAL HIGH (ref 8–23)
CO2: 21 mmol/L — ABNORMAL LOW (ref 22–32)
Calcium: 9.8 mg/dL (ref 8.9–10.3)
Chloride: 99 mmol/L (ref 98–111)
Creatinine, Ser: 1.7 mg/dL — ABNORMAL HIGH (ref 0.61–1.24)
GFR, Estimated: 41 mL/min — ABNORMAL LOW (ref >60)
Glucose, Bld: 130 mg/dL — ABNORMAL HIGH (ref 70–99)
Potassium: 6.2 mmol/L — ABNORMAL HIGH (ref 3.5–5.1)
Sodium: 135 mmol/L (ref 135–145)

## 2021-03-21 LAB — CBC WITH DIFFERENTIAL/PLATELET
Abs Immature Granulocytes: 0.22 10*3/uL — ABNORMAL HIGH (ref 0.00–0.07)
Basophils Absolute: 0 10*3/uL (ref 0.0–0.1)
Basophils Relative: 0 %
Eosinophils Absolute: 0.1 10*3/uL (ref 0.0–0.5)
Eosinophils Relative: 0 %
HCT: 32.5 % — ABNORMAL LOW (ref 39.0–52.0)
Hemoglobin: 10 g/dL — ABNORMAL LOW (ref 13.0–17.0)
Immature Granulocytes: 1 %
Lymphocytes Relative: 11 %
Lymphs Abs: 1.8 10*3/uL (ref 0.7–4.0)
MCH: 24.9 pg — ABNORMAL LOW (ref 26.0–34.0)
MCHC: 30.8 g/dL (ref 30.0–36.0)
MCV: 80.8 fL (ref 80.0–100.0)
Monocytes Absolute: 1 10*3/uL (ref 0.1–1.0)
Monocytes Relative: 7 %
Neutro Abs: 12.6 10*3/uL — ABNORMAL HIGH (ref 1.7–7.7)
Neutrophils Relative %: 81 %
Platelets: 344 10*3/uL (ref 150–400)
RBC: 4.02 MIL/uL — ABNORMAL LOW (ref 4.22–5.81)
RDW: 17.6 % — ABNORMAL HIGH (ref 11.5–15.5)
WBC: 15.7 10*3/uL — ABNORMAL HIGH (ref 4.0–10.5)
nRBC: 0 % (ref 0.0–0.2)

## 2021-03-21 LAB — RESP PANEL BY RT-PCR (FLU A&B, COVID) ARPGX2
Influenza A by PCR: NEGATIVE
Influenza B by PCR: NEGATIVE
SARS Coronavirus 2 by RT PCR: NEGATIVE

## 2021-03-21 LAB — URINALYSIS, ROUTINE W REFLEX MICROSCOPIC
Bilirubin Urine: NEGATIVE
Glucose, UA: >=500 mg/dL — AB
Ketones, ur: 20 mg/dL — AB
Nitrite: NEGATIVE
Protein, ur: >=300 mg/dL — AB
Specific Gravity, Urine: 1.022 (ref 1.005–1.030)
WBC, UA: >50 WBC/hpf — ABNORMAL HIGH (ref 0–5)
pH: 5 (ref 5.0–8.0)

## 2021-03-21 LAB — PROTIME-INR
INR: 1.1 (ref 0.8–1.2)
Prothrombin Time: 14.3 seconds (ref 11.4–15.2)

## 2021-03-21 LAB — LACTIC ACID, PLASMA: Lactic Acid, Venous: 1.7 mmol/L (ref 0.5–1.9)

## 2021-03-21 MED ORDER — SODIUM CHLORIDE 0.9 % IV BOLUS
1000.0000 mL | Freq: Once | INTRAVENOUS | Status: AC
  Administered 2021-03-21: 1000 mL via INTRAVENOUS

## 2021-03-21 NOTE — ED Provider Notes (Signed)
Emergency Medicine Provider Triage Evaluation Note  Brad Harrell , a 75 y.o. male  was evaluated in triage.  Pt complains of left testicle swelling for about 5 days.  Reports urinary frequency.  Review of Systems  Positive: Left testicle pain and swelling Negative: Hematuria  Physical Exam  BP (!) 157/85 (BP Location: Left Arm)   Pulse 76   Temp 98.7 F (37.1 C) (Oral)   Resp 16   SpO2 99%  Gen:   Awake, no distress   Resp:  Normal effort  MSK:   Moves extremities without difficulty  Other:  Abdomen is soft  Medical Decision Making  Medically screening exam initiated at 3:09 PM.  Appropriate orders placed.  Brad Harrell was informed that the remainder of the evaluation will be completed by another provider, this initial triage assessment does not replace that evaluation, and the importance of remaining in the ED until their evaluation is complete.  Will order ultrasound and urinalysis.   Delia Heady, PA-C 03/21/21 [place], [place], DO 03/21/21 1614

## 2021-03-21 NOTE — ED Provider Notes (Incomplete)
[facility] EMERGENCY DEPARTMENT Provider Note   CSN: [id number] Arrival date & time: 03/21/21  1337     History No chief complaint on file.   Brad Harrell is a 75 y.o. male.  75 year old male with prior medical history as detailed below presents for evaluation.  Patient complains of gradually worsening pain and swelling to the left aspect of his scrotum.  Symptoms started 1 week ago.  Patient had a virtual visit with a provider last week who prescribed him pain medicine and Cipro.  He has been taking Cipro since Friday without improvement in his symptoms.  He complains of increasing swelling and tenderness to the left side of his scrotum.  He denies fever.  He denies specific trauma or other inciting event.  The history is provided by the patient and medical records.  Illness Location:  Left sided scrotal pain and swelling  Severity:  Moderate Onset quality:  Gradual Duration:  1 week Timing:  Constant Progression:  Worsening Chronicity:  New Associated symptoms: no abdominal pain and no fever        Past Medical History:  Diagnosis Date  . Anemia    Iron Defficiency  . Cardiomyopathy ([place])   . 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 Diabetes mellitus without complication ([place])    TYPE 2  . Foley catheter in place    SINCE NOV 2017  . H/O cocaine abuse ([place])   . Hyperlipidemia   . Hypertension   . Hypothyroidism   . Liver abscess    measuring 7 x 9 cm/notes 03/04/2013  . Stroke ([place]) 2010   WEAK ON BOTH SIDES NOW TOTAL OF 3 STROKES   . Urinary retention     Patient Active Problem List   Diagnosis Date Noted  . Hypothermia 08/21/2016  . Metabolic acidosis 27/03/2375  . Elevated lactic acid level 08/21/2016  . 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 Diabetes mellitus, new onset ([place]) 07/01/2014  . Left arm weakness 07/01/2014  . History of CVA  07/01/2014  . Urinary tract infection  07/01/2014  . Thrombocytopenia ([place]) 07/01/2014  . DKA (diabetic ketoacidoses) 06/30/2014  . Physical deconditioning 03/13/2013  . Anemia, iron deficiency 03/08/2013  . Bacteremia 03/06/2013  . History of Liver abscess 03/05/2013  . Encephalopathy, metabolic 28/31/5176  . Anemia 03/05/2013  . Complicated UTI (urinary tract infection) 08/15/2012  . SIRS (systemic inflammatory response syndrome) ([place]) 08/15/2012  . ARF (acute renal failure) ([place]) 08/15/2012  . Hypertension   . Urinary retention   . Stroke ([place])   . Hyperlipidemia   . Cardiomyopathy ([place])   . H/O cocaine abuse [facility])     Past Surgical History:  Procedure Laterality Date  . APPENDECTOMY    . COLONOSCOPY N/A 03/11/2013   Procedure: COLONOSCOPY;  Surgeon: Beryle Beams, MD;  Location: [facility];  Service: Endoscopy;  Laterality: N/A;  . COLONOSCOPY WITH PROPOFOL N/A 03/15/2018   Procedure: COLONOSCOPY WITH PROPOFOL;  Surgeon: Carol Ada, MD;  Location: [facility] ENDOSCOPY;  Service: Endoscopy;  Laterality: N/A;  . CYSTOSCOPY WITH INSERTION OF UROLIFT N/A 01/04/2017   Procedure: CYSTOSCOPY WITH INSERTION OF UROLIFT x8;  Surgeon: Cleon Gustin, MD;  Location: [facility];  Service: Urology;  Laterality: N/A;  . ESOPHAGOGASTRODUODENOSCOPY N/A 03/11/2013   Procedure: ESOPHAGOGASTRODUODENOSCOPY (EGD);  Surgeon: Beryle Beams, MD;  Location[facility] ENDOSCOPY;  Service: Endoscopy;  Laterality: N/A;  . GIVENS CAPSULE STUDY N/A 03/12/2013   Procedure:  GIVENS CAPSULE STUDY;  Surgeon: Beryle Beams, MD;  Location: [facility] ENDOSCOPY;  Service: Endoscopy;  Laterality: N/A;  . INGUINAL HERNIA REPAIR Right   . POLYPECTOMY  03/15/2018   Procedure: POLYPECTOMY;  Surgeon: Carol Ada, MD;  Location: [facility] ENDOSCOPY;  Service: Endoscopy;;  . TIBIA FRACTURE SURGERY Left        Family History  Problem Relation Age of Onset  . Hypertension Mother   . Hypertension Father   . Diabetes type II Father   . Hypertension Brother     Social History    Tobacco Use  . Smoking status: Former Smoker    Packs/day: 1.00    Years: 51.00    Pack years: 51.00    Types: Cigarettes  . Smokeless tobacco: Never Used  . Tobacco comment: QUIT NOV 2010  Vaping Use  . Vaping Use: Never used  Substance Use Topics  . Alcohol use: No    Comment: NOV 2010  . Drug use: Yes    Types: Cocaine    Comment: NOV 2010 LAST USED    Home Medications Prior to Admission medications   Medication Sig Start Date End Date Taking? Authorizing Provider  acetaminophen (TYLENOL) 325 MG tablet Take 2 tablets (650 mg total) by mouth every 6 (six) hours as needed for mild pain (or Fever >/= 101). 08/21/16   Geradine Girt, DO  aspirin EC 81 MG tablet Take 81 mg by mouth daily.    [provider]  atorvastatin (LIPITOR) 20 MG tablet TAKE ONE TABLET BY MOUTH DAILY Patient taking differently: TAKE 20 MG BY MOUTH DAILY AT BEDTIME. 09/07/14   Reed, Tiffany L, DO  carvedilol (COREG) 3.125 MG tablet Take 3.125 mg by mouth 2 (two) times daily. 11/11/16   [provider]  clopidogrel (PLAVIX) 75 MG tablet Take 75 mg by mouth daily.    [provider]  cyclobenzaprine (FLEXERIL) 10 MG tablet Take 10 mg by mouth 3 (three) times daily as needed for muscle spasms.    [provider]  doxazosin (CARDURA) 8 MG tablet Take 8 mg by mouth at bedtime.     [provider]  glucose blood (FREESTYLE LITE) test strip Use as instructed Patient taking differently: 1 each by Other route daily. Use as instructed 07/31/14   Blanchie Serve, MD  glucose monitoring kit (FREESTYLE) monitoring kit 1 each by Does not apply route as needed for other. For blood sugar check Patient taking differently: 1 each by Does not apply route daily. For blood sugar check 07/31/14   Blanchie Serve, MD  ibuprofen (ADVIL,MOTRIN) 600 MG tablet Take 1 tablet (600 mg total) by mouth every 8 (eight) hours as needed. 11/11/17   Jola Schmidt, MD  Lancets 28G MISC To check blood sugar  three times daily with meals and at bedtime Patient taking differently: 1 strip by Other route daily.  07/31/14   Blanchie Serve, MD  levothyroxine (SYNTHROID, LEVOTHROID) 100 MCG tablet Take 100 mcg by mouth daily before breakfast.     [provider]  metFORMIN (GLUCOPHAGE) 500 MG tablet Take 500 mg by mouth daily with breakfast.     [provider]  tamsulosin (FLOMAX) 0.4 MG CAPS capsule Take 1 capsule by mouth twice daily 01/10/21   McKenzie, Candee Furbish, MD  traMADol (ULTRAM) 50 MG tablet Take 1 tablet (50 mg total) by mouth every 6 (six) hours as needed. 01/04/17   McKenzie, Candee Furbish, MD    Allergies    Rocephin [ceftriaxone]  Review of Systems   Review of Systems  Constitutional: Negative for fever.  Gastrointestinal: Negative for abdominal pain.  All other systems reviewed and are negative.   Physical Exam Updated Vital Signs BP 119/80 (BP Location: Right Arm)   Pulse 78   Temp 98.7 F (37.1 C) (Oral)   Resp 18   SpO2 97%   Physical Exam Vitals and nursing note reviewed.  Constitutional:      General: He is not in acute distress.    Appearance: Normal appearance. He is well-developed.  HENT:     Head: Normocephalic and atraumatic.  Eyes:     Conjunctiva/sclera: Conjunctivae normal.     Pupils: Pupils are equal, round, and reactive to light.  Cardiovascular:     Rate and Rhythm: Normal rate and regular rhythm.     Heart sounds: Normal heart sounds.  Pulmonary:     Effort: Pulmonary effort is normal. No respiratory distress.     Breath sounds: Normal breath sounds.  Abdominal:     General: There is no distension.     Palpations: Abdomen is soft.     Tenderness: There is no abdominal tenderness.  Genitourinary:    Comments: Left-sided tenderness of the scrotum noted.  Significant edema on the leftward aspect of the scrotum itself.  Minimal overlying erythema.  No drainage noted.  No crepitus noted. Musculoskeletal:        General: No deformity.  Normal range of motion.     Cervical back: Normal range of motion and neck supple.  Skin:    General: Skin is warm and dry.  Neurological:     General: No focal deficit present.     Mental Status: He is alert and oriented to person, place, and time.     ED Results / Procedures / Treatments   Labs (all labs ordered are listed, but only abnormal results are displayed) Labs Reviewed  URINALYSIS, ROUTINE W REFLEX MICROSCOPIC - Abnormal; Notable for the following components:      Result Value   Color, Urine AMBER (*)    APPearance CLOUDY (*)    Glucose, UA >=500 (*)    Hgb urine dipstick MODERATE (*)    Ketones, ur 20 (*)    Protein, ur >=300 (*)    Leukocytes,Ua LARGE (*)    WBC, UA >50 (*)    Bacteria, UA RARE (*)    All other components within normal limits  BASIC METABOLIC PANEL - Abnormal; Notable for the following components:   Potassium 6.2 (*)    CO2 21 (*)    Glucose, Bld 130 (*)    BUN 33 (*)    Creatinine, Ser 1.70 (*)    GFR, Estimated 41 (*)    All other components within normal limits  CBC WITH DIFFERENTIAL/PLATELET - Abnormal; Notable for the following components:   WBC 15.7 (*)    RBC 4.02 (*)    Hemoglobin 10.0 (*)    HCT 32.5 (*)    MCH 24.9 (*)    RDW 17.6 (*)    Neutro Abs 12.6 (*)    Abs Immature Granulocytes 0.22 (*)    All other components within normal limits  I-STAT CHEM 8, ED - Abnormal; Notable for the following components:   Potassium 5.7 (*)    BUN 32 (*)    Creatinine, Ser 1.40 (*)    Glucose, Bld 127 (*)    TCO2 21 (*)    Hemoglobin 11.2 (*)    HCT 33.0 (*)  All other components within normal limits  RESP PANEL BY RT-PCR (FLU A&B, COVID) ARPGX2  URINE CULTURE  PROTIME-INR  LACTIC ACID, PLASMA  LACTIC ACID, PLASMA  CBG MONITORING, ED    EKG None  Radiology US SCROTUM W/DOPPLER  Result Date: 03/21/2021 CLINICAL DATA:  Left testicular swelling EXAM: SCROTAL ULTRASOUND DOPPLER ULTRASOUND OF THE TESTICLES TECHNIQUE: Complete  ultrasound examination of the testicles, epididymis, and other scrotal structures was performed. Color and spectral Doppler ultrasound were also utilized to evaluate blood flow to the testicles. COMPARISON:  CT 11/11/2017 FINDINGS: Right testicle Measurements: 3.2 x 2.6 x 2.2 cm. No mass or microlithiasis visualized. Left testicle Measurements: 3.5 x 2.0 x 2.9 cm. Testicular parenchyma appears somewhat distorted due to compression what appears to be and heterogeneous, solid/cystic paratesticular lentiform lesion or collection measuring 3.9 x 1.3 x 3.3 cm in size. A separate more complex solid/cystic lesion is inferior to the left testicle as well measuring 2.4 x 3 x 3.1 cm in size lobular without internal color Doppler flow. Right epididymis:  Poorly visualized Left epididymis: Poorly visualized portions appear heterogeneous possibly thickened. Hydrocele: Moderate right hydrocele with low level internal echoes/debris. Areas has a more solid liver moderate Varicocele:  None visualized. Pulsed Doppler interrogation of both testes demonstrates normal low resistance arterial and venous waveforms bilaterally. IMPRESSION: Complex layering paratesticular collection versus cystic lesion around the left testicle some compression of the cystic liver parenchyma additional separate paratesticular heterogeneous mass without internal color vascularity is noted inferior to the left testicle as well. Recommend correlation clinical findings as hematoma or paratesticular abscess could have these appearances. Some questionable thickening and heterogeneity of the left epididymis could reflect epididymitis as well. Moderate right hydrocele with low-level internal echoes, can reflect chronicity versus superinfection as well. Electronically Signed   By: Lovena Le M.D.   On: 03/21/2021 20:05    Procedures Procedures {Remember to document critical care time when appropriate:1}  Medications Ordered in ED Medications - No data to  display  ED Course  I have reviewed the triage vital signs and the nursing notes.  Pertinent labs & imaging results that were available during my care of the patient were reviewed by me and considered in my medical decision making (see chart for details).    MDM Rules/Calculators/A&P                          MDM  MSE complete  Brad Harrell was evaluated in Emergency Department on 03/21/2021 for the symptoms described in the history of present illness. He was evaluated in the context of the global COVID-19 pandemic, which necessitated consideration that the patient might be at risk for infection with the SARS-CoV-2 virus that causes COVID-19. Institutional protocols and algorithms that pertain to the evaluation of patients at risk for COVID-19 are in a state of rapid change based on information released by regulatory bodies including the CDC and federal and state organizations. These policies and algorithms were followed during the patient's care in the ED.   Patient is presenting with left-sided scrotal pain and swelling over the last 7 days.  Symptoms have worsened despite being on Cipro for the last 4 days.  Labs are notable for evidence of likely UTI with mildly bumped creatinine and mildly elevated potassium.  EKG is without evidence of significant effects from hyperkalemia.  Imaging of the scrotum is concerning for possible early scrotal abscess.  Case discussed with Dr. Jeffie Pollock who is covering urology  overnight.  He agrees with plan for IV antibiotics.  He requests hospitalist admission.  He will evaluate the patient in the morning.  Patient is to be remain n.p.o. after midnight for possible operative intervention.  Hospitalist service aware of case and will evaluate for admission. Final Clinical Impression(s) / ED Diagnoses Final diagnoses:  Swelling of left testicle    Rx / DC Orders ED Discharge Orders    None

## 2021-03-21 NOTE — ED Triage Notes (Signed)
Pt here with some swelling noted to the left side of his testicle just started on pain meds and cipro

## 2021-03-21 NOTE — ED Provider Notes (Signed)
[facility] EMERGENCY DEPARTMENT Provider Note   CSN: [id number] Arrival date & time: 03/21/21  1337     History No chief complaint on file.   RAMAJ FRANGOS is a 75 y.o. male.  75 year old male with prior medical history as detailed below presents for evaluation.  Patient complains of gradually worsening pain and swelling to the left aspect of his scrotum.  Symptoms started 1 week ago.  Patient had a virtual visit with a provider last week who prescribed him pain medicine and Cipro.  He has been taking Cipro since Friday without improvement in his symptoms.  He complains of increasing swelling and tenderness to the left side of his scrotum.  He denies fever.  He denies specific trauma or other inciting event.  The history is provided by the patient and medical records.  Illness Location:  Left sided scrotal pain and swelling  Severity:  Moderate Onset quality:  Gradual Duration:  1 week Timing:  Constant Progression:  Worsening Chronicity:  New Associated symptoms: no abdominal pain and no fever        Past Medical History:  Diagnosis Date  . Anemia    Iron Defficiency  . Cardiomyopathy ([place])   . 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 Diabetes mellitus without complication ([place])    TYPE 2  . Foley catheter in place    SINCE NOV 2017  . H/O cocaine abuse ([place])   . Hyperlipidemia   . Hypertension   . Hypothyroidism   . Liver abscess    measuring 7 x 9 cm/notes 03/04/2013  . Stroke ([place]) 2010   WEAK ON BOTH SIDES NOW TOTAL OF 3 STROKES   . Urinary retention     Patient Active Problem List   Diagnosis Date Noted  . Hypothermia 08/21/2016  . Metabolic acidosis 27/03/2375  . Elevated lactic acid level 08/21/2016  . 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 Diabetes mellitus, new onset ([place]) 07/01/2014  . Left arm weakness 07/01/2014  . History of CVA  07/01/2014  . Urinary tract infection  07/01/2014  . Thrombocytopenia ([place]) 07/01/2014  . DKA (diabetic ketoacidoses) 06/30/2014  . Physical deconditioning 03/13/2013  . Anemia, iron deficiency 03/08/2013  . Bacteremia 03/06/2013  . History of Liver abscess 03/05/2013  . Encephalopathy, metabolic 28/31/5176  . Anemia 03/05/2013  . Complicated UTI (urinary tract infection) 08/15/2012  . SIRS (systemic inflammatory response syndrome) ([place]) 08/15/2012  . ARF (acute renal failure) ([place]) 08/15/2012  . Hypertension   . Urinary retention   . Stroke ([place])   . Hyperlipidemia   . Cardiomyopathy ([place])   . H/O cocaine abuse [facility])     Past Surgical History:  Procedure Laterality Date  . APPENDECTOMY    . COLONOSCOPY N/A 03/11/2013   Procedure: COLONOSCOPY;  Surgeon: Beryle Beams, MD;  Location: [facility];  Service: Endoscopy;  Laterality: N/A;  . COLONOSCOPY WITH PROPOFOL N/A 03/15/2018   Procedure: COLONOSCOPY WITH PROPOFOL;  Surgeon: Carol Ada, MD;  Location: [facility] ENDOSCOPY;  Service: Endoscopy;  Laterality: N/A;  . CYSTOSCOPY WITH INSERTION OF UROLIFT N/A 01/04/2017   Procedure: CYSTOSCOPY WITH INSERTION OF UROLIFT x8;  Surgeon: Cleon Gustin, MD;  Location: [facility];  Service: Urology;  Laterality: N/A;  . ESOPHAGOGASTRODUODENOSCOPY N/A 03/11/2013   Procedure: ESOPHAGOGASTRODUODENOSCOPY (EGD);  Surgeon: Beryle Beams, MD;  Location[facility] ENDOSCOPY;  Service: Endoscopy;  Laterality: N/A;  . GIVENS CAPSULE STUDY N/A 03/12/2013   Procedure:  GIVENS CAPSULE STUDY;  Surgeon: Beryle Beams, MD;  Location: [facility] ENDOSCOPY;  Service: Endoscopy;  Laterality: N/A;  . INGUINAL HERNIA REPAIR Right   . POLYPECTOMY  03/15/2018   Procedure: POLYPECTOMY;  Surgeon: Carol Ada, MD;  Location: [facility] ENDOSCOPY;  Service: Endoscopy;;  . TIBIA FRACTURE SURGERY Left        Family History  Problem Relation Age of Onset  . Hypertension Mother   . Hypertension Father   . Diabetes type II Father   . Hypertension Brother     Social History    Tobacco Use  . Smoking status: Former Smoker    Packs/day: 1.00    Years: 51.00    Pack years: 51.00    Types: Cigarettes  . Smokeless tobacco: Never Used  . Tobacco comment: QUIT NOV 2010  Vaping Use  . Vaping Use: Never used  Substance Use Topics  . Alcohol use: No    Comment: NOV 2010  . Drug use: Yes    Types: Cocaine    Comment: NOV 2010 LAST USED    Home Medications Prior to Admission medications   Medication Sig Start Date End Date Taking? Authorizing Provider  acetaminophen (TYLENOL) 325 MG tablet Take 2 tablets (650 mg total) by mouth every 6 (six) hours as needed for mild pain (or Fever >/= 101). 08/21/16   Geradine Girt, DO  aspirin EC 81 MG tablet Take 81 mg by mouth daily.    [provider]  atorvastatin (LIPITOR) 20 MG tablet TAKE ONE TABLET BY MOUTH DAILY Patient taking differently: TAKE 20 MG BY MOUTH DAILY AT BEDTIME. 09/07/14   Reed, Tiffany L, DO  carvedilol (COREG) 3.125 MG tablet Take 3.125 mg by mouth 2 (two) times daily. 11/11/16   [provider]  clopidogrel (PLAVIX) 75 MG tablet Take 75 mg by mouth daily.    [provider]  cyclobenzaprine (FLEXERIL) 10 MG tablet Take 10 mg by mouth 3 (three) times daily as needed for muscle spasms.    [provider]  doxazosin (CARDURA) 8 MG tablet Take 8 mg by mouth at bedtime.     [provider]  glucose blood (FREESTYLE LITE) test strip Use as instructed Patient taking differently: 1 each by Other route daily. Use as instructed 07/31/14   Blanchie Serve, MD  glucose monitoring kit (FREESTYLE) monitoring kit 1 each by Does not apply route as needed for other. For blood sugar check Patient taking differently: 1 each by Does not apply route daily. For blood sugar check 07/31/14   Blanchie Serve, MD  ibuprofen (ADVIL,MOTRIN) 600 MG tablet Take 1 tablet (600 mg total) by mouth every 8 (eight) hours as needed. 11/11/17   Jola Schmidt, MD  Lancets 28G MISC To check blood sugar  three times daily with meals and at bedtime Patient taking differently: 1 strip by Other route daily.  07/31/14   Blanchie Serve, MD  levothyroxine (SYNTHROID, LEVOTHROID) 100 MCG tablet Take 100 mcg by mouth daily before breakfast.     [provider]  metFORMIN (GLUCOPHAGE) 500 MG tablet Take 500 mg by mouth daily with breakfast.     [provider]  tamsulosin (FLOMAX) 0.4 MG CAPS capsule Take 1 capsule by mouth twice daily 01/10/21   McKenzie, Candee Furbish, MD  traMADol (ULTRAM) 50 MG tablet Take 1 tablet (50 mg total) by mouth every 6 (six) hours as needed. 01/04/17   McKenzie, Candee Furbish, MD    Allergies    Rocephin [ceftriaxone]  Review of Systems   Review of Systems  Constitutional: Negative for fever.  Gastrointestinal: Negative for abdominal pain.  All other systems reviewed and are negative.   Physical Exam Updated Vital Signs BP 119/80 (BP Location: Right Arm)   Pulse 78   Temp 98.7 F (37.1 C) (Oral)   Resp 18   SpO2 97%   Physical Exam Vitals and nursing note reviewed.  Constitutional:      General: He is not in acute distress.    Appearance: Normal appearance. He is well-developed.  HENT:     Head: Normocephalic and atraumatic.  Eyes:     Conjunctiva/sclera: Conjunctivae normal.     Pupils: Pupils are equal, round, and reactive to light.  Cardiovascular:     Rate and Rhythm: Normal rate and regular rhythm.     Heart sounds: Normal heart sounds.  Pulmonary:     Effort: Pulmonary effort is normal. No respiratory distress.     Breath sounds: Normal breath sounds.  Abdominal:     General: There is no distension.     Palpations: Abdomen is soft.     Tenderness: There is no abdominal tenderness.  Genitourinary:    Comments: Left-sided tenderness of the scrotum noted.  Significant edema on the leftward aspect of the scrotum itself.  Minimal overlying erythema.  No drainage noted.  No crepitus noted. Musculoskeletal:        General: No deformity.  Normal range of motion.     Cervical back: Normal range of motion and neck supple.  Skin:    General: Skin is warm and dry.  Neurological:     General: No focal deficit present.     Mental Status: He is alert and oriented to person, place, and time.     ED Results / Procedures / Treatments   Labs (all labs ordered are listed, but only abnormal results are displayed) Labs Reviewed  URINALYSIS, ROUTINE W REFLEX MICROSCOPIC - Abnormal; Notable for the following components:      Result Value   Color, Urine AMBER (*)    APPearance CLOUDY (*)    Glucose, UA >=500 (*)    Hgb urine dipstick MODERATE (*)    Ketones, ur 20 (*)    Protein, ur >=300 (*)    Leukocytes,Ua LARGE (*)    WBC, UA >50 (*)    Bacteria, UA RARE (*)    All other components within normal limits  BASIC METABOLIC PANEL - Abnormal; Notable for the following components:   Potassium 6.2 (*)    CO2 21 (*)    Glucose, Bld 130 (*)    BUN 33 (*)    Creatinine, Ser 1.70 (*)    GFR, Estimated 41 (*)    All other components within normal limits  CBC WITH DIFFERENTIAL/PLATELET - Abnormal; Notable for the following components:   WBC 15.7 (*)    RBC 4.02 (*)    Hemoglobin 10.0 (*)    HCT 32.5 (*)    MCH 24.9 (*)    RDW 17.6 (*)    Neutro Abs 12.6 (*)    Abs Immature Granulocytes 0.22 (*)    All other components within normal limits  I-STAT CHEM 8, ED - Abnormal; Notable for the following components:   Potassium 5.7 (*)    BUN 32 (*)    Creatinine, Ser 1.40 (*)    Glucose, Bld 127 (*)    TCO2 21 (*)    Hemoglobin 11.2 (*)    HCT 33.0 (*)  All other components within normal limits  RESP PANEL BY RT-PCR (FLU A&B, COVID) ARPGX2  URINE CULTURE  PROTIME-INR  LACTIC ACID, PLASMA  LACTIC ACID, PLASMA  CBG MONITORING, ED    EKG None  Radiology US SCROTUM W/DOPPLER  Result Date: 03/21/2021 CLINICAL DATA:  Left testicular swelling EXAM: SCROTAL ULTRASOUND DOPPLER ULTRASOUND OF THE TESTICLES TECHNIQUE: Complete  ultrasound examination of the testicles, epididymis, and other scrotal structures was performed. Color and spectral Doppler ultrasound were also utilized to evaluate blood flow to the testicles. COMPARISON:  CT 11/11/2017 FINDINGS: Right testicle Measurements: 3.2 x 2.6 x 2.2 cm. No mass or microlithiasis visualized. Left testicle Measurements: 3.5 x 2.0 x 2.9 cm. Testicular parenchyma appears somewhat distorted due to compression what appears to be and heterogeneous, solid/cystic paratesticular lentiform lesion or collection measuring 3.9 x 1.3 x 3.3 cm in size. A separate more complex solid/cystic lesion is inferior to the left testicle as well measuring 2.4 x 3 x 3.1 cm in size lobular without internal color Doppler flow. Right epididymis:  Poorly visualized Left epididymis: Poorly visualized portions appear heterogeneous possibly thickened. Hydrocele: Moderate right hydrocele with low level internal echoes/debris. Areas has a more solid liver moderate Varicocele:  None visualized. Pulsed Doppler interrogation of both testes demonstrates normal low resistance arterial and venous waveforms bilaterally. IMPRESSION: Complex layering paratesticular collection versus cystic lesion around the left testicle some compression of the cystic liver parenchyma additional separate paratesticular heterogeneous mass without internal color vascularity is noted inferior to the left testicle as well. Recommend correlation clinical findings as hematoma or paratesticular abscess could have these appearances. Some questionable thickening and heterogeneity of the left epididymis could reflect epididymitis as well. Moderate right hydrocele with low-level internal echoes, can reflect chronicity versus superinfection as well. Electronically Signed   By: Lovena Le M.D.   On: 03/21/2021 20:05    Procedures Procedures   Medications Ordered in ED Medications - No data to display  ED Course  I have reviewed the triage vital signs  and the nursing notes.  Pertinent labs & imaging results that were available during my care of the patient were reviewed by me and considered in my medical decision making (see chart for details).    MDM Rules/Calculators/A&P                          MDM  MSE complete  SAIGE BUSBY was evaluated in Emergency Department on 03/21/2021 for the symptoms described in the history of present illness. He was evaluated in the context of the global COVID-19 pandemic, which necessitated consideration that the patient might be at risk for infection with the SARS-CoV-2 virus that causes COVID-19. Institutional protocols and algorithms that pertain to the evaluation of patients at risk for COVID-19 are in a state of rapid change based on information released by regulatory bodies including the CDC and federal and state organizations. These policies and algorithms were followed during the patient's care in the ED.   Patient is presenting with left-sided scrotal pain and swelling over the last 7 days.  Symptoms have worsened despite being on Cipro for the last 4 days.  Labs are notable for evidence of likely UTI with mildly bumped creatinine and mildly elevated potassium.  EKG is without evidence of significant effects from hyperkalemia.  Imaging of the scrotum is concerning for possible early scrotal abscess.  Case discussed with Dr. Jeffie Pollock who is covering urology overnight.  He agrees with plan for  IV antibiotics.  He requests hospitalist admission.  He will evaluate the patient in the morning.  Patient is to be remain n.p.o. after midnight for possible operative intervention.  Hospitalist service aware of case and will evaluate for admission. Final Clinical Impression(s) / ED Diagnoses Final diagnoses:  Scrotal pain  Dehydration    Rx / DC Orders ED Discharge Orders    None       Valarie Merino, MD 03/22/21 [phone]

## 2021-03-21 NOTE — Progress Notes (Signed)
Pharmacy Antibiotic Note  JAMARIEN RODKEY is a 75 y.o. male admitted on 03/21/2021 with L-sided scrotal abscess.  Pharmacy has been consulted for Zosyn dosing.  Est CrCl ~45 ml/min  Plan: Zosyn 3.375gm IV q8h Will f/u micro data, renal function, and pt's clinical condition    Temp (24hrs), Avg:98.7 F (37.1 C), Min:98.7 F (37.1 C), Max:98.7 F (37.1 C)  Recent Labs  Lab 03/21/21 2230 03/21/21 2251  WBC 15.7*  --   CREATININE 1.70* 1.40*  LATICACIDVEN 1.7  --     CrCl cannot be calculated (Unknown ideal weight.).    Allergies  Allergen Reactions  . Rocephin [Ceftriaxone] Nausea And Vomiting    Antimicrobials this admission: 5/23 Zosyn >>   Microbiology results: 5/23 UCx:    Thank you for allowing pharmacy to be a part of this patient's care.  Sherlon Handing, PharmD, BCPS Please see amion for complete clinical pharmacist phone list 03/21/2021 11:57 PM

## 2021-03-22 ENCOUNTER — Other Ambulatory Visit: Payer: Self-pay

## 2021-03-22 ENCOUNTER — Encounter (HOSPITAL_COMMUNITY): Payer: Self-pay | Admitting: Family Medicine

## 2021-03-22 DIAGNOSIS — I5042 Chronic combined systolic (congestive) and diastolic (congestive) heart failure: Secondary | ICD-10-CM | POA: Diagnosis present

## 2021-03-22 DIAGNOSIS — Z79899 Other long term (current) drug therapy: Secondary | ICD-10-CM | POA: Diagnosis not present

## 2021-03-22 DIAGNOSIS — E039 Hypothyroidism, unspecified: Secondary | ICD-10-CM

## 2021-03-22 DIAGNOSIS — N492 Inflammatory disorders of scrotum: Secondary | ICD-10-CM | POA: Diagnosis present

## 2021-03-22 DIAGNOSIS — Z87891 Personal history of nicotine dependence: Secondary | ICD-10-CM | POA: Diagnosis not present

## 2021-03-22 DIAGNOSIS — N453 Epididymo-orchitis: Secondary | ICD-10-CM | POA: Diagnosis present

## 2021-03-22 DIAGNOSIS — I428 Other cardiomyopathies: Secondary | ICD-10-CM | POA: Diagnosis not present

## 2021-03-22 DIAGNOSIS — Z8673 Personal history of transient ischemic attack (TIA), and cerebral infarction without residual deficits: Secondary | ICD-10-CM

## 2021-03-22 DIAGNOSIS — R338 Other retention of urine: Secondary | ICD-10-CM | POA: Diagnosis present

## 2021-03-22 DIAGNOSIS — Z20822 Contact with and (suspected) exposure to covid-19: Secondary | ICD-10-CM | POA: Diagnosis not present

## 2021-03-22 DIAGNOSIS — Z6829 Body mass index (BMI) 29.0-29.9, adult: Secondary | ICD-10-CM | POA: Diagnosis not present

## 2021-03-22 DIAGNOSIS — E875 Hyperkalemia: Secondary | ICD-10-CM | POA: Diagnosis present

## 2021-03-22 DIAGNOSIS — D62 Acute posthemorrhagic anemia: Secondary | ICD-10-CM | POA: Diagnosis not present

## 2021-03-22 DIAGNOSIS — A419 Sepsis, unspecified organism: Secondary | ICD-10-CM | POA: Diagnosis not present

## 2021-03-22 DIAGNOSIS — E119 Type 2 diabetes mellitus without complications: Secondary | ICD-10-CM | POA: Diagnosis not present

## 2021-03-22 DIAGNOSIS — R339 Retention of urine, unspecified: Secondary | ICD-10-CM

## 2021-03-22 DIAGNOSIS — E440 Moderate protein-calorie malnutrition: Secondary | ICD-10-CM | POA: Diagnosis not present

## 2021-03-22 DIAGNOSIS — N433 Hydrocele, unspecified: Secondary | ICD-10-CM | POA: Diagnosis present

## 2021-03-22 DIAGNOSIS — N179 Acute kidney failure, unspecified: Secondary | ICD-10-CM | POA: Diagnosis not present

## 2021-03-22 DIAGNOSIS — N289 Disorder of kidney and ureter, unspecified: Secondary | ICD-10-CM

## 2021-03-22 DIAGNOSIS — E860 Dehydration: Secondary | ICD-10-CM | POA: Diagnosis present

## 2021-03-22 DIAGNOSIS — I110 Hypertensive heart disease with heart failure: Secondary | ICD-10-CM | POA: Diagnosis not present

## 2021-03-22 DIAGNOSIS — I429 Cardiomyopathy, unspecified: Secondary | ICD-10-CM | POA: Diagnosis not present

## 2021-03-22 DIAGNOSIS — I77810 Thoracic aortic ectasia: Secondary | ICD-10-CM | POA: Diagnosis not present

## 2021-03-22 DIAGNOSIS — I959 Hypotension, unspecified: Secondary | ICD-10-CM | POA: Diagnosis not present

## 2021-03-22 DIAGNOSIS — I5043 Acute on chronic combined systolic (congestive) and diastolic (congestive) heart failure: Secondary | ICD-10-CM | POA: Diagnosis not present

## 2021-03-22 DIAGNOSIS — R6520 Severe sepsis without septic shock: Secondary | ICD-10-CM | POA: Diagnosis present

## 2021-03-22 DIAGNOSIS — E785 Hyperlipidemia, unspecified: Secondary | ICD-10-CM | POA: Diagnosis present

## 2021-03-22 DIAGNOSIS — N401 Enlarged prostate with lower urinary tract symptoms: Secondary | ICD-10-CM | POA: Diagnosis present

## 2021-03-22 DIAGNOSIS — Z881 Allergy status to other antibiotic agents status: Secondary | ICD-10-CM | POA: Diagnosis not present

## 2021-03-22 LAB — BASIC METABOLIC PANEL
Anion gap: 15 (ref 5–15)
BUN: 31 mg/dL — ABNORMAL HIGH (ref 8–23)
CO2: 19 mmol/L — ABNORMAL LOW (ref 22–32)
Calcium: 9.3 mg/dL (ref 8.9–10.3)
Chloride: 104 mmol/L (ref 98–111)
Creatinine, Ser: 1.48 mg/dL — ABNORMAL HIGH (ref 0.61–1.24)
GFR, Estimated: 48 mL/min — ABNORMAL LOW (ref >60)
Glucose, Bld: 121 mg/dL — ABNORMAL HIGH (ref 70–99)
Potassium: 4.4 mmol/L (ref 3.5–5.1)
Sodium: 138 mmol/L (ref 135–145)

## 2021-03-22 LAB — CBG MONITORING, ED
Glucose-Capillary: 121 mg/dL — ABNORMAL HIGH (ref 70–99)
Glucose-Capillary: 125 mg/dL — ABNORMAL HIGH (ref 70–99)
Glucose-Capillary: 131 mg/dL — ABNORMAL HIGH (ref 70–99)
Glucose-Capillary: 132 mg/dL — ABNORMAL HIGH (ref 70–99)
Glucose-Capillary: 137 mg/dL — ABNORMAL HIGH (ref 70–99)

## 2021-03-22 LAB — RAPID URINE DRUG SCREEN, HOSP PERFORMED
Amphetamines: NOT DETECTED
Barbiturates: NOT DETECTED
Benzodiazepines: NOT DETECTED
Cocaine: NOT DETECTED
Opiates: NOT DETECTED
Tetrahydrocannabinol: NOT DETECTED

## 2021-03-22 LAB — POTASSIUM
Potassium: 4.3 mmol/L (ref 3.5–5.1)
Potassium: 4.5 mmol/L (ref 3.5–5.1)
Potassium: 4.4 mmol/L (ref 3.5–5.1)

## 2021-03-22 LAB — HEMOGLOBIN A1C
Hgb A1c MFr Bld: 5.3 % (ref 4.8–5.6)
Mean Plasma Glucose: 105 mg/dL

## 2021-03-22 LAB — GLUCOSE, CAPILLARY
Glucose-Capillary: 135 mg/dL — ABNORMAL HIGH (ref 70–99)
Glucose-Capillary: 162 mg/dL — ABNORMAL HIGH (ref 70–99)

## 2021-03-22 LAB — CBC
HCT: 29.6 % — ABNORMAL LOW (ref 39.0–52.0)
Hemoglobin: 8.9 g/dL — ABNORMAL LOW (ref 13.0–17.0)
MCH: 24.7 pg — ABNORMAL LOW (ref 26.0–34.0)
MCHC: 30.1 g/dL (ref 30.0–36.0)
MCV: 82 fL (ref 80.0–100.0)
Platelets: 398 10*3/uL (ref 150–400)
RBC: 3.61 MIL/uL — ABNORMAL LOW (ref 4.22–5.81)
RDW: 17.4 % — ABNORMAL HIGH (ref 11.5–15.5)
WBC: 9.9 10*3/uL (ref 4.0–10.5)
nRBC: 0 % (ref 0.0–0.2)

## 2021-03-22 MED ORDER — PIPERACILLIN-TAZOBACTAM 3.375 G IVPB
3.3750 g | Freq: Three times a day (TID) | INTRAVENOUS | Status: AC
  Administered 2021-03-22 – 2021-03-28 (×19): 3.375 g via INTRAVENOUS
  Filled 2021-03-22 (×19): qty 50

## 2021-03-22 MED ORDER — FINASTERIDE 5 MG PO TABS
5.0000 mg | ORAL_TABLET | Freq: Every day | ORAL | Status: DC
  Administered 2021-03-22 – 2021-03-30 (×8): 5 mg via ORAL
  Filled 2021-03-22 (×8): qty 1

## 2021-03-22 MED ORDER — PIPERACILLIN-TAZOBACTAM 3.375 G IVPB 30 MIN
3.3750 g | INTRAVENOUS | Status: AC
  Administered 2021-03-22: 3.375 g via INTRAVENOUS
  Filled 2021-03-22: qty 50

## 2021-03-22 MED ORDER — SODIUM CHLORIDE 0.9% FLUSH: 10.0000 mL | INTRAVENOUS | Status: DC | PRN

## 2021-03-22 MED ORDER — DOXAZOSIN MESYLATE 8 MG PO TABS
8.0000 mg | ORAL_TABLET | Freq: Every day | ORAL | Status: DC
  Filled 2021-03-22: qty 1

## 2021-03-22 MED ORDER — DAPAGLIFLOZIN PROPANEDIOL 5 MG PO TABS
5.0000 mg | ORAL_TABLET | Freq: Every day | ORAL | Status: DC
  Administered 2021-03-22 – 2021-03-24 (×3): 5 mg via ORAL
  Filled 2021-03-22 (×4): qty 1

## 2021-03-22 MED ORDER — ACETAMINOPHEN 325 MG PO TABS
650.0000 mg | ORAL_TABLET | Freq: Four times a day (QID) | ORAL | Status: DC | PRN
  Administered 2021-03-22 – 2021-03-28 (×4): 650 mg via ORAL
  Filled 2021-03-22 (×4): qty 2

## 2021-03-22 MED ORDER — SODIUM CHLORIDE 0.9 % IV SOLN: INTRAVENOUS | Status: AC

## 2021-03-22 MED ORDER — TAMSULOSIN HCL 0.4 MG PO CAPS
0.4000 mg | ORAL_CAPSULE | Freq: Two times a day (BID) | ORAL | Status: DC
  Administered 2021-03-22 – 2021-03-24 (×6): 0.4 mg via ORAL
  Filled 2021-03-22 (×6): qty 1

## 2021-03-22 MED ORDER — ACETAMINOPHEN 650 MG RE SUPP: 650.0000 mg | Freq: Four times a day (QID) | RECTAL | Status: DC | PRN

## 2021-03-22 MED ORDER — SODIUM ZIRCONIUM CYCLOSILICATE 10 G PO PACK
10.0000 g | PACK | Freq: Once | ORAL | Status: AC
  Administered 2021-03-22: 10 g via ORAL
  Filled 2021-03-22: qty 1

## 2021-03-22 MED ORDER — SENNOSIDES-DOCUSATE SODIUM 8.6-50 MG PO TABS: 1.0000 | ORAL_TABLET | Freq: Every evening | ORAL | Status: DC | PRN

## 2021-03-22 MED ORDER — ONDANSETRON HCL 4 MG PO TABS: 4.0000 mg | ORAL_TABLET | Freq: Four times a day (QID) | ORAL | Status: DC | PRN

## 2021-03-22 MED ORDER — CHLORHEXIDINE GLUCONATE CLOTH 2 % EX PADS
6.0000 | MEDICATED_PAD | Freq: Every day | CUTANEOUS | Status: DC
  Administered 2021-03-23 – 2021-03-27 (×4): 6 via TOPICAL

## 2021-03-22 MED ORDER — CARVEDILOL 3.125 MG PO TABS
3.1250 mg | ORAL_TABLET | Freq: Two times a day (BID) | ORAL | Status: DC
  Administered 2021-03-22 – 2021-03-30 (×16): 3.125 mg via ORAL
  Filled 2021-03-22 (×16): qty 1

## 2021-03-22 MED ORDER — FENTANYL CITRATE (PF) 100 MCG/2ML IJ SOLN
25.0000 ug | INTRAMUSCULAR | Status: DC | PRN
  Administered 2021-03-22 – 2021-03-25 (×2): 50 ug via INTRAVENOUS
  Filled 2021-03-22 (×2): qty 2

## 2021-03-22 MED ORDER — SODIUM CHLORIDE 0.9% FLUSH
10.0000 mL | Freq: Two times a day (BID) | INTRAVENOUS | Status: DC
  Administered 2021-03-22 – 2021-03-27 (×8): 10 mL

## 2021-03-22 MED ORDER — ONDANSETRON HCL 4 MG/2ML IJ SOLN: 4.0000 mg | Freq: Four times a day (QID) | INTRAMUSCULAR | Status: DC | PRN

## 2021-03-22 MED ORDER — INSULIN ASPART 100 UNIT/ML IJ SOLN
0.0000 [IU] | INTRAMUSCULAR | Status: DC
  Administered 2021-03-23: 1 [IU] via SUBCUTANEOUS

## 2021-03-22 MED ORDER — CYCLOBENZAPRINE HCL 10 MG PO TABS
10.0000 mg | ORAL_TABLET | Freq: Three times a day (TID) | ORAL | Status: DC | PRN
  Administered 2021-03-22: 10 mg via ORAL
  Filled 2021-03-22 (×2): qty 1

## 2021-03-22 MED ORDER — LEVOTHYROXINE SODIUM 100 MCG PO TABS
100.0000 ug | ORAL_TABLET | Freq: Every day | ORAL | Status: DC
  Administered 2021-03-22 – 2021-03-30 (×8): 100 ug via ORAL
  Filled 2021-03-22 (×8): qty 1

## 2021-03-22 MED ORDER — ATORVASTATIN CALCIUM 20 MG PO TABS
20.0000 mg | ORAL_TABLET | Freq: Every day | ORAL | Status: DC
  Administered 2021-03-22 – 2021-03-30 (×8): 20 mg via ORAL
  Filled 2021-03-22 (×5): qty 1
  Filled 2021-03-22 (×3): qty 2

## 2021-03-22 NOTE — Progress Notes (Signed)
2 IV RN attempted IV access with ultrasound. RN made aware to notify MD for better access.

## 2021-03-22 NOTE — Consult Note (Signed)
Subjective:    Consult requested by Dr. Dene Gentry.  Brad Harrell is a 75 yo male with a history of BPH with BOO who has previously been treated with a Urolift and is currently on tamsulosin and finasteride.   He had the onset about a week ago of left testicular pain and swelling and was started on Cipro by his PCP.  He has had progressive pain and swelling with some radiation into the left groin and came to the ER.   He was tachycardic but has no fever or chills.  A scrotal US shows left epididymitis with a possible para-testicular abscess vs pyocele.  He has had persistent voiding complaints with frequency, urgency and nocturia with a reduced stream and his PVR today is 366m.  His Cr is 1.7 and his WBC on admission was 15.7.  He is on Plavix but that has been held on admission.  ROS:  Review of Systems  All other systems reviewed and are negative.   Allergies  Allergen Reactions  . Rocephin [Ceftriaxone] Nausea And Vomiting    Past Medical History:  Diagnosis Date  . Anemia    Iron Defficiency  . Cardiomyopathy ([place]   . Combined systolic and diastolic congestive heart failure ([place]   . Diabetes mellitus without complication ([facility]    TYPE 2  . Foley catheter in place    SINCE NOV 2017  . H/O cocaine abuse ([facility]   . Hyperlipidemia   . Hypertension   . Hypothyroidism   . Liver abscess    measuring 7 x 9 cm/notes 03/04/2013  . Stroke ([place]   WEAK ON BOTH SIDES NOW TOTAL OF 3 STROKES   . Urinary retention     Past Surgical History:  Procedure Laterality Date  . APPENDECTOMY    . COLONOSCOPY N/A 03/11/2013   Procedure: COLONOSCOPY;  Surgeon: PBeryle Beams MD;  [place]  Service: Endoscopy;  Laterality: N/A;  . COLONOSCOPY WITH PROPOFOL N/A 03/15/2018   Procedure: COLONOSCOPY WITH PROPOFOL;  Surgeon: HCarol Ada MD;  Location[facility];  Service: Endoscopy;  Laterality: N/A;  . CYSTOSCOPY WITH INSERTION OF UROLIFT N/A 01/04/2017   Procedure:  CYSTOSCOPY WITH INSERTION OF UROLIFT x8;  Surgeon: PCleon Gustin MD;  Location: [facility];  Service: Urology;  Laterality: N/A;  . ESOPHAGOGASTRODUODENOSCOPY N/A 03/11/2013   Procedure: ESOPHAGOGASTRODUODENOSCOPY (EGD);  Surgeon: PBeryle Beams MD;  [facility];  Service: Endoscopy;  Laterality: N/A;  . GIVENS CAPSULE STUDY N/A 03/12/2013   Procedure: GIVENS CAPSULE STUDY;  Surgeon: PBeryle Beams MD;  [place]  Service: [place];  Laterality: N/A;  . INGUINAL HERNIA REPAIR Right   . POLYPECTOMY  03/15/2018   Procedure: POLYPECTOMY;  Surgeon: HCarol Ada MD;  [facility];  Service: Endoscopy;;  . TIBIA FRACTURE SURGERY Left     Social History   Socioeconomic History  . Marital status: Widowed    Spouse name: Not on file  . Number of children: Not on file  . Years of education: Not on file  . Highest education level: Not on file  Occupational History  . Not on file  Tobacco Use  . Smoking status: Former Smoker    Packs/day: 1.00    Years: 51.00    Pack years: 51.00    Types: Cigarettes  . Smokeless tobacco: Never Used  . Tobacco comment: QUIT NOV 2010  Vaping Use  . Vaping Use: Never used  Substance and Sexual Activity  . Alcohol use:  No    Comment: NOV 2010  . Drug use: Yes    Types: Cocaine    Comment: NOV 2010 LAST USED  . Sexual activity: Not Currently  Other Topics Concern  . Not on file  Social History Narrative  . Not on file   Social Determinants of Health   Financial Resource Strain: Not on file  Food Insecurity: Not on file  Transportation Needs: Not on file  Physical Activity: Not on file  Stress: Not on file  Social Connections: Not on file  Intimate Partner Violence: Not on file    Family History  Problem Relation Age of Onset  . Hypertension Mother   . Hypertension Father   . Diabetes type II Father   . Hypertension Brother     Anti-infectives: Anti-infectives (From admission, onward)   Start      Dose/Rate Route Frequency Ordered Stop   03/22/21 0800  piperacillin-tazobactam (ZOSYN) IVPB 3.375 g        3.375 g 12.5 mL/hr over 240 Minutes Intravenous Every 8 hours 03/22/21 0000     03/22/21 0015  piperacillin-tazobactam (ZOSYN) IVPB 3.375 g        3.375 g 100 mL/hr over 30 Minutes Intravenous STAT 03/22/21 0000 03/22/21 0227      Current Facility-Administered Medications  Medication Dose Route Frequency Provider Last Rate Last Admin  . 0.9 %  sodium chloride infusion   Intravenous Continuous Opyd, Ilene Qua, MD 100 mL/hr at 03/22/21 0509 New Bag at 03/22/21 0509  . acetaminophen (TYLENOL) tablet 650 mg  650 mg Oral Q6H PRN Opyd, Ilene Qua, MD       Or  . acetaminophen (TYLENOL) suppository 650 mg  650 mg Rectal Q6H PRN Opyd, Ilene Qua, MD      . atorvastatin (LIPITOR) tablet 20 mg  20 mg Oral Daily Opyd, Ilene Qua, MD      . carvedilol (COREG) tablet 3.125 mg  3.125 mg Oral BID WC Opyd, Ilene Qua, MD      . Chlorhexidine Gluconate Cloth 2 % PADS 6 each  6 each Topical Daily Opyd, Ilene Qua, MD      . cyclobenzaprine (FLEXERIL) tablet 10 mg  10 mg Oral TID PRN Opyd, Ilene Qua, MD      . dapagliflozin propanediol (FARXIGA) tablet 5 mg  5 mg Oral Daily Opyd, Ilene Qua, MD      . fentaNYL (SUBLIMAZE) injection 25-50 mcg  25-50 mcg Intravenous Q2H PRN Opyd, Ilene Qua, MD      . finasteride (PROSCAR) tablet 5 mg  5 mg Oral Daily Opyd, Ilene Qua, MD      . insulin aspart (novoLOG) injection 0-6 Units  0-6 Units Subcutaneous Q4H Opyd, Ilene Qua, MD      . levothyroxine (SYNTHROID) tablet 100 mcg  100 mcg Oral QAC breakfast Opyd, Ilene Qua, MD      . ondansetron (ZOFRAN) tablet 4 mg  4 mg Oral Q6H PRN Opyd, Ilene Qua, MD       Or  . ondansetron (ZOFRAN) injection 4 mg  4 mg Intravenous Q6H PRN Opyd, Ilene Qua, MD      . piperacillin-tazobactam (ZOSYN) IVPB 3.375 g  3.375 g Intravenous Q8H Opyd, Ilene Qua, MD      . senna-docusate (Senokot-S) tablet 1 tablet  1 tablet Oral QHS PRN Opyd,  Ilene Qua, MD      . sodium chloride flush (NS) 0.9 % injection 10-40 mL  10-40 mL Intracatheter Q12H Opyd, Ilene Qua, MD      .  sodium chloride flush (NS) 0.9 % injection 10-40 mL  10-40 mL Intracatheter PRN Opyd, Ilene Qua, MD      . tamsulosin (FLOMAX) capsule 0.4 mg  0.4 mg Oral BID Opyd, Ilene Qua, MD       Current Outpatient Medications  Medication Sig Dispense Refill  . acetaminophen (TYLENOL) 325 MG tablet Take 2 tablets (650 mg total) by mouth every 6 (six) hours as needed for mild pain (or Fever >/= 101).    Marland Kitchen aspirin EC 81 MG tablet Take 81 mg by mouth daily.    Marland Kitchen atorvastatin (LIPITOR) 20 MG tablet TAKE ONE TABLET BY MOUTH DAILY (Patient taking differently: Take 20 mg by mouth daily.) 30 tablet 0  . carvedilol (COREG) 3.125 MG tablet Take 3.125 mg by mouth 2 (two) times daily.    . ciprofloxacin (CIPRO) 500 MG tablet Take 500 mg by mouth 2 (two) times daily.    . clopidogrel (PLAVIX) 75 MG tablet Take 75 mg by mouth daily.    . cyclobenzaprine (FLEXERIL) 10 MG tablet Take 10 mg by mouth 3 (three) times daily as needed for muscle spasms.    Marland Kitchen doxazosin (CARDURA) 8 MG tablet Take 8 mg by mouth at bedtime.     Marland Kitchen FARXIGA 5 MG TABS tablet Take 5 mg by mouth daily.    . finasteride (PROSCAR) 5 MG tablet Take 5 mg by mouth daily.    Marland Kitchen glucose blood (FREESTYLE LITE) test strip Use as instructed (Patient taking differently: 1 each by Other route daily. Use as instructed) 100 each 12  . glucose monitoring kit (FREESTYLE) monitoring kit 1 each by Does not apply route as needed for other. For blood sugar check (Patient taking differently: 1 each by Does not apply route daily. For blood sugar check) 1 each 0  . Lancets 28G MISC To check blood sugar three times daily with meals and at bedtime (Patient taking differently: 1 strip by Other route daily.) 120 each 3  . levothyroxine (SYNTHROID, LEVOTHROID) 100 MCG tablet Take 100 mcg by mouth daily before breakfast.     . metFORMIN (GLUCOPHAGE) 500 MG  tablet Take 500 mg by mouth daily with breakfast.     . oxyCODONE-acetaminophen (PERCOCET/ROXICET) 5-325 MG tablet Take 1 tablet by mouth 2 (two) times daily as needed for pain.    . tamsulosin (FLOMAX) 0.4 MG CAPS capsule Take 1 capsule by mouth twice daily (Patient taking differently: Take 0.4 mg by mouth 2 (two) times daily.) 180 capsule 0     Objective: Vital signs in last 24 hours: BP (!) 155/79   Pulse (!) 105   Temp 98.7 F (37.1 C) (Oral)   Resp 16   SpO2 100%   Intake/Output from previous day: 05/23 0701 - 05/24 0700 In: 1000 [IV Piggyback:1000] Out: -  Intake/Output this shift: No intake/output data recorded.   Physical Exam Vitals reviewed.  Constitutional:      Appearance: Normal appearance.  Abdominal:     General: Abdomen is flat.     Palpations: There is mass (suprapubic consistent with a full bladder. ).     Tenderness: There is abdominal tenderness (mild suprapubic).     Hernia: No hernia is present.  Genitourinary:    Comments: Normal phallus with adequate meatus. Scrotum normal on the right with a normal testicle and epididymis.   Left scrotum is moderately swollen and indurated without fluctuance or overlying cellulitis.  There is no inflammatory change along the cord.   Musculoskeletal:  General: No swelling or tenderness. Normal range of motion.  Skin:    General: Skin is warm and dry.  Neurological:     General: No focal deficit present.     Mental Status: He is alert and oriented to person, place, and time.  Psychiatric:        Mood and Affect: Mood normal.        Behavior: Behavior normal.     Lab Results:  Results for orders placed or performed during the hospital encounter of 03/21/21 (from the past 24 hour(s))  Resp Panel by RT-PCR (Flu A&B, Covid) Nasopharyngeal Swab     Status: None   Collection Time: 03/21/21  8:40 PM   Specimen: Nasopharyngeal Swab; Nasopharyngeal(NP) swabs in vial transport medium  Result Value Ref Range    SARS Coronavirus 2 by RT PCR NEGATIVE NEGATIVE   Influenza A by PCR NEGATIVE NEGATIVE   Influenza B by PCR NEGATIVE NEGATIVE  Basic metabolic panel     Status: Abnormal   Collection Time: 03/21/21 10:30 PM  Result Value Ref Range   Sodium 135 135 - 145 mmol/L   Potassium 6.2 (H) 3.5 - 5.1 mmol/L   Chloride 99 98 - 111 mmol/L   CO2 21 (L) 22 - 32 mmol/L   Glucose, Bld 130 (H) 70 - 99 mg/dL   BUN 33 (H) 8 - 23 mg/dL   Creatinine, Ser 1.70 (H) 0.61 - 1.24 mg/dL   Calcium 9.8 8.9 - 10.3 mg/dL   GFR, Estimated 41 (L) >60 mL/min   Anion gap 15 5 - 15  CBC with Differential     Status: Abnormal   Collection Time: 03/21/21 10:30 PM  Result Value Ref Range   WBC 15.7 (H) 4.0 - 10.5 K/uL   RBC 4.02 (L) 4.22 - 5.81 MIL/uL   Hemoglobin 10.0 (L) 13.0 - 17.0 g/dL   HCT 32.5 (L) 39.0 - 52.0 %   MCV 80.8 80.0 - 100.0 fL   MCH 24.9 (L) 26.0 - 34.0 pg   MCHC 30.8 30.0 - 36.0 g/dL   RDW 17.6 (H) 11.5 - 15.5 %   Platelets 344 150 - 400 K/uL   nRBC 0.0 0.0 - 0.2 %   Neutrophils Relative % 81 %   Neutro Abs 12.6 (H) 1.7 - 7.7 K/uL   Lymphocytes Relative 11 %   Lymphs Abs 1.8 0.7 - 4.0 K/uL   Monocytes Relative 7 %   Monocytes Absolute 1.0 0.1 - 1.0 K/uL   Eosinophils Relative 0 %   Eosinophils Absolute 0.1 0.0 - 0.5 K/uL   Basophils Relative 0 %   Basophils Absolute 0.0 0.0 - 0.1 K/uL   Immature Granulocytes 1 %   Abs Immature Granulocytes 0.22 (H) 0.00 - 0.07 K/uL  Protime-INR     Status: None   Collection Time: 03/21/21 10:30 PM  Result Value Ref Range   Prothrombin Time 14.3 11.4 - 15.2 seconds   INR 1.1 0.8 - 1.2  Lactic acid, plasma     Status: None   Collection Time: 03/21/21 10:30 PM  Result Value Ref Range   Lactic Acid, Venous 1.7 0.5 - 1.9 mmol/L  Urinalysis, Routine w reflex microscopic Urine, Clean Catch     Status: Abnormal   Collection Time: 03/21/21 10:43 PM  Result Value Ref Range   Color, Urine AMBER (A) YELLOW   APPearance CLOUDY (A) CLEAR   Specific Gravity, Urine  1.022 1.005 - 1.030   pH 5.0 5.0 - 8.0  Glucose, UA >=500 (A) NEGATIVE mg/dL   Hgb urine dipstick MODERATE (A) NEGATIVE   Bilirubin Urine NEGATIVE NEGATIVE   Ketones, ur 20 (A) NEGATIVE mg/dL   Protein, ur >=300 (A) NEGATIVE mg/dL   Nitrite NEGATIVE NEGATIVE   Leukocytes,Ua LARGE (A) NEGATIVE   RBC / HPF 21-50 0 - 5 RBC/hpf   WBC, UA >50 (H) 0 - 5 WBC/hpf   Bacteria, UA RARE (A) NONE SEEN   Mucus PRESENT    Hyaline Casts, UA PRESENT   I-stat chem 8, ED (not at [facility] or [facility])     Status: Abnormal   Collection Time: 03/21/21 10:51 PM  Result Value Ref Range   Sodium 136 135 - 145 mmol/L   Potassium 5.7 (H) 3.5 - 5.1 mmol/L   Chloride 103 98 - 111 mmol/L   BUN 32 (H) 8 - 23 mg/dL   Creatinine, Ser 1.40 (H) 0.61 - 1.24 mg/dL   Glucose, Bld 127 (H) 70 - 99 mg/dL   Calcium, Ion 1.25 1.15 - 1.40 mmol/L   TCO2 21 (L) 22 - 32 mmol/L   Hemoglobin 11.2 (L) 13.0 - 17.0 g/dL   HCT 33.0 (L) 39.0 - 52.0 %  CBG monitoring, ED     Status: Abnormal   Collection Time: 03/22/21 12:05 AM  Result Value Ref Range   Glucose-Capillary 121 (H) 70 - 99 mg/dL   Comment 1 Notify RN    Comment 2 Document in Chart   CBG monitoring, ED     Status: Abnormal   Collection Time: 03/22/21  2:57 AM  Result Value Ref Range   Glucose-Capillary 131 (H) 70 - 99 mg/dL  Basic metabolic panel     Status: Abnormal   Collection Time: 03/22/21  4:35 AM  Result Value Ref Range   Sodium 138 135 - 145 mmol/L   Potassium 4.4 3.5 - 5.1 mmol/L   Chloride 104 98 - 111 mmol/L   CO2 19 (L) 22 - 32 mmol/L   Glucose, Bld 121 (H) 70 - 99 mg/dL   BUN 31 (H) 8 - 23 mg/dL   Creatinine, Ser 1.48 (H) 0.61 - 1.24 mg/dL   Calcium 9.3 8.9 - 10.3 mg/dL   GFR, Estimated 48 (L) >60 mL/min   Anion gap 15 5 - 15  CBC     Status: Abnormal   Collection Time: 03/22/21  4:35 AM  Result Value Ref Range   WBC 9.9 4.0 - 10.5 K/uL   RBC 3.61 (L) 4.22 - 5.81 MIL/uL   Hemoglobin 8.9 (L) 13.0 - 17.0 g/dL   HCT 29.6 (L) 39.0 - 52.0 %   MCV 82.0  80.0 - 100.0 fL   MCH 24.7 (L) 26.0 - 34.0 pg   MCHC 30.1 30.0 - 36.0 g/dL   RDW 17.4 (H) 11.5 - 15.5 %   Platelets 398 150 - 400 K/uL   nRBC 0.0 0.0 - 0.2 %  CBG monitoring, ED     Status: Abnormal   Collection Time: 03/22/21  5:04 AM  Result Value Ref Range   Glucose-Capillary 125 (H) 70 - 99 mg/dL    BMET Recent Labs    03/21/21 2230 03/21/21 2251 03/22/21 0435  NA 135 136 138  K 6.2* 5.7* 4.4  CL 99 103 104  CO2 21*  --  19*  GLUCOSE 130* 127* 121*  BUN 33* 32* 31*  CREATININE 1.70* 1.40* 1.48*  CALCIUM 9.8  --  9.3   PT/INR Recent Labs    03/21/21 2230  LABPROT 14.3  INR 1.1   ABG No results for input(s): PHART, HCO3 in the last 72 hours.  Invalid input(s): PCO2, PO2  Studies/Results: US SCROTUM W/DOPPLER  Result Date: 03/21/2021 CLINICAL DATA:  Left testicular swelling EXAM: SCROTAL ULTRASOUND DOPPLER ULTRASOUND OF THE TESTICLES TECHNIQUE: Complete ultrasound examination of the testicles, epididymis, and other scrotal structures was performed. Color and spectral Doppler ultrasound were also utilized to evaluate blood flow to the testicles. COMPARISON:  CT 11/11/2017 FINDINGS: Right testicle Measurements: 3.2 x 2.6 x 2.2 cm. No mass or microlithiasis visualized. Left testicle Measurements: 3.5 x 2.0 x 2.9 cm. Testicular parenchyma appears somewhat distorted due to compression what appears to be and heterogeneous, solid/cystic paratesticular lentiform lesion or collection measuring 3.9 x 1.3 x 3.3 cm in size. A separate more complex solid/cystic lesion is inferior to the left testicle as well measuring 2.4 x 3 x 3.1 cm in size lobular without internal color Doppler flow. Right epididymis:  Poorly visualized Left epididymis: Poorly visualized portions appear heterogeneous possibly thickened. Hydrocele: Moderate right hydrocele with low level internal echoes/debris. Areas has a more solid liver moderate Varicocele:  None visualized. Pulsed Doppler interrogation of both  testes demonstrates normal low resistance arterial and venous waveforms bilaterally. IMPRESSION: Complex layering paratesticular collection versus cystic lesion around the left testicle some compression of the cystic liver parenchyma additional separate paratesticular heterogeneous mass without internal color vascularity is noted inferior to the left testicle as well. Recommend correlation clinical findings as hematoma or paratesticular abscess could have these appearances. Some questionable thickening and heterogeneity of the left epididymis could reflect epididymitis as well. Moderate right hydrocele with low-level internal echoes, can reflect chronicity versus superinfection as well. Electronically Signed   By: Lovena Le M.D.   On: 03/21/2021 20:05   I have reviewed our prior office notes, notes, labs and imaging in Epic and discussed the case with Dr. Francia Greaves.   Assessment/Plan: 1. Left epididymo-orchitis with possible abscess vs pyocele.  Clinically improving on IV antibiotics.   Plavix just held.  Will continue to monitor for now and reassess for need for I&D when plavix has had a chance to clear the system.  If he improves further on IV antibiotics, he may be able to be treated with a prolonged course of oral antibiotics without the need for surgery.   2. BPH with BOO and incomplete emptying despite prior Urolift and current finasteride and tamsulosin.   Foley catheter ordered.  Will need to consider outlet procedure in the future.          No follow-ups on file.    CC: Dr. Dene Gentry and Dr. Sofie Hartigan.      [name] 03/22/2021 [phone]

## 2021-03-22 NOTE — ED Notes (Signed)
Pt's daughter left her phone number for updates:  [phone]

## 2021-03-22 NOTE — H&P (Signed)
History and Physical    Brad Harrell XBM:[id number] DOB: August 02, 1946 DOA: 03/21/2021  PCP: Brad Ebbs, MD   Patient coming from: Home   Chief Complaint: Left scrotal pain and swelling   HPI: Brad Harrell is a 75 y.o. male with medical history significant for type 2 diabetes mellitus, hypertension, hypothyroidism, BPH, chronic combined systolic and diastolic CHF, now presenting to the emergency department for evaluation of worsening left scrotal pain and swelling despite starting ciprofloxacin on 03/18/2021.  Symptoms began 5 to 7 days ago with tenderness involving the left testicle.  Patient was evaluated on 03/18/2021 for this, started on ciprofloxacin, but has experienced progressive pain and swelling despite this.  He has not noticed any chills or subjective fever.  He reports some mild lower abdominal discomfort but denies any flank pain, nausea, or vomiting.  He has never experienced this previously.  ED Course: Upon arrival to the ED, patient is found to be afebrile, saturating well on room air, and with stable blood pressure.  EKG features sinus tachycardia with rate 103.  Scrotal ultrasound reveals a complex layering peritesticular collection versus cystic lesion around the left testicle; there is also question of epididymitis on ultrasound.  Chemistry panel notable for potassium 6.2, BUN 33, and creatinine 1.70.  CBC features a leukocytosis to 15,700 and hemoglobin 10.0.  Lactic acid is normal.  Urology was consulted by the ED physician at the patient was started on a liter of saline and Zosyn.  Review of Systems:  All other systems reviewed and apart from HPI, are negative.  Past Medical History:  Diagnosis Date  . Anemia    Iron Defficiency  . Cardiomyopathy ([place])   . Combined systolic and diastolic congestive heart failure ([place])   . Diabetes mellitus without complication ([place])    TYPE 2  . Foley catheter in place    SINCE NOV 2017  . H/O cocaine abuse ([place])   .  Hyperlipidemia   . Hypertension   . Hypothyroidism   . Liver abscess    measuring 7 x 9 cm/notes 03/04/2013  . Stroke ([place]) 2010   WEAK ON BOTH SIDES NOW TOTAL OF 3 STROKES   . Urinary retention     Past Surgical History:  Procedure Laterality Date  . APPENDECTOMY    . COLONOSCOPY N/A 03/11/2013   Procedure: COLONOSCOPY;  Surgeon: Beryle Beams, MD;  Location: [facility];  Service: Endoscopy;  Laterality: N/A;  . COLONOSCOPY WITH PROPOFOL N/A 03/15/2018   Procedure: COLONOSCOPY WITH PROPOFOL;  Surgeon: Carol Ada, MD;  Location: [facility];  Service: Endoscopy;  Laterality: N/A;  . CYSTOSCOPY WITH INSERTION OF UROLIFT N/A 01/04/2017   Procedure: CYSTOSCOPY WITH INSERTION OF UROLIFT x8;  Surgeon: Cleon Gustin, MD;  Location: [facility];  Service: Urology;  Laterality: N/A;  . ESOPHAGOGASTRODUODENOSCOPY N/A 03/11/2013   Procedure: ESOPHAGOGASTRODUODENOSCOPY (EGD);  Surgeon: Beryle Beams, MD;  Location: [facility];  Service: Endoscopy;  Laterality: N/A;  . GIVENS CAPSULE STUDY N/A 03/12/2013   Procedure: GIVENS CAPSULE STUDY;  Surgeon: Beryle Beams, MD;  Location: [place] [facility];  Service: Endoscopy;  Laterality: N/A;  . INGUINAL HERNIA REPAIR Right   . POLYPECTOMY  03/15/2018   Procedure: POLYPECTOMY;  Surgeon: Carol Ada, MD;  Location: [facility];  Service: Endoscopy;;  . TIBIA FRACTURE SURGERY Left     Social History:   reports that he has quit smoking. His smoking use included cigarettes. He has a 51.00 pack-year smoking history. He has never used smokeless  tobacco. He reports current drug use. Drug: Cocaine. He reports that he does not drink alcohol.  Allergies  Allergen Reactions  . Rocephin [Ceftriaxone] Nausea And Vomiting    Family History  Problem Relation Age of Onset  . Hypertension Mother   . Hypertension Father   . Diabetes type II Father   . Hypertension Brother      Prior to Admission medications   Medication Sig Start Date End Date Taking?  Authorizing Provider  acetaminophen (TYLENOL) 325 MG tablet Take 2 tablets (650 mg total) by mouth every 6 (six) hours as needed for mild pain (or Fever >/= 101). 08/21/16  Yes Geradine Girt, DO  aspirin EC 81 MG tablet Take 81 mg by mouth daily.   Yes [provider]  atorvastatin (LIPITOR) 20 MG tablet TAKE ONE TABLET BY MOUTH DAILY Patient taking differently: Take 20 mg by mouth daily. 09/07/14  Yes Reed, Tiffany L, DO  carvedilol (COREG) 3.125 MG tablet Take 3.125 mg by mouth 2 (two) times daily. 11/11/16  Yes [provider]  ciprofloxacin (CIPRO) 500 MG tablet Take 500 mg by mouth 2 (two) times daily. 03/18/21  Yes [provider]  clopidogrel (PLAVIX) 75 MG tablet Take 75 mg by mouth daily.   Yes [provider]  cyclobenzaprine (FLEXERIL) 10 MG tablet Take 10 mg by mouth 3 (three) times daily as needed for muscle spasms.   Yes [provider]  doxazosin (CARDURA) 8 MG tablet Take 8 mg by mouth at bedtime.    Yes [provider]  FARXIGA 5 MG TABS tablet Take 5 mg by mouth daily. 02/02/21  Yes [provider]  finasteride (PROSCAR) 5 MG tablet Take 5 mg by mouth daily. 03/16/21  Yes [provider]  glucose blood (FREESTYLE LITE) test strip Use as instructed Patient taking differently: 1 each by Other route daily. Use as instructed 07/31/14  Yes Pandey, Mahima, MD  glucose monitoring kit (FREESTYLE) monitoring kit 1 each by Does not apply route as needed for other. For blood sugar check Patient taking differently: 1 each by Does not apply route daily. For blood sugar check 07/31/14  Yes Blanchie Serve, MD  Lancets 28G MISC To check blood sugar three times daily with meals and at bedtime Patient taking differently: 1 strip by Other route daily. 07/31/14  Yes Blanchie Serve, MD  levothyroxine (SYNTHROID, LEVOTHROID) 100 MCG tablet Take 100 mcg by mouth daily before breakfast.    Yes [provider]  metFORMIN  (GLUCOPHAGE) 500 MG tablet Take 500 mg by mouth daily with breakfast.    Yes [provider]  oxyCODONE-acetaminophen (PERCOCET/ROXICET) 5-325 MG tablet Take 1 tablet by mouth 2 (two) times daily as needed for pain. 03/18/21  Yes [provider]  tamsulosin (FLOMAX) 0.4 MG CAPS capsule Take 1 capsule by mouth twice daily Patient taking differently: Take 0.4 mg by mouth 2 (two) times daily. 01/10/21  Yes McKenzieCandee Furbish, MD    Physical Exam: Vitals:   03/21/21 1348 03/21/21 1749 03/21/21 2040 03/21/21 2355  BP: (!) 157/85 115/77 119/80 (!) 149/76  Pulse: 76 (!) 110 78 (!) 104  Resp: 16 18 18  (!) 22  Temp: 98.7 F (37.1 C)     TempSrc: Oral     SpO2: 99% 100% 97% 97%    Constitutional: NAD, calm  Eyes: PERTLA, lids and conjunctivae normal ENMT: Mucous membranes are moist. Posterior pharynx clear of any exudate or lesions.   Neck: normal, supple, no masses,  no thyromegaly Respiratory:  no wheezing, no crackles. No accessory muscle use.  Cardiovascular: S1 & S2 heard, regular rate and rhythm. No extremity edema.  Abdomen: soft, mild suprapubic tenderness. Bowel sounds active.  Musculoskeletal: no clubbing / cyanosis. No joint deformity upper and lower extremities.   Skin: no significant rashes, lesions, ulcers. Warm, dry, well-perfused. Neurologic: No gross facial asymmetry. Sensation intact. Moving all extremities.  Psychiatric: Alert and oriented to person, place, and situation. Pleasant and cooperative.    Labs and Imaging on Admission: I have personally reviewed following labs and imaging studies  CBC: Recent Labs  Lab 03/21/21 2230 03/21/21 2251  WBC 15.7*  --   NEUTROABS 12.6*  --   HGB 10.0* 11.2*  HCT 32.5* 33.0*  MCV 80.8  --   PLT 344  --    Basic Metabolic Panel: Recent Labs  Lab 03/21/21 2230 03/21/21 2251  NA 135 136  K 6.2* 5.7*  CL 99 103  CO2 21*  --   GLUCOSE 130* 127*  BUN 33* 32*  CREATININE 1.70* 1.40*  CALCIUM 9.8  --     GFR: CrCl cannot be calculated (Unknown ideal weight.). Liver Function Tests: No results for input(s): AST, ALT, ALKPHOS, BILITOT, PROT, ALBUMIN in the last 168 hours. No results for input(s): LIPASE, AMYLASE in the last 168 hours. No results for input(s): AMMONIA in the last 168 hours. Coagulation Profile: Recent Labs  Lab 03/21/21 2230  INR 1.1   Cardiac Enzymes: No results for input(s): CKTOTAL, CKMB, CKMBINDEX, TROPONINI in the last 168 hours. BNP (last 3 results) No results for input(s): PROBNP in the last 8760 hours. HbA1C: No results for input(s): HGBA1C in the last 72 hours. CBG: Recent Labs  Lab 03/22/21 0005  GLUCAP 121*   Lipid Profile: No results for input(s): CHOL, HDL, LDLCALC, TRIG, CHOLHDL, LDLDIRECT in the last 72 hours. Thyroid Function Tests: No results for input(s): TSH, T4TOTAL, FREET4, T3FREE, THYROIDAB in the last 72 hours. Anemia Panel: No results for input(s): VITAMINB12, FOLATE, FERRITIN, TIBC, IRON, RETICCTPCT in the last 72 hours. Urine analysis:    Component Value Date/Time   COLORURINE AMBER (A) 03/21/2021 2243   APPEARANCEUR CLOUDY (A) 03/21/2021 2243   LABSPEC 1.022 03/21/2021 2243   PHURINE 5.0 03/21/2021 2243   GLUCOSEU >=500 (A) 03/21/2021 2243   HGBUR MODERATE (A) 03/21/2021 2243   BILIRUBINUR NEGATIVE 03/21/2021 2243   KETONESUR 20 (A) 03/21/2021 2243   PROTEINUR >=300 (A) 03/21/2021 2243   UROBILINOGEN 1.0 06/30/2014 1213   NITRITE NEGATIVE 03/21/2021 2243   LEUKOCYTESUR LARGE (A) 03/21/2021 2243   Sepsis Labs: @LABRCNTIP (procalcitonin:4,lacticidven:4) ) Recent Results (from the past 240 hour(s))  Resp Panel by RT-PCR (Flu A&B, Covid) Nasopharyngeal Swab     Status: None   Collection Time: 03/21/21  8:40 PM   Specimen: Nasopharyngeal Swab; Nasopharyngeal(NP) swabs in vial transport medium  Result Value Ref Range Status   SARS Coronavirus 2 by RT PCR NEGATIVE NEGATIVE Final    Comment: (NOTE) SARS-CoV-2 target nucleic  acids are NOT DETECTED.  The SARS-CoV-2 RNA is generally detectable in upper respiratory specimens during the acute phase of infection. The lowest concentration of SARS-CoV-2 viral copies this assay can detect is 138 copies/mL. A negative result does not preclude SARS-Cov-2 infection and should not be used as the sole basis for treatment or other patient management decisions. A negative result may occur with  improper specimen collection/handling, submission of specimen other than nasopharyngeal swab, presence of viral mutation(s) within the areas targeted  by this assay, and inadequate number of viral copies(<138 copies/mL). A negative result must be combined with clinical observations, patient history, and epidemiological information. The expected result is Negative.  Fact Sheet for Patients:  EntrepreneurPulse.com.au  Fact Sheet for Healthcare Providers:  IncredibleEmployment.be  This test is no t yet approved or cleared by the [place] FDA and  has been authorized for detection and/or diagnosis of SARS-CoV-2 by FDA under an Emergency Use Authorization (EUA). This EUA will remain  in effect (meaning this test can be used) for the duration of the COVID-19 declaration under Section 564(b)(1) of the Act, 21 U.S.C.section 360bbb-3(b)(1), unless the authorization is terminated  or revoked sooner.       Influenza A by PCR NEGATIVE NEGATIVE Final   Influenza B by PCR NEGATIVE NEGATIVE Final    Comment: (NOTE) The Xpert Xpress SARS-CoV-2/FLU/RSV plus assay is intended as an aid in the diagnosis of influenza from Nasopharyngeal swab specimens and should not be used as a sole basis for treatment. Nasal washings and aspirates are unacceptable for Xpert Xpress SARS-CoV-2/FLU/RSV testing.  Fact Sheet for Patients: EntrepreneurPulse.com.au  Fact Sheet for Healthcare Providers: IncredibleEmployment.be  This  test is not yet approved or cleared by the [place] FDA and has been authorized for detection and/or diagnosis of SARS-CoV-2 by FDA under an Emergency Use Authorization (EUA). This EUA will remain in effect (meaning this test can be used) for the duration of the COVID-19 declaration under Section 564(b)(1) of the Act, 21 U.S.C. section 360bbb-3(b)(1), unless the authorization is terminated or revoked.  Performed at [facility], [place] [address], [place],  [address]      Radiological Exams on Admission: US SCROTUM W/DOPPLER  Result Date: 03/21/2021 CLINICAL DATA:  Left testicular swelling EXAM: SCROTAL ULTRASOUND DOPPLER ULTRASOUND OF THE TESTICLES TECHNIQUE: Complete ultrasound examination of the testicles, epididymis, and other scrotal structures was performed. Color and spectral Doppler ultrasound were also utilized to evaluate blood flow to the testicles. COMPARISON:  CT 11/11/2017 FINDINGS: Right testicle Measurements: 3.2 x 2.6 x 2.2 cm. No mass or microlithiasis visualized. Left testicle Measurements: 3.5 x 2.0 x 2.9 cm. Testicular parenchyma appears somewhat distorted due to compression what appears to be and heterogeneous, solid/cystic paratesticular lentiform lesion or collection measuring 3.9 x 1.3 x 3.3 cm in size. A separate more complex solid/cystic lesion is inferior to the left testicle as well measuring 2.4 x 3 x 3.1 cm in size lobular without internal color Doppler flow. Right epididymis:  Poorly visualized Left epididymis: Poorly visualized portions appear heterogeneous possibly thickened. Hydrocele: Moderate right hydrocele with low level internal echoes/debris. Areas has a more solid liver moderate Varicocele:  None visualized. Pulsed Doppler interrogation of both testes demonstrates normal low resistance arterial and venous waveforms bilaterally. IMPRESSION: Complex layering paratesticular collection versus cystic lesion around the left testicle some compression  of the cystic liver parenchyma additional separate paratesticular heterogeneous mass without internal color vascularity is noted inferior to the left testicle as well. Recommend correlation clinical findings as hematoma or paratesticular abscess could have these appearances. Some questionable thickening and heterogeneity of the left epididymis could reflect epididymitis as well. Moderate right hydrocele with low-level internal echoes, can reflect chronicity versus superinfection as well. Electronically Signed   By: Lovena Le M.D.   On: 03/21/2021 20:05    EKG: Independently reviewed. Sinus tachycardia, rate 103.   Assessment/Plan  1. Paratesticular abscess  - Presents with several days of left scrotal pain and swelling, worsening despite starting  ciprofloxacin 4 days ago, and has leukocytosis and US findings concerning for possible paratesticular abscess  - Urology was consulted by ED physician and Zosyn started  - Continue antibiotics, follow-up on urology recommendations    2. Hyperkalemia; renal insufficiency  - SCr is 1.70, up from 1.06 in 2019; serum potassium is 6.2 without EKG changes  - Continue IVF hydration, renally-dose medications, give Lokelma x1, follow serial potassium levels until normal    3. Chronic combined systolic & diastolic CHF  - EF was 49-66% with grade 1 diastolic dysfunction in 4660  - Appears compensated  - Continue Farxiga and Coreg, continue gentle IVF hydration in setting of increased creatinine, monitor weight and I/Os    4. History of CVA  - Continue Lipitor, hold antiplatelets pending urology consultation   5. Hypothyroidism  - Continue Synthroid      6. Type II DM  - Continue Farxiga, check CBGs and use low-intensity SSI if needed    7. LUTS/BPH  - Continue tamsulosin, finasteride     DVT prophylaxis: SCDs  Code Status: Full  Level of Care: Level of care: Telemetry Medical Family Communication: Daughter updated at bedside Disposition Plan:   Patient is from: Home  Anticipated d/c is to: TBD Anticipated d/c date is: 03/25/21 Patient currently: Pending urology consultation Consults called: Urology  Admission status: Inpatient    Vianne Bulls, MD [facility]  03/22/2021, 2:07 AM

## 2021-03-22 NOTE — ED Notes (Signed)
Attempted to call report. Charge RN to review chart and assign to a nurse. Extension given for call back.

## 2021-03-22 NOTE — Progress Notes (Signed)
Care started prior to midnight in the emergency room and patient was admitted early this morning after midnight by Dr. Christia Reading Opyd and I am in current agreement with this assessment and plan.  Additional changes to the plan of care been made accordingly.  The patient is a 75 year old elderly African-American male with a past medical history significant for but not limited to type 2 diabetes mellitus, hypertension, hypothyroidism, BPH, chronic combined systolic and diastolic CHF as well as other comorbidities who presented to the ED for evaluation of worsening left scrotal pain and swelling despite starting ciprofloxacin on 03/18/2021.  He stated that symptoms began about 5 to 7 days ago with tenderness involving his left testicle.  Patient was evaluated on 520 for this and started on ciprofloxacin but has experienced progressive pain and swelling despite this.  He denies any nausea but did report some mild lower abdominal discomfort.  He denies any fevers or chills for this and upon presentation to the ED he is found to be afebrile and a scrotal ultrasound revealed a complex layering peritesticular collection versus cystic lesion on the left testicle as well as question of epididymitis on ultrasound.  His WBC was elevated at 15.7.  He was initiated on IV Zosyn and also given a liter normal saline and Urology was consulted for further evaluation.  We will obtain a UDS given his history of cocaine at the request of the urology service.  Urology evaluated and feels that he is clinically improving on IV antibiotics and recommending holding his Plavix and continue to monitor for now and reassess the need for ID when Plavix has cleared on the system.  Per urology if he improves on IV antibiotics he may be little treated with a prolonged course of p.o. antibiotics without the need for surgery and given his BPH with bladder outlet obstruction and incomplete emptying despite prior UroLift and current finasteride and  tamsulosin they have ordered a Foley catheter placement.  We will continue IV Zosyn for now and follow-up on further urology recommendations.  Patient's hyperkalemia is improving and his renal insufficiency is also improved after IV fluid hydration and Lokelma.  We will need to continue to monitor his volume status given his EF of 30 to 35% with grade 1 diastolic dysfunction.  In terms of his history of CVA we will hold his Plavix but continue Lipitor.  We will need to continue to monitor the patient's clinical response to intervention and repeat blood work in the AM and follow Cx's and Urology Recc's.

## 2021-03-23 ENCOUNTER — Other Ambulatory Visit: Payer: Self-pay | Admitting: Urology

## 2021-03-23 ENCOUNTER — Inpatient Hospital Stay (HOSPITAL_COMMUNITY): Payer: Medicare Other

## 2021-03-23 DIAGNOSIS — Z8673 Personal history of transient ischemic attack (TIA), and cerebral infarction without residual deficits: Secondary | ICD-10-CM | POA: Diagnosis not present

## 2021-03-23 DIAGNOSIS — I5042 Chronic combined systolic (congestive) and diastolic (congestive) heart failure: Secondary | ICD-10-CM | POA: Diagnosis not present

## 2021-03-23 DIAGNOSIS — N492 Inflammatory disorders of scrotum: Secondary | ICD-10-CM | POA: Diagnosis not present

## 2021-03-23 DIAGNOSIS — E875 Hyperkalemia: Secondary | ICD-10-CM | POA: Diagnosis not present

## 2021-03-23 LAB — POTASSIUM
Potassium: 4.2 mmol/L (ref 3.5–5.1)
Potassium: 4.1 mmol/L (ref 3.5–5.1)

## 2021-03-23 LAB — COMPREHENSIVE METABOLIC PANEL
ALT: 14 U/L (ref 0–44)
AST: 18 U/L (ref 15–41)
Albumin: 2.7 g/dL — ABNORMAL LOW (ref 3.5–5.0)
Alkaline Phosphatase: 55 U/L (ref 38–126)
Anion gap: 13 (ref 5–15)
BUN: 24 mg/dL — ABNORMAL HIGH (ref 8–23)
CO2: 21 mmol/L — ABNORMAL LOW (ref 22–32)
Calcium: 9.6 mg/dL (ref 8.9–10.3)
Chloride: 103 mmol/L (ref 98–111)
Creatinine, Ser: 1.11 mg/dL (ref 0.61–1.24)
GFR, Estimated: >60 mL/min (ref >60)
Glucose, Bld: 96 mg/dL (ref 70–99)
Potassium: 4.2 mmol/L (ref 3.5–5.1)
Sodium: 137 mmol/L (ref 135–145)
Total Bilirubin: 1 mg/dL (ref 0.3–1.2)
Total Protein: 8 g/dL (ref 6.5–8.1)

## 2021-03-23 LAB — GLUCOSE, CAPILLARY
Glucose-Capillary: 99 mg/dL (ref 70–99)
Glucose-Capillary: 111 mg/dL — ABNORMAL HIGH (ref 70–99)
Glucose-Capillary: 175 mg/dL — ABNORMAL HIGH (ref 70–99)
Glucose-Capillary: 100 mg/dL — ABNORMAL HIGH (ref 70–99)
Glucose-Capillary: 145 mg/dL — ABNORMAL HIGH (ref 70–99)

## 2021-03-23 LAB — CBC WITH DIFFERENTIAL/PLATELET
Abs Immature Granulocytes: 0.06 10*3/uL (ref 0.00–0.07)
Basophils Absolute: 0 10*3/uL (ref 0.0–0.1)
Basophils Relative: 0 %
Eosinophils Absolute: 0.2 10*3/uL (ref 0.0–0.5)
Eosinophils Relative: 2 %
HCT: 32 % — ABNORMAL LOW (ref 39.0–52.0)
Hemoglobin: 10 g/dL — ABNORMAL LOW (ref 13.0–17.0)
Immature Granulocytes: 1 %
Lymphocytes Relative: 22 %
Lymphs Abs: 2.3 10*3/uL (ref 0.7–4.0)
MCH: 24.8 pg — ABNORMAL LOW (ref 26.0–34.0)
MCHC: 31.3 g/dL (ref 30.0–36.0)
MCV: 79.2 fL — ABNORMAL LOW (ref 80.0–100.0)
Monocytes Absolute: 0.9 10*3/uL (ref 0.1–1.0)
Monocytes Relative: 8 %
Neutro Abs: 7.2 10*3/uL (ref 1.7–7.7)
Neutrophils Relative %: 67 %
Platelets: 428 10*3/uL — ABNORMAL HIGH (ref 150–400)
RBC: 4.04 MIL/uL — ABNORMAL LOW (ref 4.22–5.81)
RDW: 17.4 % — ABNORMAL HIGH (ref 11.5–15.5)
WBC: 10.6 10*3/uL — ABNORMAL HIGH (ref 4.0–10.5)
nRBC: 0 % (ref 0.0–0.2)

## 2021-03-23 LAB — FOLATE: Folate: 11.6 ng/mL (ref >5.9)

## 2021-03-23 LAB — IRON AND TIBC
Iron: 20 ug/dL — ABNORMAL LOW (ref 45–182)
Saturation Ratios: 9 % — ABNORMAL LOW (ref 17.9–39.5)
TIBC: 231 ug/dL — ABNORMAL LOW (ref 250–450)
UIBC: 211 ug/dL

## 2021-03-23 LAB — MAGNESIUM: Magnesium: 2 mg/dL (ref 1.7–2.4)

## 2021-03-23 LAB — RETICULOCYTES
Immature Retic Fract: 15.8 % (ref 2.3–15.9)
RBC.: 4.05 MIL/uL — ABNORMAL LOW (ref 4.22–5.81)
Retic Count, Absolute: 57.5 10*3/uL (ref 19.0–186.0)
Retic Ct Pct: 1.4 % (ref 0.4–3.1)

## 2021-03-23 LAB — VITAMIN B12: Vitamin B-12: 939 pg/mL — ABNORMAL HIGH (ref 180–914)

## 2021-03-23 LAB — URINE CULTURE: Culture: NO GROWTH

## 2021-03-23 LAB — FERRITIN: Ferritin: 311 ng/mL (ref 24–336)

## 2021-03-23 LAB — PHOSPHORUS: Phosphorus: 3.7 mg/dL (ref 2.5–4.6)

## 2021-03-23 MED ORDER — ENSURE ENLIVE PO LIQD
237.0000 mL | Freq: Two times a day (BID) | ORAL | Status: DC
  Administered 2021-03-23 – 2021-03-30 (×7): 237 mL via ORAL

## 2021-03-23 MED ORDER — POLYETHYLENE GLYCOL 3350 17 G PO PACK
17.0000 g | PACK | Freq: Two times a day (BID) | ORAL | Status: DC
  Administered 2021-03-23 – 2021-03-30 (×14): 17 g via ORAL
  Filled 2021-03-23 (×14): qty 1

## 2021-03-23 MED ORDER — INSULIN ASPART 100 UNIT/ML IJ SOLN
0.0000 [IU] | Freq: Three times a day (TID) | INTRAMUSCULAR | Status: DC
Start: 2021-03-23 — End: 2021-03-30
  Administered 2021-03-24 – 2021-03-25 (×3): 1 [IU] via SUBCUTANEOUS
  Administered 2021-03-26: 3 [IU] via SUBCUTANEOUS
  Administered 2021-03-28: 2 [IU] via SUBCUTANEOUS
  Administered 2021-03-28 – 2021-03-29 (×3): 1 [IU] via SUBCUTANEOUS
  Administered 2021-03-30: 2 [IU] via SUBCUTANEOUS

## 2021-03-23 MED ORDER — ADULT MULTIVITAMIN W/MINERALS CH
1.0000 | ORAL_TABLET | Freq: Every day | ORAL | Status: DC
  Administered 2021-03-23 – 2021-03-30 (×7): 1 via ORAL
  Filled 2021-03-23 (×6): qty 1

## 2021-03-23 NOTE — Progress Notes (Signed)
Initial Nutrition Assessment  DOCUMENTATION CODES:  Non-severe (moderate) malnutrition in context of chronic illness  INTERVENTION:  Please weigh patient when possible.  Add Ensure Enlive po BID, each supplement provides 350 kcal and 20 grams of protein.  Add Magic cup TID with meals, each supplement provides 290 kcal and 9 grams of protein.  Add MVI with minerals daily.  NUTRITION DIAGNOSIS:  Moderate Malnutrition related to chronic illness as evidenced by moderate fat depletion,moderate muscle depletion,severe muscle depletion.  GOAL:  Patient will meet greater than or equal to 90% of their needs  MONITOR:  PO intake,Supplement acceptance,Labs,Weight trends,Skin,I & O's  REASON FOR ASSESSMENT:  Malnutrition Screening Tool    ASSESSMENT:  75 yo male with a PMH of T2DM, HTN, hypothyroidism, BPH, and chronic systolic and diastolic CHF who presents with scrotal abscess.  Spoke with pt and wife at bedside. Wife reports that pt has great appetite normally and eats very well at home. 3-4 days PTA, pt had no appetite and she had to encourage him to eat/drink. Pt has good appetite again, as RD observed pt ate 100% of breakfast tray.  She thinks pt may have lost 5 lbs, but she is unsure. Epic has no current weight history as last updated weight was from 2019. Recommend obtaining new weight when possible. RD to use IBW to estimate needs.  On exam, pt has moderate depletions throughout and severe depletions in BLE, but wife said pt is mostly wheelchair dependent, and this would allow those muscles to deplete faster if they are not used.  Recommend Ensure BID and Magic Cup TID to promote caloric and protein intake. Also recommend MVI with minerals daily.  Medications: reviewed; SSI, Zosyn per IV  Labs: reviewed; CBG 99-162 HbA1c: 5.3% (02/2021)  NUTRITION - FOCUSED PHYSICAL EXAM: Flowsheet Row Most Recent Value  Orbital Region Moderate depletion  Upper Arm Region Moderate depletion   Thoracic and Lumbar Region No depletion  Buccal Region Moderate depletion  Temple Region Moderate depletion  Clavicle Bone Region Moderate depletion  Clavicle and Acromion Bone Region Moderate depletion  Scapular Bone Region Unable to assess  Dorsal Hand Moderate depletion  Patellar Region Severe depletion  Anterior Thigh Region Severe depletion  Posterior Calf Region Severe depletion  Edema (RD Assessment) None  Hair Reviewed  Eyes Reviewed  Mouth Reviewed  Skin Reviewed  Nails Reviewed     Diet Order:   Diet Order            Diet Heart Room service appropriate? Yes; Fluid consistency: Thin  Diet effective now                EDUCATION NEEDS:  Education needs have been addressed  Skin:  Skin Assessment: Reviewed RN Assessment (Dry, cracking on L buttocks)  Last BM:  PTA/unknown  Height:  Ht Readings from Last 1 Encounters:  03/15/18 5' 3.5" (1.613 m)   Weight:  Wt Readings from Last 1 Encounters:  03/15/18 83.9 kg   Ideal Body Weight:  57.7 kg  BMI:  There is no height or weight on file to calculate BMI.  Estimated Nutritional Needs: using IBW Kcal:  1700-1900 Protein:  75-90 grams Fluid:  >1.7 L  Derrel Nip, RD, LDN Registered Dietitian After Hours/Weekend Pager # in [place]

## 2021-03-23 NOTE — Progress Notes (Signed)
PROGRESS NOTE    Brad Harrell  CLE:[id number] DOB: 26-Apr-1946 DOA: 03/21/2021 PCP: Nolene Ebbs, MD   Brief Narrative: Brad Harrell is a 75 y.o. male with a history of diabetes mellitus type 2, hypertension, hypothyroidism, BPH, chronic combined systolic and diastolic heart failure.  Patient presented secondary to left swelling with concern for scrotal abscess on initial imaging.  Urology was consulted on admission.  Empiric IV antibiotics initiated as well.   Assessment & Plan:   Principal Problem:   Scrotal abscess Active Problems:   Urinary retention   History of ischemic stroke   Renal insufficiency   Hyperkalemia   Chronic combined systolic and diastolic CHF (congestive heart failure) (HCC)   Hypothyroidism   Left epididymo-orchitis with a possible abcess or pyocele Urology consulted on admission. Started on empiric Zosyn. Urology recommending exploratory surgery on 5/27 with consideration for possible orchiectomy. -Continue Zosyn -Urology recommendations: surgery on 5/27.   Urinary retention In setting of known BPH.  Urology is on board as mentioned above.  Foley placed on 5/24. -Urology recommendations: Pelvis ultrasound  BPH Patient follows with urology as an outpatient.  He is currently on finasteride, Cardura and Flomax  Hyperkalemia Potassium 6.2 admission likely secondary to AKI.  Resolved with IV fluids and Lokelma  AKI Previous baseline about 1 from 2 years ago.  Creatinine of 1.7 on admission.  No imaging performed on admission to rule out hydronephrosis but has resolved with IV fluids.  Chronic combined systolic and diastolic heart failure Last Transthoracic Echocardiogram from 2015. Not on heart failure medications as an outpatient. Currently euvolemic -Transthoracic Echocardiogram in anticipation of upcoming surgery  History of CVA Patient with resultant cognitive/memory impairment. Currently oriented mostly, however. Patient is on Lipitor  as an outpatient.  Aspirin and Plavix held secondary to planned surgery -Continue Lipitor  Hypothyroidism -Continue Synthroid  Diabetes mellitus, type 2 Patient is on Farxiga and metformin as an outpatient. Hemoglobin A1C 5.3% on this admission. Very well controlled. -Continue SSI   DVT prophylaxis: SCDs Code Status:   Code Status: Full Code Family Communication: Daughter at bedside Disposition Plan: Discharge pending urology recommendations/management, transition to oral antibiotics if needed   Consultants:   Urology  Procedures:   None  Antimicrobials:  Zosyn IV   Subjective: Patient reports no issues currently.  Pain seems to be improved.  Clinically.  Objective: Vitals:   03/22/21 1931 03/23/21 0145 03/23/21 0518 03/23/21 1523  BP: 125/72 (!) 143/78 116/75 120/67  Pulse: 94 90 (!) 103 89  Resp:  18 18 15   Temp: (!) 97.5 F (36.4 C) 97.7 F (36.5 C) 98.2 F (36.8 C) 98.4 F (36.9 C)  TempSrc: Oral Oral Oral Oral  SpO2: 99% 99% 100% 99%    Intake/Output Summary (Last 24 hours) at 03/23/2021 1600 Last data filed at 03/23/2021 1230 Gross per 24 hour  Intake 673.04 ml  Output 900 ml  Net -226.96 ml   There were no vitals filed for this visit.  Examination:  General exam: Appears calm and comfortable  Respiratory system: Clear to auscultation. Respiratory effort normal. Cardiovascular system: S1 & S2 heard, RRR. No murmurs, rubs, gallops or clicks. Gastrointestinal system: Abdomen is nondistended, soft and nontender. No organomegaly or masses felt. Normal bowel sounds heard. Central nervous system: Alert and oriented to person, somewhat to place (stated he is at [place] long rather than at [facility]) and year.  Not oriented to date and month. No focal neurological deficits. Musculoskeletal: No edema. No  calf tenderness Skin: No cyanosis. No rashes Psychiatry: Judgement and insight appear slightly impaired.  Blunt affect.    Data Reviewed: I  have personally reviewed following labs and imaging studies  CBC Lab Results  Component Value Date   WBC 10.6 (H) 03/23/2021   RBC 4.04 (L) 03/23/2021   RBC 4.05 (L) 03/23/2021   HGB 10.0 (L) 03/23/2021   HCT 32.0 (L) 03/23/2021   MCV 79.2 (L) 03/23/2021   MCH 24.8 (L) 03/23/2021   PLT 428 (H) 03/23/2021   MCHC 31.3 03/23/2021   RDW 17.4 (H) 03/23/2021   LYMPHSABS 2.3 03/23/2021   MONOABS 0.9 03/23/2021   EOSABS 0.2 03/23/2021   BASOSABS 0.0 24/58/0998     Last metabolic panel Lab Results  Component Value Date   NA 137 03/23/2021   K 4.1 03/23/2021   CL 103 03/23/2021   CO2 21 (L) 03/23/2021   BUN 24 (H) 03/23/2021   CREATININE 1.11 03/23/2021   GLUCOSE 96 03/23/2021   GFRNONAA >60 03/23/2021   GFRAA >60 11/11/2017   CALCIUM 9.6 03/23/2021   PHOS 3.7 03/23/2021   PROT 8.0 03/23/2021   ALBUMIN 2.7 (L) 03/23/2021   BILITOT 1.0 03/23/2021   ALKPHOS 55 03/23/2021   AST 18 03/23/2021   ALT 14 03/23/2021   ANIONGAP 13 03/23/2021    CBG (last 3)  Recent Labs    03/23/21 0514 03/23/21 0806 03/23/21 1222  GLUCAP 99 111* 175*     GFR: CrCl cannot be calculated (Unknown ideal weight.).  Coagulation Profile: Recent Labs  Lab 03/21/21 2230  INR 1.1    Recent Results (from the past 240 hour(s))  Resp Panel by RT-PCR (Flu A&B, Covid) Nasopharyngeal Swab     Status: None   Collection Time: 03/21/21  8:40 PM   Specimen: Nasopharyngeal Swab; Nasopharyngeal(NP) swabs in vial transport medium  Result Value Ref Range Status   SARS Coronavirus 2 by RT PCR NEGATIVE NEGATIVE Final    Comment: (NOTE) SARS-CoV-2 target nucleic acids are NOT DETECTED.  The SARS-CoV-2 RNA is generally detectable in upper respiratory specimens during the acute phase of infection. The lowest concentration of SARS-CoV-2 viral copies this assay can detect is 138 copies/mL. A negative result does not preclude SARS-Cov-2 infection and should not be used as the sole basis for treatment  or other patient management decisions. A negative result may occur with  improper specimen collection/handling, submission of specimen other than nasopharyngeal swab, presence of viral mutation(s) within the areas targeted by this assay, and inadequate number of viral copies(<138 copies/mL). A negative result must be combined with clinical observations, patient history, and epidemiological information. The expected result is Negative.  Fact Sheet for Patients:  EntrepreneurPulse.com.au  Fact Sheet for Healthcare Providers:  IncredibleEmployment.be  This test is no t yet approved or cleared by the [place] FDA and  has been authorized for detection and/or diagnosis of SARS-CoV-2 by FDA under an Emergency Use Authorization (EUA). This EUA will remain  in effect (meaning this test can be used) for the duration of the COVID-19 declaration under Section 564(b)(1) of the Act, 21 U.S.C.section 360bbb-3(b)(1), unless the authorization is terminated  or revoked sooner.       Influenza A by PCR NEGATIVE NEGATIVE Final   Influenza B by PCR NEGATIVE NEGATIVE Final    Comment: (NOTE) The Xpert Xpress SARS-CoV-2/FLU/RSV plus assay is intended as an aid in the diagnosis of influenza from Nasopharyngeal swab specimens and should not be used as a sole basis for  treatment. Nasal washings and aspirates are unacceptable for Xpert Xpress SARS-CoV-2/FLU/RSV testing.  Fact Sheet for Patients: EntrepreneurPulse.com.au  Fact Sheet for Healthcare Providers: IncredibleEmployment.be  This test is not yet approved or cleared by the [place] FDA and has been authorized for detection and/or diagnosis of SARS-CoV-2 by FDA under an Emergency Use Authorization (EUA). This EUA will remain in effect (meaning this test can be used) for the duration of the COVID-19 declaration under Section 564(b)(1) of the Act, 21 U.S.C. section  360bbb-3(b)(1), unless the authorization is terminated or revoked.  Performed at [facility], [place] [address] [place], [place] [address]   Urine culture     Status: None   Collection Time: 03/21/21 10:43 PM   Specimen: Urine, Random  Result Value Ref Range Status   Specimen Description URINE, RANDOM  Final   Special Requests NONE  Final   Culture   Final    NO GROWTH Performed at [facility], [place] [address] [place], [place] [address]    Report Status 03/23/2021 FINAL  Final        Radiology Studies: US SCROTUM W/DOPPLER  Result Date: 03/21/2021 CLINICAL DATA:  Left testicular swelling EXAM: SCROTAL ULTRASOUND DOPPLER ULTRASOUND OF THE TESTICLES TECHNIQUE: Complete ultrasound examination of the testicles, epididymis, and other scrotal structures was performed. Color and spectral Doppler ultrasound were also utilized to evaluate blood flow to the testicles. COMPARISON:  CT 11/11/2017 FINDINGS: Right testicle Measurements: 3.2 x 2.6 x 2.2 cm. No mass or microlithiasis visualized. Left testicle Measurements: 3.5 x 2.0 x 2.9 cm. Testicular parenchyma appears somewhat distorted due to compression what appears to be and heterogeneous, solid/cystic paratesticular lentiform lesion or collection measuring 3.9 x 1.3 x 3.3 cm in size. A separate more complex solid/cystic lesion is inferior to the left testicle as well measuring 2.4 x 3 x 3.1 cm in size lobular without internal color Doppler flow. Right epididymis:  Poorly visualized Left epididymis: Poorly visualized portions appear heterogeneous possibly thickened. Hydrocele: Moderate right hydrocele with low level internal echoes/debris. Areas has a more solid liver moderate Varicocele:  None visualized. Pulsed Doppler interrogation of both testes demonstrates normal low resistance arterial and venous waveforms bilaterally. IMPRESSION: Complex layering paratesticular collection versus cystic lesion around the left testicle some  compression of the cystic liver parenchyma additional separate paratesticular heterogeneous mass without internal color vascularity is noted inferior to the left testicle as well. Recommend correlation clinical findings as hematoma or paratesticular abscess could have these appearances. Some questionable thickening and heterogeneity of the left epididymis could reflect epididymitis as well. Moderate right hydrocele with low-level internal echoes, can reflect chronicity versus superinfection as well. Electronically Signed   By: Lovena Le M.D.   On: 03/21/2021 20:05        Scheduled Meds: . atorvastatin  20 mg Oral Daily  . carvedilol  3.125 mg Oral BID WC  . Chlorhexidine Gluconate Cloth  6 each Topical Daily  . dapagliflozin propanediol  5 mg Oral Daily  . feeding supplement  237 mL Oral BID BM  . finasteride  5 mg Oral Daily  . insulin aspart  0-6 Units Subcutaneous TID WC  . levothyroxine  100 mcg Oral QAC breakfast  . multivitamin with minerals  1 tablet Oral Daily  . polyethylene glycol  17 g Oral BID  . sodium chloride flush  10-40 mL Intracatheter Q12H  . tamsulosin  0.4 mg Oral BID   Continuous Infusions: . piperacillin-tazobactam (ZOSYN)  IV 3.375 g (  03/23/21 0936)     LOS: 1 day     Cordelia Poche, MD [facility] 03/23/2021, 4:00 PM  If 7PM-7AM, please contact night-coverage [email or web address]

## 2021-03-23 NOTE — Progress Notes (Signed)
Subjective: Mr. Pepper is doing better to day.  He had a foley placed for a PVR of 369ml and has known BPH with prior Urolift and currently on finasteride and an alpha blocker.  His Cr has declined with the foley.   He hasn't noticed any worsening of the scrotal discomfort or swelling.  He still has a mild leukocytosis.   His urine culture was negative but he was on Cipro.  His tox screen was negative.  ROS:  Review of Systems  Constitutional: Negative for chills and fever.  Respiratory: Negative for shortness of breath.     Anti-infectives: Anti-infectives (From admission, onward)   Start     Dose/Rate Route Frequency Ordered Stop   03/22/21 0800  piperacillin-tazobactam (ZOSYN) IVPB 3.375 g        3.375 g 12.5 mL/hr over 240 Minutes Intravenous Every 8 hours 03/22/21 0000     03/22/21 0015  piperacillin-tazobactam (ZOSYN) IVPB 3.375 g        3.375 g 100 mL/hr over 30 Minutes Intravenous STAT 03/22/21 0000 03/22/21 0227      Current Facility-Administered Medications  Medication Dose Route Frequency Provider Last Rate Last Admin  . acetaminophen (TYLENOL) tablet 650 mg  650 mg Oral Q6H PRN Opyd, Ilene Qua, MD   650 mg at 03/22/21 1749   Or  . acetaminophen (TYLENOL) suppository 650 mg  650 mg Rectal Q6H PRN Opyd, Ilene Qua, MD      . atorvastatin (LIPITOR) tablet 20 mg  20 mg Oral Daily Opyd, Ilene Qua, MD   20 mg at 03/22/21 1008  . carvedilol (COREG) tablet 3.125 mg  3.125 mg Oral BID WC Opyd, Ilene Qua, MD   3.125 mg at 03/22/21 1749  . Chlorhexidine Gluconate Cloth 2 % PADS 6 each  6 each Topical Daily Opyd, Ilene Qua, MD      . cyclobenzaprine (FLEXERIL) tablet 10 mg  10 mg Oral TID PRN Vianne Bulls, MD   10 mg at 03/22/21 2235  . dapagliflozin propanediol (FARXIGA) tablet 5 mg  5 mg Oral Daily Opyd, Ilene Qua, MD   5 mg at 03/22/21 1008  . fentaNYL (SUBLIMAZE) injection 25-50 mcg  25-50 mcg Intravenous Q2H PRN Opyd, Ilene Qua, MD   50 mcg at 03/22/21 1152  . finasteride  (PROSCAR) tablet 5 mg  5 mg Oral Daily Opyd, Ilene Qua, MD   5 mg at 03/22/21 1009  . insulin aspart (novoLOG) injection 0-6 Units  0-6 Units Subcutaneous Q4H Opyd, Ilene Qua, MD      . levothyroxine (SYNTHROID) tablet 100 mcg  100 mcg Oral QAC breakfast Opyd, Ilene Qua, MD   100 mcg at 03/22/21 6389  . ondansetron (ZOFRAN) tablet 4 mg  4 mg Oral Q6H PRN Opyd, Ilene Qua, MD       Or  . ondansetron (ZOFRAN) injection 4 mg  4 mg Intravenous Q6H PRN Opyd, Ilene Qua, MD      . piperacillin-tazobactam (ZOSYN) IVPB 3.375 g  3.375 g Intravenous Q8H Opyd, Ilene Qua, MD 12.5 mL/hr at 03/23/21 0046 Restarted at 03/23/21 0046  . senna-docusate (Senokot-S) tablet 1 tablet  1 tablet Oral QHS PRN Opyd, Ilene Qua, MD      . sodium chloride flush (NS) 0.9 % injection 10-40 mL  10-40 mL Intracatheter Q12H Opyd, Ilene Qua, MD   10 mL at 03/22/21 2235  . sodium chloride flush (NS) 0.9 % injection 10-40 mL  10-40 mL Intracatheter PRN Opyd, Ilene Qua, MD      .  tamsulosin (FLOMAX) capsule 0.4 mg  0.4 mg Oral BID Opyd, Ilene Qua, MD   0.4 mg at 03/22/21 2235     Objective: Vital signs in last 24 hours: Temp:  [97.5 F (36.4 C)-98.3 F (36.8 C)] 98.2 F (36.8 C) (05/25 0518) Pulse Rate:  [90-125] 103 (05/25 0518) Resp:  [15-28] 18 (05/25 0518) BP: (97-167)/(52-85) 116/75 (05/25 0518) SpO2:  [97 %-100 %] 100 % (05/25 0518)  Intake/Output from previous day: 05/24 0701 - 05/25 0700 In: 263 [P.O.:120; I.V.:10; IV Piggyback:133] Out: 900 [Urine:900] Intake/Output this shift: No intake/output data recorded.   Physical Exam Vitals reviewed.  Constitutional:      Appearance: Normal appearance.  Genitourinary:    Comments: He has stable induration of the left scrotum and testicle without progression or improvement.   Neurological:     Mental Status: He is alert.     Lab Results:  Recent Labs    03/22/21 0435 03/23/21 0258  WBC 9.9 10.6*  HGB 8.9* 10.0*  HCT 29.6* 32.0*  PLT 398 428*    BMET Recent Labs    03/22/21 0435 03/22/21 1111 03/22/21 2122 03/23/21 0258  NA 138  --   --  137  K 4.4   < > 4.4 4.2  CL 104  --   --  103  CO2 19*  --   --  21*  GLUCOSE 121*  --   --  96  BUN 31*  --   --  24*  CREATININE 1.48*  --   --  1.11  CALCIUM 9.3  --   --  9.6   < > = values in this interval not displayed.   PT/INR Recent Labs    03/21/21 2230  LABPROT 14.3  INR 1.1   ABG No results for input(s): PHART, HCO3 in the last 72 hours.  Invalid input(s): PCO2, PO2  Studies/Results: US SCROTUM W/DOPPLER  Result Date: 03/21/2021 CLINICAL DATA:  Left testicular swelling EXAM: SCROTAL ULTRASOUND DOPPLER ULTRASOUND OF THE TESTICLES TECHNIQUE: Complete ultrasound examination of the testicles, epididymis, and other scrotal structures was performed. Color and spectral Doppler ultrasound were also utilized to evaluate blood flow to the testicles. COMPARISON:  CT 11/11/2017 FINDINGS: Right testicle Measurements: 3.2 x 2.6 x 2.2 cm. No mass or microlithiasis visualized. Left testicle Measurements: 3.5 x 2.0 x 2.9 cm. Testicular parenchyma appears somewhat distorted due to compression what appears to be and heterogeneous, solid/cystic paratesticular lentiform lesion or collection measuring 3.9 x 1.3 x 3.3 cm in size. A separate more complex solid/cystic lesion is inferior to the left testicle as well measuring 2.4 x 3 x 3.1 cm in size lobular without internal color Doppler flow. Right epididymis:  Poorly visualized Left epididymis: Poorly visualized portions appear heterogeneous possibly thickened. Hydrocele: Moderate right hydrocele with low level internal echoes/debris. Areas has a more solid liver moderate Varicocele:  None visualized. Pulsed Doppler interrogation of both testes demonstrates normal low resistance arterial and venous waveforms bilaterally. IMPRESSION: Complex layering paratesticular collection versus cystic lesion around the left testicle some compression of the  cystic liver parenchyma additional separate paratesticular heterogeneous mass without internal color vascularity is noted inferior to the left testicle as well. Recommend correlation clinical findings as hematoma or paratesticular abscess could have these appearances. Some questionable thickening and heterogeneity of the left epididymis could reflect epididymitis as well. Moderate right hydrocele with low-level internal echoes, can reflect chronicity versus superinfection as well. Electronically Signed   By: Lovena Le M.D.   On:  03/21/2021 20:05     Assessment and Plan: Left epididymo-orchitis with a possible abcess or pyocele.   He is no worse, but no better,  He is probably going to need to have left scrotal exploration with probable orchiectomy.   I will see what I can find from a scheduling standpoint.   BPH with urinary retention.   His prostate volume was 16ml on CT in 2015 which is a bit large for a TURP, but he has been on finasteride.  He will need cystoscopy at the time of orchiectomy and I could consider a TURP, but will need to get a pelvic US to measure prostate volume first.  I will place the order for that.         LOS: 1 day    [name] 03/23/2021 [phone] ID: [name], male   DOB: 01-Dec-1945, 75 y.o.   MRN: [id number]

## 2021-03-24 ENCOUNTER — Inpatient Hospital Stay (HOSPITAL_COMMUNITY): Payer: Medicare Other

## 2021-03-24 DIAGNOSIS — Z8673 Personal history of transient ischemic attack (TIA), and cerebral infarction without residual deficits: Secondary | ICD-10-CM | POA: Diagnosis not present

## 2021-03-24 DIAGNOSIS — N492 Inflammatory disorders of scrotum: Secondary | ICD-10-CM | POA: Diagnosis not present

## 2021-03-24 DIAGNOSIS — E440 Moderate protein-calorie malnutrition: Secondary | ICD-10-CM | POA: Insufficient documentation

## 2021-03-24 DIAGNOSIS — I5042 Chronic combined systolic (congestive) and diastolic (congestive) heart failure: Secondary | ICD-10-CM | POA: Diagnosis not present

## 2021-03-24 DIAGNOSIS — E875 Hyperkalemia: Secondary | ICD-10-CM | POA: Diagnosis not present

## 2021-03-24 LAB — GLUCOSE, CAPILLARY
Glucose-Capillary: 142 mg/dL — ABNORMAL HIGH (ref 70–99)
Glucose-Capillary: 152 mg/dL — ABNORMAL HIGH (ref 70–99)
Glucose-Capillary: 160 mg/dL — ABNORMAL HIGH (ref 70–99)
Glucose-Capillary: 149 mg/dL — ABNORMAL HIGH (ref 70–99)

## 2021-03-24 LAB — CBC
HCT: 33.7 % — ABNORMAL LOW (ref 39.0–52.0)
Hemoglobin: 10.4 g/dL — ABNORMAL LOW (ref 13.0–17.0)
MCH: 24.6 pg — ABNORMAL LOW (ref 26.0–34.0)
MCHC: 30.9 g/dL (ref 30.0–36.0)
MCV: 79.7 fL — ABNORMAL LOW (ref 80.0–100.0)
Platelets: 400 10*3/uL (ref 150–400)
RBC: 4.23 MIL/uL (ref 4.22–5.81)
RDW: 17.2 % — ABNORMAL HIGH (ref 11.5–15.5)
WBC: 7.7 10*3/uL (ref 4.0–10.5)
nRBC: 0 % (ref 0.0–0.2)

## 2021-03-24 LAB — ECHOCARDIOGRAM COMPLETE
AR max vel: 3.09 cm2
AV Area VTI: 2.62 cm2
AV Area mean vel: 2.66 cm2
AV Mean grad: 1.9 mmHg
AV Peak grad: 2.8 mmHg
Ao pk vel: 0.84 m/s
Calc EF: 26.6 %
Height: 63 in
S' Lateral: 4.3 cm
Single Plane A2C EF: 33.9 %
Single Plane A4C EF: 19.2 %
Weight: 2843.05 oz

## 2021-03-24 LAB — BASIC METABOLIC PANEL
Anion gap: 11 (ref 5–15)
BUN: 22 mg/dL (ref 8–23)
CO2: 23 mmol/L (ref 22–32)
Calcium: 9.6 mg/dL (ref 8.9–10.3)
Chloride: 104 mmol/L (ref 98–111)
Creatinine, Ser: 1.18 mg/dL (ref 0.61–1.24)
GFR, Estimated: >60 mL/min (ref >60)
Glucose, Bld: 156 mg/dL — ABNORMAL HIGH (ref 70–99)
Potassium: 4.2 mmol/L (ref 3.5–5.1)
Sodium: 138 mmol/L (ref 135–145)

## 2021-03-24 NOTE — Progress Notes (Signed)
The Pelvic US measured a prostate volume of 75ml.  His prior CT in 2019 had a prostate volume of 195ml.  He has been on finasteride so there could be some decline but the true volume is probably in between the two.   My current plan is to go to the OR on 5/27 and do a left scrotal exploration and probable orchiectomy and then do cystoscopy and if feasible, a TURP.  If the prostate is too large for a TURP then we will need to consider an alternative such as a robotic simple prostatectomy if he is felt to be medically fit for that procedure.   He has had a prior Urolift which could complicate a simple prostatectomy.    I will see him this evening.

## 2021-03-24 NOTE — Progress Notes (Signed)
Pt transported to [facility] via [place].

## 2021-03-24 NOTE — Progress Notes (Signed)
Pt off unit to [facility].

## 2021-03-24 NOTE — Progress Notes (Signed)
  Echocardiogram 2D Echocardiogram has been performed.  Brad Harrell 03/24/2021, 2:44 PM

## 2021-03-24 NOTE — Progress Notes (Signed)
Called and gave report to RN on 4E Progressive/Uro at [facility]

## 2021-03-24 NOTE — H&P (View-Only) (Signed)
The Pelvic US measured a prostate volume of 65ml.  His prior CT in 2019 had a prostate volume of 145ml.  He has been on finasteride so there could be some decline but the true volume is probably in between the two.   My current plan is to go to the OR on 5/27 and do a left scrotal exploration and probable orchiectomy and then do cystoscopy and if feasible, a TURP.  If the prostate is too large for a TURP then we will need to consider an alternative such as a robotic simple prostatectomy if he is felt to be medically fit for that procedure.   He has had a prior Urolift which could complicate a simple prostatectomy.    I will see him this evening.

## 2021-03-24 NOTE — Progress Notes (Signed)
PROGRESS NOTE    Brad Harrell  MAU:[id number] DOB: 1946-01-25 DOA: 03/21/2021 PCP: Nolene Ebbs, MD   Brief Narrative: Brad Harrell is a 75 y.o. male with a history of diabetes mellitus type 2, hypertension, hypothyroidism, BPH, chronic combined systolic and diastolic heart failure.  Patient presented secondary to left swelling with concern for scrotal abscess on initial imaging.  Urology was consulted on admission.  Empiric IV antibiotics initiated as well.   Assessment & Plan:   Principal Problem:   Scrotal abscess Active Problems:   Urinary retention   History of ischemic stroke   Renal insufficiency   Hyperkalemia   Chronic combined systolic and diastolic CHF (congestive heart failure) (HCC)   Hypothyroidism   Malnutrition of moderate degree   Left epididymo-orchitis with a possible abcess or pyocele Urology consulted on admission. Started on empiric Zosyn. Urology recommending exploratory surgery on 5/27 with consideration for possible orchiectomy. -Continue Zosyn -Urology recommendations: surgery tomorrow.   Urinary retention In setting of known BPH.  Urology is on board as mentioned above.  Foley placed on 5/24. -Urology recommendations: foley catheter; consideration of TURP if able  BPH Patient follows with urology as an outpatient.  He is currently on finasteride, Cardura and Flomax  Hyperkalemia Potassium 6.2 admission likely secondary to AKI.  Resolved with IV fluids and Lokelma  AKI Previous baseline about 1 from 2 years ago.  Creatinine of 1.7 on admission.  No imaging performed on admission to rule out hydronephrosis but has resolved with IV fluids.  Chronic combined systolic and diastolic heart failure Last Transthoracic Echocardiogram from 2015. Not on heart failure medications as an outpatient. Currently euvolemic. Repeat Transthoracic Echocardiogram shows a worsened EF of 25-30% with global hypokinesis -Cardiology consult for  recommendations -Continue Coreg -Will likely require ACEi/ARB  History of CVA Patient with resultant cognitive/memory impairment. Currently oriented mostly, however. Patient is on Lipitor as an outpatient.  Aspirin and Plavix held secondary to planned surgery -Continue Lipitor  Hypothyroidism -Continue Synthroid  Diabetes mellitus, type 2 Patient is on Farxiga and metformin as an outpatient. Hemoglobin A1C 5.3% on this admission. Very well controlled. -Continue SSI   DVT prophylaxis: SCDs Code Status:   Code Status: Full Code Family Communication: None at bedside Disposition Plan: Discharge pending urology recommendations/management, transition to oral antibiotics if needed   Consultants:   Urology  Procedures:   None  Antimicrobials:  Zosyn IV   Subjective: No concerns today.  Objective: Vitals:   03/24/21 0900 03/24/21 1100 03/24/21 1307 03/24/21 1557  BP: 115/70  119/76 116/74  Pulse: 94  (!) 101 93  Resp:   17 18  Temp:   (!) 97.4 F (36.3 C) 97.7 F (36.5 C)  TempSrc:   Oral Oral  SpO2:   99% 100%  Weight:      Height:  5\' 3"  (1.6 m)      Intake/Output Summary (Last 24 hours) at 03/24/2021 1651 Last data filed at 03/24/2021 1218 Gross per 24 hour  Intake 920 ml  Output 1700 ml  Net -780 ml   Filed Weights   03/24/21 0524  Weight: 80.6 kg    Examination:  General exam: Appears calm and comfortable Respiratory system: Clear to auscultation. Respiratory effort normal. Cardiovascular system: S1 & S2 heard, RRR. Gastrointestinal system: Abdomen is nondistended, soft and nontender. No organomegaly or masses felt. Normal bowel sounds heard. Central nervous system: Alert and oriented to person place and year. No focal neurological deficits. Musculoskeletal: No edema. No calf  tenderness Skin: No cyanosis. No rashes Psychiatry: Judgement and insight appear normal. Mood & affect appropriate. Memory seems impaired    Data Reviewed: I have  personally reviewed following labs and imaging studies  CBC Lab Results  Component Value Date   WBC 7.7 03/24/2021   RBC 4.23 03/24/2021   HGB 10.4 (L) 03/24/2021   HCT 33.7 (L) 03/24/2021   MCV 79.7 (L) 03/24/2021   MCH 24.6 (L) 03/24/2021   PLT 400 03/24/2021   MCHC 30.9 03/24/2021   RDW 17.2 (H) 03/24/2021   LYMPHSABS 2.3 03/23/2021   MONOABS 0.9 03/23/2021   EOSABS 0.2 03/23/2021   BASOSABS 0.0 35/32/9924     Last metabolic panel Lab Results  Component Value Date   NA 138 03/24/2021   K 4.2 03/24/2021   CL 104 03/24/2021   CO2 23 03/24/2021   BUN 22 03/24/2021   CREATININE 1.18 03/24/2021   GLUCOSE 156 (H) 03/24/2021   GFRNONAA >60 03/24/2021   GFRAA >60 11/11/2017   CALCIUM 9.6 03/24/2021   PHOS 3.7 03/23/2021   PROT 8.0 03/23/2021   ALBUMIN 2.7 (L) 03/23/2021   BILITOT 1.0 03/23/2021   ALKPHOS 55 03/23/2021   AST 18 03/23/2021   ALT 14 03/23/2021   ANIONGAP 11 03/24/2021    CBG (last 3)  Recent Labs    03/23/21 2004 03/24/21 0701 03/24/21 1202  GLUCAP 145* 142* 152*     GFR: Estimated Creatinine Clearance: 49.2 mL/min (by C-G formula based on SCr of 1.18 mg/dL).  Coagulation Profile: Recent Labs  Lab 03/21/21 2230  INR 1.1    Recent Results (from the past 240 hour(s))  Resp Panel by RT-PCR (Flu A&B, Covid) Nasopharyngeal Swab     Status: None   Collection Time: 03/21/21  8:40 PM   Specimen: Nasopharyngeal Swab; Nasopharyngeal(NP) swabs in vial transport medium  Result Value Ref Range Status   SARS Coronavirus 2 by RT PCR NEGATIVE NEGATIVE Final    Comment: (NOTE) SARS-CoV-2 target nucleic acids are NOT DETECTED.  The SARS-CoV-2 RNA is generally detectable in upper respiratory specimens during the acute phase of infection. The lowest concentration of SARS-CoV-2 viral copies this assay can detect is 138 copies/mL. A negative result does not preclude SARS-Cov-2 infection and should not be used as the sole basis for treatment or other  patient management decisions. A negative result may occur with  improper specimen collection/handling, submission of specimen other than nasopharyngeal swab, presence of viral mutation(s) within the areas targeted by this assay, and inadequate number of viral copies(<138 copies/mL). A negative result must be combined with clinical observations, patient history, and epidemiological information. The expected result is Negative.  Fact Sheet for Patients:  EntrepreneurPulse.com.au  Fact Sheet for Healthcare Providers:  IncredibleEmployment.be  This test is no t yet approved or cleared by the [place] FDA and  has been authorized for detection and/or diagnosis of SARS-CoV-2 by FDA under an Emergency Use Authorization (EUA). This EUA will remain  in effect (meaning this test can be used) for the duration of the COVID-19 declaration under Section 564(b)(1) of the Act, 21 U.S.C.section 360bbb-3(b)(1), unless the authorization is terminated  or revoked sooner.       Influenza A by PCR NEGATIVE NEGATIVE Final   Influenza B by PCR NEGATIVE NEGATIVE Final    Comment: (NOTE) The Xpert Xpress SARS-CoV-2/FLU/RSV plus assay is intended as an aid in the diagnosis of influenza from Nasopharyngeal swab specimens and should not be used as a sole basis for treatment.  Nasal washings and aspirates are unacceptable for Xpert Xpress SARS-CoV-2/FLU/RSV testing.  Fact Sheet for Patients: EntrepreneurPulse.com.au  Fact Sheet for Healthcare Providers: IncredibleEmployment.be  This test is not yet approved or cleared by the [place] FDA and has been authorized for detection and/or diagnosis of SARS-CoV-2 by FDA under an Emergency Use Authorization (EUA). This EUA will remain in effect (meaning this test can be used) for the duration of the COVID-19 declaration under Section 564(b)(1) of the Act, 21 U.S.C. section  360bbb-3(b)(1), unless the authorization is terminated or revoked.  Performed at [facility], [place] [address] [place], [place] [address]   Urine culture     Status: None   Collection Time: 03/21/21 10:43 PM   Specimen: Urine, Random  Result Value Ref Range Status   Specimen Description URINE, RANDOM  Final   Special Requests NONE  Final   Culture   Final    NO GROWTH Performed at [facility], [place] [address], [place], [place] [address]    Report Status 03/23/2021 FINAL  Final        Radiology Studies: US PELVIS LIMITED (TRANSABDOMINAL ONLY)  Result Date: 03/23/2021 CLINICAL DATA:  Benign prostatic hypertrophy EXAM: LIMITED ULTRASOUND OF PELVIS TECHNIQUE: Limited transabdominal ultrasound examination of the pelvis was performed. COMPARISON:  None. FINDINGS: The central prostatic gland is enlarged intrudes into the bladder lumen. The bladder is thick walled in keeping with changes of chronic bladder outlet obstruction. There is debris noted within the bladder lumen. Foley catheter balloon is seen within the bladder lumen. Volumetric measurements of the prostate gland are significantly limited by transabdominal technique and obscuration of much of the prostate gland by overlying osseous and enteric structures. The estimated dimensions of the prostate gland on this examination are 6.5 x 3.4 x 5.0 cm resulting in an estimated prostatic volume of 57 cc. However, in review of the prior CT examination of 11/11/2017, the prostate gland is seen measuring at least 5.2 x 5.5 x 7.7 cm resulting in a bullet volume of 144 cc. IMPRESSION: Technically limited examination. See above. Prostate volume on this examination is calculated at 57 cc but is likely inaccurate. Transrectal sonography or MRI examination is recommended for more definitive evaluation. Marked central prostatic hypertrophy with bladder wall thickening in keeping with changes of bladder outlet obstruction. Debris within the  bladder lumen. Electronically Signed   By: Fidela Salisbury MD   On: 03/23/2021 23:50   ECHOCARDIOGRAM COMPLETE  Result Date: 03/24/2021    ECHOCARDIOGRAM REPORT   Patient Name:   ABRAHAN FULMORE Date of Exam: 03/24/2021 Medical Rec #:  [id number]        Height:       63.0 in Accession #:    [id number]       Weight:       177.7 lb Date of Birth:  [birth year]         BSA:          1.839 m Patient Age:    36 years         BP:           119/76 mmHg Patient Gender: M                HR:           95 bpm. Exam Location:  Inpatient Procedure: 2D Echo Indications:    congestive heart failure  History:        Patient has prior history of Echocardiogram  examinations, most                 recent 07/01/2014. Risk Factors:Hypertension and Dyslipidemia.  Sonographer:    Johny Chess Referring Phys: [place]  1. Left ventricular ejection fraction, by estimation, is 25 to 30%. Left ventricular ejection fraction by 2D MOD biplane is 26.6 %. The left ventricle has severely decreased function. The left ventricle demonstrates global hypokinesis. Indeterminate diastolic filling due to E-A fusion.  2. Right ventricular systolic function is mildly reduced. The right ventricular size is normal. Tricuspid regurgitation signal is inadequate for assessing PA pressure.  3. The mitral valve is grossly normal. Trivial mitral valve regurgitation. No evidence of mitral stenosis.  4. The aortic valve is tricuspid. Aortic valve regurgitation is not visualized. No aortic stenosis is present.  5. There is mild dilatation of the ascending aorta, measuring 42 mm.  6. The inferior vena cava is normal in size with greater than 50% respiratory variability, suggesting right atrial pressure of 3 mmHg. FINDINGS  Left Ventricle: Left ventricular ejection fraction, by estimation, is 25 to 30%. Left ventricular ejection fraction by 2D MOD biplane is 26.6 %. The left ventricle has severely decreased function. The left ventricle  demonstrates global hypokinesis. The left ventricular internal cavity size was normal in size. There is no left ventricular hypertrophy. Indeterminate diastolic filling due to E-A fusion. Right Ventricle: The right ventricular size is normal. No increase in right ventricular wall thickness. Right ventricular systolic function is mildly reduced. Tricuspid regurgitation signal is inadequate for assessing PA pressure. Left Atrium: Left atrial size was normal in size. Right Atrium: Right atrial size was normal in size. Pericardium: Trivial pericardial effusion is present. Presence of pericardial fat pad. Mitral Valve: The mitral valve is grossly normal. Trivial mitral valve regurgitation. No evidence of mitral valve stenosis. Tricuspid Valve: The tricuspid valve is grossly normal. Tricuspid valve regurgitation is not demonstrated. No evidence of tricuspid stenosis. Aortic Valve: The aortic valve is tricuspid. Aortic valve regurgitation is not visualized. No aortic stenosis is present. Aortic valve mean gradient measures 1.9 mmHg. Aortic valve peak gradient measures 2.8 mmHg. Aortic valve area, by VTI measures 2.62 cm. Pulmonic Valve: The pulmonic valve was grossly normal. Pulmonic valve regurgitation is not visualized. No evidence of pulmonic stenosis. Aorta: The aortic root is normal in size and structure. There is mild dilatation of the ascending aorta, measuring 42 mm. Venous: The right upper pulmonary vein is normal. The inferior vena cava is normal in size with greater than 50% respiratory variability, suggesting right atrial pressure of 3 mmHg. IAS/Shunts: The atrial septum is grossly normal.  LEFT VENTRICLE PLAX 2D                        Biplane EF (MOD) LVIDd:         5.20 cm         LV Biplane EF:   Left LVIDs:         4.30 cm                          ventricular LV PW:         1.20 cm                          ejection LV IVS:        1.00 cm  fraction by LVOT diam:     2.40 cm                           2D MOD LV SV:         37                               biplane is LV SV Index:   20                               26.6 %. LVOT Area:     4.52 cm  LV Volumes (MOD) LV vol d, MOD    132.0 ml A2C: LV vol d, MOD    104.0 ml A4C: LV vol s, MOD    87.3 ml A2C: LV vol s, MOD    84.0 ml A4C: LV SV MOD A2C:   44.7 ml LV SV MOD A4C:   104.0 ml LV SV MOD BP:    31.7 ml RIGHT VENTRICLE            IVC RV S prime:     9.57 cm/s  IVC diam: 1.40 cm TAPSE (M-mode): 1.8 cm LEFT ATRIUM             Index       RIGHT ATRIUM           Index LA diam:        3.40 cm 1.85 cm/m  RA Area:     13.00 cm LA Vol (A2C):   54.6 ml 29.69 ml/m RA Volume:   26.00 ml  14.14 ml/m LA Vol (A4C):   41.9 ml 22.79 ml/m LA Biplane Vol: 47.8 ml 25.99 ml/m  AORTIC VALVE AV Area (Vmax):    3.09 cm AV Area (Vmean):   2.66 cm AV Area (VTI):     2.62 cm AV Vmax:           84.16 cm/s AV Vmean:          67.339 cm/s AV VTI:            0.140 m AV Peak Grad:      2.8 mmHg AV Mean Grad:      1.9 mmHg LVOT Vmax:         57.43 cm/s LVOT Vmean:        39.632 cm/s LVOT VTI:          0.081 m LVOT/AV VTI ratio: 0.58  AORTA Ao Root diam: 3.30 cm Ao Asc diam:  4.20 cm  SHUNTS Systemic VTI:  0.08 m Systemic Diam: 2.40 cm Eleonore Chiquito MD Electronically signed by Eleonore Chiquito MD Signature Date/Time: 03/24/2021/3:54:08 PM    Final         Scheduled Meds: . atorvastatin  20 mg Oral Daily  . carvedilol  3.125 mg Oral BID WC  . Chlorhexidine Gluconate Cloth  6 each Topical Daily  . dapagliflozin propanediol  5 mg Oral Daily  . feeding supplement  237 mL Oral BID BM  . finasteride  5 mg Oral Daily  . insulin aspart  0-6 Units Subcutaneous TID WC  . levothyroxine  100 mcg Oral QAC breakfast  . multivitamin with minerals  1 tablet Oral Daily  . polyethylene glycol  17 g Oral BID  . sodium chloride flush  10-40 mL Intracatheter Q12H  .  tamsulosin  0.4 mg Oral BID   Continuous Infusions: . piperacillin-tazobactam (ZOSYN)  IV 3.375 g (03/24/21  0912)     LOS: 2 days     Cordelia Poche, MD [facility] 03/24/2021, 4:51 PM  If 7PM-7AM, please contact night-coverage [email or web address]

## 2021-03-24 NOTE — Progress Notes (Signed)
Pharmacy Antibiotic Note  Brad Harrell is a 75 y.o. male admitted on 03/21/2021 with L-sided scrotal abscess.  Pharmacy has been consulted for Zosyn dosing.  WBC wnl. Afeb. Est CrCl ~50 ml/min. Planning OR 5/27.   Plan: Zosyn 3.375gm IV q8h F/u plan post- op, narrow antibiotics as able    Height 5'3" Weight: 80.6 kg (177 lb 11.1 oz)  Temp (24hrs), Avg:98.3 F (36.8 C), Min:98.1 F (36.7 C), Max:98.5 F (36.9 C)  Recent Labs  Lab 03/21/21 2230 03/21/21 2251 03/22/21 0435 03/23/21 0258 03/24/21 0222  WBC 15.7*  --  9.9 10.6* 7.7  CREATININE 1.70* 1.40* 1.48* 1.11 1.18  LATICACIDVEN 1.7  --   --   --   --     CrCl cannot be calculated (Unknown ideal weight.).    Allergies  Allergen Reactions  . Rocephin [Ceftriaxone] Nausea And Vomiting    Antimicrobials this admission: Zosyn 5/23  >>  PTA Cipro 5/20>>5/23    Microbiology results: 5/23 UCx:  ngtd  Thank you for allowing pharmacy to be a part of this patient's care.  Benetta Spar, PharmD, BCPS, BCCP Clinical Pharmacist  Please check AMION for all [place] phone numbers After 10:00 PM, call [place] [phone]

## 2021-03-25 ENCOUNTER — Inpatient Hospital Stay (HOSPITAL_COMMUNITY): Payer: Medicare Other | Admitting: Certified Registered Nurse Anesthetist

## 2021-03-25 ENCOUNTER — Encounter (HOSPITAL_COMMUNITY): Payer: Self-pay | Admitting: Family Medicine

## 2021-03-25 ENCOUNTER — Encounter (HOSPITAL_COMMUNITY)
Admission: EM | Disposition: A | Discharge status: Skilled Nursing Facility | Payer: Self-pay | Source: Home / Self Care | Attending: Internal Medicine

## 2021-03-25 DIAGNOSIS — N492 Inflammatory disorders of scrotum: Secondary | ICD-10-CM | POA: Diagnosis not present

## 2021-03-25 HISTORY — PX: SCROTAL EXPLORATION: SHX2386

## 2021-03-25 HISTORY — PX: TRANSURETHRAL RESECTION OF PROSTATE: SHX73

## 2021-03-25 LAB — CBC
HCT: 33 % — ABNORMAL LOW (ref 39.0–52.0)
HCT: 34.5 % — ABNORMAL LOW (ref 39.0–52.0)
Hemoglobin: 10 g/dL — ABNORMAL LOW (ref 13.0–17.0)
Hemoglobin: 10.3 g/dL — ABNORMAL LOW (ref 13.0–17.0)
MCH: 24.8 pg — ABNORMAL LOW (ref 26.0–34.0)
MCH: 25.1 pg — ABNORMAL LOW (ref 26.0–34.0)
MCHC: 30.3 g/dL (ref 30.0–36.0)
MCHC: 29.9 g/dL — ABNORMAL LOW (ref 30.0–36.0)
MCV: 81.7 fL (ref 80.0–100.0)
MCV: 83.9 fL (ref 80.0–100.0)
Platelets: 347 10*3/uL (ref 150–400)
Platelets: 296 10*3/uL (ref 150–400)
RBC: 4.04 MIL/uL — ABNORMAL LOW (ref 4.22–5.81)
RBC: 4.11 MIL/uL — ABNORMAL LOW (ref 4.22–5.81)
RDW: 17.5 % — ABNORMAL HIGH (ref 11.5–15.5)
RDW: 17.6 % — ABNORMAL HIGH (ref 11.5–15.5)
WBC: 5.3 10*3/uL (ref 4.0–10.5)
WBC: 8.8 10*3/uL (ref 4.0–10.5)
nRBC: 0 % (ref 0.0–0.2)
nRBC: 0 % (ref 0.0–0.2)

## 2021-03-25 LAB — BASIC METABOLIC PANEL
Anion gap: 6 (ref 5–15)
BUN: 21 mg/dL (ref 8–23)
CO2: 27 mmol/L (ref 22–32)
Calcium: 9.6 mg/dL (ref 8.9–10.3)
Chloride: 103 mmol/L (ref 98–111)
Creatinine, Ser: 1.06 mg/dL (ref 0.61–1.24)
GFR, Estimated: >60 mL/min (ref >60)
Glucose, Bld: 123 mg/dL — ABNORMAL HIGH (ref 70–99)
Potassium: 4.4 mmol/L (ref 3.5–5.1)
Sodium: 136 mmol/L (ref 135–145)

## 2021-03-25 LAB — GLUCOSE, CAPILLARY
Glucose-Capillary: 120 mg/dL — ABNORMAL HIGH (ref 70–99)
Glucose-Capillary: 175 mg/dL — ABNORMAL HIGH (ref 70–99)
Glucose-Capillary: 156 mg/dL — ABNORMAL HIGH (ref 70–99)
Glucose-Capillary: 162 mg/dL — ABNORMAL HIGH (ref 70–99)

## 2021-03-25 LAB — MAGNESIUM: Magnesium: 2 mg/dL (ref 1.7–2.4)

## 2021-03-25 LAB — PHOSPHORUS: Phosphorus: 3.3 mg/dL (ref 2.5–4.6)

## 2021-03-25 SURGERY — EXPLORATION, SCROTUM
Anesthesia: General

## 2021-03-25 MED ORDER — 0.9 % SODIUM CHLORIDE (POUR BTL) OPTIME
TOPICAL | Status: DC | PRN
  Administered 2021-03-25: 1000 mL

## 2021-03-25 MED ORDER — SODIUM CHLORIDE 0.9 % IR SOLN
3000.0000 mL | Status: DC
Start: 2021-03-25 — End: 2021-03-30

## 2021-03-25 MED ORDER — ONDANSETRON HCL 4 MG/2ML IJ SOLN
INTRAMUSCULAR | Status: DC | PRN
  Administered 2021-03-25: 4 mg via INTRAVENOUS

## 2021-03-25 MED ORDER — LIDOCAINE 2% (20 MG/ML) 5 ML SYRINGE
INTRAMUSCULAR | Status: AC
  Filled 2021-03-25: qty 5

## 2021-03-25 MED ORDER — PROPOFOL 10 MG/ML IV BOLUS
INTRAVENOUS | Status: DC | PRN
  Administered 2021-03-25: 100 mg via INTRAVENOUS

## 2021-03-25 MED ORDER — SODIUM CHLORIDE 0.9 % IR SOLN
Status: DC | PRN
Start: 2021-03-25 — End: 2021-03-25
  Administered 2021-03-25: 24000 mL

## 2021-03-25 MED ORDER — AMISULPRIDE (ANTIEMETIC) 5 MG/2ML IV SOLN: 10.0000 mg | Freq: Once | INTRAVENOUS | Status: DC | PRN

## 2021-03-25 MED ORDER — FENTANYL CITRATE (PF) 100 MCG/2ML IJ SOLN
INTRAMUSCULAR | Status: AC
  Filled 2021-03-25: qty 2

## 2021-03-25 MED ORDER — ONDANSETRON HCL 4 MG/2ML IJ SOLN
INTRAMUSCULAR | Status: AC
  Filled 2021-03-25: qty 2

## 2021-03-25 MED ORDER — PROPOFOL 10 MG/ML IV BOLUS
INTRAVENOUS | Status: AC
  Filled 2021-03-25: qty 20

## 2021-03-25 MED ORDER — OXYCODONE HCL 5 MG PO TABS
5.0000 mg | ORAL_TABLET | Freq: Four times a day (QID) | ORAL | Status: DC | PRN
  Administered 2021-03-25 – 2021-03-30 (×8): 5 mg via ORAL
  Filled 2021-03-25 (×8): qty 1

## 2021-03-25 MED ORDER — STERILE WATER FOR IRRIGATION IR SOLN
Status: DC | PRN
  Administered 2021-03-25: 500 mL

## 2021-03-25 MED ORDER — FENTANYL CITRATE (PF) 100 MCG/2ML IJ SOLN
INTRAMUSCULAR | Status: DC | PRN
  Administered 2021-03-25 (×2): 25 ug via INTRAVENOUS
  Administered 2021-03-25 (×5): 50 ug via INTRAVENOUS

## 2021-03-25 MED ORDER — PHENYLEPHRINE 40 MCG/ML (10ML) SYRINGE FOR IV PUSH (FOR BLOOD PRESSURE SUPPORT)
PREFILLED_SYRINGE | INTRAVENOUS | Status: DC | PRN
  Administered 2021-03-25: 120 ug via INTRAVENOUS
  Administered 2021-03-25: 80 ug via INTRAVENOUS

## 2021-03-25 MED ORDER — ROCURONIUM BROMIDE 10 MG/ML (PF) SYRINGE
PREFILLED_SYRINGE | INTRAVENOUS | Status: DC | PRN
  Administered 2021-03-25: 40 mg via INTRAVENOUS

## 2021-03-25 MED ORDER — LIDOCAINE 2% (20 MG/ML) 5 ML SYRINGE
INTRAMUSCULAR | Status: DC | PRN
  Administered 2021-03-25: 50 mg via INTRAVENOUS

## 2021-03-25 MED ORDER — ROCURONIUM BROMIDE 10 MG/ML (PF) SYRINGE
PREFILLED_SYRINGE | INTRAVENOUS | Status: AC
  Filled 2021-03-25: qty 10

## 2021-03-25 MED ORDER — LACTATED RINGERS IV SOLN: INTRAVENOUS | Status: DC

## 2021-03-25 MED ORDER — CHLORHEXIDINE GLUCONATE 0.12 % MT SOLN
15.0000 mL | OROMUCOSAL | Status: AC
  Administered 2021-03-25: 15 mL via OROMUCOSAL

## 2021-03-25 MED ORDER — SUGAMMADEX SODIUM 200 MG/2ML IV SOLN
INTRAVENOUS | Status: DC | PRN
  Administered 2021-03-25: 200 mg via INTRAVENOUS

## 2021-03-25 MED ORDER — FENTANYL CITRATE (PF) 100 MCG/2ML IJ SOLN
25.0000 ug | INTRAMUSCULAR | Status: DC | PRN
  Administered 2021-03-25: 50 ug via INTRAVENOUS

## 2021-03-25 SURGICAL SUPPLY — 42 items
APL SKNCLS STERI-STRIP NONHPOA (GAUZE/BANDAGES/DRESSINGS)
BAG DRN RND TRDRP ANRFLXCHMBR (UROLOGICAL SUPPLIES)
BAG URINE DRAIN 2000ML AR STRL (UROLOGICAL SUPPLIES) IMPLANT
BAG URO CATCHER STRL LF (MISCELLANEOUS) ×3 IMPLANT
BENZOIN TINCTURE PRP APPL 2/3 (GAUZE/BANDAGES/DRESSINGS) ×2 IMPLANT
BNDG GAUZE ELAST 4 BULKY (GAUZE/BANDAGES/DRESSINGS) ×3 IMPLANT
CATH FOLEY 3WAY 30CC 22FR (CATHETERS) ×1 IMPLANT
COVER WAND RF STERILE (DRAPES) IMPLANT
DECANTER SPIKE VIAL GLASS SM (MISCELLANEOUS) ×2 IMPLANT
DRAIN PENROSE 0.25X18 (DRAIN) ×1 IMPLANT
DRAIN PENROSE 0.5X18 (DRAIN) IMPLANT
DRAPE FOOT SWITCH (DRAPES) ×3 IMPLANT
DRAPE LAPAROTOMY T 98X78 PEDS (DRAPES) ×2 IMPLANT
ELECT REM PT RETURN 15FT ADLT (MISCELLANEOUS) ×3 IMPLANT
GAUZE SPONGE 4X4 12PLY STRL (GAUZE/BANDAGES/DRESSINGS) ×3 IMPLANT
GLOVE SURG POLYISO LF SZ8 (GLOVE) ×3 IMPLANT
GOWN STRL REUS W/TWL XL LVL3 (GOWN DISPOSABLE) ×3 IMPLANT
HOLDER FOLEY CATH W/STRAP (MISCELLANEOUS) ×1 IMPLANT
KIT BASIN OR (CUSTOM PROCEDURE TRAY) ×3 IMPLANT
KIT TURNOVER KIT A (KITS) ×3 IMPLANT
LOOP CUT BIPOLAR 24F LRG (ELECTROSURGICAL) ×2 IMPLANT
MANIFOLD NEPTUNE II (INSTRUMENTS) ×3 IMPLANT
NEEDLE HYPO 22GX1.5 SAFETY (NEEDLE) ×2 IMPLANT
NS IRRIG 1000ML POUR BTL (IV SOLUTION) IMPLANT
PACK CYSTO (CUSTOM PROCEDURE TRAY) ×3 IMPLANT
PACK GENERAL/GYN (CUSTOM PROCEDURE TRAY) ×2 IMPLANT
SUPPORT SCROTAL LG STRP (MISCELLANEOUS) ×3 IMPLANT
SUT CHROMIC 3 0 SH 27 (SUTURE) ×6 IMPLANT
SUT CHROMIC 4 0 SH 27 (SUTURE) IMPLANT
SUT VIC AB 0 CT1 36 (SUTURE) ×1 IMPLANT
SUT VIC AB 3-0 SH 27 (SUTURE)
SUT VIC AB 3-0 SH 27XBRD (SUTURE) ×2 IMPLANT
SUT VICRYL 0 TIES 12 18 (SUTURE) ×3 IMPLANT
SWAB COLLECTION DEVICE MRSA (MISCELLANEOUS) ×1 IMPLANT
SWAB CULTURE ESWAB REG 1ML (MISCELLANEOUS) ×1 IMPLANT
SYR 30ML LL (SYRINGE) ×1 IMPLANT
SYR CONTROL 10ML LL (SYRINGE) ×2 IMPLANT
SYR TOOMEY IRRIG 70ML (MISCELLANEOUS) ×3
SYRINGE TOOMEY IRRIG 70ML (MISCELLANEOUS) IMPLANT
TUBING CONNECTING 10 (TUBING) ×3 IMPLANT
TUBING UROLOGY SET (TUBING) ×3 IMPLANT
WATER STERILE IRR 1000ML POUR (IV SOLUTION) IMPLANT

## 2021-03-25 NOTE — Transfer of Care (Signed)
Immediate Anesthesia Transfer of Care Note  Patient: Brad Harrell  Procedure(s) Performed: LEFT SCROTUM EXPLORATION WITH ORCHIECTOMY (Left ) CYSTOSCOPY TRANSURETHRAL RESECTION OF THE PROSTATE (TURP) (N/A )  Patient Location: PACU  Anesthesia Type:General  Level of Consciousness: awake and alert   Airway & Oxygen Therapy: Patient Spontanous Breathing and Patient connected to face mask oxygen  Post-op Assessment: Report given to RN and Post -op Vital signs reviewed and stable  Post vital signs: Reviewed and stable  Last Vitals:  Vitals Value Taken Time  BP 117/78 03/25/21 1116  Temp    Pulse 103 03/25/21 1118  Resp 10 03/25/21 1118  SpO2 100 % 03/25/21 1118  Vitals shown include unvalidated device data.  Last Pain:  Vitals:   03/25/21 0820  TempSrc: Oral  PainSc:       Patients Stated Pain Goal: 0 (88/50/27 7412)  Complications: No complications documented.

## 2021-03-25 NOTE — Anesthesia Procedure Notes (Signed)
Procedure Name: Intubation Date/Time: 03/25/2021 9:20 AM Performed by: [place] Jozette Castrellon C, CRNA Pre-anesthesia Checklist: Patient identified, Emergency Drugs available, Suction available and Patient being monitored Patient Re-evaluated:Patient Re-evaluated prior to induction Oxygen Delivery Method: Circle system utilized Preoxygenation: Pre-oxygenation with 100% oxygen Induction Type: IV induction Ventilation: Mask ventilation without difficulty Laryngoscope Size: Mac and 4 Grade View: Grade I Tube type: Oral Tube size: 7.5 mm Number of attempts: 1 Airway Equipment and Method: Stylet and Oral airway Placement Confirmation: ETT inserted through vocal cords under direct vision,  positive ETCO2 and breath sounds checked- equal and bilateral Tube secured with: Tape Dental Injury: Teeth and Oropharynx as per pre-operative assessment

## 2021-03-25 NOTE — Progress Notes (Signed)
PROGRESS NOTE  Brad Harrell BJS:[id number] DOB: 12-27-1945 DOA: 03/21/2021 PCP: Nolene Ebbs, MD  HPI/Recap of past 24 hours:  Brad Harrell is a 75 y.o. male with a history of diabetes mellitus type 2, hypertension, hypothyroidism, BPH, chronic combined systolic and diastolic heart failure.  Patient presented secondary to left swelling with concern for scrotal abscess on initial imaging.  Urology was consulted on admission.  Urine cx sent due to pyuria.  Empiric IV antibiotics initiated.  Transferred to [facility] from [facility] on 03/04/21, [facility] to [facility].  POD# 0 post left scrotal exploration with left orchiectomy and drainage of pyocele, transurethral resection of prostate.  Discussed with Dr. Jeffie Pollock, urology, the left testicle was surrounded by pus and had to be removed.  He has Foley on irrigation following the TURP.  Has a wound drain from the scrotum that will need to stay a few days.  We plan to keep the Foley until Monday and consider drain removal then as well.  Continue empiric IV antibiotics until cultures result.  03/25/21: Patient was seen and examined at his bedside.  He reports 8/10 pain in his scrotum.  Pain management in place.   Assessment/Plan: Principal Problem:   Scrotal abscess Active Problems:   Urinary retention   History of ischemic stroke   Renal insufficiency   Hyperkalemia   Chronic combined systolic and diastolic CHF (congestive heart failure) (HCC)   Hypothyroidism   Malnutrition of moderate degree  Left epididymo-orchitis with pyocele, POA, post left scrotal exploration with left orchiectomy and drainage of pyocele and transurethral resection of prostate on 03/25/2021. Continue IV Zosyn Appreciate urology's assistance. Discussed with Dr. Jeffie Pollock, urology, the left testicle was surrounded by pus and had to be removed.  He has Foley on irrigation following the TURP.  Has a wound drain from the scrotum that will need to stay a few days.  We plan to keep the Foley until  Monday and consider drain removal then as well.  Continue empiric IV antibiotics until cultures result.  BPH with urinary retention Follows with urology outpatient. Foley placed on 03/22/21. Post TURP on 03/25/2021 On home finasteride, Cardura, and Flomax.  Chronic combined systolic and diastolic heart failure Last Transthoracic Echocardiogram from 2015. Not on heart failure medications as an outpatient. Currently euvolemic. Repeat Transthoracic Echocardiogram shows a worsened EF of 25-30% with global hypokinesis -Cardiology consult for recommendations -Continue Coreg -Will likely require ACEi/ARB  Resolved hyperkalemia posttreatment with Lokelma. Potassium 6.2 admission likely secondary to AKI.   Serum potassium 4.4.  Resolved AKI likely prerenal and postrenal Presented with creatinine of 1.7.  He received IV fluid hydration. Creatinine 1.0 with GFR greater than 60.  History of CVA Patient with resultant cognitive/memory impairment. Currently oriented mostly, however. Patient is on Lipitor as an outpatient.  Aspirin and Plavix held secondary to planned surgery -Continue Lipitor  Hypothyroidism -Continue home Synthroid  Diabetes mellitus, type 2 Patient is on Farxiga and metformin as an outpatient. Hemoglobin A1C 5.3% on this admission. Very well controlled. -Continue SSI -Hold off home hypoglycemics.   DVT prophylaxis: SCDs Code Status:   Full Code Family Communication: None at bedside Disposition Plan:  Discharge possibly to home on Monday when urology signs of.  Recommendations/management, transition to oral antibiotics if needed   Consultants:   Urology  Procedures:   None  Antimicrobials:  Zosyn IV      Status is: Inpatient   Dispo:  Patient From: Home  Planned Disposition: Home, possibly on Monday, 03/28/2021 or  when urology signs off.  Medically stable for discharge: No          Objective: Vitals:   03/25/21 1145 03/25/21  1200 03/25/21 1215 03/25/21 1249  BP: 123/86 121/77 125/79 120/78  Pulse: 98 98 100 99  Resp: 15 11 12 16   Temp:   98.4 F (36.9 C) 97.9 F (36.6 C)  TempSrc:    Oral  SpO2: 99% 97% 97% 100%  Weight:      Height:        Intake/Output Summary (Last 24 hours) at 03/25/2021 1532 Last data filed at 03/25/2021 1443 Gross per 24 hour  Intake 900 ml  Output 6900 ml  Net -6000 ml   Filed Weights   03/24/21 0524 03/25/21 0821  Weight: 80.6 kg 80.6 kg    Exam:  . General: 75 y.o. year-old male well developed well nourished in no acute distress.  Alert and oriented x3.  Foley catheter in place and draining light pink urine. . Cardiovascular: Regular rate and rhythm with no rubs or gallops.  No thyromegaly or JVD noted.   Marland Kitchen Respiratory: Clear to auscultation with no wheezes or rales. Good inspiratory effort. . Abdomen: Soft nontender nondistended with normal bowel sounds x4 quadrants. . Musculoskeletal: No lower extremity edema. 2/4 pulses in all 4 extremities. . Skin: No ulcerative lesions noted or rashes, . Psychiatry: Mood is appropriate for condition and setting   Data Reviewed: CBC: Recent Labs  Lab 03/21/21 2230 03/21/21 2251 03/22/21 0435 03/23/21 0258 03/24/21 0222 03/25/21 0655 03/25/21 1307  WBC 15.7*  --  9.9 10.6* 7.7 5.3 8.8  NEUTROABS 12.6*  --   --  7.2  --   --   --   HGB 10.0*   < > 8.9* 10.0* 10.4* 10.0* 10.3*  HCT 32.5*   < > 29.6* 32.0* 33.7* 33.0* 34.5*  MCV 80.8  --  82.0 79.2* 79.7* 81.7 83.9  PLT 344  --  398 428* 400 347 296   < > = values in this interval not displayed.   Basic Metabolic Panel: Recent Labs  Lab 03/21/21 2230 03/21/21 2251 03/22/21 0435 03/22/21 1111 03/23/21 0258 03/23/21 0848 03/23/21 1356 03/24/21 0222 03/25/21 0655  NA 135 136 138  --  137  --   --  138 136  K 6.2* 5.7* 4.4   < > 4.2 4.2 4.1 4.2 4.4  CL 99 103 104  --  103  --   --  104 103  CO2 21*  --  19*  --  21*  --   --  23 27  GLUCOSE 130* 127* 121*  --  96   --   --  156* 123*  BUN 33* 32* 31*  --  24*  --   --  22 21  CREATININE 1.70* 1.40* 1.48*  --  1.11  --   --  1.18 1.06  CALCIUM 9.8  --  9.3  --  9.6  --   --  9.6 9.6  MG  --   --   --   --  2.0  --   --   --  2.0  PHOS  --   --   --   --  3.7  --   --   --  3.3   < > = values in this interval not displayed.   GFR: Estimated Creatinine Clearance: 54.8 mL/min (by C-G formula based on SCr of 1.06 mg/dL). Liver Function Tests: Recent Labs  Lab 03/23/21 0258  AST 18  ALT 14  ALKPHOS 55  BILITOT 1.0  PROT 8.0  ALBUMIN 2.7*   No results for input(s): LIPASE, AMYLASE in the last 168 hours. No results for input(s): AMMONIA in the last 168 hours. Coagulation Profile: Recent Labs  Lab 03/21/21 2230  INR 1.1   Cardiac Enzymes: No results for input(s): CKTOTAL, CKMB, CKMBINDEX, TROPONINI in the last 168 hours. BNP (last 3 results) No results for input(s): PROBNP in the last 8760 hours. HbA1C: No results for input(s): HGBA1C in the last 72 hours. CBG: Recent Labs  Lab 03/24/21 1202 03/24/21 1810 03/24/21 2114 03/25/21 0740 03/25/21 1208  GLUCAP 152* 160* 149* 120* 175*   Lipid Profile: No results for input(s): CHOL, HDL, LDLCALC, TRIG, CHOLHDL, LDLDIRECT in the last 72 hours. Thyroid Function Tests: No results for input(s): TSH, T4TOTAL, FREET4, T3FREE, THYROIDAB in the last 72 hours. Anemia Panel: Recent Labs    03/23/21 0258  VITAMINB12 939*  FOLATE 11.6  FERRITIN 311  TIBC 231*  IRON 20*  RETICCTPCT 1.4   Urine analysis:    Component Value Date/Time   COLORURINE AMBER (A) 03/21/2021 2243   APPEARANCEUR CLOUDY (A) 03/21/2021 2243   LABSPEC 1.022 03/21/2021 2243   PHURINE 5.0 03/21/2021 2243   GLUCOSEU >=500 (A) 03/21/2021 2243   HGBUR MODERATE (A) 03/21/2021 2243   BILIRUBINUR NEGATIVE 03/21/2021 2243   KETONESUR 20 (A) 03/21/2021 2243   PROTEINUR >=300 (A) 03/21/2021 2243   UROBILINOGEN 1.0 06/30/2014 1213   NITRITE NEGATIVE 03/21/2021 2243    LEUKOCYTESUR LARGE (A) 03/21/2021 2243   Sepsis Labs: @LABRCNTIP (procalcitonin:4,lacticidven:4)  ) Recent Results (from the past 240 hour(s))  Resp Panel by RT-PCR (Flu A&B, Covid) Nasopharyngeal Swab     Status: None   Collection Time: 03/21/21  8:40 PM   Specimen: Nasopharyngeal Swab; Nasopharyngeal(NP) swabs in vial transport medium  Result Value Ref Range Status   SARS Coronavirus 2 by RT PCR NEGATIVE NEGATIVE Final    Comment: (NOTE) SARS-CoV-2 target nucleic acids are NOT DETECTED.  The SARS-CoV-2 RNA is generally detectable in upper respiratory specimens during the acute phase of infection. The lowest concentration of SARS-CoV-2 viral copies this assay can detect is 138 copies/mL. A negative result does not preclude SARS-Cov-2 infection and should not be used as the sole basis for treatment or other patient management decisions. A negative result may occur with  improper specimen collection/handling, submission of specimen other than nasopharyngeal swab, presence of viral mutation(s) within the areas targeted by this assay, and inadequate number of viral copies(<138 copies/mL). A negative result must be combined with clinical observations, patient history, and epidemiological information. The expected result is Negative.  Fact Sheet for Patients:  EntrepreneurPulse.com.au  Fact Sheet for Healthcare Providers:  IncredibleEmployment.be  This test is no t yet approved or cleared by the [place] FDA and  has been authorized for detection and/or diagnosis of SARS-CoV-2 by FDA under an Emergency Use Authorization (EUA). This EUA will remain  in effect (meaning this test can be used) for the duration of the COVID-19 declaration under Section 564(b)(1) of the Act, 21 U.S.C.section 360bbb-3(b)(1), unless the authorization is terminated  or revoked sooner.       Influenza A by PCR NEGATIVE NEGATIVE Final   Influenza B by PCR NEGATIVE  NEGATIVE Final    Comment: (NOTE) The Xpert Xpress SARS-CoV-2/FLU/RSV plus assay is intended as an aid in the diagnosis of influenza from Nasopharyngeal swab specimens and should not be used as  a sole basis for treatment. Nasal washings and aspirates are unacceptable for Xpert Xpress SARS-CoV-2/FLU/RSV testing.  Fact Sheet for Patients: EntrepreneurPulse.com.au  Fact Sheet for Healthcare Providers: IncredibleEmployment.be  This test is not yet approved or cleared by the [place] FDA and has been authorized for detection and/or diagnosis of SARS-CoV-2 by FDA under an Emergency Use Authorization (EUA). This EUA will remain in effect (meaning this test can be used) for the duration of the COVID-19 declaration under Section 564(b)(1) of the Act, 21 U.S.C. section 360bbb-3(b)(1), unless the authorization is terminated or revoked.  Performed at [facility], [place] [address], [place], [place] [address]   Urine culture     Status: None   Collection Time: 03/21/21 10:43 PM   Specimen: Urine, Random  Result Value Ref Range Status   Specimen Description URINE, RANDOM  Final   Special Requests NONE  Final   Culture   Final    NO GROWTH Performed at [facility], [place] [address], [place], [place] [address]    Report Status 03/23/2021 FINAL  Final  Anaerobic culture w Gram Stain     Status: None (Preliminary result)   Collection Time: 03/25/21  9:49 AM   Specimen: Abscess  Result Value Ref Range Status   Specimen Description   Final    ABSCESS SCROTAL Performed at [place] [address] [place], [place] [address]    Special Requests   Final    NONE Performed at [facility], [place] [address], [place], [place] [address]    Gram Stain   Final    ABUNDANT WBC PRESENT, PREDOMINANTLY PMN NO ORGANISMS SEEN Performed at [facility], [place] [address] [place], [place] [address]    Culture  PENDING  Incomplete   Report Status PENDING  Incomplete      Studies: No results found.  Scheduled Meds: . atorvastatin  20 mg Oral Daily  . carvedilol  3.125 mg Oral BID WC  . Chlorhexidine Gluconate Cloth  6 each Topical Daily  . feeding supplement  237 mL Oral BID BM  . fentaNYL      . finasteride  5 mg Oral Daily  . insulin aspart  0-6 Units Subcutaneous TID WC  . levothyroxine  100 mcg Oral QAC breakfast  . multivitamin with minerals  1 tablet Oral Daily  . polyethylene glycol  17 g Oral BID  . sodium chloride flush  10-40 mL Intracatheter Q12H  . tamsulosin  0.4 mg Oral BID    Continuous Infusions: . piperacillin-tazobactam (ZOSYN)  IV 3.375 g (03/25/21 0330)  . sodium chloride irrigation       LOS: 3 days     Kayleen Memos, MD [facility] Pager [phone]  If 7PM-7AM, please contact night-coverage [email or web address] Password [facility] 03/25/2021, 3:32 PM

## 2021-03-25 NOTE — Interval H&P Note (Signed)
History and Physical Interval Note: No change.  03/25/2021 8:49 AM  Brad Harrell  has presented today for surgery, with the diagnosis of LEFT EPIDIDYMO ORCHITIS, BENIGN PROSTATE HYPERPLASIA WITH RETENTION.  The various methods of treatment have been discussed with the patient and family. After consideration of risks, benefits and other options for treatment, the patient has consented to  Procedure(s): LEFT SCROTUM EXPLORATION WITH POSSIBLE ORCHIECTOMY (Left) CYSTOSCOPY TRANSURETHRAL RESECTION OF THE PROSTATE (TURP) (N/A) as a surgical intervention.  The patient's history has been reviewed, patient examined, no change in status, stable for surgery.  I have reviewed the patient's chart and labs.  Questions were answered to the patient's satisfaction.     [name]

## 2021-03-25 NOTE — Plan of Care (Signed)

## 2021-03-25 NOTE — Progress Notes (Signed)
PHARMACY NOTE -  [facility] has been assisting with dosing of Zosyn for scrotal abscess.  Dosage remains stable at 3.375 g IV q8 hr and further renal adjustments per institutional Pharmacy antibiotic protocol  Pharmacy will sign off, following peripherally for culture results, dose adjustments, and length of therapy. Please reconsult if a change in clinical status warrants re-evaluation of dosage.  Reuel Boom, PharmD, BCPS [phone] 03/25/2021, 1:04 PM

## 2021-03-25 NOTE — Progress Notes (Signed)
OT Cancellation Note  Patient Details Name: Brad Harrell MRN: [id number] DOB: [birth year]   Cancelled Treatment:    Reason Eval/Treat Not Completed: Medical issues which prohibited therapy. Patient down for surgery. Will f/u as able.  [name] L [name] 03/25/2021, 9:11 AM

## 2021-03-25 NOTE — Op Note (Signed)
Procedure: 1.  Left scrotal exploration with left orchiectomy and drainage of pyocele. 2.  Transurethral resection of prostate.  Preop diagnosis: 1.  Left epididymoorchitis with possible abscess. 2.  BPH with retention.  Postop diagnosis: 1.  Left epididymal orchitis with pyocele. 2.  BPH with retention.  Surgeon: Dr. [name].  Anesthesia: General.  Specimen: 1.  Prostate chips.  2.  Wound cultures from left scrotum. 3.  Left testicle.  Drains: 1.  Quarter-inch Penrose scrotal drain. 2.  22 French three-way Foley catheter.  EBL: 100 mL.  Complications: None.  Indications: The patient is a 75 year old male who presented with left scrotal swelling and pain and was found to have a probable pyocele on ultrasound versus possible abscess and epididymoorchitis.  He was placed on Zosyn but has had persistent swelling and tenderness and it was felt that scrotal exploration with probable orchiectomy was indicated once he had been off of Plavix for an appropriate time.  He also was noted to have urinary retention with residual 340 mL with a history of BPH with trilobar hyperplasia and a prior UroLift several years before.  He has been on tamsulosin and finasteride but has had increased voiding symptoms.  Procedure: He had been on Zosyn.  He had been off his Plavix since admission.  He was taken to the operating room where general anesthetic was induced.  He was placed in lithotomy position and fitted with PAS hose.  His scrotum was clipped.  The perineum and genitalia were prepped with Betadine solution and he was draped in usual sterile fashion.  A left anterior oblique scrotal incision was made with a knife and carried down through the dartos using the Bovie.  Upon entry into the tunica vaginalis there was E flux of pus under pressure.  Aerobic and anaerobic cultures were obtained.  The testicle was then bluntly dissected out of the tunica vaginalis and the cord was exposed and superior scrotum.   The cord was divided into 3 packets which were clamped with hemostats the cord was divided testicle was removed.  The packets were each doubly ligated with 0 Vicryl stick ties.  Once hemostasis was assured, 1/4 inch Penrose drain was placed in the scrotal cavity.  The scrotal wall was loosely closed using an interrupted vertical mattress 3-0 chromic suture and the drain was secured with a 3-0 chromic suture as well.  The 74 French continuous-flow resectoscope sheath was then inserted using the visual obturator.  The visual operator was removed and an Beatrix Fetters handle with a bipolar loop and 30 degree lens was placed.  Examination revealed a normal urethra.  The external sphincter was intact.  He had trilobar hyperplasia with approximately 5 cm prostatic urethra and a middle lobe that was tacked to the right.  Exposure left tab was visible on the right side of the middle lobe.  Examination of bladder revealed severe inflammatory inflammation from the Foley and moderate to severe trabeculation.  Ureteral orifice ease were not well seen because the middle lobe.  The middle lobe of the prostate was then resected and then this was followed by resection of the left lobe from the bladder neck to the apex resection was carried down to the floor and out to the verumontanum.  The right lobe was then resected in a similar fashion.  2 UroLift tabs were encountered during resection of the middle lobe and 2 additional UroLift tabs were encountered during resection of the right lobe.  These tabs were removed and the  connecting suture was trimmed back as far as possible.  I did not do an excessive anterior resection just because of the bulk of the tissue.  But once an adequate channel had been created, the bladder was evacuated free of chips and hemostasis was achieved.  Additional resection of the bladder neck tissue and apical tissue was performed as needed.  With chips removed and hemostasis achieved accordingly.  Final  inspection revealed a good channel but not complete resection which is not surprising considering size of the prostate.  There were a few sphincter fibers exposed on the right apex but it otherwise appeared intact.  The resectoscope was removed and pressure on the bladder produced an excellent stream.  A 22 French three-way Foley catheter was inserted with the aid of a catheter guide.  The balloon was filled with 30 mL of sterile fluid.  The catheter was held on traction and irrigated with clear return.  The catheter was then placed to straight drainage and continuous irrigation with saline.  Patient was taken down from lithotomy position, Foley was secured to his leg on traction, his anesthetic was reversed and he was taken to recovery room in stable condition.  There were no complications.

## 2021-03-25 NOTE — Discharge Instructions (Signed)
tu Transurethral Resection of the Prostate, Care After This sheet gives you information about how to care for yourself after your procedure. Your health care provider may also give you more specific instructions. If you have problems or questions, contact your health care provider. What can I expect after the procedure? After the procedure, it is common to have:  Mild pain in your lower abdomen.  Soreness or mild discomfort in your penis from having the catheter inserted during the procedure.  A feeling of urgency when you need to urinate.  A small amount of blood in your urine. You may notice some small blood clots in your urine. These are normal. Follow these instructions at home: Medicines  Take over-the-counter and prescription medicines only as told by your health care provider.  If you were prescribed an antibiotic medicine, take it as told by your health care provider. Do not stop taking the antibiotic even if you start to feel better.  Ask your health care provider if the medicine prescribed to you: ? Requires you to avoid driving or using heavy machinery. ? Can cause constipation. You may need to take actions to prevent or treat constipation, such as:  Take over-the-counter or prescription medicines.  Eat foods that are high in fiber, such as fresh fruits and vegetables, whole grains, and beans.  Limit foods that are high in fat and processed sugars, such as fried or sweet foods.  Do not drive for 24 hours if you were given a sedative during your procedure. Activity  Return to your normal activities as told by your health care provider. Ask your health care provider what activities are safe for you.  Do not lift anything that is heavier than 10 lb (4.5 kg), or the limit that you are told, for 3 weeks after the procedure or until your health care provider says that it is safe.  Avoid intense physical activity for as long as told by your health care provider.  Avoid  sitting for a long time without moving. Get up and move around one or more times every few hours. This helps to prevent blood clots. You may increase your physical activity gradually as you start to feel better.   Lifestyle  Do not drink alcohol for as long as told by your health care provider. This is especially important if you are taking prescription pain medicines.  Do not engage in sexual activity until your health care provider says that you can do this. General instructions  Do not take baths, swim, or use a hot tub until your health care provider approves.  Drink enough fluid to keep your urine pale yellow.  Urinate as soon as you feel the need to. Do not try to hold your urine for long periods of time.  If your health care provider approves, you may take a stool softener for 2-3 weeks to prevent you from straining to have a bowel movement.  Wear compression stockings as told by your health care provider. These stockings help to prevent blood clots and reduce swelling in your legs.  Keep all follow-up visits as told by your health care provider. This is important.   Contact a health care provider if you have:  Difficulty urinating.  A fever.  Pain that gets worse or does not improve with medicine.  Blood in your urine that does not go away after 1 week of resting and drinking more fluids.  Swelling in your penis or testicles. Get help right away if:  You are unable to urinate.  You are having more blood clots in your urine instead of fewer.  You have: ? Large blood clots. ? A lot of blood in your urine. ? Pain in your back or lower abdomen. ? Pain or swelling in your legs. ? Chills and you are shaking. ? Difficulty breathing or shortness of breath. Summary  After the procedure, it is common to have a small amount of blood in your urine.  Avoid heavy lifting and intense physical activity for as long as told by your health care provider.  Urinate as soon as you  feel the need to. Do not try to hold your urine for long periods of time.  Keep all follow-up visits as told by your health care provider. This is important. This information is not intended to replace advice given to you by your health care provider. Make sure you discuss any questions you have with your health care provider. Document Revised: 02/05/2019 Document Reviewed: 07/17/2018 Elsevier Patient Education  2021 [employer].

## 2021-03-25 NOTE — Progress Notes (Signed)
PT Cancellation Note  Patient Details Name: Brad Harrell MRN: [id number] DOB: January 11, 1946   Cancelled Treatment:    Reason Eval/Treat Not Completed: Patient not medically ready, possible surgery today.   Claretha Cooper 03/25/2021, 7:48 AM  [place] Pager [phone] Office [phone]

## 2021-03-25 NOTE — Anesthesia Postprocedure Evaluation (Signed)
Anesthesia Post Note  Patient: Brad Harrell  Procedure(s) Performed: LEFT SCROTUM EXPLORATION WITH ORCHIECTOMY (Left ) CYSTOSCOPY TRANSURETHRAL RESECTION OF THE PROSTATE (TURP) (N/A )     Patient location during evaluation: PACU Anesthesia Type: General Level of consciousness: awake and alert Pain management: pain level controlled Vital Signs Assessment: post-procedure vital signs reviewed and stable Respiratory status: spontaneous breathing, nonlabored ventilation, respiratory function stable and patient connected to nasal cannula oxygen Cardiovascular status: blood pressure returned to baseline and stable Postop Assessment: no apparent nausea or vomiting Anesthetic complications: no   No complications documented.  Last Vitals:  Vitals:   03/25/21 1249 03/25/21 1606  BP: 120/78 119/72  Pulse: 99 98  Resp: 16   Temp: 36.6 C   SpO2: 100%     Last Pain:  Vitals:   03/25/21 1606  TempSrc:   PainSc: 8                  Tiajuana Amass

## 2021-03-25 NOTE — Anesthesia Preprocedure Evaluation (Signed)
Anesthesia Evaluation  Patient identified by MRN, date of birth, ID band Patient awake    Reviewed: Allergy & Precautions, NPO status , Patient's Chart, lab work & pertinent test results  Airway Mallampati: II  TM Distance: >3 FB Neck ROM: Full    Dental  (+) Dental Advisory Given   Pulmonary former smoker,    breath sounds clear to auscultation       Cardiovascular hypertension, Pt. on medications and Pt. on home beta blockers +CHF   Rhythm:Regular Rate:Normal     Neuro/Psych CVA    GI/Hepatic negative GI ROS, (+)     substance abuse  ,   Endo/Other  diabetes, Type 2, Oral Hypoglycemic AgentsHypothyroidism   Renal/GU Renal InsufficiencyRenal disease     Musculoskeletal   Abdominal   Peds  Hematology  (+) anemia ,   Anesthesia Other Findings   Reproductive/Obstetrics                             Lab Results  Component Value Date   WBC 5.3 03/25/2021   HGB 10.0 (L) 03/25/2021   HCT 33.0 (L) 03/25/2021   MCV 81.7 03/25/2021   PLT 347 03/25/2021   Lab Results  Component Value Date   CREATININE 1.06 03/25/2021   BUN 21 03/25/2021   NA 136 03/25/2021   K 4.4 03/25/2021   CL 103 03/25/2021   CO2 27 03/25/2021    1. Left ventricular ejection fraction, by estimation, is 25 to 30%. Left ventricular ejection fraction by 2D MOD biplane is 26.6 %. The left ventricle has severely decreased function. The left ventricle demonstrates global hypokinesis. Indeterminate   diastolic filling due to E-A fusion.  2. Right ventricular systolic function is mildly reduced. The right ventricular size is normal. Tricuspid regurgitation signal is inadequate for assessing PA pressure.  3. The mitral valve is grossly normal. Trivial mitral valve regurgitation. No evidence of mitral stenosis.  4. The aortic valve is tricuspid. Aortic valve regurgitation is not visualized. No aortic stenosis is  present.  5. There is mild dilatation of the ascending aorta, measuring 42 mm.  6. The inferior vena cava is normal in size with greater than 50% respiratory variability, suggesting right atrial pressure of 3 mmHg.   Anesthesia Physical Anesthesia Plan  ASA: IV  Anesthesia Plan: General   Post-op Pain Management:    Induction: Intravenous  PONV Risk Score and Plan: 2 and Dexamethasone, Ondansetron and Treatment may vary due to age or medical condition  Airway Management Planned: Oral ETT  Additional Equipment:   Intra-op Plan:   Post-operative Plan: Extubation in OR  Informed Consent: I have reviewed the patients History and Physical, chart, labs and discussed the procedure including the risks, benefits and alternatives for the proposed anesthesia with the patient or authorized representative who has indicated his/her understanding and acceptance.     Dental advisory given  Plan Discussed with: CRNA  Anesthesia Plan Comments:         Anesthesia Quick Evaluation

## 2021-03-26 ENCOUNTER — Encounter (HOSPITAL_COMMUNITY): Payer: Self-pay | Admitting: Urology

## 2021-03-26 DIAGNOSIS — N492 Inflammatory disorders of scrotum: Secondary | ICD-10-CM | POA: Diagnosis not present

## 2021-03-26 LAB — BASIC METABOLIC PANEL
Anion gap: 9 (ref 5–15)
BUN: 21 mg/dL (ref 8–23)
CO2: 27 mmol/L (ref 22–32)
Calcium: 9.5 mg/dL (ref 8.9–10.3)
Chloride: 100 mmol/L (ref 98–111)
Creatinine, Ser: 1.05 mg/dL (ref 0.61–1.24)
GFR, Estimated: >60 mL/min (ref >60)
Glucose, Bld: 140 mg/dL — ABNORMAL HIGH (ref 70–99)
Potassium: 5.1 mmol/L (ref 3.5–5.1)
Sodium: 136 mmol/L (ref 135–145)

## 2021-03-26 LAB — CBC
HCT: 35.1 % — ABNORMAL LOW (ref 39.0–52.0)
Hemoglobin: 10.8 g/dL — ABNORMAL LOW (ref 13.0–17.0)
MCH: 24.8 pg — ABNORMAL LOW (ref 26.0–34.0)
MCHC: 30.8 g/dL (ref 30.0–36.0)
MCV: 80.7 fL (ref 80.0–100.0)
Platelets: 329 10*3/uL (ref 150–400)
RBC: 4.35 MIL/uL (ref 4.22–5.81)
RDW: 17.7 % — ABNORMAL HIGH (ref 11.5–15.5)
WBC: 11.3 10*3/uL — ABNORMAL HIGH (ref 4.0–10.5)
nRBC: 0 % (ref 0.0–0.2)

## 2021-03-26 LAB — GLUCOSE, CAPILLARY
Glucose-Capillary: 136 mg/dL — ABNORMAL HIGH (ref 70–99)
Glucose-Capillary: 259 mg/dL — ABNORMAL HIGH (ref 70–99)
Glucose-Capillary: 131 mg/dL — ABNORMAL HIGH (ref 70–99)
Glucose-Capillary: 246 mg/dL — ABNORMAL HIGH (ref 70–99)

## 2021-03-26 LAB — MRSA PCR SCREENING: MRSA by PCR: NEGATIVE

## 2021-03-26 LAB — PHOSPHORUS: Phosphorus: 3.4 mg/dL (ref 2.5–4.6)

## 2021-03-26 LAB — MAGNESIUM: Magnesium: 2 mg/dL (ref 1.7–2.4)

## 2021-03-26 MED ORDER — TRAZODONE HCL 50 MG PO TABS: 50.0000 mg | ORAL_TABLET | Freq: Every evening | ORAL | Status: DC | PRN

## 2021-03-26 MED ORDER — ENOXAPARIN SODIUM 40 MG/0.4ML IJ SOSY
40.0000 mg | PREFILLED_SYRINGE | INTRAMUSCULAR | Status: DC
  Administered 2021-03-26 – 2021-03-29 (×4): 40 mg via SUBCUTANEOUS
  Filled 2021-03-26 (×4): qty 0.4

## 2021-03-26 MED ORDER — ASPIRIN EC 81 MG PO TBEC
81.0000 mg | DELAYED_RELEASE_TABLET | Freq: Every day | ORAL | Status: DC
  Administered 2021-03-26 – 2021-03-30 (×5): 81 mg via ORAL
  Filled 2021-03-26 (×5): qty 1

## 2021-03-26 NOTE — Evaluation (Signed)
Occupational Therapy Evaluation Patient Details Name: Brad Harrell MRN: [id number] DOB: 1945/11/17 Today's Date: 03/26/2021    History of Present Illness Pt is a 75 year old man admitted on 03/22/21 with scrotal abscess. Underwent orchiectomy and TURP on 03/25/21. Hospital course complicated by  AKI. PMH: DM2, HTN, hypothyroidism, BPH, CHF, CVA with residual R side weakness.   Clinical Impression   Pt reports living with his daughter, using AD to ambulate and performing his own ADL modified independently. Pt with reported 8/10 pain, but still willing to get OOB and attempt sitting in chair. Pt requires moderate assistance for mobility with second person for safety and lines. He needs set up to total assist for ADL. Recommending ST rehab in SNF prior to return home. Will follow acutely.     Follow Up Recommendations  SNF    Equipment Recommendations  None recommended by OT    Recommendations for Other Services       Precautions / Restrictions Precautions Precautions: Fall      Mobility Bed Mobility Overal bed mobility: Needs Assistance Bed Mobility: Rolling;Sidelying to Sit Rolling: Mod assist Sidelying to sit: Mod assist       General bed mobility comments: verbal cues for technique    Transfers Overall transfer level: Needs assistance Equipment used: Rolling walker (2 wheeled) Transfers: Sit to/from [facility] Sit to Stand: +2 physical assistance;Mod assist Stand pivot transfers: +2 safety/equipment;Mod assist       General transfer comment: assist to rise and steady    Balance Overall balance assessment: Needs assistance   Sitting balance-Leahy Scale: Fair       Standing balance-Leahy Scale: Poor Standing balance comment: reliant on B UEs on walker and moderate assistance                           ADL either performed or assessed with clinical judgement   ADL Overall ADL's : Needs assistance/impaired Eating/Feeding: Set  up;Sitting   Grooming: Wash/dry hands;Wash/dry face;Sitting;Set up   Upper Body Bathing: Minimal assistance;Sitting   Lower Body Bathing: Maximal assistance;Sit to/from stand   Upper Body Dressing : Minimal assistance;Sitting   Lower Body Dressing: Total assistance;Bed level Lower Body Dressing Details (indicate cue type and reason): to don mesh underwear Toilet Transfer: Moderate assistance;+2 for safety/equipment;Stand-pivot;RW Toilet Transfer Details (indicate cue type and reason): simulate to chair                 Vision Baseline Vision/History: 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) Patient Visual Report: No change from baseline       Perception     Praxis      Pertinent Vitals/Pain Pain Assessment: 0-10 Pain Score: 8  Pain Location: scrotum Pain Descriptors / Indicators: Grimacing;Guarding;Discomfort Pain Intervention(s): Repositioned;Monitored during session     Hand Dominance Right   Extremity/Trunk Assessment Upper Extremity Assessment Upper Extremity Assessment: Overall WFL for tasks assessed   Lower Extremity Assessment Lower Extremity Assessment: Defer to PT evaluation       Communication Communication Communication: No difficulties   Cognition Arousal/Alertness: Awake/alert Behavior During Therapy: WFL for tasks assessed/performed Overall Cognitive Status: Within Functional Limits for tasks assessed                                     General Comments       Exercises     Shoulder Instructions  Home Living Family/patient expects to be discharged to:: Private residence Living Arrangements: Children (daughter) Available Help at Discharge: Family;Available PRN/intermittently Type of Home: House Home Access: Level entry     Home Layout: One level     Bathroom Shower/Tub: Occupational psychologist: Standard     Home Equipment: Crutches;Shower seat;Bedside commode;Hand held Tourist information centre manager - 2 wheels;Wheelchair -  manual;Grab bars - tub/shower          Prior Functioning/Environment Level of Independence: Independent with assistive device(s)        Comments: used crutches or RW        OT Problem List: Decreased strength;Decreased activity tolerance;Impaired balance (sitting and/or standing);Decreased knowledge of use of DME or AE;Pain      OT Treatment/Interventions: Self-care/ADL training;DME and/or AE instruction;Therapeutic activities;Patient/family education;Balance training    OT Goals(Current goals can be found in the care plan section) Acute Rehab OT Goals Patient Stated Goal: return home OT Goal Formulation: With patient Time For Goal Achievement: 04/08/21 Potential to Achieve Goals: Good ADL Goals Pt Will Perform Grooming: with supervision;standing Pt Will Perform Lower Body Bathing: with supervision;sit to/from stand Pt Will Perform Lower Body Dressing: with supervision;sit to/from stand Pt Will Transfer to Toilet: with supervision;ambulating;bedside commode Pt Will Perform Toileting - Clothing Manipulation and hygiene: with supervision;sit to/from stand Additional ADL Goal #1: Pt will perform bed mobility modified independently.  OT Frequency: Min 2X/week   Barriers to D/C: Decreased caregiver support          Co-evaluation PT/OT/SLP Co-Evaluation/Treatment: Yes Reason for Co-Treatment: Complexity of the patient's impairments (multi-system involvement);For patient/therapist safety   OT goals addressed during session: ADL's and self-care      AM-PAC OT "6 Clicks" Daily Activity     Outcome Measure Help from another person eating meals?: None Help from another person taking care of personal grooming?: A Little Help from another person toileting, which includes using toliet, bedpan, or urinal?: A Lot Help from another person bathing (including washing, rinsing, drying)?: A Lot Help from another person to put on and taking off regular upper body clothing?: A  Little Help from another person to put on and taking off regular lower body clothing?: A Lot 6 Click Score: 16   End of Session Equipment Utilized During Treatment: Gait belt;Rolling walker Nurse Communication: Mobility status  Activity Tolerance: Patient tolerated treatment well Patient left: in chair;with call bell/phone within reach;with bed alarm set  OT Visit Diagnosis: Unsteadiness on feet (R26.81);Other abnormalities of gait and mobility (R26.89);Pain;Muscle weakness (generalized) (M62.81)                Time: 8466-5993 OT Time Calculation (min): 22 min Charges:  OT General Charges $OT Visit: 1 Visit OT Evaluation $OT Eval Moderate Complexity: 1 Mod  Nestor Lewandowsky, OTR/L Acute Rehabilitation Services Pager: [phone] Office: [phone]  Malka So 03/26/2021, 10:21 AM

## 2021-03-26 NOTE — Progress Notes (Signed)
   03/26/21 1115  What Happened  Was fall witnessed? Yes  Who witnessed fall? Natalie NT  Patients activity before fall bathroom-assisted (and back to bed)  Point of contact other (comment);buttocks (was in a leaning squat up against NT legs)  Was patient injured? No  Follow Up  MD notified [place] Crane  Time MD notified 104  Family notified  Malakhi Markwood daughter)  Time family notified 1120  Additional tests No  Simple treatment Other (comment) (pain med administered per request prior to fall)  Progress note created (see row info) Yes  Adult Fall Risk Assessment  Risk Factor Category (scoring not indicated) Fall has occurred during this admission (document High fall risk)  Patient Fall Risk Level High fall risk  Adult Fall Risk Interventions  Required Bundle Interventions *See Row Information* High fall risk - low, moderate, and high requirements implemented  Additional Interventions Use of appropriate toileting equipment (bedpan, BSC, etc.);PT/OT need assessed if change in mobility from baseline

## 2021-03-26 NOTE — Evaluation (Signed)
Physical Therapy Evaluation Patient Details Name: Brad Harrell MRN: [id number] DOB: 05-06-1946 Today's Date: 03/26/2021   History of Present Illness  Pt is a 75 year old man admitted on 03/22/21 with scrotal abscess. Underwent orchiectomy and TURP on 03/25/21. Hospital course complicated by  AKI. PMH: DM2, HTN, hypothyroidism, BPH, CHF, CVA with residual R side weakness.  Clinical Impression  The patient required mod assistance to sit up, some difficulty due to pain in scrotal area when moving to sit. Mod assistance  To rise and  Stand and take a few steps to recliner, wide base. Patient lives with a daughter who works, per patient. Patient may benefit from short tern SNF  Pt admitted with above diagnosis.  Pt currently with functional limitations due to the deficits listed below (see PT Problem List). Pt will benefit from skilled PT to increase their independence and safety with mobility to allow discharge to the venue listed below.     Follow Up Recommendations SNF    Equipment Recommendations  None recommended by PT    Recommendations for Other Services       Precautions / Restrictions Precautions Precautions: Fall Precaution Comments: scrotal wounds, bladder irrigation going.      Mobility  Bed Mobility Overal bed mobility: Needs Assistance Bed Mobility: Rolling;Sidelying to Sit Rolling: Mod assist Sidelying to sit: Mod assist       General bed mobility comments: verbal cues for technique    Transfers Overall transfer level: Needs assistance Equipment used: Rolling walker (2 wheeled) Transfers: Sit to/from [facility] Sit to Stand: +2 physical assistance;Mod assist Stand pivot transfers: +2 safety/equipment;Mod assist       General transfer comment: assist to rise and steady  Ambulation/Gait Ambulation/Gait assistance: Mod assist Gait Distance (Feet): 4 Feet Assistive device: Rolling walker (2 wheeled) Gait Pattern/deviations: Step-to  pattern;Wide base of support     General Gait Details: steady assist for balance, tending  to have  legs appart  Stairs            Wheelchair Mobility    Modified Rankin (Stroke Patients Only)       Balance Overall balance assessment: Needs assistance   Sitting balance-Leahy Scale: Fair       Standing balance-Leahy Scale: Poor Standing balance comment: reliant on B UEs on walker and moderate assistance                             Pertinent Vitals/Pain Pain Assessment: 0-10 Pain Score: 8  Pain Location: scrotum Pain Descriptors / Indicators: Grimacing;Guarding;Discomfort Pain Intervention(s): Repositioned;Monitored during session;Limited activity within patient's tolerance    Home Living Family/patient expects to be discharged to:: Private residence Living Arrangements: Children (daughter) Available Help at Discharge: Family;Available PRN/intermittently Type of Home: House Home Access: Level entry     Home Layout: One level Home Equipment: Crutches;Shower seat;Bedside commode;Hand held Tourist information centre manager - 2 wheels;Wheelchair - manual;Grab bars - tub/shower      Prior Function Level of Independence: Independent with assistive device(s)         Comments: used crutches or RW     Hand Dominance   Dominant Hand: Right    Extremity/Trunk Assessment   Upper Extremity Assessment Upper Extremity Assessment: Overall WFL for tasks assessed    Lower Extremity Assessment Lower Extremity Assessment: Generalized weakness;RLE deficits/detail RLE Deficits / Details: mild deficits from stroke, able to take a step    Cervical / Trunk Assessment Cervical / Trunk  Assessment: Normal;Other exceptions Cervical / Trunk Exceptions: tends to lean to [employer]   Communication: No difficulties  Cognition Arousal/Alertness: Awake/alert Behavior During Therapy: WFL for tasks assessed/performed Overall Cognitive Status: Within  Functional Limits for tasks assessed                                        General Comments      Exercises     Assessment/Plan    PT Assessment Patient needs continued PT services  PT Problem List Decreased strength;Decreased mobility;Decreased activity tolerance;Decreased knowledge of use of DME;Pain;Decreased skin integrity       PT Treatment Interventions DME instruction;Therapeutic activities;Gait training;Therapeutic exercise;Patient/family education;Functional mobility training    PT Goals (Current goals can be found in the Care Plan section)  Acute Rehab PT Goals Patient Stated Goal: return home PT Goal Formulation: With patient Time For Goal Achievement: 04/09/21 Potential to Achieve Goals: Good    Frequency Min 2X/week   Barriers to discharge Decreased caregiver support      Co-evaluation PT/OT/SLP Co-Evaluation/Treatment: Yes Reason for Co-Treatment: For patient/therapist safety PT goals addressed during session: Mobility/safety with mobility OT goals addressed during session: ADL's and self-care       AM-PAC PT "6 Clicks" Mobility  Outcome Measure Help needed turning from your back to your side while in a flat bed without using bedrails?: A Lot Help needed moving from lying on your back to sitting on the side of a flat bed without using bedrails?: A Lot Help needed moving to and from a bed to a chair (including a wheelchair)?: A Lot Help needed standing up from a chair using your arms (e.g., wheelchair or bedside chair)?: A Lot Help needed to walk in hospital room?: A Lot Help needed climbing 3-5 steps with a railing? : Total 6 Click Score: 11    End of Session Equipment Utilized During Treatment: Gait belt Activity Tolerance: Patient tolerated treatment well Patient left: in chair;with call bell/phone within reach Nurse Communication: Mobility status PT Visit Diagnosis: Unsteadiness on feet (R26.81)    Time: 8144-8185 PT Time  Calculation (min) (ACUTE ONLY): 20 min   Charges:   PT Evaluation $PT Eval Low Complexity: 1 Low         Joshua Tree Pager [phone] Office [phone]  Claretha Cooper 03/26/2021, 10:42 AM

## 2021-03-26 NOTE — Progress Notes (Signed)
   03/26/21 1054  Assess: MEWS Score  Temp 98.1 F (36.7 C)  BP 101/86  Pulse Rate (!) 115  Resp 20  Level of Consciousness Alert  SpO2 100 %  O2 Device Room Air  Assess: if the MEWS score is Yellow or Red  Were vital signs taken at a resting state? Yes  Focused Assessment No change from prior assessment  Does the patient meet 2 or more of the SIRS criteria? No  MEWS guidelines implemented *See Row Information* Yes  Escalate  MEWS: Escalate Yellow: discuss with charge nurse/RN and consider discussing with provider and RRT  Notify: Charge Nurse/RN  Name of Charge Nurse/RN Notified Shelton Silvas RN  Date Charge Nurse/RN Notified 03/26/21  Time Charge Nurse/RN Notified 1113  Assess: SIRS CRITERIA  SIRS Temperature  0  SIRS Pulse 1  SIRS Respirations  0  SIRS WBC 0  SIRS Score Sum  1

## 2021-03-26 NOTE — Progress Notes (Signed)
Patient ID: Brad Harrell, male   DOB: Aug 14, 1946, 75 y.o.   MRN: [id number]  1 Day Post-Op Subjective: Pt without complaints.  Pain controlled.  S/P left orchiectomy and drainage of pyocele.  Also s/p TURP.  Objective: Vital signs in last 24 hours: Temp:  [97.5 F (36.4 C)-99.3 F (37.4 C)] 98.1 F (36.7 C) (05/28 1054) Pulse Rate:  [93-115] 115 (05/28 1054) Resp:  [11-20] 20 (05/28 1054) BP: (101-138)/(69-86) 101/86 (05/28 1054) SpO2:  [93 %-100 %] 100 % (05/28 1054) Weight:  [75 kg] 75 kg (05/28 0438)  Intake/Output from previous day: 05/27 0701 - 05/28 0700 In: 7150 [P.O.:240; I.V.:410] Out: 37482 [Urine:18050] Intake/Output this shift: Total I/O In: 240 [P.O.:240] Out: 2050 [Urine:2050]  Physical Exam:  General: Alert and oriented Abd: Soft, ND GU: Wound clean, urine clear with CBI on minimal drip  Lab Results: Recent Labs    03/25/21 0655 03/25/21 1307 03/26/21 0419  HGB 10.0* 10.3* 10.8*  HCT 33.0* 34.5* 35.1*   CBC Latest Ref Rng & Units 03/26/2021 03/25/2021 03/25/2021  WBC 4.0 - 10.5 K/uL 11.3(H) 8.8 5.3  Hemoglobin 13.0 - 17.0 g/dL 10.8(L) 10.3(L) 10.0(L)  Hematocrit 39.0 - 52.0 % 35.1(L) 34.5(L) 33.0(L)  Platelets 150 - 400 K/uL 329 296 347     BMET Recent Labs    03/25/21 0655 03/26/21 0419  NA 136 136  K 4.4 5.1  CL 103 100  CO2 27 27  GLUCOSE 123* 140*  BUN 21 21  CREATININE 1.06 1.05  CALCIUM 9.6 9.5     Studies/Results: Cultures pending, GNRs growing  Assessment/Plan: 1) BPH: S/P TURP yesterday.  Wean CBI off. 2) Pyocele: S/P left orchiectomy. Continue drain and dressing changes.  Continue Zosyn pending final cultures.   LOS: 4 days   Dutch Gray 03/26/2021, 11:34 AM

## 2021-03-26 NOTE — Progress Notes (Addendum)
PROGRESS NOTE  PIUS BYROM SNK:[id number] DOB: 1946-04-06 DOA: 03/21/2021 PCP: Nolene Ebbs, MD  HPI/Recap of past 24 hours:  Brad Harrell is a 75 y.o. male with a history of diabetes mellitus type 2, hypertension, hypothyroidism, BPH, chronic combined systolic and diastolic heart failure.  Patient presented secondary to left swelling with concern for scrotal abscess on initial imaging.  Urology was consulted on admission.  Urine cx sent due to pyuria.  Empiric IV antibiotics initiated.  Transfer[facility] to [facility] on 03/04/21, TRH to [facility]  POD# 0 post left scrotal exploration with left orchiectomy and drainage of pyocele, transurethral resection of prostate.  Discussed with Dr. Jeffie Pollock, urology, the left testicle was surrounded by pus and had to be removed.  He has Foley on irrigation following the TURP.  Has a wound drain from the scrotum that will need to stay a few days.  We plan to keep the Foley until Monday and consider drain removal then as well.  Continue empiric IV antibiotics until cultures result.  03/26/21: He feels better today and states he has minimal pain 1 out of 10 in the scrotum.  Reports situational insomnia.  As needed trazodone started.  Discussed resuming dual antiplatelet therapy with urology Dr. Alinda Money.  Okay to restart aspirin 81 mg daily and to start pharmacological DVT prophylaxis.  Deferring to Dr. Jeffie Pollock to resume dual antiplatelet therapy with Plavix when he returns to service on 03/27/2021.   Assessment/Plan: Principal Problem:   Scrotal abscess Active Problems:   Urinary retention   History of ischemic stroke   Renal insufficiency   Hyperkalemia   Chronic combined systolic and diastolic CHF (congestive heart failure) (HCC)   Hypothyroidism   Malnutrition of moderate degree  Left epididymo-orchitis with pyocele, POA, post left scrotal exploration with left orchiectomy and drainage of pyocele and transurethral resection of prostate on  03/25/2021. Continue IV Zosyn Appreciate urology's assistance. Discussed with Dr. Jeffie Pollock, urology, the left testicle was surrounded by pus and had to be removed.  He has Foley on irrigation following the TURP.  Has a wound drain from the scrotum that will need to stay a few days.  We plan to keep the Foley until Monday and consider drain removal then as well.  MRSA screen negative on 03/26/2021. Aerobic culture no growth x1 day.  Gram stain few gram-negative rods. Continue empiric IV antibiotics until cultures result.  Acute blood loss anemia postsurgery Hemoglobin stable and upprising 10.8 from 10.3 Monitor H&H while on antiplatelet and subcu Lovenox.  BPH with urinary retention Follows with urology outpatient. Urine culture collected on 03/21/2021 no growth Foley placed on 03/22/21. Post TURP on 03/25/2021 On home finasteride. Management per urology.  Appreciate assistance.  Chronic combined systolic and diastolic heart failure Last Transthoracic Echocardiogram from 2015 which revealed LVEF 30 to 35% with grade 1 diastolic dysfunction with diffuse hypokinesis.   Repeat Transthoracic Echocardiogram shows a worsened EF of 25-30% with left ventricular global hypokinesis Not on goal-directed medical therapy for his heart failure.   Cardiology consulted to assist with the management. Continue home Coreg. Start strict I's and O's and daily weight Net I&O -13.7 L  Resolved hyperkalemia posttreatment with Lokelma. Potassium 6.2 admission likely secondary to AKI.   Serum potassium 4.4> 5.1.  Resolved AKI likely prerenal and postrenal Presented with creatinine of 1.7.  He received IV fluid hydration. Creatinine 1.0 with GFR greater than 60. Continue to avoid nephrotoxic agents.  History of CVA Patient with resultant cognitive/memory impairment.  Currently oriented mostly, however. Patient is on Lipitor as an outpatient.  Aspirin and Plavix were held in anticipation to urology  procedure. Discussed resuming dual antiplatelet therapy with urology Dr. Alinda Money.  Okay to restart aspirin 81 mg daily and to start pharmacological DVT prophylaxis.  Deferring to Dr. Jeffie Pollock to resume dual antiplatelet therapy with Plavix when he returns to service on 03/27/2021. -Continue home Lipitor  Hypothyroidism -Continue home Synthroid  Diabetes mellitus, type 2 Patient is on Farxiga and metformin as an outpatient. Hemoglobin A1C 5.3% on this admission. Very well controlled. -Continue SSI -Hold off home hypoglycemics.  Situational insomnia Started trazodone nightly as needed.   DVT prophylaxis:  Subcu Lovenox daily. Code Status:   Full Code Family Communication: None at bedside Disposition Plan:  Discharge possibly to home on Monday or when urology signs off.  Recommendations/management, transition to oral antibiotics if needed   Consultants:   Urology  Procedures:   None  Antimicrobials:  Zosyn IV      Status is: Inpatient   Dispo:  Patient From: Home  Planned Disposition: Home with [facility], possibly on Monday, 03/28/2021 or when urology signs off.  Medically stable for discharge: No          Objective: Vitals:   03/26/21 0438 03/26/21 0501 03/26/21 1054 03/26/21 1315  BP:  138/69 101/86 116/71  Pulse:  100 (!) 115 (!) 106  Resp:  18 20 18   Temp:  99.3 F (37.4 C) 98.1 F (36.7 C) 98.4 F (36.9 C)  TempSrc:  Oral Oral   SpO2:  93% 100% 97%  Weight: 75 kg     Height:        Intake/Output Summary (Last 24 hours) at 03/26/2021 1521 Last data filed at 03/26/2021 5427 Gross per 24 hour  Intake 6490 ml  Output 14200 ml  Net -7710 ml   Filed Weights   03/24/21 0524 03/25/21 0821 03/26/21 0438  Weight: 80.6 kg 80.6 kg 75 kg    Exam:  . General: 75 y.o. year-old male frail-appearing no acute distress.  He is alert and interactive.   . Cardiovascular: Regular rate and rhythm no rubs or gallops.   Marland Kitchen Respiratory: Clear to  auscultation no wheezes or rales.   . Abdomen: Soft nontender normal bowel sounds present.   . Musculoskeletal: No lower extremity edema bilaterally. . Skin: No ulcerative lesions noted. Marland Kitchen Psychiatry: Mood is appropriate for condition and setting.   Data Reviewed: CBC: Recent Labs  Lab 03/21/21 2230 03/21/21 2251 03/23/21 0258 03/24/21 0222 03/25/21 0655 03/25/21 1307 03/26/21 0419  WBC 15.7*   < > 10.6* 7.7 5.3 8.8 11.3*  NEUTROABS 12.6*  --  7.2  --   --   --   --   HGB 10.0*   < > 10.0* 10.4* 10.0* 10.3* 10.8*  HCT 32.5*   < > 32.0* 33.7* 33.0* 34.5* 35.1*  MCV 80.8   < > 79.2* 79.7* 81.7 83.9 80.7  PLT 344   < > 428* 400 347 296 329   < > = values in this interval not displayed.   Basic Metabolic Panel: Recent Labs  Lab 03/22/21 0435 03/22/21 1111 03/23/21 0258 03/23/21 0848 03/23/21 1356 03/24/21 0222 03/25/21 0655 03/26/21 0419  NA 138  --  137  --   --  138 136 136  K 4.4   < > 4.2 4.2 4.1 4.2 4.4 5.1  CL 104  --  103  --   --  104 103  100  CO2 19*  --  21*  --   --  23 27 27   GLUCOSE 121*  --  96  --   --  156* 123* 140*  BUN 31*  --  24*  --   --  22 21 21   CREATININE 1.48*  --  1.11  --   --  1.18 1.06 1.05  CALCIUM 9.3  --  9.6  --   --  9.6 9.6 9.5  MG  --   --  2.0  --   --   --  2.0 2.0  PHOS  --   --  3.7  --   --   --  3.3 3.4   < > = values in this interval not displayed.   GFR: Estimated Creatinine Clearance: 53.4 mL/min (by C-G formula based on SCr of 1.05 mg/dL). Liver Function Tests: Recent Labs  Lab 03/23/21 0258  AST 18  ALT 14  ALKPHOS 55  BILITOT 1.0  PROT 8.0  ALBUMIN 2.7*   No results for input(s): LIPASE, AMYLASE in the last 168 hours. No results for input(s): AMMONIA in the last 168 hours. Coagulation Profile: Recent Labs  Lab 03/21/21 2230  INR 1.1   Cardiac Enzymes: No results for input(s): CKTOTAL, CKMB, CKMBINDEX, TROPONINI in the last 168 hours. BNP (last 3 results) No results for input(s): PROBNP in the last  8760 hours. HbA1C: No results for input(s): HGBA1C in the last 72 hours. CBG: Recent Labs  Lab 03/25/21 1208 03/25/21 1641 03/25/21 2055 03/26/21 0736 03/26/21 1120  GLUCAP 175* 156* 162* 136* 259*   Lipid Profile: No results for input(s): CHOL, HDL, LDLCALC, TRIG, CHOLHDL, LDLDIRECT in the last 72 hours. Thyroid Function Tests: No results for input(s): TSH, T4TOTAL, FREET4, T3FREE, THYROIDAB in the last 72 hours. Anemia Panel: No results for input(s): VITAMINB12, FOLATE, FERRITIN, TIBC, IRON, RETICCTPCT in the last 72 hours. Urine analysis:    Component Value Date/Time   COLORURINE AMBER (A) 03/21/2021 2243   APPEARANCEUR CLOUDY (A) 03/21/2021 2243   LABSPEC 1.022 03/21/2021 2243   PHURINE 5.0 03/21/2021 2243   GLUCOSEU >=500 (A) 03/21/2021 2243   HGBUR MODERATE (A) 03/21/2021 2243   BILIRUBINUR NEGATIVE 03/21/2021 2243   KETONESUR 20 (A) 03/21/2021 2243   PROTEINUR >=300 (A) 03/21/2021 2243   UROBILINOGEN 1.0 06/30/2014 1213   NITRITE NEGATIVE 03/21/2021 2243   LEUKOCYTESUR LARGE (A) 03/21/2021 2243   Sepsis Labs: @LABRCNTIP (procalcitonin:4,lacticidven:4)  ) Recent Results (from the past 240 hour(s))  Resp Panel by RT-PCR (Flu A&B, Covid) Nasopharyngeal Swab     Status: None   Collection Time: 03/21/21  8:40 PM   Specimen: Nasopharyngeal Swab; Nasopharyngeal(NP) swabs in vial transport medium  Result Value Ref Range Status   SARS Coronavirus 2 by RT PCR NEGATIVE NEGATIVE Final    Comment: (NOTE) SARS-CoV-2 target nucleic acids are NOT DETECTED.  The SARS-CoV-2 RNA is generally detectable in upper respiratory specimens during the acute phase of infection. The lowest concentration of SARS-CoV-2 viral copies this assay can detect is 138 copies/mL. A negative result does not preclude SARS-Cov-2 infection and should not be used as the sole basis for treatment or other patient management decisions. A negative result may occur with  improper specimen  collection/handling, submission of specimen other than nasopharyngeal swab, presence of viral mutation(s) within the areas targeted by this assay, and inadequate number of viral copies(<138 copies/mL). A negative result must be combined with clinical observations, patient history, and epidemiological information. The expected  result is Negative.  Fact Sheet for Patients:  EntrepreneurPulse.com.au  Fact Sheet for Healthcare Providers:  IncredibleEmployment.be  This test is no t yet approved or cleared by the [place] FDA and  has been authorized for detection and/or diagnosis of SARS-CoV-2 by FDA under an Emergency Use Authorization (EUA). This EUA will remain  in effect (meaning this test can be used) for the duration of the COVID-19 declaration under Section 564(b)(1) of the Act, 21 U.S.C.section 360bbb-3(b)(1), unless the authorization is terminated  or revoked sooner.       Influenza A by PCR NEGATIVE NEGATIVE Final   Influenza B by PCR NEGATIVE NEGATIVE Final    Comment: (NOTE) The Xpert Xpress SARS-CoV-2/FLU/RSV plus assay is intended as an aid in the diagnosis of influenza from Nasopharyngeal swab specimens and should not be used as a sole basis for treatment. Nasal washings and aspirates are unacceptable for Xpert Xpress SARS-CoV-2/FLU/RSV testing.  Fact Sheet for Patients: EntrepreneurPulse.com.au  Fact Sheet for Healthcare Providers: IncredibleEmployment.be  This test is not yet approved or cleared by the [place] FDA and has been authorized for detection and/or diagnosis of SARS-CoV-2 by FDA under an Emergency Use Authorization (EUA). This EUA will remain in effect (meaning this test can be used) for the duration of the COVID-19 declaration under Section 564(b)(1) of the Act, 21 U.S.C. section 360bbb-3(b)(1), unless the authorization is terminated or revoked.  Performed at [facility], [place] [address], [place], [place] [address]   Urine culture     Status: None   Collection Time: 03/21/21 10:43 PM   Specimen: Urine, Random  Result Value Ref Range Status   Specimen Description URINE, RANDOM  Final   Special Requests NONE  Final   Culture   Final    NO GROWTH Performed at [facility], [place] [address] [place], [place] [address]    Report Status 03/23/2021 FINAL  Final  Aerobic Culture w Gram Stain (superficial specimen)     Status: None (Preliminary result)   Collection Time: 03/25/21  9:49 AM   Specimen: Abscess  Result Value Ref Range Status   Specimen Description   Final    ABSCESS SCROTAL Performed at [place] [address] [place], [place] [address]    Special Requests   Final    NONE Performed at [facility], [place] [address], [place], [place] [address]    Gram Stain   Final    FEW WBC PRESENT, PREDOMINANTLY PMN FEW GRAM NEGATIVE RODS    Culture   Final    NO GROWTH 1 DAY Performed at [facility], [place] [address], [place], [place] [address]    Report Status PENDING  Incomplete  Anaerobic culture w Gram Stain     Status: None (Preliminary result)   Collection Time: 03/25/21  9:49 AM   Specimen: Abscess  Result Value Ref Range Status   Specimen Description   Final    ABSCESS SCROTAL Performed at [place] [address], [place], [place] [address]    Special Requests   Final    NONE Performed at [facility], [place] [address] [place], [place] [address]    Gram Stain   Final    ABUNDANT WBC PRESENT, PREDOMINANTLY PMN NO ORGANISMS SEEN Performed at [facility], [place] [address], [place], [place] [address]    Culture PENDING  Incomplete   Report Status PENDING  Incomplete  MRSA  PCR Screening     Status: None   Collection Time: 03/26/21  9:46 AM   Specimen: Nasal Mucosa; Nasopharyngeal  Result Value Ref Range Status   MRSA by PCR  NEGATIVE NEGATIVE Final    Comment:        The GeneXpert MRSA Assay (FDA approved for NASAL specimens only), is one component of a comprehensive MRSA colonization surveillance program. It is not intended to diagnose MRSA infection nor to guide or monitor treatment for MRSA infections. Performed at [facility], [place] [address], [place], [place] [address]       Studies: No results found.  Scheduled Meds: . aspirin EC  81 mg Oral Daily  . atorvastatin  20 mg Oral Daily  . carvedilol  3.125 mg Oral BID WC  . Chlorhexidine Gluconate Cloth  6 each Topical Daily  . enoxaparin (LOVENOX) injection  40 mg Subcutaneous Q24H  . feeding supplement  237 mL Oral BID BM  . finasteride  5 mg Oral Daily  . insulin aspart  0-6 Units Subcutaneous TID WC  . levothyroxine  100 mcg Oral QAC breakfast  . multivitamin with minerals  1 tablet Oral Daily  . polyethylene glycol  17 g Oral BID  . sodium chloride flush  10-40 mL Intracatheter Q12H    Continuous Infusions: . piperacillin-tazobactam (ZOSYN)  IV 3.375 g (03/26/21 1110)  . sodium chloride irrigation       LOS: 4 days     Kayleen Memos, MD [facility] Pager [phone]  If 7PM-7AM, please contact night-coverage [email or web address] Password [email or web address] 03/26/2021, 3:21 PM

## 2021-03-26 NOTE — Progress Notes (Signed)
   03/26/21 1056  Provider Notification  Provider Name/Title [place] Crane  Date Provider Notified 03/26/21  Time Provider Notified 1056  Notification Type Page  Notification Reason Other (Comment) (fall in room when NT was attempting to transfer back to bed, no injuries)

## 2021-03-27 DIAGNOSIS — Z8673 Personal history of transient ischemic attack (TIA), and cerebral infarction without residual deficits: Secondary | ICD-10-CM | POA: Diagnosis not present

## 2021-03-27 DIAGNOSIS — E875 Hyperkalemia: Secondary | ICD-10-CM | POA: Diagnosis not present

## 2021-03-27 DIAGNOSIS — E440 Moderate protein-calorie malnutrition: Secondary | ICD-10-CM

## 2021-03-27 DIAGNOSIS — I5042 Chronic combined systolic (congestive) and diastolic (congestive) heart failure: Secondary | ICD-10-CM | POA: Diagnosis not present

## 2021-03-27 DIAGNOSIS — N492 Inflammatory disorders of scrotum: Secondary | ICD-10-CM | POA: Diagnosis not present

## 2021-03-27 DIAGNOSIS — I429 Cardiomyopathy, unspecified: Secondary | ICD-10-CM | POA: Diagnosis not present

## 2021-03-27 LAB — GLUCOSE, CAPILLARY
Glucose-Capillary: 118 mg/dL — ABNORMAL HIGH (ref 70–99)
Glucose-Capillary: 143 mg/dL — ABNORMAL HIGH (ref 70–99)
Glucose-Capillary: 138 mg/dL — ABNORMAL HIGH (ref 70–99)
Glucose-Capillary: 211 mg/dL — ABNORMAL HIGH (ref 70–99)
Glucose-Capillary: 225 mg/dL — ABNORMAL HIGH (ref 70–99)

## 2021-03-27 LAB — CBC
HCT: 29.6 % — ABNORMAL LOW (ref 39.0–52.0)
Hemoglobin: 8.9 g/dL — ABNORMAL LOW (ref 13.0–17.0)
MCH: 24.6 pg — ABNORMAL LOW (ref 26.0–34.0)
MCHC: 30.1 g/dL (ref 30.0–36.0)
MCV: 81.8 fL (ref 80.0–100.0)
Platelets: 300 10*3/uL (ref 150–400)
RBC: 3.62 MIL/uL — ABNORMAL LOW (ref 4.22–5.81)
RDW: 17.8 % — ABNORMAL HIGH (ref 11.5–15.5)
WBC: 9.4 10*3/uL (ref 4.0–10.5)
nRBC: 0 % (ref 0.0–0.2)

## 2021-03-27 LAB — AEROBIC CULTURE W GRAM STAIN (SUPERFICIAL SPECIMEN): Culture: NO GROWTH

## 2021-03-27 LAB — BASIC METABOLIC PANEL
Anion gap: 5 (ref 5–15)
BUN: 17 mg/dL (ref 8–23)
CO2: 31 mmol/L (ref 22–32)
Calcium: 9.1 mg/dL (ref 8.9–10.3)
Chloride: 99 mmol/L (ref 98–111)
Creatinine, Ser: 1.1 mg/dL (ref 0.61–1.24)
GFR, Estimated: >60 mL/min (ref >60)
Glucose, Bld: 115 mg/dL — ABNORMAL HIGH (ref 70–99)
Potassium: 4.6 mmol/L (ref 3.5–5.1)
Sodium: 135 mmol/L (ref 135–145)

## 2021-03-27 LAB — PHOSPHORUS: Phosphorus: 2.8 mg/dL (ref 2.5–4.6)

## 2021-03-27 LAB — MAGNESIUM: Magnesium: 1.9 mg/dL (ref 1.7–2.4)

## 2021-03-27 MED ORDER — LOSARTAN POTASSIUM 25 MG PO TABS
25.0000 mg | ORAL_TABLET | Freq: Two times a day (BID) | ORAL | Status: DC
  Administered 2021-03-27 – 2021-03-28 (×4): 25 mg via ORAL
  Filled 2021-03-27 (×4): qty 1

## 2021-03-27 MED ORDER — FUROSEMIDE 10 MG/ML IJ SOLN
40.0000 mg | Freq: Every day | INTRAMUSCULAR | Status: DC
  Administered 2021-03-27: 40 mg via INTRAVENOUS
  Filled 2021-03-27: qty 4

## 2021-03-27 NOTE — Consult Note (Signed)
Cardiology Consultation:   Patient ID: Brad Harrell MRN: [id number]; DOB: 1946-01-06  Admit date: 03/21/2021 Date of Consult: 03/27/2021  PCP:  Nolene Ebbs, MD   [facility] Providers Cardiologist:  None        Patient Profile:   Brad Harrell is a 75 y.o. male  Admitted w scrotal abscess ( pyocele) w orchiectomy and drainage procedure cx by anemia, acute and now resolved renal injury and hyperkalemia (rx w/ lokelma)  who is being seen 03/27/2021 for the evaluation of CHF in the setting of long standing CM EF 35 % ( 2015), not on Guideline directed medical therapy  prior CVA x 3  and DM at the request of *Dr Nevada Crane  Remains on IV abx Echo 5/22  EF25-30%  History of Present Illness:   Brad Harrell admitted with scrotal pain and found to have abscess with symptoms of 1+ weeks duration. Course complicated as noted above and an echo obtained to reassess LV function prior to subsequently undertaking surgery.  Ejection fraction remains low as noted in 2015.  The patient has had no cardiac follow-up; he does follow-up with a PCP.  Significant dyspnea on exertion of less than 100 feet.  Denies orthopnea or nocturnal dyspnea.  Some peripheral edema.  Intermittent palpitations, described more as irregular and fast associated with shortness of breath.  No history of syncope or presyncope.  No chest pain.  No known coronary disease.  Does not smoke or drink times about 10 years  Recurrent strokes and so walks with Support Crutches   Past Medical History:  Diagnosis Date  . Anemia    Iron Defficiency  . Cardiomyopathy ([place])   . Combined systolic and diastolic congestive heart failure ([place])   . Diabetes mellitus without complication ([place])    TYPE 2  . Foley catheter in place    SINCE NOV 2017  . H/O cocaine abuse ([place])   . Hyperlipidemia   . Hypertension   . Hypothyroidism   . Liver abscess    measuring 7 x 9 cm/notes 03/04/2013  . Stroke ([place]) 2010   WEAK ON BOTH SIDES NOW  TOTAL OF 3 STROKES   . Urinary retention     Past Surgical History:  Procedure Laterality Date  . APPENDECTOMY    . COLONOSCOPY N/A 03/11/2013   Procedure: COLONOSCOPY;  Surgeon: Beryle Beams, MD;  Location: [place];  Service: Endoscopy;  Laterality: N/A;  . COLONOSCOPY WITH PROPOFOL N/A 03/15/2018   Procedure: COLONOSCOPY WITH PROPOFOL;  Surgeon: Carol Ada, MD;  Location: [facility];  Service: Endoscopy;  Laterality: N/A;  . CYSTOSCOPY WITH INSERTION OF UROLIFT N/A 01/04/2017   Procedure: CYSTOSCOPY WITH INSERTION OF UROLIFT x8;  Surgeon: Cleon Gustin, MD;  Location: [facility];  Service: Urology;  Laterality: N/A;  . ESOPHAGOGASTRODUODENOSCOPY N/A 03/11/2013   Procedure: ESOPHAGOGASTRODUODENOSCOPY (EGD);  Surgeon: Beryle Beams, MD;  Location: [facility];  Service: Endoscopy;  Laterality: N/A;  . GIVENS CAPSULE STUDY N/A 03/12/2013   Procedure: GIVENS CAPSULE STUDY;  Surgeon: Beryle Beams, MD;  Location: [place];  Service: Endoscopy;  Laterality: N/A;  . INGUINAL HERNIA REPAIR Right   . POLYPECTOMY  03/15/2018   Procedure: POLYPECTOMY;  Surgeon: Carol Ada, MD;  Location: [facility];  Service: Endoscopy;;  . SCROTAL EXPLORATION Left 03/25/2021   Procedure: LEFT SCROTUM EXPLORATION WITH ORCHIECTOMY;  Surgeon: [name], MD;  Location: [facility];  Service: Urology;  Laterality: Left;  . TIBIA FRACTURE SURGERY Left   .  TRANSURETHRAL RESECTION OF PROSTATE N/A 03/25/2021   Procedure: CYSTOSCOPY TRANSURETHRAL RESECTION OF THE PROSTATE (TURP);  Surgeon: [name], MD;  Location: [facility];  Service: Urology;  Laterality: N/A;       Inpatient Medications: Scheduled Meds: . aspirin EC  81 mg Oral Daily  . atorvastatin  20 mg Oral Daily  . carvedilol  3.125 mg Oral BID WC  . Chlorhexidine Gluconate Cloth  6 each Topical Daily  . enoxaparin (LOVENOX) injection  40 mg Subcutaneous Q24H  . feeding supplement  237 mL Oral BID BM  . finasteride  5 mg Oral Daily  . insulin  aspart  0-6 Units Subcutaneous TID WC  . levothyroxine  100 mcg Oral QAC breakfast  . multivitamin with minerals  1 tablet Oral Daily  . polyethylene glycol  17 g Oral BID  . sodium chloride flush  10-40 mL Intracatheter Q12H   Continuous Infusions: . piperacillin-tazobactam (ZOSYN)  IV 3.375 g (03/27/21 0450)  . sodium chloride irrigation     PRN Meds: acetaminophen **OR** acetaminophen, cyclobenzaprine, fentaNYL (SUBLIMAZE) injection, ondansetron **OR** ondansetron (ZOFRAN) IV, oxyCODONE, senna-docusate, sodium chloride flush, traZODone  Allergies:    Allergies  Allergen Reactions  . Rocephin [Ceftriaxone] Nausea And Vomiting    Social History:   Social History   Socioeconomic History  . Marital status: Widowed    Spouse name: Not on file  . Number of children: Not on file  . Years of education: Not on file  . Highest education level: Not on file  Occupational History  . Not on file  Tobacco Use  . Smoking status: Former Smoker    Packs/day: 1.00    Years: 51.00    Pack years: 51.00    Types: Cigarettes  . Smokeless tobacco: Never Used  . Tobacco comment: QUIT NOV 2010  Vaping Use  . Vaping Use: Never used  Substance and Sexual Activity  . Alcohol use: No    Comment: NOV 2010  . Drug use: Yes    Types: Cocaine    Comment: NOV 2010 LAST USED  . Sexual activity: Not Currently  Other Topics Concern  . Not on file  Social History Narrative  . Not on file   Social Determinants of Health   Financial Resource Strain: Not on file  Food Insecurity: Not on file  Transportation Needs: Not on file  Physical Activity: Not on file  Stress: Not on file  Social Connections: Not on file  Intimate Partner Violence: Not on file    Family History:     Family History  Problem Relation Age of Onset  . Hypertension Mother   . Hypertension Father   . Diabetes type II Father   . Hypertension Brother      ROS:  Please see the history of present illness.    All other  ROS reviewed and negative.     Physical Exam/Data:   Vitals:   03/26/21 2322 03/27/21 0459 03/27/21 0529 03/27/21 0600  BP: (!) 111/59  122/72   Pulse: 88  90   Resp: 20  16   Temp: 99 F (37.2 C)  98 F (36.7 C)   TempSrc: Oral  Oral   SpO2: 97%  99%   Weight:  76.7 kg  72 kg  Height:        Intake/Output Summary (Last 24 hours) at 03/27/2021 0816 Last data filed at 03/27/2021 0600 Gross per 24 hour  Intake 720 ml  Output 1000 ml  Net -280 ml  Last 3 Weights 03/27/2021 03/27/2021 03/26/2021  Weight (lbs) 158 lb 11.7 oz 169 lb 1.5 oz 165 lb 5.5 oz  Weight (kg) 72 kg 76.7 kg 75 kg     Body mass index is 28.12 kg/m.  General:  Well nourished, well developed, in no acute distress  HEENT: normal Lymph: no adenopathy Neck: JVD 8 cm + HJR Endocrine:  No thryomegaly Vascular: No carotid bruits; FA pulses 2+ bilaterally without bruits  Cardiac:  normal S1, S2; RRR; no murmur   Lungs: Bibasilar crackles Abd: soft, nontender, no hepatomegaly  Ext: no edema Musculoskeletal:  No deformities, BUE and BLE strength normal and equal Skin: warm and dry  Neuro:  CNs 2-12 intact, no focal abnormalities noted Psych:  Normal affect   EKG:  The EKG was personally reviewed 03/22/21  and demonstrates:SR @ 103 with PRWP and infer Qwaves Reviewed also 10/17 >> sinus with TW I anterior precordium  Telemetry:  Telemetry was personally reviewed and demonstrates: Sinus rhythm  Relevant CV Studies: (As above)   Laboratory Data:  High Sensitivity Troponin:  No results for input(s): TROPONINIHS in the last 720 hours.   Chemistry Recent Labs  Lab 03/25/21 0655 03/26/21 0419 03/27/21 0453  NA 136 136 135  K 4.4 5.1 4.6  CL 103 100 99  CO2 27 27 31   GLUCOSE 123* 140* 115*  BUN 21 21 17   CREATININE 1.06 1.05 1.10  CALCIUM 9.6 9.5 9.1  GFRNONAA >60 >60 >60  ANIONGAP 6 9 5     Recent Labs  Lab 03/23/21 0258  PROT 8.0  ALBUMIN 2.7*  AST 18  ALT 14  ALKPHOS 55  BILITOT 1.0    Hematology Recent Labs  Lab 03/25/21 1307 03/26/21 0419 03/27/21 0453  WBC 8.8 11.3* 9.4  RBC 4.11* 4.35 3.62*  HGB 10.3* 10.8* 8.9*  HCT 34.5* 35.1* 29.6*  MCV 83.9 80.7 81.8  MCH 25.1* 24.8* 24.6*  MCHC 29.9* 30.8 30.1  RDW 17.6* 17.7* 17.8*  PLT 296 329 300      Radiology/Studies:  US PELVIS LIMITED (TRANSABDOMINAL ONLY)  Result Date: 03/23/2021 CLINICAL DATA:  Benign prostatic hypertrophy EXAM: LIMITED ULTRASOUND OF PELVIS TECHNIQUE: Limited transabdominal ultrasound examination of the pelvis was performed. COMPARISON:  None. FINDINGS: The central prostatic gland is enlarged intrudes into the bladder lumen. The bladder is thick walled in keeping with changes of chronic bladder outlet obstruction. There is debris noted within the bladder lumen. Foley catheter balloon is seen within the bladder lumen. Volumetric measurements of the prostate gland are significantly limited by transabdominal technique and obscuration of much of the prostate gland by overlying osseous and enteric structures. The estimated dimensions of the prostate gland on this examination are 6.5 x 3.4 x 5.0 cm resulting in an estimated prostatic volume of 57 cc. However, in review of the prior CT examination of 11/11/2017, the prostate gland is seen measuring at least 5.2 x 5.5 x 7.7 cm resulting in a bullet volume of 144 cc. IMPRESSION: Technically limited examination. See above. Prostate volume on this examination is calculated at 57 cc but is likely inaccurate. Transrectal sonography or MRI examination is recommended for more definitive evaluation. Marked central prostatic hypertrophy with bladder wall thickening in keeping with changes of bladder outlet obstruction. Debris within the bladder lumen. Electronically Signed   By: Fidela Salisbury MD   On: 03/23/2021 23:50   ECHOCARDIOGRAM COMPLETE  Result Date: 03/24/2021    ECHOCARDIOGRAM REPORT   Patient Name:   Brad Harrell Date  of Exam: 03/24/2021 Medical Rec #:   [id number]        Height:       63.0 in Accession #:    [id number]       Weight:       177.7 lb Date of Birth:  May 04, 1946         BSA:          1.839 m Patient Age:    17 years         BP:           119/76 mmHg Patient Gender: M                HR:           95 bpm. Exam Location:  Inpatient Procedure: 2D Echo Indications:    congestive heart failure  History:        Patient has prior history of Echocardiogram examinations, most                 recent 07/01/2014. Risk Factors:Hypertension and Dyslipidemia.  Sonographer:    Johny Chess Referring Phys: [place]  1. Left ventricular ejection fraction, by estimation, is 25 to 30%. Left ventricular ejection fraction by 2D MOD biplane is 26.6 %. The left ventricle has severely decreased function. The left ventricle demonstrates global hypokinesis. Indeterminate diastolic filling due to E-A fusion.  2. Right ventricular systolic function is mildly reduced. The right ventricular size is normal. Tricuspid regurgitation signal is inadequate for assessing PA pressure.  3. The mitral valve is grossly normal. Trivial mitral valve regurgitation. No evidence of mitral stenosis.  4. The aortic valve is tricuspid. Aortic valve regurgitation is not visualized. No aortic stenosis is present.  5. There is mild dilatation of the ascending aorta, measuring 42 mm.  6. The inferior vena cava is normal in size with greater than 50% respiratory variability, suggesting right atrial pressure of 3 mmHg. FINDINGS  Left Ventricle: Left ventricular ejection fraction, by estimation, is 25 to 30%. Left ventricular ejection fraction by 2D MOD biplane is 26.6 %. The left ventricle has severely decreased function. The left ventricle demonstrates global hypokinesis. The left ventricular internal cavity size was normal in size. There is no left ventricular hypertrophy. Indeterminate diastolic filling due to E-A fusion. Right Ventricle: The right ventricular size is normal. No  increase in right ventricular wall thickness. Right ventricular systolic function is mildly reduced. Tricuspid regurgitation signal is inadequate for assessing PA pressure. Left Atrium: Left atrial size was normal in size. Right Atrium: Right atrial size was normal in size. Pericardium: Trivial pericardial effusion is present. Presence of pericardial fat pad. Mitral Valve: The mitral valve is grossly normal. Trivial mitral valve regurgitation. No evidence of mitral valve stenosis. Tricuspid Valve: The tricuspid valve is grossly normal. Tricuspid valve regurgitation is not demonstrated. No evidence of tricuspid stenosis. Aortic Valve: The aortic valve is tricuspid. Aortic valve regurgitation is not visualized. No aortic stenosis is present. Aortic valve mean gradient measures 1.9 mmHg. Aortic valve peak gradient measures 2.8 mmHg. Aortic valve area, by VTI measures 2.62 cm. Pulmonic Valve: The pulmonic valve was grossly normal. Pulmonic valve regurgitation is not visualized. No evidence of pulmonic stenosis. Aorta: The aortic root is normal in size and structure. There is mild dilatation of the ascending aorta, measuring 42 mm. Venous: The right upper pulmonary vein is normal. The inferior vena cava is normal in size with greater than 50% respiratory variability, suggesting right atrial pressure  of 3 mmHg. IAS/Shunts: The atrial septum is grossly normal.  LEFT VENTRICLE PLAX 2D                        Biplane EF (MOD) LVIDd:         5.20 cm         LV Biplane EF:   Left LVIDs:         4.30 cm                          ventricular LV PW:         1.20 cm                          ejection LV IVS:        1.00 cm                          fraction by LVOT diam:     2.40 cm                          2D MOD LV SV:         37                               biplane is LV SV Index:   20                               26.6 %. LVOT Area:     4.52 cm  LV Volumes (MOD) LV vol d, MOD    132.0 ml A2C: LV vol d, MOD    104.0 ml A4C: LV  vol s, MOD    87.3 ml A2C: LV vol s, MOD    84.0 ml A4C: LV SV MOD A2C:   44.7 ml LV SV MOD A4C:   104.0 ml LV SV MOD BP:    31.7 ml RIGHT VENTRICLE            IVC RV S prime:     9.57 cm/s  IVC diam: 1.40 cm TAPSE (M-mode): 1.8 cm LEFT ATRIUM             Index       RIGHT ATRIUM           Index LA diam:        3.40 cm 1.85 cm/m  RA Area:     13.00 cm LA Vol (A2C):   54.6 ml 29.69 ml/m RA Volume:   26.00 ml  14.14 ml/m LA Vol (A4C):   41.9 ml 22.79 ml/m LA Biplane Vol: 47.8 ml 25.99 ml/m  AORTIC VALVE AV Area (Vmax):    3.09 cm AV Area (Vmean):   2.66 cm AV Area (VTI):     2.62 cm AV Vmax:           84.16 cm/s AV Vmean:          67.339 cm/s AV VTI:            0.140 m AV Peak Grad:      2.8 mmHg AV Mean Grad:      1.9 mmHg LVOT Vmax:         57.43 cm/s LVOT Vmean:  39.632 cm/s LVOT VTI:          0.081 m LVOT/AV VTI ratio: 0.58  AORTA Ao Root diam: 3.30 cm Ao Asc diam:  4.20 cm  SHUNTS Systemic VTI:  0.08 m Systemic Diam: 2.40 cm Eleonore Chiquito MD Electronically signed by Eleonore Chiquito MD Signature Date/Time: 03/24/2021/3:54:08 PM    Final      Assessment and Plan:   Cardiomyopathy mechanism unknown  CHF chronic systolic  Scrotal abscess, s/p drainage and orchiectomy  Post Op anemia and renal insufficiency   Diabetes  Patient has persistent cardiomyopathy these intervening 8 years.  He has been started on low-dose carvedilol.  We will continue.  We will also add losartan to make sure his blood pressure will tolerate and then perhaps in the next 24-48 hours can switch to [facility] prior to discharge.  Would also benefit from spironolactone.  For now, especially given his scrotal abscess, would hold off on an SGLT2 which might also benefit his dyspnea.  Would benefit from outpatient Myoview scanning to determine the mechanism of his cardiomyopathy  Mildly volume overloaded.  We will give him 2 doses of furosemide  We will follow with you  Also discussed long-term follow-up and he is  agreeable to being established with [facility]         For questions or updates, please contact [place] Please consult [email or web address] for contact info under    Signed, Virl Axe, MD  03/27/2021 8:16 AM

## 2021-03-27 NOTE — Plan of Care (Signed)
  Problem: Pain Managment: Goal: General experience of comfort will improve Outcome: Progressing   Problem: Safety: Goal: Ability to remain free from injury will improve Outcome: Progressing   

## 2021-03-27 NOTE — Progress Notes (Signed)
   03/27/21 2119  Notify: Provider  Provider Name/Title Jonny Ruiz, MD  Date Provider Notified 03/27/21  Time Provider Notified 2119  Notification Type Page  Notification Reason Other (Comment) (BP 89/57)  Provider response In department;No new orders  Date of Provider Response 03/27/21  Time of Provider Response 2135    BP rechecked upon arrival of provider to floor. No new orders at this time will monitor patient.    Paul Dykes

## 2021-03-27 NOTE — Progress Notes (Signed)
2 Days Post-Op  Subjective: Brad Harrell is doing well s/p TURP and left orchiectomy for scrotal abscess and urinary retention.  He has no complaints.   Gram stain had GNR but culture is NG.  ROS:  ROS  Anti-infectives: Anti-infectives (From admission, onward)   Start     Dose/Rate Route Frequency Ordered Stop   03/22/21 0800  piperacillin-tazobactam (ZOSYN) IVPB 3.375 g        3.375 g 12.5 mL/hr over 240 Minutes Intravenous Every 8 hours 03/22/21 0000     03/22/21 0015  piperacillin-tazobactam (ZOSYN) IVPB 3.375 g        3.375 g 100 mL/hr over 30 Minutes Intravenous STAT 03/22/21 0000 03/22/21 0227      Current Facility-Administered Medications  Medication Dose Route Frequency Provider Last Rate Last Admin  . acetaminophen (TYLENOL) tablet 650 mg  650 mg Oral Q6H PRN [name], MD   650 mg at 03/24/21 2216   Or  . acetaminophen (TYLENOL) suppository 650 mg  650 mg Rectal Q6H PRN [name], MD      . aspirin EC tablet 81 mg  81 mg Oral Daily Irene Pap N, DO   81 mg at 03/26/21 1743  . atorvastatin (LIPITOR) tablet 20 mg  20 mg Oral Daily [name], MD   20 mg at 03/26/21 0941  . carvedilol (COREG) tablet 3.125 mg  3.125 mg Oral BID WC [name], MD   3.125 mg at 03/26/21 1743  . Chlorhexidine Gluconate Cloth 2 % PADS 6 each  6 each Topical Daily [name], MD   6 each at 03/26/21 [phone]  . cyclobenzaprine (FLEXERIL) tablet 10 mg  10 mg Oral TID PRN [name], MD   10 mg at 03/22/21 2235  . enoxaparin (LOVENOX) injection 40 mg  40 mg Subcutaneous Q24H Irene Pap N, DO   40 mg at 03/26/21 2147  . feeding supplement (ENSURE ENLIVE / ENSURE PLUS) liquid 237 mL  237 mL Oral BID BM [name], MD   237 mL at 03/26/21 0943  . fentaNYL (SUBLIMAZE) injection 25-50 mcg  25-50 mcg Intravenous Q2H PRN [name], MD   50 mcg at 03/25/21 1608  . finasteride (PROSCAR) tablet 5 mg  5 mg Oral Daily [name], MD   5 mg at 03/26/21 0941  . insulin aspart (novoLOG) injection 0-6  Units  0-6 Units Subcutaneous TID WC [name], MD   3 Units at 03/26/21 1217  . levothyroxine (SYNTHROID) tablet 100 mcg  100 mcg Oral QAC breakfast [name], MD   100 mcg at 03/26/21 0941  . multivitamin with minerals tablet 1 tablet  1 tablet Oral Daily [name], MD   1 tablet at 03/26/21 0941  . ondansetron (ZOFRAN) tablet 4 mg  4 mg Oral Q6H PRN [name], MD       Or  . ondansetron [facility]) injection 4 mg  4 mg Intravenous Q6H PRN [name], MD      . oxyCODONE (Oxy IR/ROXICODONE) immediate release tablet 5 mg  5 mg Oral Q6H PRN Irene Pap N, DO   5 mg at 03/26/21 1743  . piperacillin-tazobactam (ZOSYN) IVPB 3.375 g  3.375 g Intravenous Gretta Cool, MD 12.5 mL/hr at 03/27/21 0450 3.375 g at 03/27/21 0450  . polyethylene glycol (MIRALAX / GLYCOLAX) packet 17 g  17 g Oral BID [name], MD   17 g at 03/26/21 2147  . senna-docusate (Senokot-S) tablet 1 tablet  1 tablet Oral QHS PRN  [name], MD      . sodium chloride flush (NS) 0.9 % injection 10-40 mL  10-40 mL Intracatheter Q12H [name], MD   10 mL at 03/26/21 2147  . sodium chloride flush (NS) 0.9 % injection 10-40 mL  10-40 mL Intracatheter PRN [name], MD      . sodium chloride irrigation 0.9 % 3,000 mL  3,000 mL Irrigation Continuous [name], MD      . traZODone (DESYREL) tablet 50 mg  50 mg Oral QHS PRN Irene Pap N, DO         Objective: Vital signs in last 24 hours: Temp:  [98 F (36.7 C)-99 F (37.2 C)] 98 F (36.7 C) (05/29 0529) Pulse Rate:  [88-115] 90 (05/29 0529) Resp:  [16-20] 16 (05/29 0529) BP: (101-122)/(59-86) 122/72 (05/29 0529) SpO2:  [96 %-100 %] 99 % (05/29 0529) Weight:  [72 kg-76.7 kg] 72 kg (05/29 0600)  Intake/Output from previous day: 05/28 0701 - 05/29 0700 In: 720 [P.O.:720] Out: 3050 [Urine:3050] Intake/Output this shift: No intake/output data recorded.   Physical Exam Vitals reviewed.  Constitutional:      Appearance: Normal appearance.  Genitourinary:     Comments: Scrotal wound is clean and intact with mild induration.  Drain in good position. Foley draining clear urine off of CBI.  Neurological:     Mental Status: He is alert.     Lab Results:  Recent Labs    03/26/21 0419 03/27/21 0453  WBC 11.3* 9.4  HGB 10.8* 8.9*  HCT 35.1* 29.6*  PLT 329 300   BMET Recent Labs    03/26/21 0419 03/27/21 0453  NA 136 135  K 5.1 4.6  CL 100 99  CO2 27 31  GLUCOSE 140* 115*  BUN 21 17  CREATININE 1.05 1.10  CALCIUM 9.5 9.1   PT/INR No results for input(s): LABPROT, INR in the last 72 hours. ABG No results for input(s): PHART, HCO3 in the last 72 hours.  Invalid input(s): PCO2, PO2  Studies/Results: No results found.   Assessment and Plan: BPH with retention.  Doing well s/p TURP.  Will d/c foley on 5/30 for voiding trial.  Left scrotal abscess.  Doing well s/p left orchiectomy.   Will d/c drain on 5/30.    With negative culture but GNR on gram stain, continue zosyn until tomorrow.  CHF.  Could consider starting an 81mg  ASA but would prefer plavix be held for at least a week post op because of bleeding risk from the fresh TURP.       LOS: 5 days    [name] 03/27/2021 [phone] ID: [name], male   DOB: 04-Apr-1946, 75 y.o.   MRN: [id number]

## 2021-03-27 NOTE — Progress Notes (Signed)
PROGRESS NOTE    Brad Harrell  DXI:[id number] DOB: [birth year] DOA: 03/21/2021 PCP: Brad Ebbs, MD    Brief Narrative:  Brad Harrell was admitted to the hospital with working diagnosis of left epididymoorchitis pyocele complicated with severe sepsis (end organ failure AIKI-present on admission).   75 year old male past medical history for type 2 diabetes mellitus, hypertension, hypothyroidism, BPH, and systolic heart failure who presented with left scrotal pain and edema, despite outpatient treatment with ciprofloxacin.  Patient reported 5 to 7 days of left testicle pain, associated with erythema and edema.  On 5/20 he was started with ciprofloxacin with no major improvement.  On his initial physical examination blood pressure 157/85, heart rate 7610, respiratory rate 18, 22, temperature 97.7, oxygen saturation 99%.  His lungs are clear to auscultation bilaterally, heart S1-S2, present, rhythmic, soft abdomen, mild suprapubic tenderness, no lower extremity edema.  Sodium 135, potassium 6.2, chloride 99, bicarb 21, glucose 130, BUN 33, creatinine 1.70, white count 15.7, hemoglobin 10.0, hematocrit 32.5, platelets 344.  Patient was placed on IV antibiotics underwent left scrotal exploration with left orchiectomy and drainage of biopsy.  Transurethral resection of the prostate. Foley catheter was placed for irrigation.  Assessment & Plan:   Principal Problem:   Scrotal abscess Active Problems:   Urinary retention   History of ischemic stroke   Renal insufficiency   Hyperkalemia   Chronic combined systolic and diastolic CHF (congestive heart failure) (HCC)   Hypothyroidism   Malnutrition of moderate degree   1. Severe sepsis due to left scrotal abscess (end organ failure AKI) present on admission. BPH Sp left orchiectomy and drainage. Foley catheter still in place.  Wbc is 9,4, patient has been afebrile,   Surgical culture with no growth, gram stain with gram negative rods.   Continue with finesteride.   Antibiotic therapy with IV Zosyn.  Pain control with oxycodone,.   2. Acute post-operative anemia. Hgb today at 8,9 with Hct at 29,6. Will continue close monitoring of cell count. No current indication for PRBC transfusion.   3. Acute on Chronic systolic heart failure/ HTN No clinical signs of acute exacerbation, continue blood pressure monitoring.  Medical therapy with carvedilol and losartan.  Clinically more euvolemic, to consider change furosemide to po or change to as needed.   4. AKI with hyperkalemia renal function with serum cr down to 1,10 with K at 4,6 and serum bicarbonate at 31 Continue close follow up on renal function and electrolytes.   5. T2DM. dyslipidemia fasting glucose this am 115, continue close glucose cover and monitoring with insulin sliding scale.   Continue with atorvastatin.   6. Hypothyroidism continue with levothyroxine   7. Hx of CVA Continue blood pressure monitoring.   8. Moderate calorie protein malnutrition. Continue with nutritional supplementation. Consult PT/OT.   Patient continue to be at high risk for worsening sepsis   Status is: Inpatient  Remains inpatient appropriate because:IV treatments appropriate due to intensity of illness or inability to take PO   Dispo:  Patient From: Home  Planned Disposition: Home with Health Care Svc  Medically stable for discharge: No      DVT prophylaxis: Enoxaparin   Code Status:   full  Family Communication:  No family at the bedside      Nutrition Status: Nutrition Problem: Moderate Malnutrition Etiology: chronic illness Signs/Symptoms: moderate fat depletion,moderate muscle depletion,severe muscle depletion Interventions: Ensure Enlive (each supplement provides 350kcal and 20 grams of protein),Magic cup,MVI     Consultants:   Urology  Cardiology  Procedures:    left orchiectomy and drainage   Antimicrobials:       Subjective: Patient is  feeling very weak and deconditioned, not yet back to baseline, no nausea or vomiting, no chest pain.   Objective: Vitals:   03/26/21 2322 03/27/21 0459 03/27/21 0529 03/27/21 0600  BP: (!) 111/59  122/72   Pulse: 88  90   Resp: 20  16   Temp: 99 F (37.2 C)  98 F (36.7 C)   TempSrc: Oral  Oral   SpO2: 97%  99%   Weight:  76.7 kg  72 kg  Height:        Intake/Output Summary (Last 24 hours) at 03/27/2021 1335 Last data filed at 03/27/2021 0600 Gross per 24 hour  Intake 240 ml  Output 1000 ml  Net -760 ml   Filed Weights   03/26/21 0438 03/27/21 0459 03/27/21 0600  Weight: 75 kg 76.7 kg 72 kg    Examination:   General: Not in pain or dyspnea, deconditioned  Neurology: Awake and alert, non focal  E ENT: no pallor, no icterus, oral mucosa moist Cardiovascular: No JVD. S1-S2 present, rhythmic, no gallops, rubs, or murmurs. No lower extremity edema. Pulmonary: positive breath sounds bilaterally, with no wheezing, rhonchi or rales. Gastrointestinal. Abdomen soft and non tender Skin. No rashes Musculoskeletal: no joint deformities     Data Reviewed: I have personally reviewed following labs and imaging studies  CBC: Recent Labs  Lab 03/21/21 2230 03/21/21 2251 03/23/21 0258 03/24/21 0222 03/25/21 0655 03/25/21 1307 03/26/21 0419 03/27/21 0453  WBC 15.7*   < > 10.6* 7.7 5.3 8.8 11.3* 9.4  NEUTROABS 12.6*  --  7.2  --   --   --   --   --   HGB 10.0*   < > 10.0* 10.4* 10.0* 10.3* 10.8* 8.9*  HCT 32.5*   < > 32.0* 33.7* 33.0* 34.5* 35.1* 29.6*  MCV 80.8   < > 79.2* 79.7* 81.7 83.9 80.7 81.8  PLT 344   < > 428* 400 347 296 329 300   < > = values in this interval not displayed.   Basic Metabolic Panel: Recent Labs  Lab 03/23/21 0258 03/23/21 0848 03/23/21 1356 03/24/21 0222 03/25/21 0655 03/26/21 0419 03/27/21 0453  NA 137  --   --  138 136 136 135  K 4.2   < > 4.1 4.2 4.4 5.1 4.6  CL 103  --   --  104 103 100 99  CO2 21*  --   --  23 27 27 31   GLUCOSE 96   --   --  156* 123* 140* 115*  BUN 24*  --   --  22 21 21 17   CREATININE 1.11  --   --  1.18 1.06 1.05 1.10  CALCIUM 9.6  --   --  9.6 9.6 9.5 9.1  MG 2.0  --   --   --  2.0 2.0 1.9  PHOS 3.7  --   --   --  3.3 3.4 2.8   < > = values in this interval not displayed.   GFR: Estimated Creatinine Clearance: 50 mL/min (by C-G formula based on SCr of 1.1 mg/dL). Liver Function Tests: Recent Labs  Lab 03/23/21 0258  AST 18  ALT 14  ALKPHOS 55  BILITOT 1.0  PROT 8.0  ALBUMIN 2.7*   No results for input(s): LIPASE, AMYLASE in the last 168 hours. No results for input(s): AMMONIA in the  last 168 hours. Coagulation Profile: Recent Labs  Lab 03/21/21 2230  INR 1.1   Cardiac Enzymes: No results for input(s): CKTOTAL, CKMB, CKMBINDEX, TROPONINI in the last 168 hours. BNP (last 3 results) No results for input(s): PROBNP in the last 8760 hours. HbA1C: No results for input(s): HGBA1C in the last 72 hours. CBG: Recent Labs  Lab 03/26/21 1120 03/26/21 1702 03/26/21 2108 03/27/21 0743 03/27/21 1304  GLUCAP 259* 131* 246* 118* 143*   Lipid Profile: No results for input(s): CHOL, HDL, LDLCALC, TRIG, CHOLHDL, LDLDIRECT in the last 72 hours. Thyroid Function Tests: No results for input(s): TSH, T4TOTAL, FREET4, T3FREE, THYROIDAB in the last 72 hours. Anemia Panel: No results for input(s): VITAMINB12, FOLATE, FERRITIN, TIBC, IRON, RETICCTPCT in the last 72 hours.    Radiology Studies: I have reviewed all of the imaging during this hospital visit personally     Scheduled Meds: . aspirin EC  81 mg Oral Daily  . atorvastatin  20 mg Oral Daily  . carvedilol  3.125 mg Oral BID WC  . Chlorhexidine Gluconate Cloth  6 each Topical Daily  . enoxaparin (LOVENOX) injection  40 mg Subcutaneous Q24H  . feeding supplement  237 mL Oral BID BM  . finasteride  5 mg Oral Daily  . furosemide  40 mg Intravenous Daily  . insulin aspart  0-6 Units Subcutaneous TID WC  . levothyroxine  100 mcg  Oral QAC breakfast  . losartan  25 mg Oral q12n4p  . multivitamin with minerals  1 tablet Oral Daily  . polyethylene glycol  17 g Oral BID  . sodium chloride flush  10-40 mL Intracatheter Q12H   Continuous Infusions: . piperacillin-tazobactam (ZOSYN)  IV 3.375 g (03/27/21 1238)  . sodium chloride irrigation       LOS: 5 days        Brenlynn Fake Gerome Apley, MD

## 2021-03-28 DIAGNOSIS — I5043 Acute on chronic combined systolic (congestive) and diastolic (congestive) heart failure: Secondary | ICD-10-CM

## 2021-03-28 DIAGNOSIS — N492 Inflammatory disorders of scrotum: Secondary | ICD-10-CM | POA: Diagnosis not present

## 2021-03-28 DIAGNOSIS — E039 Hypothyroidism, unspecified: Secondary | ICD-10-CM | POA: Diagnosis not present

## 2021-03-28 DIAGNOSIS — Z8673 Personal history of transient ischemic attack (TIA), and cerebral infarction without residual deficits: Secondary | ICD-10-CM | POA: Diagnosis not present

## 2021-03-28 DIAGNOSIS — I428 Other cardiomyopathies: Secondary | ICD-10-CM | POA: Diagnosis not present

## 2021-03-28 DIAGNOSIS — E875 Hyperkalemia: Secondary | ICD-10-CM | POA: Diagnosis not present

## 2021-03-28 LAB — GLUCOSE, CAPILLARY
Glucose-Capillary: 133 mg/dL — ABNORMAL HIGH (ref 70–99)
Glucose-Capillary: 242 mg/dL — ABNORMAL HIGH (ref 70–99)
Glucose-Capillary: 151 mg/dL — ABNORMAL HIGH (ref 70–99)
Glucose-Capillary: 216 mg/dL — ABNORMAL HIGH (ref 70–99)

## 2021-03-28 MED ORDER — FUROSEMIDE 40 MG PO TABS
40.0000 mg | ORAL_TABLET | Freq: Every day | ORAL | Status: DC
  Administered 2021-03-28: 40 mg via ORAL
  Filled 2021-03-28: qty 1

## 2021-03-28 NOTE — Care Management Important Message (Signed)
Medicare IM given to the patient by Ethyl Vila. 

## 2021-03-28 NOTE — Progress Notes (Addendum)
Patient incontinent of urine.  One large occurrence of yellow urine in the bed.  Condom cath placed.  Will continue to monitor.   Patient pulled off condom cath.  Another large occurrence of urine in bed.  Unable to do string of bottles at this time.

## 2021-03-28 NOTE — TOC Initial Note (Signed)
Transition of Care [facility]) - Initial/Assessment Note    Patient Details  Name: Brad Harrell MRN: [id number] Date of Birth: [birth year]  Transition of [facility]) CM/SW Contact:    Dessa Phi, RN Phone Number: 03/28/2021, 12:23 PM  Clinical Narrative:Spoke to patient/dtr [name] both agree to SNF-fax out await bed offers.                   Expected Discharge Plan: Skilled Nursing Facility Barriers to Discharge: Continued Medical Work up   Patient Goals and CMS Choice Patient states their goals for this hospitalization and ongoing recovery are:: go to rehab CMS Medicare.gov Compare Post Acute Care list provided to:: Patient Represenative (must comment) (dtr Jaynie Crumble) Choice offered to / list presented to : Adult Children  Expected Discharge Plan and Services Expected Discharge Plan: [place]   Discharge Planning Services: CM Consult   Living arrangements for the past 2 months: Single Family Home                                      Prior Living Arrangements/Services Living arrangements for the past 2 months: Single Family Home Lives with:: Adult Children Patient language and need for interpreter reviewed:: Yes Do you feel safe going back to the place where you live?: Yes      Need for Family Participation in Patient Care: No (Comment) Care giver support system in place?: Yes (comment)   Criminal Activity/Legal Involvement Pertinent to Current Situation/Hospitalization: No - Comment as needed  Activities of Daily Living Home Assistive Devices/Equipment: Bathtub lift ADL Screening (condition at time of admission) Patient's cognitive ability adequate to safely complete daily activities?: Yes Is the patient deaf or have difficulty hearing?: No Does the patient have difficulty seeing, even when wearing glasses/contacts?: Yes (r eye blind) Does the patient have difficulty concentrating, remembering, or making decisions?: Yes Patient able  to express need for assistance with ADLs?: Yes Does the patient have difficulty dressing or bathing?: No Independently performs ADLs?: Yes (appropriate for developmental age) Does the patient have difficulty walking or climbing stairs?: Yes Weakness of Legs: Both Weakness of Arms/Hands: Both  Permission Sought/Granted Permission sought to share information with : Case Manager Permission granted to share information with : Yes, Verbal Permission Granted  Share Information with NAME: Case Manager     Permission granted to share info w Relationship: [name] dtr [phone]     Emotional Assessment Appearance:: Appears stated age Attitude/Demeanor/Rapport: Gracious Affect (typically observed): Accepting Orientation: : Oriented to Self,Oriented to Place,Oriented to  Time Alcohol / Substance Use: Not Applicable Psych Involvement: No (comment)  Admission diagnosis:  Dehydration [E86.0] Scrotal pain [N50.82] Scrotal abscess [N49.2] Swelling of left testicle [N50.89] Patient Active Problem List   Diagnosis Date Noted  . Malnutrition of moderate degree 03/24/2021  . Scrotal abscess 03/22/2021  . Renal insufficiency 03/22/2021  . Hyperkalemia 03/22/2021  . Chronic combined systolic and diastolic CHF (congestive heart failure) ([place]) 03/22/2021  . Hypothyroidism 03/22/2021  . Hypothermia 08/21/2016  . Metabolic acidosis 19/50/9326  . Elevated lactic acid level 08/21/2016  . Combined systolic and diastolic congestive heart failure ([place])   . Diabetes mellitus, new onset ([place]) 07/01/2014  . Left arm weakness 07/01/2014  . History of CVA  07/01/2014  . Urinary tract infection 07/01/2014  . Thrombocytopenia ([place]) 07/01/2014  . DKA (diabetic ketoacidoses) 06/30/2014  . Physical deconditioning 03/13/2013  .  Anemia, iron deficiency 03/08/2013  . Bacteremia 03/06/2013  . History of Liver abscess 03/05/2013  . Encephalopathy, metabolic 27/51/7001  . Anemia 03/05/2013  . Complicated UTI  (urinary tract infection) 08/15/2012  . SIRS (systemic inflammatory response syndrome) ([place]) 08/15/2012  . ARF (acute renal failure) ([place]) 08/15/2012  . Hypertension   . Urinary retention   . History of ischemic stroke   . Hyperlipidemia   . Cardiomyopathy ([place])   . H/O cocaine abuse ([place])    PCP:  Nolene Ebbs, MD Pharmacy:   [facility] ([place]), [place] - [address] [place] ([place]) [place] [address] Phone: [phone] Fax: [phone]     Social Determinants of Health (SDOH) Interventions    Readmission Risk Interventions No flowsheet data found.

## 2021-03-28 NOTE — Progress Notes (Signed)
3 Days Post-Op  Subjective: Brad Harrell is doing well s/p TURP and left orchiectomy for scrotal abscess and urinary retention.  He has no complaints.     ROS:  ROS  Anti-infectives: Anti-infectives (From admission, onward)   Start     Dose/Rate Route Frequency Ordered Stop   03/22/21 0800  piperacillin-tazobactam (ZOSYN) IVPB 3.375 g        3.375 g 12.5 mL/hr over 240 Minutes Intravenous Every 8 hours 03/22/21 0000     03/22/21 0015  piperacillin-tazobactam (ZOSYN) IVPB 3.375 g        3.375 g 100 mL/hr over 30 Minutes Intravenous STAT 03/22/21 0000 03/22/21 0227      Current Facility-Administered Medications  Medication Dose Route Frequency Provider Last Rate Last Admin  . acetaminophen (TYLENOL) tablet 650 mg  650 mg Oral Q6H PRN [name], MD   650 mg at 03/24/21 2216   Or  . acetaminophen (TYLENOL) suppository 650 mg  650 mg Rectal Q6H PRN [name], MD      . aspirin EC tablet 81 mg  81 mg Oral Daily Irene Pap N, DO   81 mg at 03/27/21 9937  . atorvastatin (LIPITOR) tablet 20 mg  20 mg Oral Daily [name], MD   20 mg at 03/27/21 0939  . carvedilol (COREG) tablet 3.125 mg  3.125 mg Oral BID WC [name], MD   3.125 mg at 03/27/21 1729  . Chlorhexidine Gluconate Cloth 2 % PADS 6 each  6 each Topical Daily [name], MD   6 each at 03/27/21 [phone]  . cyclobenzaprine (FLEXERIL) tablet 10 mg  10 mg Oral TID PRN [name], MD   10 mg at 03/22/21 2235  . enoxaparin (LOVENOX) injection 40 mg  40 mg Subcutaneous Q24H Irene Pap N, DO   40 mg at 03/27/21 2048  . feeding supplement (ENSURE ENLIVE / ENSURE PLUS) liquid 237 mL  237 mL Oral BID BM [name], MD   237 mL at 03/27/21 0939  . fentaNYL (SUBLIMAZE) injection 25-50 mcg  25-50 mcg Intravenous Q2H PRN [name], MD   50 mcg at 03/25/21 1608  . finasteride (PROSCAR) tablet 5 mg  5 mg Oral Daily [name], MD   5 mg at 03/27/21 7893  . furosemide (LASIX) tablet 40 mg  40 mg Oral Daily Minus Breeding, MD      .  insulin aspart (novoLOG) injection 0-6 Units  0-6 Units Subcutaneous TID WC [name], MD   3 Units at 03/26/21 1217  . levothyroxine (SYNTHROID) tablet 100 mcg  100 mcg Oral QAC breakfast [name], MD   100 mcg at 03/27/21 0939  . losartan (COZAAR) tablet 25 mg  25 mg Oral q12n4p Deboraha Sprang, MD   25 mg at 03/27/21 1730  . multivitamin with minerals tablet 1 tablet  1 tablet Oral Daily [name], MD   1 tablet at 03/27/21 [phone]  . ondansetron (ZOFRAN) tablet 4 mg  4 mg Oral Q6H PRN [name], MD       Or  . ondansetron [facility]) injection 4 mg  4 mg Intravenous Q6H PRN [name], MD      . oxyCODONE (Oxy IR/ROXICODONE) immediate release tablet 5 mg  5 mg Oral Q6H PRN Irene Pap N, DO   5 mg at 03/28/21 0443  . piperacillin-tazobactam (ZOSYN) IVPB 3.375 g  3.375 g Intravenous Gretta Cool, MD 12.5 mL/hr at 03/28/21 0441 3.375 g at 03/28/21 0441  . polyethylene  glycol (MIRALAX / GLYCOLAX) packet 17 g  17 g Oral BID [name], MD   17 g at 03/27/21 2048  . senna-docusate (Senokot-S) tablet 1 tablet  1 tablet Oral QHS PRN [name], MD      . sodium chloride flush (NS) 0.9 % injection 10-40 mL  10-40 mL Intracatheter Q12H [name], MD   10 mL at 03/27/21 2047  . sodium chloride flush (NS) 0.9 % injection 10-40 mL  10-40 mL Intracatheter PRN [name], MD      . sodium chloride irrigation 0.9 % 3,000 mL  3,000 mL Irrigation Continuous [name], MD      . traZODone (DESYREL) tablet 50 mg  50 mg Oral QHS PRN Kayleen Memos, DO         Objective: Vital signs in last 24 hours: Temp:  [97.6 F (36.4 C)-98.7 F (37.1 C)] 98.1 F (36.7 C) (05/30 0535) Pulse Rate:  [81-89] 81 (05/30 0535) Resp:  [17-20] 17 (05/30 0535) BP: (89-115)/(50-79) 105/66 (05/30 0535) SpO2:  [99 %] 99 % (05/30 0535) Weight:  [72.4 kg] 72.4 kg (05/30 0500)  Intake/Output from previous day: 05/29 0701 - 05/30 0700 In: 550.6 [IV Piggyback:550.6] Out: 1600 [Urine:1600] Intake/Output this  shift: No intake/output data recorded.   Physical Exam Vitals reviewed.  Constitutional:      Appearance: Normal appearance.  Genitourinary:    Comments: Scrotal wound is clean and intact with mild induration.  Drain removed, Foley draining clear urine off of CBI.   Foley removed.  Neurological:     Mental Status: He is alert.     Lab Results:  Recent Labs    03/26/21 0419 03/27/21 0453  WBC 11.3* 9.4  HGB 10.8* 8.9*  HCT 35.1* 29.6*  PLT 329 300   BMET Recent Labs    03/26/21 0419 03/27/21 0453  NA 136 135  K 5.1 4.6  CL 100 99  CO2 27 31  GLUCOSE 140* 115*  BUN 21 17  CREATININE 1.05 1.10  CALCIUM 9.5 9.1   PT/INR No results for input(s): LABPROT, INR in the last 72 hours. ABG No results for input(s): PHART, HCO3 in the last 72 hours.  Invalid input(s): PCO2, PO2  Studies/Results: No results found.   Assessment and Plan: BPH with retention.  Doing well s/p TURP.  Voiding trial today  Left scrotal abscess.  Doing well s/p left orchiectomy.   Drain D/C'd.  CHF.  Could consider starting an 81mg  ASA but would prefer plavix be held for at least a week post op because of bleeding risk from the fresh TURP.       LOS: 6 days    [name] 03/28/2021 [phone] ID: [name], male   DOB: [birth year], 75 y.o.   MRN: [id number] Patient ID: NELLO CORRO, male   DOB: [birth year], 75 y.o.   MRN: [id number]

## 2021-03-28 NOTE — Progress Notes (Signed)
PROGRESS NOTE    Brad Harrell  MVE:[id number] DOB: September 28, 1946 DOA: 03/21/2021 PCP: Nolene Ebbs, MD    Brief Narrative:  Brad Harrell was admitted to the hospital with working diagnosis of left epididymoorchitis /pyocele complicated with severe sepsis (end organ failure AIKI-present on admission).   75 year old male past medical history for type 2 diabetes mellitus, hypertension, hypothyroidism, BPH, and systolic heart failure who presented with left scrotal pain and edema, worsening symptoms despite outpatient treatment with oral ciprofloxacin.  Patient reported 5 to 7 days of left testicle pain, associated with erythema and edema.  On 5/20 he was started with ciprofloxacin with no major improvement.  On his initial physical examination blood pressure 157/85, heart rate 7610, respiratory rate 18, 22, temperature 97.7, oxygen saturation 99%.  His lungs were clear to auscultation bilaterally, heart S1-S2, present, rhythmic, soft abdomen, mild suprapubic tenderness, no lower extremity edema.  Sodium 135, potassium 6.2, chloride 99, bicarb 21, glucose 130, BUN 33, creatinine 1.70, white count 15.7, hemoglobin 10.0, hematocrit 32.5, platelets 344.  Patient was placed on IV antibiotics underwent left scrotal exploration with left orchiectomy and drainage of biopsy.  Transurethral resection of the prostate. Foley catheter was placed for irrigation sp TURP.   Patient recovering well form surgical intervention. Required diuresis for decompensated heart failure.  Left scrotal drain discontinued.  Plan for voiding trial today.  Patient will need SNF placement.   Assessment & Plan:   Principal Problem:   Scrotal abscess Active Problems:   Urinary retention   History of ischemic stroke   Renal insufficiency   Hyperkalemia   Chronic combined systolic and diastolic CHF (congestive heart failure) (HCC)   Hypothyroidism   Malnutrition of moderate degree    1. Severe sepsis due to left  scrotal abscess (end organ failure AKI) present on admission. BPH with urinary retention.  Sp left orchiectomy and drainage. Sp TURP.   Surgical culture so far with no growth, gram stain with gram negative rods.   On IV Zosyn #6, he has been afebrile, wbc has been trending down.  No significant pain, surgical drain and foley have been removed.  Continue with as needed acetaminophen, fentanyl (last dose on 05/27) and oxycodone for pain control.  Flexeril 10 mg as needed for muscle spasms.   Continue with finasteride.   2. Acute post-operative anemia.  Follow cell count in am, his Hgb and Hct have been stable.   3. Acute on Chronic systolic heart failure/ HTN.  Improved volume status, now on furosemide po for volume control. Continue with carvedilol and losartan with good toleration.  Will follow up with cardiology as outpatient.   4. AKI with hyperkalemia  His renal function and electrolytes have been stable, will follow up on renal function in am. Continue with furosemide 40 mg po daily.   5. T2DM. dyslipidemia capillary glucose has been 225, 133 and 242. Continue glucose cover and monitoring with insulin sliding scale Holding basal insulin to prevent hypoglycemia.   On atorvastatin.   6. Hypothyroidism On levothyroxine   7. Hx of CVA Blood pressure control. Continue with aspirin and statin therapy.   8. Moderate calorie protein malnutrition. Continue with nutritional supplements.    Status is: Inpatient  Remains inpatient appropriate because:Inpatient level of care appropriate due to severity of illness   Dispo:  Patient From: Home  Planned Disposition: Home with Health Care Svc  Medically stable for discharge: No     DVT prophylaxis: Enoxaparin   Code Status:   full  Family Communication:  No family at the bedside      Nutrition Status: Nutrition Problem: Moderate Malnutrition Etiology: chronic illness Signs/Symptoms: moderate fat depletion,moderate  muscle depletion,severe muscle depletion Interventions: Ensure Enlive (each supplement provides 350kcal and 20 grams of protein),Magic cup,MVI     Consultants:   Urology   Cardiology   Procedures:   Left orchectomy with drainage   TURP   Antimicrobials:   IV zosyn     Subjective: Patient feeling better, surgical drain and foley catheter have been removed, no nausea or vomiting, no abdominal or chest pain.   Objective: Vitals:   03/28/21 0500 03/28/21 0535 03/28/21 0938 03/28/21 1158  BP:  105/66 104/67 113/68  Pulse:  81  (!) 58  Resp:  17  18  Temp:  98.1 F (36.7 C)  98.4 F (36.9 C)  TempSrc:    Oral  SpO2:  99%  95%  Weight: 72.4 kg     Height:        Intake/Output Summary (Last 24 hours) at 03/28/2021 1212 Last data filed at 03/28/2021 1100 Gross per 24 hour  Intake 910.64 ml  Output 1600 ml  Net -689.36 ml   Filed Weights   03/27/21 0459 03/27/21 0600 03/28/21 0500  Weight: 76.7 kg 72 kg 72.4 kg    Examination:   General: Not in pain or dyspnea, deconditioned  Neurology: Awake and alert, non focal  E ENT: no pallor, no icterus, oral mucosa moist Cardiovascular: No JVD. S1-S2 present, rhythmic, no gallops, rubs, or murmurs. No lower extremity edema. Pulmonary: positive breath sounds bilaterally, adequate air movement, no wheezing, rhonchi or rales. Gastrointestinal. Abdomen  Soft and non tender Skin. No rashes Musculoskeletal: no joint deformities     Data Reviewed: I have personally reviewed following labs and imaging studies  CBC: Recent Labs  Lab 03/21/21 2230 03/21/21 2251 03/23/21 0258 03/24/21 0222 03/25/21 0655 03/25/21 1307 03/26/21 0419 03/27/21 0453  WBC 15.7*   < > 10.6* 7.7 5.3 8.8 11.3* 9.4  NEUTROABS 12.6*  --  7.2  --   --   --   --   --   HGB 10.0*   < > 10.0* 10.4* 10.0* 10.3* 10.8* 8.9*  HCT 32.5*   < > 32.0* 33.7* 33.0* 34.5* 35.1* 29.6*  MCV 80.8   < > 79.2* 79.7* 81.7 83.9 80.7 81.8  PLT 344   < > 428* 400 347  296 329 300   < > = values in this interval not displayed.   Basic Metabolic Panel: Recent Labs  Lab 03/23/21 0258 03/23/21 0848 03/23/21 1356 03/24/21 0222 03/25/21 0655 03/26/21 0419 03/27/21 0453  NA 137  --   --  138 136 136 135  K 4.2   < > 4.1 4.2 4.4 5.1 4.6  CL 103  --   --  104 103 100 99  CO2 21*  --   --  23 27 27 31   GLUCOSE 96  --   --  156* 123* 140* 115*  BUN 24*  --   --  22 21 21 17   CREATININE 1.11  --   --  1.18 1.06 1.05 1.10  CALCIUM 9.6  --   --  9.6 9.6 9.5 9.1  MG 2.0  --   --   --  2.0 2.0 1.9  PHOS 3.7  --   --   --  3.3 3.4 2.8   < > = values in this interval not displayed.   GFR:  Estimated Creatinine Clearance: 50.2 mL/min (by C-G formula based on SCr of 1.1 mg/dL). Liver Function Tests: Recent Labs  Lab 03/23/21 0258  AST 18  ALT 14  ALKPHOS 55  BILITOT 1.0  PROT 8.0  ALBUMIN 2.7*   No results for input(s): LIPASE, AMYLASE in the last 168 hours. No results for input(s): AMMONIA in the last 168 hours. Coagulation Profile: Recent Labs  Lab 03/21/21 2230  INR 1.1   Cardiac Enzymes: No results for input(s): CKTOTAL, CKMB, CKMBINDEX, TROPONINI in the last 168 hours. BNP (last 3 results) No results for input(s): PROBNP in the last 8760 hours. HbA1C: No results for input(s): HGBA1C in the last 72 hours. CBG: Recent Labs  Lab 03/27/21 1713 03/27/21 2045 03/27/21 2056 03/28/21 0748 03/28/21 1156  GLUCAP 138* 211* 225* 133* 242*   Lipid Profile: No results for input(s): CHOL, HDL, LDLCALC, TRIG, CHOLHDL, LDLDIRECT in the last 72 hours. Thyroid Function Tests: No results for input(s): TSH, T4TOTAL, FREET4, T3FREE, THYROIDAB in the last 72 hours. Anemia Panel: No results for input(s): VITAMINB12, FOLATE, FERRITIN, TIBC, IRON, RETICCTPCT in the last 72 hours.    Radiology Studies: I have reviewed all of the imaging during this hospital visit personally     Scheduled Meds: . aspirin EC  81 mg Oral Daily  . atorvastatin  20  mg Oral Daily  . carvedilol  3.125 mg Oral BID WC  . Chlorhexidine Gluconate Cloth  6 each Topical Daily  . enoxaparin (LOVENOX) injection  40 mg Subcutaneous Q24H  . feeding supplement  237 mL Oral BID BM  . finasteride  5 mg Oral Daily  . furosemide  40 mg Oral Daily  . insulin aspart  0-6 Units Subcutaneous TID WC  . levothyroxine  100 mcg Oral QAC breakfast  . losartan  25 mg Oral q12n4p  . multivitamin with minerals  1 tablet Oral Daily  . polyethylene glycol  17 g Oral BID  . sodium chloride flush  10-40 mL Intracatheter Q12H   Continuous Infusions: . piperacillin-tazobactam (ZOSYN)  IV 3.375 g (03/28/21 0441)  . sodium chloride irrigation       LOS: 6 days        Lillyan Hitson Gerome Apley, MD

## 2021-03-28 NOTE — NC FL2 (Signed)
[place] LEVEL OF CARE SCREENING TOOL     IDENTIFICATION  Patient Name: Brad Harrell Birthdate: 28-Dec-1945 Sex: male Admission Date (Current Location): 03/21/2021  [facility] and [place] Number:  Herbalist and Address:  [facility],  [place], [place]      Provider Number: [id number]  Attending Physician Name and Address:  Tawni Millers,*  Relative Name and Phone Number:  Amauris Debois (FWY)[phone]    Current Level of Care: Hospital Recommended Level of Care: [place] Prior Approval Number:    Date Approved/Denied:   PASRR Number: [id number]  Discharge Plan: SNF    Current Diagnoses: Patient Active Problem List   Diagnosis Date Noted  . Malnutrition of moderate degree 03/24/2021  . Scrotal abscess 03/22/2021  . Renal insufficiency 03/22/2021  . Hyperkalemia 03/22/2021  . Chronic combined systolic and diastolic CHF (congestive heart failure) ([place]) 03/22/2021  . Hypothyroidism 03/22/2021  . Hypothermia 08/21/2016  . Metabolic acidosis 67/20/9470  . Elevated lactic acid level 08/21/2016  . Combined systolic and diastolic congestive heart failure ([place])   . Diabetes mellitus, new onset ([place]) 07/01/2014  . Left arm weakness 07/01/2014  . History of CVA  07/01/2014  . Urinary tract infection 07/01/2014  . Thrombocytopenia ([place]) 07/01/2014  . DKA (diabetic ketoacidoses) 06/30/2014  . Physical deconditioning 03/13/2013  . Anemia, iron deficiency 03/08/2013  . Bacteremia 03/06/2013  . History of Liver abscess 03/05/2013  . Encephalopathy, metabolic 96/28/3662  . Anemia 03/05/2013  . Complicated UTI (urinary tract infection) 08/15/2012  . SIRS (systemic inflammatory response syndrome) ([place]) 08/15/2012  . ARF (acute renal failure) ([place]) 08/15/2012  . Hypertension   . Urinary retention   . History of ischemic stroke   . Hyperlipidemia   . Cardiomyopathy ([place])   . H/O cocaine abuse  ([place])     Orientation RESPIRATION BLADDER Height & Weight     Self,Time,Situation  Normal Incontinent Weight: 72.4 kg Height:  5\' 3"  (160 cm)  BEHAVIORAL SYMPTOMS/MOOD NEUROLOGICAL BOWEL NUTRITION STATUS      Continent Diet (Heart Healthy;CHO MOD)  AMBULATORY STATUS COMMUNICATION OF NEEDS Skin   Limited Assist Verbally Normal                       Personal Care Assistance Level of Assistance  Bathing,Feeding,Dressing Bathing Assistance: Limited assistance Feeding assistance: Limited assistance Dressing Assistance: Limited assistance     Functional Limitations Info  Sight,Hearing,Speech Sight Info: Adequate Hearing Info: Adequate Speech Info: Adequate    SPECIAL CARE FACTORS FREQUENCY  PT (By licensed PT),OT (By licensed OT)     PT Frequency: 5x week OT Frequency: 5x week            Contractures Contractures Info: Not present    Additional Factors Info  Psychotropic,Code Status Code Status Info:  (Full)   Psychotropic Info:  (trazadone-sleep)         Current Medications (03/28/2021):  This is the current hospital active medication list Current Facility-Administered Medications  Medication Dose Route Frequency Provider Last Rate Last Admin  . acetaminophen (TYLENOL) tablet 650 mg  650 mg Oral Q6H PRN [name], MD   650 mg at 03/28/21 9476   Or  . acetaminophen (TYLENOL) suppository 650 mg  650 mg Rectal Q6H PRN [name], MD      . aspirin EC tablet 81 mg  81 mg Oral Daily Irene Pap N, DO   81 mg at 03/28/21  2992  . atorvastatin (LIPITOR) tablet 20 mg  20 mg Oral Daily [name], MD   20 mg at 03/28/21 4268  . carvedilol (COREG) tablet 3.125 mg  3.125 mg Oral BID WC [name], MD   3.125 mg at 03/28/21 0935  . Chlorhexidine Gluconate Cloth 2 % PADS 6 each  6 each Topical Daily [name], MD   6 each at 03/27/21 [phone]  . cyclobenzaprine (FLEXERIL) tablet 10 mg  10 mg Oral TID PRN [name], MD   10 mg at 03/22/21 2235  . enoxaparin (LOVENOX)  injection 40 mg  40 mg Subcutaneous Q24H Irene Pap N, DO   40 mg at 03/27/21 2048  . feeding supplement (ENSURE ENLIVE / ENSURE PLUS) liquid 237 mL  237 mL Oral BID BM [name], MD   237 mL at 03/27/21 0939  . fentaNYL (SUBLIMAZE) injection 25-50 mcg  25-50 mcg Intravenous Q2H PRN [name], MD   50 mcg at 03/25/21 1608  . finasteride (PROSCAR) tablet 5 mg  5 mg Oral Daily [name], MD   5 mg at 03/28/21 6222  . furosemide (LASIX) tablet 40 mg  40 mg Oral Daily Minus Breeding, MD   40 mg at 03/28/21 0935  . insulin aspart (novoLOG) injection 0-6 Units  0-6 Units Subcutaneous TID WC [name], MD   3 Units at 03/26/21 1217  . levothyroxine (SYNTHROID) tablet 100 mcg  100 mcg Oral QAC breakfast [name], MD   100 mcg at 03/28/21 0935  . losartan (COZAAR) tablet 25 mg  25 mg Oral q12n4p Deboraha Sprang, MD   25 mg at 03/27/21 1730  . multivitamin with minerals tablet 1 tablet  1 tablet Oral Daily [name], MD   1 tablet at 03/28/21 0935  . ondansetron (ZOFRAN) tablet 4 mg  4 mg Oral Q6H PRN [name], MD       Or  . ondansetron [facility]) injection 4 mg  4 mg Intravenous Q6H PRN [name], MD      . oxyCODONE (Oxy IR/ROXICODONE) immediate release tablet 5 mg  5 mg Oral Q6H PRN Irene Pap N, DO   5 mg at 03/28/21 0443  . piperacillin-tazobactam (ZOSYN) IVPB 3.375 g  3.375 g Intravenous Gretta Cool, MD 12.5 mL/hr at 03/28/21 0441 3.375 g at 03/28/21 0441  . polyethylene glycol (MIRALAX / GLYCOLAX) packet 17 g  17 g Oral BID [name], MD   17 g at 03/28/21 9798  . senna-docusate (Senokot-S) tablet 1 tablet  1 tablet Oral QHS PRN [name], MD      . sodium chloride flush (NS) 0.9 % injection 10-40 mL  10-40 mL Intracatheter Q12H [name], MD   10 mL at 03/27/21 2047  . sodium chloride flush (NS) 0.9 % injection 10-40 mL  10-40 mL Intracatheter PRN [name], MD      . sodium chloride irrigation 0.9 % 3,000 mL  3,000 mL Irrigation Continuous [name], MD      .  traZODone (DESYREL) tablet 50 mg  50 mg Oral QHS PRN Kayleen Memos, DO         Discharge Medications: Please see discharge summary for a list of discharge medications.  Relevant Imaging Results:  Relevant Lab Results:   Additional Information SS#[id number] [address], [place], [place]

## 2021-03-28 NOTE — Progress Notes (Signed)
Progress Note  Patient Name: Brad Harrell Date of Encounter: 03/28/2021  Primary Cardiologist:   None   Subjective   He denies SOB or pain.  Lying comfortably in bed.   Inpatient Medications    Scheduled Meds: . aspirin EC  81 mg Oral Daily  . atorvastatin  20 mg Oral Daily  . carvedilol  3.125 mg Oral BID WC  . Chlorhexidine Gluconate Cloth  6 each Topical Daily  . enoxaparin (LOVENOX) injection  40 mg Subcutaneous Q24H  . feeding supplement  237 mL Oral BID BM  . finasteride  5 mg Oral Daily  . furosemide  40 mg Intravenous Daily  . insulin aspart  0-6 Units Subcutaneous TID WC  . levothyroxine  100 mcg Oral QAC breakfast  . losartan  25 mg Oral q12n4p  . multivitamin with minerals  1 tablet Oral Daily  . polyethylene glycol  17 g Oral BID  . sodium chloride flush  10-40 mL Intracatheter Q12H   Continuous Infusions: . piperacillin-tazobactam (ZOSYN)  IV 3.375 g (03/28/21 0441)  . sodium chloride irrigation     PRN Meds: acetaminophen **OR** acetaminophen, cyclobenzaprine, fentaNYL (SUBLIMAZE) injection, ondansetron **OR** ondansetron (ZOFRAN) IV, oxyCODONE, senna-docusate, sodium chloride flush, traZODone   Vital Signs    Vitals:   03/27/21 2102 03/27/21 2135 03/28/21 0500 03/28/21 0535  BP: (!) 89/57 (!) 90/50  105/66  Pulse: 86   81  Resp: 20   17  Temp: 97.6 F (36.4 C)   98.1 F (36.7 C)  TempSrc: Oral     SpO2: 99%   99%  Weight:   72.4 kg   Height:        Intake/Output Summary (Last 24 hours) at 03/28/2021 0743 Last data filed at 03/28/2021 0445 Gross per 24 hour  Intake 550.64 ml  Output 1600 ml  Net -1049.36 ml   Filed Weights   03/27/21 0459 03/27/21 0600 03/28/21 0500  Weight: 76.7 kg 72 kg 72.4 kg    Telemetry    No longer on tele - Personally Reviewed  ECG    NA - Personally Reviewed  Physical Exam   GEN: No acute distress.   Neck: No  JVD Cardiac: RRR,  murmurs, rubs, or gallops.  Respiratory: Clear  to auscultation  bilaterally. GI: Soft, nontender, non-distended  MS: No  edema; No deformity. Neuro:  Nonfocal  Psych: Normal affect   Labs    Chemistry Recent Labs  Lab 03/23/21 0258 03/23/21 0848 03/25/21 0655 03/26/21 0419 03/27/21 0453  NA 137   < > 136 136 135  K 4.2   < > 4.4 5.1 4.6  CL 103   < > 103 100 99  CO2 21*   < > 27 27 31   GLUCOSE 96   < > 123* 140* 115*  BUN 24*   < > 21 21 17   CREATININE 1.11   < > 1.06 1.05 1.10  CALCIUM 9.6   < > 9.6 9.5 9.1  PROT 8.0  --   --   --   --   ALBUMIN 2.7*  --   --   --   --   AST 18  --   --   --   --   ALT 14  --   --   --   --   ALKPHOS 55  --   --   --   --   BILITOT 1.0  --   --   --   --  GFRNONAA >60   < > >60 >60 >60  ANIONGAP 13   < > 6 9 5    < > = values in this interval not displayed.     Hematology Recent Labs  Lab 03/25/21 1307 03/26/21 0419 03/27/21 0453  WBC 8.8 11.3* 9.4  RBC 4.11* 4.35 3.62*  HGB 10.3* 10.8* 8.9*  HCT 34.5* 35.1* 29.6*  MCV 83.9 80.7 81.8  MCH 25.1* 24.8* 24.6*  MCHC 29.9* 30.8 30.1  RDW 17.6* 17.7* 17.8*  PLT 296 329 300    Cardiac EnzymesNo results for input(s): TROPONINI in the last 168 hours. No results for input(s): TROPIPOC in the last 168 hours.   BNPNo results for input(s): BNP, PROBNP in the last 168 hours.   DDimer No results for input(s): DDIMER in the last 168 hours.   Radiology    No results found.  Cardiac Studies   ECHO:  1. Left ventricular ejection fraction, by estimation, is 25 to 30%. Left  ventricular ejection fraction by 2D MOD biplane is 26.6 %. The left  ventricle has severely decreased function. The left ventricle demonstrates  global hypokinesis. Indeterminate  diastolic filling due to E-A fusion.  2. Right ventricular systolic function is mildly reduced. The right  ventricular size is normal. Tricuspid regurgitation signal is inadequate  for assessing PA pressure.  3. The mitral valve is grossly normal. Trivial mitral valve  regurgitation. No  evidence of mitral stenosis.  4. The aortic valve is tricuspid. Aortic valve regurgitation is not  visualized. No aortic stenosis is present.  5. There is mild dilatation of the ascending aorta, measuring 42 mm.  6. The inferior vena cava is normal in size with greater than 50%  respiratory variability, suggesting right atrial pressure of 3 mmHg.   Patient Profile     75 y.o. male Admitted w scrotal abscess ( pyocele) w orchiectomy and drainage procedure cx by anemia, acute and now resolved renal injury and hyperkalemia (rx w/ lokelma)  who is being seen 03/27/2021 for the evaluation of CHF in the setting of long standing CM EF 20 -25% ( 2015), not on Guideline directed medical therapy  prior CVA x 3  and DM at the request of *Dr Nevada Crane    Assessment & Plan    Cardiomyopathy:  Unclear etiology but not new.  Started this admission on Coreg and Cozaar.  He has not been taking meds as an out patient.   Plan out patient Lexiscan Myoview.  Treated with Lasix.   I will change to PO today.    Net negative appears to be 15 liters out.   His weight is down about 18 lbs or so.    I would not change to Entresto as BP is low and I would like to see have him come back to the office and understand more completely how he gets his meds and compliance.  He might ultimately need only once daily meds.    For questions or updates, please contact [place] Please consult [email or web address] for contact info under Cardiology/STEMI.   Signed, Minus Breeding, MD  03/28/2021, 7:43 AM

## 2021-03-29 DIAGNOSIS — N492 Inflammatory disorders of scrotum: Secondary | ICD-10-CM | POA: Diagnosis not present

## 2021-03-29 DIAGNOSIS — N179 Acute kidney failure, unspecified: Secondary | ICD-10-CM | POA: Diagnosis not present

## 2021-03-29 DIAGNOSIS — I5042 Chronic combined systolic (congestive) and diastolic (congestive) heart failure: Secondary | ICD-10-CM | POA: Diagnosis not present

## 2021-03-29 DIAGNOSIS — I5043 Acute on chronic combined systolic (congestive) and diastolic (congestive) heart failure: Secondary | ICD-10-CM | POA: Diagnosis not present

## 2021-03-29 DIAGNOSIS — E039 Hypothyroidism, unspecified: Secondary | ICD-10-CM | POA: Diagnosis not present

## 2021-03-29 DIAGNOSIS — E875 Hyperkalemia: Secondary | ICD-10-CM | POA: Diagnosis not present

## 2021-03-29 DIAGNOSIS — I428 Other cardiomyopathies: Secondary | ICD-10-CM | POA: Diagnosis not present

## 2021-03-29 LAB — SURGICAL PATHOLOGY

## 2021-03-29 LAB — BASIC METABOLIC PANEL
Anion gap: 13 (ref 5–15)
BUN: 33 mg/dL — ABNORMAL HIGH (ref 8–23)
CO2: 23 mmol/L (ref 22–32)
Calcium: 8.9 mg/dL (ref 8.9–10.3)
Chloride: 98 mmol/L (ref 98–111)
Creatinine, Ser: 1.56 mg/dL — ABNORMAL HIGH (ref 0.61–1.24)
GFR, Estimated: 45 mL/min — ABNORMAL LOW (ref >60)
Glucose, Bld: 135 mg/dL — ABNORMAL HIGH (ref 70–99)
Potassium: 4.5 mmol/L (ref 3.5–5.1)
Sodium: 134 mmol/L — ABNORMAL LOW (ref 135–145)

## 2021-03-29 LAB — CBC WITH DIFFERENTIAL/PLATELET
Abs Immature Granulocytes: 0.02 10*3/uL (ref 0.00–0.07)
Basophils Absolute: 0 10*3/uL (ref 0.0–0.1)
Basophils Relative: 1 %
Eosinophils Absolute: 0.2 10*3/uL (ref 0.0–0.5)
Eosinophils Relative: 3 %
HCT: 28.7 % — ABNORMAL LOW (ref 39.0–52.0)
Hemoglobin: 8.9 g/dL — ABNORMAL LOW (ref 13.0–17.0)
Immature Granulocytes: 0 %
Lymphocytes Relative: 42 %
Lymphs Abs: 2.3 10*3/uL (ref 0.7–4.0)
MCH: 25.1 pg — ABNORMAL LOW (ref 26.0–34.0)
MCHC: 31 g/dL (ref 30.0–36.0)
MCV: 81.1 fL (ref 80.0–100.0)
Monocytes Absolute: 0.7 10*3/uL (ref 0.1–1.0)
Monocytes Relative: 13 %
Neutro Abs: 2.3 10*3/uL (ref 1.7–7.7)
Neutrophils Relative %: 41 %
Platelets: 336 10*3/uL (ref 150–400)
RBC: 3.54 MIL/uL — ABNORMAL LOW (ref 4.22–5.81)
RDW: 17.7 % — ABNORMAL HIGH (ref 11.5–15.5)
WBC: 5.5 10*3/uL (ref 4.0–10.5)
nRBC: 0 % (ref 0.0–0.2)

## 2021-03-29 LAB — GLUCOSE, CAPILLARY
Glucose-Capillary: 130 mg/dL — ABNORMAL HIGH (ref 70–99)
Glucose-Capillary: 173 mg/dL — ABNORMAL HIGH (ref 70–99)
Glucose-Capillary: 127 mg/dL — ABNORMAL HIGH (ref 70–99)
Glucose-Capillary: 200 mg/dL — ABNORMAL HIGH (ref 70–99)

## 2021-03-29 LAB — LIPID PANEL
Cholesterol: 104 mg/dL (ref 0–200)
HDL: 24 mg/dL — ABNORMAL LOW (ref >40)
LDL Cholesterol: 63 mg/dL (ref 0–99)
Total CHOL/HDL Ratio: 4.3 RATIO
Triglycerides: 86 mg/dL (ref <150)
VLDL: 17 mg/dL (ref 0–40)

## 2021-03-29 MED ORDER — AMOXICILLIN-POT CLAVULANATE 875-125 MG PO TABS: 1.0000 | ORAL_TABLET | Freq: Two times a day (BID) | ORAL | Status: DC

## 2021-03-29 NOTE — Progress Notes (Signed)
Physical Therapy Treatment Patient Details Name: Brad Harrell MRN: [id number] DOB: May 31, 1946 Today's Date: 03/29/2021    History of Present Illness Pt is a 75 year old man admitted on 03/22/21 with scrotal abscess. Underwent orchiectomy and TURP on 03/25/21. Hospital course complicated by  AKI. PMH: DM2, HTN, hypothyroidism, BPH, CHF, CM, CVA with residual R side weakness.    PT Comments    Pt in bed in fetal position lunch tray barely touched.  General Comments: AxO x 2 following all commands but found incont urine and stool in bed and present with a flat affect.  Only spoke when questioned. Assisted OOB to [facility].  General bed mobility comments: 50% VC's on proper tech and max encouragement "I really don't want to get up".  General transfer comment: VERY declonditioned.  50% VC's on proper hand placement and 75% VC's on safety with turns.  Assisted from elevated bed to [facility] 1/4 pivot turn was difficult.  Max c/o fatigue.  General Gait Details: VERY weak with poor posture and limited activity tolerance.  Pt barely was able to amb around to the other side of the bed.  50% VC's for upright posture and 75% VC's safety with turns.  VERY deconditioned.  HIGH FALL RISK   Assisted back to bed and positioned to comfort.   Follow Up Recommendations  SNF     Equipment Recommendations  None recommended by PT    Recommendations for Other Services       Precautions / Restrictions Precautions Precautions: Fall Precaution Comments: scrotal wounds    Mobility  Bed Mobility Overal bed mobility: Needs Assistance Bed Mobility: Supine to Sit;Sit to Supine     Supine to sit: Mod assist Sit to supine: Mod assist   General bed mobility comments: 50% VC's on proper tech and max encouragement "I really don't want to get up"    Transfers Overall transfer level: Needs assistance Equipment used: Rolling walker (2 wheeled) Transfers: Sit to/from [facility] Sit to Stand: Mod  assist Stand pivot transfers: Mod assist;Max assist       General transfer comment: VERY declonditioned.  50% VC's on proper hand placement and 75% VC's on safety with turns.  Assisted from elevated bed to [facility] 1/4 pivot turn was difficult.  Max c/o fatigue.  Ambulation/Gait Ambulation/Gait assistance: Mod assist;Max assist Gait Distance (Feet): 10 Feet Assistive device: Rolling walker (2 wheeled) Gait Pattern/deviations: Step-to pattern;Trunk flexed;Shuffle;Decreased step length - left;Decreased step length - right Gait velocity: decreased   General Gait Details: VERY weak with poor posture and limited activity tolerance.  Pt barely was able to amb around to the other side of the bed.  50% VC's for upright posture and 75% VC's safety with turns.  VERY deconditioned.  HIGH FALL RISK   Stairs             Wheelchair Mobility    Modified Rankin (Stroke Patients Only)       Balance                                            Cognition Arousal/Alertness: Awake/alert Behavior During Therapy: Flat affect Overall Cognitive Status: No family/caregiver present to determine baseline cognitive functioning  General Comments: AxO x 2 following all commands but found incont urine and stool in bed and present with a flat affect.  Only spoke when questioned.      Exercises      General Comments        Pertinent Vitals/Pain Pain Assessment: Faces Faces Pain Scale: Hurts Harrell more Pain Location: peri care Pain Descriptors / Indicators: Grimacing;Guarding;Discomfort Pain Intervention(s): Monitored during session    Home Living                      Prior Function            PT Goals (current goals can now be found in the care plan section) Progress towards PT goals: Progressing toward goals    Frequency    Min 2X/week      PT Plan Current plan remains appropriate    Co-evaluation               AM-PAC PT "6 Clicks" Mobility   Outcome Measure  Help needed turning from your back to your side while in a flat bed without using bedrails?: A Lot Help needed moving from lying on your back to sitting on the side of a flat bed without using bedrails?: A Lot Help needed moving to and from a bed to a chair (including a wheelchair)?: A Lot Help needed standing up from a chair using your arms (e.g., wheelchair or bedside chair)?: A Lot Help needed to walk in hospital room?: A Lot Help needed climbing 3-5 steps with a railing? : Total 6 Click Score: 11    End of Session Equipment Utilized During Treatment: Gait belt Activity Tolerance: Patient limited by fatigue Patient left: in bed;with call bell/phone within reach;with bed alarm set Nurse Communication: Mobility status PT Visit Diagnosis: Unsteadiness on feet (R26.81)     Time: 0623-7628 PT Time Calculation (min) (ACUTE ONLY): 18 min  Charges:  $Gait Training: 8-22 mins                     Rica Koyanagi  PTA Acute  Rehabilitation Services Pager      [phone] Office      [phone]

## 2021-03-29 NOTE — Progress Notes (Signed)
Nutrition Follow-up  DOCUMENTATION CODES:   Non-severe (moderate) malnutrition in context of chronic illness  INTERVENTION:  - continue Ensure Enlive BID, each supplement provides 350 kcal and 20 grams of protein. - will decrease Magic Cup from TID to BID with meals, each supplement provides 290 kcal and 9 grams of protein. - liberalize diet from Heart Healthy/Carb Modified to Carb Modified--approved by MD.   NUTRITION DIAGNOSIS:   Moderate Malnutrition related to chronic illness as evidenced by moderate fat depletion,moderate muscle depletion,severe muscle depletion. -ongoing  GOAL:   Patient will meet greater than or equal to 90% of their needs -unmet on average  MONITOR:   PO intake,Supplement acceptance,Labs,Weight trends  ASSESSMENT:   75 yo male with a PMH of T2DM, HTN, hypothyroidism, BPH, and chronic systolic and diastolic CHF who presents with scrotal abscess.  Patient [facility] on 5/[facility]. He has been eating mainly 40-50% at meals since that time. Lunch tray from today visualized with <25% completion.  Patient laying in bed with no family or visitors present. He is noted to be a/o to self only, but was able to respond to all RD questions appropriately and hold a conversation.  He reports that he is finished with lunch meal and does not want anything more to eat at this time. He denies any abdominal pain or pressure today. No nausea today. He reports that he did not eat much of breakfast because he did not like the taste of the items he received.  Review of order indicates he has been accepting Ensure ~50% of the time offered. Patient reports drinking Ensure and that receiving supplement is "great". Reviewed the importance of consuming this supplement and patient is in agreement that it is beneficial for him.   Weight today is -16 lb compared to admission (5/26) weight. No information documented in the edema section of flow sheet.     Labs reviewed;  CBGs: 130 and 173 mg/dl, Na: 134 mmol/l, BUN: 33 mg/dl, creatinine: 1.56 mg/dl, GFR: 45 ml/min.  Medications reviewed; sliding scale novolog, 100 mcg oral synthroid/day, 1 tablet multivitamin with minerals/day, 17 g miralax BID.    Diet Order:   Diet Order            Diet heart healthy/carb modified Room service appropriate? Yes; Fluid consistency: Thin  Diet effective now                 EDUCATION NEEDS:   Education needs have been addressed  Skin:  Skin Assessment: Skin Integrity Issues: Skin Integrity Issues:: Incisions Incisions: scrotum (5/27)  Last BM:  PTA/unknown  Height:   Ht Readings from Last 1 Encounters:  03/25/21 5\' 3"  (1.6 m)    Weight:   Wt Readings from Last 1 Encounters:  03/29/21 73 kg    Estimated Nutritional Needs:  Kcal:  1700-1900 Protein:  75-90 grams Fluid:  >1.7 L      Jarome Matin, MS, RD, LDN, CNSC Inpatient Clinical Dietitian RD pager # available in [employer]  After hours/weekend pager # available in [facility]

## 2021-03-29 NOTE — Progress Notes (Signed)
PROGRESS NOTE    Brad Harrell  PPI:[id number] DOB: [birth year] DOA: 03/21/2021 PCP: Nolene Ebbs, MD    Brief Narrative:  Mr. Vasek admitted to the hospital with working diagnosis of left epididymoorchitis /pyocelecomplicated with severe sepsis (end organ failure AIKI-present on admission).  75 year old male past medical history for type 2 diabetes mellitus, hypertension, hypothyroidism, BPH,andsystolic heart failure who presented with left scrotal pain and edema, worsening symptoms despite outpatient treatment with oral ciprofloxacin. Patient reported 5 to 7 days of left testicle pain, associated with erythema and edema.On 5/20 he was started with ciprofloxacin with no major improvement. On his initial physical examination blood pressure 157/85, heart rate 7610, respiratory rate 18, 22, temperature 97.7, oxygen saturation 99%. His lungs were clear to auscultation bilaterally, heart S1-S2, present, rhythmic, soft abdomen, mild suprapubic tenderness, no lower extremity edema.  Sodium 135, potassium 6.2, chloride 99, bicarb 21, glucose 130, BUN 33, creatinine 1.70, white count 15.7, hemoglobin 10.0, hematocrit 32.5, platelets 344.  Patient was placed on IV antibiotics underwent left scrotal exploration with left orchiectomy and drainage of biopsy. Transurethral resection of the prostate. Foley catheter was placed for irrigation sp TURP.   Patient recovering well form surgical intervention. Required diuresis for decompensated heart failure.  Left scrotal drain discontinued.  Successful voiding trail, now on condom catheter.   Patient will need SNF placement.     Assessment & Plan:   Principal Problem:   Scrotal abscess Active Problems:   Urinary retention   History of ischemic stroke   Renal insufficiency   Hyperkalemia   Chronic combined systolic and diastolic CHF (congestive heart failure) (HCC)   Hypothyroidism   Malnutrition of moderate  degree     1. Severe sepsis due to left scrotal abscess (end organ failure AKI) present on admission. BPH with urinary retention.  Sp left orchiectomy and drainage. Sp TURP.   Surgical culture with growth, gram stain with gram negative rods.  Patient as completed antibiotic therapy with Zosyn.    Continue pain control with acetaminophen and oxycodone. On Flexeril 10 mg as needed for muscle spasms.   On finasteride no signs of urinary retention.   2. Acute post-operative anemia.  Cell count has been stable with hgb at 8,9 and hct at 28,7.  3. Acute on Chronic systolic heart failure/ HTN.  No further signs of hypervolemia, today with hypotension, likely due to over diuresis. Continue low dose carvedilol.  Hold on diuretics and blood pressure agents for now.   4. AKI with hyperkalemia  Renal function with worsening cr up to 1,5 from 1,1. K is 4,5 and serum bicarbonate at 23. Hold on diuresis for now, likely continue at discharge on a as needed basis.  Avoid hypotension and nephrotoxic medications, follow up renal function in am.   5. T2DM. dyslipidemiaglucose continue to be well controlled, fasting glucose this am was 135 mg/dl.   6. Moderate calorie protein malnutrition. Continue nutritional supplements. Patient continue to be very weak and deconditioned   Status is: Inpatient  Remains inpatient appropriate because:Inpatient level of care appropriate due to severity of illness   Dispo:  Patient From: Home  Planned Disposition: [place]  Medically stable for discharge: No     DVT prophylaxis: scd  Enoxaparin  Code Status:   full  Family Communication:  No family at the bedside      Nutrition Status: Nutrition Problem: Moderate Malnutrition Etiology: chronic illness Signs/Symptoms: moderate fat depletion,moderate muscle depletion,severe muscle depletion Interventions: Ensure Enlive (each supplement provides  350kcal and 20 grams of  protein),Magic cup,MVI    Consultants:   Urology   Cardiology   Procedures:     Antimicrobials:   Zosyn  Augmentin    Subjective: Patient with no nausea or vomiting, no chest pain or dyspnea, continue to be very weak and deconditioned.   Objective: Vitals:   03/29/21 0500 03/29/21 0504 03/29/21 0550 03/29/21 1300  BP:  (!) 82/41 123/67 101/67  Pulse:  81 79 91  Resp:  18  16  Temp:  (!) 97.5 F (36.4 C)  98.6 F (37 C)  TempSrc:  Oral  Oral  SpO2:  95%  95%  Weight: 73 kg     Height:        Intake/Output Summary (Last 24 hours) at 03/29/2021 1310 Last data filed at 03/29/2021 0557 Gross per 24 hour  Intake 120 ml  Output 275 ml  Net -155 ml   Filed Weights   03/27/21 0600 03/28/21 0500 03/29/21 0500  Weight: 72 kg 72.4 kg 73 kg    Examination:   General: Not in pain or dyspnea,. Deconditioned  Neurology: Awake and alert, non focal  E ENT: mild pallor, no icterus, oral mucosa moist Cardiovascular: No JVD. S1-S2 present, rhythmic, no gallops, rubs, or murmurs. No lower extremity edema. Pulmonary: positive breath sounds bilaterally, adequate air movement, no wheezing, rhonchi or rales. Gastrointestinal. Abdomen soft and non tender Skin. No rashes Musculoskeletal: no joint deformities     Data Reviewed: I have personally reviewed following labs and imaging studies  CBC: Recent Labs  Lab 03/23/21 0258 03/24/21 0222 03/25/21 0655 03/25/21 1307 03/26/21 0419 03/27/21 0453 03/29/21 0438  WBC 10.6*   < > 5.3 8.8 11.3* 9.4 5.5  NEUTROABS 7.2  --   --   --   --   --  2.3  HGB 10.0*   < > 10.0* 10.3* 10.8* 8.9* 8.9*  HCT 32.0*   < > 33.0* 34.5* 35.1* 29.6* 28.7*  MCV 79.2*   < > 81.7 83.9 80.7 81.8 81.1  PLT 428*   < > 347 296 329 300 336   < > = values in this interval not displayed.   Basic Metabolic Panel: Recent Labs  Lab 03/23/21 0258 03/23/21 0848 03/24/21 0222 03/25/21 1497 03/26/21 0419 03/27/21 0453 03/29/21 0438  NA 137  --   138 136 136 135 134*  K 4.2   < > 4.2 4.4 5.1 4.6 4.5  CL 103  --  104 103 100 99 98  CO2 21*  --  23 27 27 31 23   GLUCOSE 96  --  156* 123* 140* 115* 135*  BUN 24*  --  22 21 21 17  33*  CREATININE 1.11  --  1.18 1.06 1.05 1.10 1.56*  CALCIUM 9.6  --  9.6 9.6 9.5 9.1 8.9  MG 2.0  --   --  2.0 2.0 1.9  --   PHOS 3.7  --   --  3.3 3.4 2.8  --    < > = values in this interval not displayed.   GFR: Estimated Creatinine Clearance: 35.5 mL/min (A) (by C-G formula based on SCr of 1.56 mg/dL (H)). Liver Function Tests: Recent Labs  Lab 03/23/21 0258  AST 18  ALT 14  ALKPHOS 55  BILITOT 1.0  PROT 8.0  ALBUMIN 2.7*   No results for input(s): LIPASE, AMYLASE in the last 168 hours. No results for input(s): AMMONIA in the last 168 hours. Coagulation Profile: No results  for input(s): INR, PROTIME in the last 168 hours. Cardiac Enzymes: No results for input(s): CKTOTAL, CKMB, CKMBINDEX, TROPONINI in the last 168 hours. BNP (last 3 results) No results for input(s): PROBNP in the last 8760 hours. HbA1C: No results for input(s): HGBA1C in the last 72 hours. CBG: Recent Labs  Lab 03/28/21 1156 03/28/21 1809 03/28/21 2054 03/29/21 0736 03/29/21 1158  GLUCAP 242* 151* 216* 130* 173*   Lipid Profile: Recent Labs    03/29/21 0438  CHOL 104  HDL 24*  LDLCALC 63  TRIG 86  CHOLHDL 4.3   Thyroid Function Tests: No results for input(s): TSH, T4TOTAL, FREET4, T3FREE, THYROIDAB in the last 72 hours. Anemia Panel: No results for input(s): VITAMINB12, FOLATE, FERRITIN, TIBC, IRON, RETICCTPCT in the last 72 hours.    Radiology Studies: I have reviewed all of the imaging during this hospital visit personally     Scheduled Meds: . aspirin EC  81 mg Oral Daily  . atorvastatin  20 mg Oral Daily  . carvedilol  3.125 mg Oral BID WC  . enoxaparin (LOVENOX) injection  40 mg Subcutaneous Q24H  . feeding supplement  237 mL Oral BID BM  . finasteride  5 mg Oral Daily  . insulin aspart   0-6 Units Subcutaneous TID WC  . levothyroxine  100 mcg Oral QAC breakfast  . multivitamin with minerals  1 tablet Oral Daily  . polyethylene glycol  17 g Oral BID  . sodium chloride flush  10-40 mL Intracatheter Q12H   Continuous Infusions: . sodium chloride irrigation       LOS: 7 days        Ahnyla Mendel Gerome Apley, MD

## 2021-03-29 NOTE — TOC Progression Note (Addendum)
Transition of Care [facility]) - Progression Note    Patient Details  Name: Brad Harrell MRN: [id number] Date of Birth: 17-Jan-1946  Transition of Care [facility] CM/SW Contact  Marjan Rosman, Juliann Pulse, RN Phone Number: 03/29/2021, 10:18 AM  Clinical Narrative:dtr chose [facility] Clarene Critchley aware & can accept anticipate d/c in am. covid ordered.MD updated.  Await choice from dtr [facility]  1. 1.3 mi [place] [address] [place], [place] [address] [phone] Overall rating Above average 2. 1.6 mi [place] at [place], [place] [address] [phone] Overall rating Much below average 3. 2.1 mi [place], [place] [address] [phone] Overall rating Much below average 4. 2.5 mi [facility] at [place], [place] [address] [phone] Overall rating Average 5. 2.8 mi [facility] at the [place], [place] [address] [phone] Overall rating Above average 6. 2.8 mi [place] [address] [place], [place] [address] [phone] Overall rating Average 7. 3 mi [facility] [place], [place] [address] [phone] Overall rating Much above average 8. 3.6 [place] [address] [place], [place] [address] [phone] Overall rating Average 9. 3.6 mi [facility] [address] [place], [place] [address] [phone] Overall rating Below average 10. 3.9 mi [facility] [place], [place] [address] [phone] Overall rating Below average 11. 4.4 mi [facility] at [place], [place] [address] [phone] Overall rating Much above  average 12. 4.6 mi [facility] [address] [place], [place] [address] [phone] Overall rating Above average 13. 5.5 mi [facility] [address] [place], [place] [address] [phone] Overall rating Above average 14. 8.2 [facility] [place], [place] [address] [phone] Overall rating Above average 15. 9 mi [facility] [place] [address] [place], [place] [address] [phone] Overall rating Average 16. 9.1 mi [facility] and [place], [place] [address] [phone] Overall rating Below average 17. 9.2 mi [facility] [address] [place], [place] [address] [phone] Overall rating Much above average 18. 10.8 mi [place] at [facility] [address] [place], [place] [address] [phone] Overall rating Much above average 19. 12.6 mi [facility] [address] [place], [place] [address] [phone] Overall rating Much below average 20. 12.8 [facility] [place], [place] [address] [phone] Overall rating Much below average 21. 14.2 mi [address] [place], [place] [address] [phone] Overall rating Much below average 22. 14.4 mi [facility] at [place], [place] [address] [phone] Overall rating Below average 23. 14.8 mi [place] [facility] [place], [place] [address] [phone] Overall rating Above average 24. 14.9 [place] [address] [place], [place] [address] [phone] Overall rating Much below average 25. 16.5 mi [place] [address] [place],  [address] [phone] Overall rating Above average 26. 16.7  mi [facility] [place],  [address] [phone] Overall rating Much  above average 27. 17.9 mi [place], [place] [address] [phone] Overall rating Average 28. 46.8 [facility] [address] [place], [place] [address] [phone] Overall rating Much below average 29. 19.7 mi [facility] [address] [place], [place] [address] [phone] Overall rating Much below average 30. 20 [address] at [employer] at [facility], [place] [address] [phone] Overall rating Much above average 31. 21.1 mi [facility] [place], [place] [address] [phone] Overall rating Much below average 32. 21.6 [address] [place], [place] [address] [phone] Overall rating Below average 33. 71.2 [facility] [address] [place], [place] [address] [phone] Overall rating Below average 34. 21.8 [place], [place] [address] [phone] Overall rating Much above average 35. [address] [place], [place] [address] [phone] Overall rating Much above average 36. 22.6 mi [facility] [address] [place], [place] [address] [phone] Overall rating Average 37. 22.7 mi [facility] [place], [place] [address] [phone] Overall rating Much below average 38. 23.3 mi [facility] [address] [place], [place] [address] [phone] Overall rating Average 39. 23.5 mi [place], [place] [address] [phone] Overall rating Much below average 40. 24.1 mi [place] [address] [place], [place] [address] [phone] Overall rating Below average 41. 24.2 mi [place] [address] [place], [place] [address] [phone] Overall rating Below average 42. 24.4 [facility] [address] [place], [place] [address] [phone] Overall rating Much below average 43. 24.5 mi [facility] [place], [place] [address] [phone] Overall rating Average To explore and download     Expected Discharge Plan: [place] Barriers to Discharge: Continued Medical Work up  Expected Discharge Plan and Services Expected Discharge Plan: [place]   Discharge Planning Services: CM Consult   Living arrangements for the past 2 months: Single Family Home                                       Social Determinants of Health (SDOH) Interventions    Readmission Risk Interventions No flowsheet data found.

## 2021-03-29 NOTE — Progress Notes (Addendum)
Progress Note  Patient Name: Brad Harrell Date of Encounter: 03/29/2021  [facility] Cardiologist: New to Dr Percival Spanish   Subjective   Patient is a poor historian with limited insight. He states he is feeling fine. He denied any dizziness, chest pain, SOB, leg edema. He states he had a stroke in the past. He was not aware of his CHF condition, is agreeable with medication and follow up with cardiology.   Inpatient Medications    Scheduled Meds: . aspirin EC  81 mg Oral Daily  . atorvastatin  20 mg Oral Daily  . carvedilol  3.125 mg Oral BID WC  . enoxaparin (LOVENOX) injection  40 mg Subcutaneous Q24H  . feeding supplement  237 mL Oral BID BM  . finasteride  5 mg Oral Daily  . insulin aspart  0-6 Units Subcutaneous TID WC  . levothyroxine  100 mcg Oral QAC breakfast  . multivitamin with minerals  1 tablet Oral Daily  . polyethylene glycol  17 g Oral BID  . sodium chloride flush  10-40 mL Intracatheter Q12H   Continuous Infusions: . sodium chloride irrigation     PRN Meds: acetaminophen **OR** acetaminophen, cyclobenzaprine, fentaNYL (SUBLIMAZE) injection, ondansetron **OR** ondansetron (ZOFRAN) IV, oxyCODONE, senna-docusate, sodium chloride flush, traZODone   Vital Signs    Vitals:   03/28/21 2207 03/29/21 0500 03/29/21 0504 03/29/21 0550  BP: (!) 91/58  (!) 82/41 123/67  Pulse: 84  81 79  Resp:   18   Temp:   (!) 97.5 F (36.4 C)   TempSrc:   Oral   SpO2:   95%   Weight:  73 kg    Height:        Intake/Output Summary (Last 24 hours) at 03/29/2021 0949 Last data filed at 03/29/2021 0557 Gross per 24 hour  Intake 480 ml  Output 275 ml  Net 205 ml   Last 3 Weights 03/29/2021 03/28/2021 03/27/2021  Weight (lbs) 160 lb 15 oz 159 lb 9.8 oz 158 lb 11.7 oz  Weight (kg) 73 kg 72.4 kg 72 kg      Telemetry    No tracing available - Personally Reviewed  ECG    No new tracing today  - Personally Reviewed  Physical Exam   GEN: No acute distress.  Frail  elderly.  Neck: No JVD Cardiac: RRR, no murmurs, rubs, or gallops.  Respiratory: Clear to auscultation bilaterally. On room air.  GI: Soft, nontender, non-distended  GU: condom Foley draining cloudy yellow urine  MS: No BLE edema, SCDs in place; No deformity. Neuro:  Alert and oriented to person/place/time, right eye with cataract and ptosis, following commands appropriately    Psych: Normal affect   Labs    High Sensitivity Troponin:  No results for input(s): TROPONINIHS in the last 720 hours.    Chemistry Recent Labs  Lab 03/23/21 0258 03/23/21 0848 03/26/21 0419 03/27/21 0453 03/29/21 0438  NA 137   < > 136 135 134*  K 4.2   < > 5.1 4.6 4.5  CL 103   < > 100 99 98  CO2 21*   < > 27 31 23   GLUCOSE 96   < > 140* 115* 135*  BUN 24*   < > 21 17 33*  CREATININE 1.11   < > 1.05 1.10 1.56*  CALCIUM 9.6   < > 9.5 9.1 8.9  PROT 8.0  --   --   --   --   ALBUMIN 2.7*  --   --   --   --  AST 18  --   --   --   --   ALT 14  --   --   --   --   ALKPHOS 55  --   --   --   --   BILITOT 1.0  --   --   --   --   GFRNONAA >60   < > >60 >60 45*  ANIONGAP 13   < > 9 5 13    < > = values in this interval not displayed.     Hematology Recent Labs  Lab 03/26/21 0419 03/27/21 0453 03/29/21 0438  WBC 11.3* 9.4 5.5  RBC 4.35 3.62* 3.54*  HGB 10.8* 8.9* 8.9*  HCT 35.1* 29.6* 28.7*  MCV 80.7 81.8 81.1  MCH 24.8* 24.6* 25.1*  MCHC 30.8 30.1 31.0  RDW 17.7* 17.8* 17.7*  PLT 329 300 336    BNPNo results for input(s): BNP, PROBNP in the last 168 hours.   DDimer No results for input(s): DDIMER in the last 168 hours.   Radiology    No results found.  Cardiac Studies   Echo from 03/24/21:  1. Left ventricular ejection fraction, by estimation, is 25 to 30%. Left  ventricular ejection fraction by 2D MOD biplane is 26.6 %. The left  ventricle has severely decreased function. The left ventricle demonstrates  global hypokinesis. Indeterminate diastolic filling due to E-A fusion.   2. Right ventricular systolic function is mildly reduced. The right  ventricular size is normal. Tricuspid regurgitation signal is inadequate  for assessing PA pressure.  3. The mitral valve is grossly normal. Trivial mitral valve  regurgitation. No evidence of mitral stenosis.  4. The aortic valve is tricuspid. Aortic valve regurgitation is not  visualized. No aortic stenosis is present.  5. There is mild dilatation of the ascending aorta, measuring 42 mm.  6. The inferior vena cava is normal in size with greater than 50%  respiratory variability, suggesting right atrial pressure of 3 mmHg.    Echo from 07/01/2014:  - Left ventricle: The cavity size was normal. Wall thickness was  normal. Systolic function was moderately to severely reduced. The  estimated ejection fraction was in the range of 30% to 35%.  Diffuse hypokinesis. Doppler parameters are consistent with  abnormal left ventricular relaxation (grade 1 diastolic  dysfunction).  - Right atrium: The atrium was mildly dilated.     Patient Profile     75 y.o. male with PMH of chronic combined heart failure, HTN, HLD, type 2 DM, BPH, CVA, hypothyroidism, who is admitted for sepsis 2/2 left epididymoorchitis  with a possible abscess, s/p left scrotal exploration with left orchiectomy and drainage of pyocele, and transurethral resection of prostate by urology on 03/25/21. Pre-op Echo showed worsening EF to 25 to 30% with severely decreased LV function. Cardiology is consulted and following for CHF management.   Assessment & Plan    Acute on chronic systolic and diastolic heart failure  Cardiomyopathy of unclear etiology  - Echo from 2015 with EF 30-35% - not following cardiology or on any GDMT prior to this admission - he did reports DOE, peripheral edema, palpitations - Net - 14.8 L since admission, weight fluctuates 177 >165 >158 >160 since admission - GDMT: continue Coreg, started on  losartan/ lasix  this  admission, transitioned to PO Lasix 03/29/21, lab with AKI today, BP down to 82/41 over the past 24 hours, will hold losartan and Lasix today, no addition of MRA and Entresto at this  time - recommend outpatient ischemic evaluation when acute illness stabilize   AKI  - Cr  Baseline likely around 1, 1.7 POA, recovered after sepsis tx to 1.1, Cr 1.56 today - likely pre-renal from hypotension episodes in the past 24 hours, HOLD lasix and losartan today  - avoid nephrotoxin, trend renal index daily   HTN - historically takes Coreg 3.125mg  BID, this is continued  - hypotensive over the past 24 hours, trend BP while holding losartan and lasix   HLD  - on statin, no recent lipid panel, will add to AM labs today    Type 2 DM  - A1C 5.3%, controlled  - management per primary team   Hx of CVA - on statin and ASA   Sepsis 2/2 left epididymoorchitis with possible abscess Acute on chronic anemia BPH Hypothyroidism  -  management per primary team     Follow-up appointment has been arranged on 04/22/21 at 9:15AM with cardiology  For questions or updates, please contact [facility] Please consult [email or web address] for contact info under        Signed, Margie Billet, NP  03/29/2021, 9:49 AM    History and all data above reviewed.  Patient examined.  I agree with the findings as above.  He denies any SOB or pain.   The patient exam reveals COR: RRR,  Lungs:  Clear  ,  Abd: Positive bowel sounds, no rebound no guarding, Ext no edema  .  All available labs, radiology testing, previous records reviewed. Agree with documented assessment and plan.    Acute on chronic systolic and diastolic HF.  Appears to be over diuresed now. Hold meds as above and we can decide on med dosing before he goes home.  Looks like he is to be going to skilled nursing.  Jeneen Rinks [facility]  11:38 AM  03/29/2021

## 2021-03-30 DIAGNOSIS — E039 Hypothyroidism, unspecified: Secondary | ICD-10-CM | POA: Diagnosis not present

## 2021-03-30 DIAGNOSIS — E875 Hyperkalemia: Secondary | ICD-10-CM | POA: Diagnosis not present

## 2021-03-30 DIAGNOSIS — N492 Inflammatory disorders of scrotum: Secondary | ICD-10-CM | POA: Diagnosis not present

## 2021-03-30 DIAGNOSIS — Z8673 Personal history of transient ischemic attack (TIA), and cerebral infarction without residual deficits: Secondary | ICD-10-CM | POA: Diagnosis not present

## 2021-03-30 LAB — ANAEROBIC CULTURE W GRAM STAIN

## 2021-03-30 LAB — BASIC METABOLIC PANEL
Anion gap: 6 (ref 5–15)
BUN: 33 mg/dL — ABNORMAL HIGH (ref 8–23)
CO2: 28 mmol/L (ref 22–32)
Calcium: 9.1 mg/dL (ref 8.9–10.3)
Chloride: 100 mmol/L (ref 98–111)
Creatinine, Ser: 1.09 mg/dL (ref 0.61–1.24)
GFR, Estimated: >60 mL/min (ref >60)
Glucose, Bld: 129 mg/dL — ABNORMAL HIGH (ref 70–99)
Potassium: 4.5 mmol/L (ref 3.5–5.1)
Sodium: 134 mmol/L — ABNORMAL LOW (ref 135–145)

## 2021-03-30 LAB — SARS CORONAVIRUS 2 (TAT 6-24 HRS): SARS Coronavirus 2: NEGATIVE

## 2021-03-30 LAB — GLUCOSE, CAPILLARY
Glucose-Capillary: 125 mg/dL — ABNORMAL HIGH (ref 70–99)
Glucose-Capillary: 218 mg/dL — ABNORMAL HIGH (ref 70–99)

## 2021-03-30 MED ORDER — LOSARTAN POTASSIUM 25 MG PO TABS: 25.0000 mg | ORAL_TABLET | Freq: Every day | ORAL | 0 refills | Status: DC

## 2021-03-30 MED ORDER — FUROSEMIDE 20 MG PO TABS: 20.0000 mg | ORAL_TABLET | ORAL | Status: DC

## 2021-03-30 MED ORDER — ENSURE ENLIVE PO LIQD: 237.0000 mL | Freq: Two times a day (BID) | ORAL | 0 refills | Status: DC

## 2021-03-30 MED ORDER — OXYCODONE HCL 5 MG PO TABS: 5.0000 mg | ORAL_TABLET | Freq: Four times a day (QID) | ORAL | 0 refills | Status: DC | PRN

## 2021-03-30 MED ORDER — FUROSEMIDE 20 MG PO TABS
20.0000 mg | ORAL_TABLET | ORAL | 0 refills | Status: DC
Start: 2021-03-31 — End: 2021-09-16

## 2021-03-30 MED ORDER — LOSARTAN POTASSIUM 25 MG PO TABS
25.0000 mg | ORAL_TABLET | Freq: Every day | ORAL | Status: DC
  Administered 2021-03-30: 25 mg via ORAL
  Filled 2021-03-30: qty 1

## 2021-03-30 MED ORDER — ADULT MULTIVITAMIN W/MINERALS CH: 1.0000 | ORAL_TABLET | Freq: Every day | ORAL | 0 refills | Status: AC

## 2021-03-30 MED ORDER — POLYETHYLENE GLYCOL 3350 17 G PO PACK: 17.0000 g | PACK | Freq: Every day | ORAL | 0 refills | Status: DC | PRN

## 2021-03-30 NOTE — Progress Notes (Signed)
Bladder scanner reads 29ml. Shortly after scan pt was able to void 147ml on his own. Will continue to monitor.

## 2021-03-30 NOTE — TOC Transition Note (Signed)
Transition of Care [facility]) - CM/SW Discharge Note   Patient Details  Name: Brad Harrell MRN: [id number] Date of Birth: [birth year]  [facility] CM/SW Contact:  Dessa Phi, RN Phone Number: 03/30/2021, 11:32 AM   Clinical Narrative:d/c today to [facility]-rep Clarene Critchley accepted-going to rm#[id number],nsg call report tel#[phone]. PTAR called. No further CM needs.       Final next level of care: Skilled Nursing Facility Barriers to Discharge: No Barriers Identified   Patient Goals and CMS Choice Patient states their goals for this hospitalization and ongoing recovery are:: go to rehab CMS Medicare.gov Compare Post Acute Care list provided to:: Patient Represenative (must comment) (dtr Jaynie Crumble) Choice offered to / list presented to : Adult Children  Discharge Placement PASRR number recieved: 03/28/21            Patient chooses bed at: Other - please specify in the comment section below: ([facility]) Patient to be transferred to facility by: [place] Name of family member notified: [name] dtr [phone] Patient and family notified of of transfer: 03/30/21  Discharge Plan and Services   Discharge Planning Services: CM Consult                                 Social Determinants of Health (SDOH) Interventions     Readmission Risk Interventions No flowsheet data found.

## 2021-03-30 NOTE — Progress Notes (Signed)
Progress Note  Patient Name: Brad Harrell Date of Encounter: 03/30/2021  [facility] Cardiologist: New to Dr Percival Spanish   Subjective   Patient states he is feeling well. He denied any chest pain, SOB, leg edema. He is urinating without problems. He states he is leaving for SNF today.   Inpatient Medications    Scheduled Meds: . aspirin EC  81 mg Oral Daily  . atorvastatin  20 mg Oral Daily  . carvedilol  3.125 mg Oral BID WC  . enoxaparin (LOVENOX) injection  40 mg Subcutaneous Q24H  . feeding supplement  237 mL Oral BID BM  . finasteride  5 mg Oral Daily  . [START ON 03/31/2021] furosemide  20 mg Oral QODAY  . insulin aspart  0-6 Units Subcutaneous TID WC  . levothyroxine  100 mcg Oral QAC breakfast  . losartan  25 mg Oral Daily  . multivitamin with minerals  1 tablet Oral Daily  . polyethylene glycol  17 g Oral BID  . sodium chloride flush  10-40 mL Intracatheter Q12H   Continuous Infusions: . sodium chloride irrigation     PRN Meds: acetaminophen **OR** acetaminophen, cyclobenzaprine, ondansetron **OR** ondansetron (ZOFRAN) IV, oxyCODONE, senna-docusate, sodium chloride flush, traZODone   Vital Signs    Vitals:   03/30/21 0520 03/30/21 0700 03/30/21 0801 03/30/21 0801  BP: 127/80  125/69 125/69  Pulse: 93  97 97  Resp: 18     Temp: 98.2 F (36.8 C)     TempSrc: Oral     SpO2: 96%   94%  Weight:  71 kg    Height:        Intake/Output Summary (Last 24 hours) at 03/30/2021 1013 Last data filed at 03/30/2021 0934 Gross per 24 hour  Intake 360 ml  Output 1200 ml  Net -840 ml   Last 3 Weights 03/30/2021 03/29/2021 03/28/2021  Weight (lbs) 156 lb 8.4 oz 160 lb 15 oz 159 lb 9.8 oz  Weight (kg) 71 kg 73 kg 72.4 kg      Telemetry    No tracing available - Personally Reviewed  ECG    No new tracing today  - Personally Reviewed  Physical Exam   GEN: No acute distress.  Frail elderly.  Neck: No JVD Cardiac: RRR, no murmurs, rubs, or gallops.  Respiratory:  Clear to auscultation bilaterally. On room air.  GI: Soft, nontender, non-distended  GU: condom Foley draining cloudy yellow urine  MS: No BLE edema, SCDs in place; No deformity. Neuro:  Alert and oriented to person/place/time, right eye with cataract and ptosis, following commands appropriately    Psych: Normal affect   Labs    High Sensitivity Troponin:  No results for input(s): TROPONINIHS in the last 720 hours.    Chemistry Recent Labs  Lab 03/27/21 0453 03/29/21 0438 03/30/21 0454  NA 135 134* 134*  K 4.6 4.5 4.5  CL 99 98 100  CO2 31 23 28   GLUCOSE 115* 135* 129*  BUN 17 33* 33*  CREATININE 1.10 1.56* 1.09  CALCIUM 9.1 8.9 9.1  GFRNONAA >60 45* >60  ANIONGAP 5 13 6      Hematology Recent Labs  Lab 03/26/21 0419 03/27/21 0453 03/29/21 0438  WBC 11.3* 9.4 5.5  RBC 4.35 3.62* 3.54*  HGB 10.8* 8.9* 8.9*  HCT 35.1* 29.6* 28.7*  MCV 80.7 81.8 81.1  MCH 24.8* 24.6* 25.1*  MCHC 30.8 30.1 31.0  RDW 17.7* 17.8* 17.7*  PLT 329 300 336    BNPNo  results for input(s): BNP, PROBNP in the last 168 hours.   DDimer No results for input(s): DDIMER in the last 168 hours.   Radiology    No results found.  Cardiac Studies   Echo from 03/24/21:  1. Left ventricular ejection fraction, by estimation, is 25 to 30%. Left  ventricular ejection fraction by 2D MOD biplane is 26.6 %. The left  ventricle has severely decreased function. The left ventricle demonstrates  global hypokinesis. Indeterminate diastolic filling due to E-A fusion.  2. Right ventricular systolic function is mildly reduced. The right  ventricular size is normal. Tricuspid regurgitation signal is inadequate  for assessing PA pressure.  3. The mitral valve is grossly normal. Trivial mitral valve  regurgitation. No evidence of mitral stenosis.  4. The aortic valve is tricuspid. Aortic valve regurgitation is not  visualized. No aortic stenosis is present.  5. There is mild dilatation of the ascending  aorta, measuring 42 mm.  6. The inferior vena cava is normal in size with greater than 50%  respiratory variability, suggesting right atrial pressure of 3 mmHg.    Echo from 07/01/2014:  - Left ventricle: The cavity size was normal. Wall thickness was  normal. Systolic function was moderately to severely reduced. The  estimated ejection fraction was in the range of 30% to 35%.  Diffuse hypokinesis. Doppler parameters are consistent with  abnormal left ventricular relaxation (grade 1 diastolic  dysfunction).  - Right atrium: The atrium was mildly dilated.     Patient Profile     75 y.o. male with PMH of chronic combined heart failure, HTN, HLD, type 2 DM, BPH, CVA, hypothyroidism, who is admitted for sepsis 2/2 left epididymoorchitis  with a possible abscess, s/p left scrotal exploration with left orchiectomy and drainage of pyocele, and transurethral resection of prostate by urology on 03/25/21. Pre-op Echo showed worsening EF to 25 to 30% with severely decreased LV function. Cardiology is consulted and following for CHF management.   Assessment & Plan    Acute on chronic systolic and diastolic heart failure  Cardiomyopathy of unclear etiology  - Echo from 2015 with EF 30-35% - not following cardiology or on any GDMT prior to this admission - he did reports DOE, peripheral edema, palpitations - Net - 14.8 L since admission, weight fluctuates 177 >165 >158 >160 since admission - GDMT: continue Coreg 3.125mg  BID,  started on  losartan/ lasix  this admission, transitioned to PO Lasix 03/29/21, lab with AKI 5/31 due to hypotension/overdiuresis, renal index recovered after hold losartan and Lasix for 24 hours, will re-try losartan at 25mg  daily today, and start lasix 20mg  every other day tomorrow, no addition of MRA and Entresto at this time due to borderline BP. Repeat BMP in 2 days and re-evaluate tolerance of above medical therapy at SNF.  - recommend outpatient ischemic evaluation  when acute illness stabilize  - follow up appointment with cardiology on 04/22/21 arranged   AKI , resolved  - Cr  Baseline likely around 1, 1.7 POA, recovered after sepsis tx to 1.1, Cr 1.56 on 03/29/21, now 1.09 - renal index improved after HOLD lasix and losartan 03/29/21, will re-try losartan at 25mg  daily, and start lasix 20mg  every other day tomorrow, needs BMP in 48 hours to re-evaluated tolerance of GDMT  - monitor renal index daily   HTN - historically takes Coreg 3.125mg  BID, this is continued  - no longer hypotensive over the past 24 hours, BP normal, will re-try losartan 25mg  daily and lasix  20mg  every other day starting tomorrow   HLD  - on statin,LDL 63 from 03/29/21, fair control  Type 2 DM  - A1C 5.3%, controlled  - management per primary team   Hx of CVA - on statin and ASA   Sepsis 2/2 left epididymoorchitis with possible abscess Acute on chronic anemia BPH Hypothyroidism  -  management per primary team      Follow-up appointment has been arranged on 04/22/21 at 9:15AM with cardiology  For questions or updates, please contact [place] Please consult [email or web address] for contact info under        Signed, Margie Billet, NP  03/30/2021, 10:13 AM

## 2021-03-30 NOTE — Progress Notes (Signed)
5 Days Post-Op  Subjective: [name] is doing well s/p TURP and left orchiectomy for scrotal abscess and urinary retention.  He has no complaints but per nursing he is very incontinent and is managed with a condom cath set up. .     ROS:  ROS  Anti-infectives: Anti-infectives (From admission, onward)   Start     Dose/Rate Route Frequency Ordered Stop   03/29/21 1415  amoxicillin-clavulanate (AUGMENTIN) 875-125 MG per tablet 1 tablet  Status:  Discontinued        1 tablet Oral Every 12 hours 03/29/21 1320 03/29/21 1321   03/22/21 0800  piperacillin-tazobactam (ZOSYN) IVPB 3.375 g        3.375 g 12.5 mL/hr over 240 Minutes Intravenous Every 8 hours 03/22/21 0000 03/28/21 2359   03/22/21 0015  piperacillin-tazobactam (ZOSYN) IVPB 3.375 g        3.375 g 100 mL/hr over 30 Minutes Intravenous STAT 03/22/21 0000 03/22/21 0227      Current Facility-Administered Medications  Medication Dose Route Frequency Provider Last Rate Last Admin  . acetaminophen (TYLENOL) tablet 650 mg  650 mg Oral Q6H PRN [name], MD   650 mg at 03/28/21 1938   Or  . acetaminophen (TYLENOL) suppository 650 mg  650 mg Rectal Q6H PRN [name], MD      . aspirin EC tablet 81 mg  81 mg Oral Daily Irene Pap N, DO   81 mg at 03/29/21 1002  . atorvastatin (LIPITOR) tablet 20 mg  20 mg Oral Daily [name], MD   20 mg at 03/29/21 1002  . carvedilol (COREG) tablet 3.125 mg  3.125 mg Oral BID WC [name], MD   3.125 mg at 03/29/21 1636  . cyclobenzaprine (FLEXERIL) tablet 10 mg  10 mg Oral TID PRN [name], MD   10 mg at 03/22/21 2235  . enoxaparin (LOVENOX) injection 40 mg  40 mg Subcutaneous Q24H Irene Pap N, DO   40 mg at 03/29/21 2120  . feeding supplement (ENSURE ENLIVE / ENSURE PLUS) liquid 237 mL  237 mL Oral BID BM [name], MD   237 mL at 03/29/21 1502  . finasteride (PROSCAR) tablet 5 mg  5 mg Oral Daily [name], MD   5 mg at 03/29/21 1002  . insulin aspart (novoLOG) injection 0-6 Units   0-6 Units Subcutaneous TID WC [name], MD   1 Units at 03/29/21 1229  . levothyroxine (SYNTHROID) tablet 100 mcg  100 mcg Oral QAC breakfast [name], MD   100 mcg at 03/29/21 0802  . multivitamin with minerals tablet 1 tablet  1 tablet Oral Daily [name], MD   1 tablet at 03/29/21 1002  . ondansetron (ZOFRAN) tablet 4 mg  4 mg Oral Q6H PRN [name], MD       Or  . ondansetron [facility]) injection 4 mg  4 mg Intravenous Q6H PRN [name], MD      . oxyCODONE (Oxy IR/ROXICODONE) immediate release tablet 5 mg  5 mg Oral Q6H PRN Irene Pap N, DO   5 mg at 03/30/21 0309  . polyethylene glycol (MIRALAX / GLYCOLAX) packet 17 g  17 g Oral BID [name], MD   17 g at 03/29/21 2119  . senna-docusate (Senokot-S) tablet 1 tablet  1 tablet Oral QHS PRN [name], MD      . sodium chloride flush (NS) 0.9 % injection 10-40 mL  10-40 mL Intracatheter Q12H [name], MD   10  mL at 03/27/21 2047  . sodium chloride flush (NS) 0.9 % injection 10-40 mL  10-40 mL Intracatheter PRN [name], MD      . sodium chloride irrigation 0.9 % 3,000 mL  3,000 mL Irrigation Continuous [name], MD      . traZODone (DESYREL) tablet 50 mg  50 mg Oral QHS PRN Kayleen Memos, DO         Objective: Vital signs in last 24 hours: Temp:  [98.2 F (36.8 C)-98.6 F (37 C)] 98.2 F (36.8 C) (06/01 0520) Pulse Rate:  [88-93] 93 (06/01 0520) Resp:  [16-18] 18 (06/01 0520) BP: (101-127)/(63-80) 127/80 (06/01 0520) SpO2:  [94 %-96 %] 96 % (06/01 0520)  Intake/Output from previous day: 05/31 0701 - 06/01 0700 In: -  Out: 900 [Urine:900] Intake/Output this shift: Total I/O In: -  Out: 900 [Urine:900]   Physical Exam Vitals reviewed.  Constitutional:      Appearance: Normal appearance.  Genitourinary:    Comments: Scrotal wound is clean and intact with mild induration.   Neurological:     Mental Status: He is alert.     Lab Results:  Recent Labs    03/29/21 0438  WBC 5.5  HGB 8.9*   HCT 28.7*  PLT 336   BMET Recent Labs    03/29/21 0438 03/30/21 0454  NA 134* 134*  K 4.5 4.5  CL 98 100  CO2 23 28  GLUCOSE 135* 129*  BUN 33* 33*  CREATININE 1.56* 1.09  CALCIUM 8.9 9.1   PT/INR No results for input(s): LABPROT, INR in the last 72 hours. ABG No results for input(s): PHART, HCO3 in the last 72 hours.  Invalid input(s): PCO2, PO2  Studies/Results: No results found.   Assessment and Plan: BPH with retention.  Doing well s/p TURP.   Urine is clear but he is incontinent.  I will get a bladder scan to make sure it is not overflow.  Left scrotal abscess.  Doing well s/p left orchiectomy.          LOS: 8 days    [name] 03/30/2021 [phone] ID: Brad Harrell, male   DOB: [birth year], 75 y.o.   MRN: [id number] Patient ID: Brad Harrell, male   DOB: 1945-11-21, 75 y.o.   MRN: [id number] Patient ID: Brad Harrell, male   DOB: Jul 01, 1946, 75 y.o.   MRN: [id number]

## 2021-03-30 NOTE — Discharge Summary (Signed)
Physician Discharge Summary  Brad Harrell OVZ:[id number] DOB: [birth year] DOA: 03/21/2021  PCP: Brad Ebbs, MD  Admit date: 03/21/2021 Discharge date: 03/30/2021  Admitted From: Home  Disposition:   SNF   Recommendations for Outpatient Follow-up and new medication changes:  1. Follow up with Dr. Jeanie Harrell in 7 to 10 days.  2. Heart failure regimen with losartan, carvedilol and furosemide 3. Follow up with Cardiology on 04/22/21.  4. Discontinue dapagliflozin in the setting of epididymo orchitis  5. Follow up renal function and electrolytes in 7 days.   Home Health: na   Equipment/Devices: na    Discharge Condition: stable  CODE STATUS: full  Diet recommendation:  Heart healthy and diabetic prudent,   Brief/Interim Summary: Brad Harrell admitted to the hospital with the working diagnosis of left epididymo orchitis/pyocelecomplicated with severe sepsis (end organ failure AIKI-present on admission).  75 year old male with past medical history for type 2 diabetes mellitus, hypertension, hypothyroidism, BPH,andsystolic heart failure who presented with left scrotal pain and edema,worsening symptomsdespite outpatient treatment withoralciprofloxacin. Patient reported 5 to 7 days of left testicle pain, associated with erythema and edema.On 5/20 he was started with ciprofloxacin with no major improvement. Because persistent symptoms he came to the hospital for further evaluation. On his initial physical examination blood pressure 157/85, heart rate 76, respiratory rate 18, 22, temperature 97.7, oxygen saturation 99%. His lungswereclear to auscultation bilaterally, heart S1-S2, present, rhythmic, soft abdomen, mild suprapubic tenderness, no lower extremity edema.  Sodium 135, potassium 6.2, chloride 99, bicarb 21, glucose 130, BUN 33, creatinine 1.70, white count 15.7, hemoglobin 10.0, hematocrit 32.5, platelets 344.  Patient was placed on broad spectrum IV antibiotics and  underwent left scrotal exploration with left orchiectomy and drainage with biopsy. Transurethral resection of the prostate. Foley catheter was placed for irrigationsp TURP.  Patient recovering well form surgical intervention. Required diuresis for decompensated heart failure.  Left scrotal drain discontinued.  Successful voiding trail, now on condom catheter.   Patient will need SNF placement.  1.  Severe sepsis due to left scrotal abscess (endorgan failure acute kidney injury) present on admission.  BPH with urinary retention. Patient had a prolonged hospitalization, initially placed on broad-spectrum antibiotic therapy with Zosyn.  Placed on intravenous fluids and analgesics. His urine culture with no growth. Clopidogrel was discontinued.  On 5/27 he underwent left scrotal exploration with left orchiectomy and drainage of pyocele.  Transurethral resection of prostate. His surgical cultures have no growth. He completed antibiotic therapy with Zosyn with good toleration.  Now his drain and his Foley catheter have been removed, to follow-up with her urologist outpatient. Pain control with as needed oxycodone and acetaminophen. Continue finasteride.   2.  Acute postoperative anemia.  His hemoglobin, hematocrit has remained stable 8.9, 28.7.  3.  Acute on chronic systolic heart failure, hypertension.  Patient required diuresis for volume overload.  Further work-up with echocardiography showed ejection fraction of the left ventricle of 25 to 30%.  LV with global hypokinesis.  Mild reduction on RV systolic function.  Mild dilatation of ascending aorta 42 mm.  No significant valvular disease.  Negative fluid balance was achieved with significant improvement of his symptoms.  At discharge he will continue carvedilol and losartan.  20 mg of furosemide daily to keep negative fluid balance.  Patient will follow-up with cardiology as an outpatient.  4.  Acute kidney injury/hyperkalemia.   Patient had a close follow-up with his kidney function, he received intravenous furosemide for diuresis. At his discharge he has achieved  euvolemia.  Discharge sodium 134, potassium 4.5, chloride 108, bicarb 28, glucose 129, BUN 33, creatinine 1.0. Follow-up kidney function as an outpatient.  5.  Type 2 diabetes mellitus, dyslipidemia.  Patient received insulin sliding scale for glucose coverage monitoring, his glucose remained stable. Considering dental abscess, dapagliflozin will be discontinued.  Continue with metformin and statin therapy.   6.  Moderate calorie protein malnutrition.  Patient received nutritional supplements.  7. Hx of CVA. Continue aspirin and clopidogrel, along with statin therapy.   Discharge Diagnoses:  Principal Problem:   Scrotal abscess Active Problems:   Urinary retention   History of ischemic stroke   Renal insufficiency   Hyperkalemia   Chronic combined systolic and diastolic CHF (congestive heart failure) (HCC)   Hypothyroidism   Malnutrition of moderate degree    Discharge Instructions   Allergies as of 03/30/2021      Reactions   Rocephin [ceftriaxone] Nausea And Vomiting      Medication List    STOP taking these medications   ciprofloxacin 500 MG tablet Commonly known as: CIPRO   doxazosin 8 MG tablet Commonly known as: CARDURA   Farxiga 5 MG Tabs tablet Generic drug: dapagliflozin propanediol   oxyCODONE-acetaminophen 5-325 MG tablet Commonly known as: PERCOCET/ROXICET   tamsulosin 0.4 MG Caps capsule Commonly known as: FLOMAX     TAKE these medications   acetaminophen 325 MG tablet Commonly known as: TYLENOL Take 2 tablets (650 mg total) by mouth every 6 (six) hours as needed for mild pain (or Fever >/= 101).   aspirin EC 81 MG tablet Take 81 mg by mouth daily.   atorvastatin 20 MG tablet Commonly known as: LIPITOR TAKE ONE TABLET BY MOUTH DAILY   carvedilol 3.125 MG tablet Commonly known as: COREG Take 3.125 mg  by mouth 2 (two) times daily.   clopidogrel 75 MG tablet Commonly known as: PLAVIX Take 75 mg by mouth daily.   cyclobenzaprine 10 MG tablet Commonly known as: FLEXERIL Take 10 mg by mouth 3 (three) times daily as needed for muscle spasms.   feeding supplement Liqd Take 237 mLs by mouth 2 (two) times daily between meals.   finasteride 5 MG tablet Commonly known as: PROSCAR Take 5 mg by mouth daily.   furosemide 20 MG tablet Commonly known as: LASIX Take 1 tablet (20 mg total) by mouth every other day. Start taking on: March 31, 2021   glucose blood test strip Commonly known as: FREESTYLE LITE Use as instructed What changed:   how much to take  how to take this  when to take this   glucose monitoring kit monitoring kit 1 each by Does not apply route as needed for other. For blood sugar check What changed: when to take this   Lancets 28G Misc To check blood sugar three times daily with meals and at bedtime What changed:   how much to take  how to take this  when to take this  additional instructions   levothyroxine 100 MCG tablet Commonly known as: SYNTHROID Take 100 mcg by mouth daily before breakfast.   losartan 25 MG tablet Commonly known as: COZAAR Take 1 tablet (25 mg total) by mouth daily.   metFORMIN 500 MG tablet Commonly known as: GLUCOPHAGE Take 500 mg by mouth daily with breakfast.   multivitamin with minerals Tabs tablet Take 1 tablet by mouth daily. Start taking on: March 31, 2021   oxyCODONE 5 MG immediate release tablet Commonly known as: Oxy IR/ROXICODONE Take 1 tablet (  5 mg total) by mouth every 6 (six) hours as needed for severe pain.   polyethylene glycol 17 g packet Commonly known as: MIRALAX / GLYCOLAX Take 17 g by mouth daily as needed for moderate constipation.       Contact information for follow-up providers    [facility].   Why: Please call for a 2-3 week f/u appointment if not scheduled.  Contact  information: [address] [place]       [place], [place] Follow up on 04/22/2021.   Specialties: Cardiology, Radiology Why: at 9:15 AM for your cardiology appointment  Contact information: [address] [place] [address] [phone]            Contact information for after-discharge care    Destination    [facility] .   Service: Skilled Nursing Contact information: [place] [address] [phone]                 Allergies  Allergen Reactions  . Rocephin [Ceftriaxone] Nausea And Vomiting    Consultations:  Urology   Cardiology    Procedures/Studies: US PELVIS LIMITED (TRANSABDOMINAL ONLY)  Result Date: 03/23/2021 CLINICAL DATA:  Benign prostatic hypertrophy EXAM: LIMITED ULTRASOUND OF PELVIS TECHNIQUE: Limited transabdominal ultrasound examination of the pelvis was performed. COMPARISON:  None. FINDINGS: The central prostatic gland is enlarged intrudes into the bladder lumen. The bladder is thick walled in keeping with changes of chronic bladder outlet obstruction. There is debris noted within the bladder lumen. Foley catheter balloon is seen within the bladder lumen. Volumetric measurements of the prostate gland are significantly limited by transabdominal technique and obscuration of much of the prostate gland by overlying osseous and enteric structures. The estimated dimensions of the prostate gland on this examination are 6.5 x 3.4 x 5.0 cm resulting in an estimated prostatic volume of 57 cc. However, in review of the prior CT examination of 11/11/2017, the prostate gland is seen measuring at least 5.2 x 5.5 x 7.7 cm resulting in a bullet volume of 144 cc. IMPRESSION: Technically limited examination. See above. Prostate volume on this examination is calculated at 57 cc but is likely inaccurate. Transrectal sonography or MRI examination is recommended  for more definitive evaluation. Marked central prostatic hypertrophy with bladder wall thickening in keeping with changes of bladder outlet obstruction. Debris within the bladder lumen. Electronically Signed   By: Fidela Salisbury MD   On: 03/23/2021 23:50   ECHOCARDIOGRAM COMPLETE  Result Date: 03/24/2021    ECHOCARDIOGRAM REPORT   Patient Name:   Brad Harrell Date of Exam: 03/24/2021 Medical Rec #:  [id number]        Height:       63.0 in Accession #:    [id number]       Weight:       177.7 lb Date of Birth:  06-08-1946         BSA:          1.839 m Patient Age:    28 years         BP:           119/76 mmHg Patient Gender: M                HR:           95 bpm. Exam Location:  Inpatient Procedure: 2D Echo Indications:    congestive heart failure  History:  Patient has prior history of Echocardiogram examinations, most                 recent 07/01/2014. Risk Factors:Hypertension and Dyslipidemia.  Sonographer:    Johny Chess Referring Phys: Haakon  1. Left ventricular ejection fraction, by estimation, is 25 to 30%. Left ventricular ejection fraction by 2D MOD biplane is 26.6 %. The left ventricle has severely decreased function. The left ventricle demonstrates global hypokinesis. Indeterminate diastolic filling due to E-A fusion.  2. Right ventricular systolic function is mildly reduced. The right ventricular size is normal. Tricuspid regurgitation signal is inadequate for assessing PA pressure.  3. The mitral valve is grossly normal. Trivial mitral valve regurgitation. No evidence of mitral stenosis.  4. The aortic valve is tricuspid. Aortic valve regurgitation is not visualized. No aortic stenosis is present.  5. There is mild dilatation of the ascending aorta, measuring 42 mm.  6. The inferior vena cava is normal in size with greater than 50% respiratory variability, suggesting right atrial pressure of 3 mmHg. FINDINGS  Left Ventricle: Left ventricular ejection fraction, by  estimation, is 25 to 30%. Left ventricular ejection fraction by 2D MOD biplane is 26.6 %. The left ventricle has severely decreased function. The left ventricle demonstrates global hypokinesis. The left ventricular internal cavity size was normal in size. There is no left ventricular hypertrophy. Indeterminate diastolic filling due to E-A fusion. Right Ventricle: The right ventricular size is normal. No increase in right ventricular wall thickness. Right ventricular systolic function is mildly reduced. Tricuspid regurgitation signal is inadequate for assessing PA pressure. Left Atrium: Left atrial size was normal in size. Right Atrium: Right atrial size was normal in size. Pericardium: Trivial pericardial effusion is present. Presence of pericardial fat pad. Mitral Valve: The mitral valve is grossly normal. Trivial mitral valve regurgitation. No evidence of mitral valve stenosis. Tricuspid Valve: The tricuspid valve is grossly normal. Tricuspid valve regurgitation is not demonstrated. No evidence of tricuspid stenosis. Aortic Valve: The aortic valve is tricuspid. Aortic valve regurgitation is not visualized. No aortic stenosis is present. Aortic valve mean gradient measures 1.9 mmHg. Aortic valve peak gradient measures 2.8 mmHg. Aortic valve area, by VTI measures 2.62 cm. Pulmonic Valve: The pulmonic valve was grossly normal. Pulmonic valve regurgitation is not visualized. No evidence of pulmonic stenosis. Aorta: The aortic root is normal in size and structure. There is mild dilatation of the ascending aorta, measuring 42 mm. Venous: The right upper pulmonary vein is normal. The inferior vena cava is normal in size with greater than 50% respiratory variability, suggesting right atrial pressure of 3 mmHg. IAS/Shunts: The atrial septum is grossly normal.  LEFT VENTRICLE PLAX 2D                        Biplane EF (MOD) LVIDd:         5.20 cm         LV Biplane EF:   Left LVIDs:         4.30 cm                           ventricular LV PW:         1.20 cm                          ejection LV IVS:        1.00 cm  fraction by LVOT diam:     2.40 cm                          2D MOD LV SV:         37                               biplane is LV SV Index:   20                               26.6 %. LVOT Area:     4.52 cm  LV Volumes (MOD) LV vol d, MOD    132.0 ml A2C: LV vol d, MOD    104.0 ml A4C: LV vol s, MOD    87.3 ml A2C: LV vol s, MOD    84.0 ml A4C: LV SV MOD A2C:   44.7 ml LV SV MOD A4C:   104.0 ml LV SV MOD BP:    31.7 ml RIGHT VENTRICLE            IVC RV S prime:     9.57 cm/s  IVC diam: 1.40 cm TAPSE (M-mode): 1.8 cm LEFT ATRIUM             Index       RIGHT ATRIUM           Index LA diam:        3.40 cm 1.85 cm/m  RA Area:     13.00 cm LA Vol (A2C):   54.6 ml 29.69 ml/m RA Volume:   26.00 ml  14.14 ml/m LA Vol (A4C):   41.9 ml 22.79 ml/m LA Biplane Vol: 47.8 ml 25.99 ml/m  AORTIC VALVE AV Area (Vmax):    3.09 cm AV Area (Vmean):   2.66 cm AV Area (VTI):     2.62 cm AV Vmax:           84.16 cm/s AV Vmean:          67.339 cm/s AV VTI:            0.140 m AV Peak Grad:      2.8 mmHg AV Mean Grad:      1.9 mmHg LVOT Vmax:         57.43 cm/s LVOT Vmean:        39.632 cm/s LVOT VTI:          0.081 m LVOT/AV VTI ratio: 0.58  AORTA Ao Root diam: 3.30 cm Ao Asc diam:  4.20 cm  SHUNTS Systemic VTI:  0.08 m Systemic Diam: 2.40 cm Eleonore Chiquito MD Electronically signed by Eleonore Chiquito MD Signature Date/Time: 03/24/2021/3:54:08 PM    Final    US SCROTUM W/DOPPLER  Result Date: 03/21/2021 CLINICAL DATA:  Left testicular swelling EXAM: SCROTAL ULTRASOUND DOPPLER ULTRASOUND OF THE TESTICLES TECHNIQUE: Complete ultrasound examination of the testicles, epididymis, and other scrotal structures was performed. Color and spectral Doppler ultrasound were also utilized to evaluate blood flow to the testicles. COMPARISON:  CT 11/11/2017 FINDINGS: Right testicle Measurements: 3.2 x 2.6 x 2.2 cm. No mass or  microlithiasis visualized. Left testicle Measurements: 3.5 x 2.0 x 2.9 cm. Testicular parenchyma appears somewhat distorted due to compression what appears to be and heterogeneous, solid/cystic paratesticular lentiform lesion or collection measuring 3.9 x 1.3 x 3.3 cm in size. A separate  more complex solid/cystic lesion is inferior to the left testicle as well measuring 2.4 x 3 x 3.1 cm in size lobular without internal color Doppler flow. Right epididymis:  Poorly visualized Left epididymis: Poorly visualized portions appear heterogeneous possibly thickened. Hydrocele: Moderate right hydrocele with low level internal echoes/debris. Areas has a more solid liver moderate Varicocele:  None visualized. Pulsed Doppler interrogation of both testes demonstrates normal low resistance arterial and venous waveforms bilaterally. IMPRESSION: Complex layering paratesticular collection versus cystic lesion around the left testicle some compression of the cystic liver parenchyma additional separate paratesticular heterogeneous mass without internal color vascularity is noted inferior to the left testicle as well. Recommend correlation clinical findings as hematoma or paratesticular abscess could have these appearances. Some questionable thickening and heterogeneity of the left epididymis could reflect epididymitis as well. Moderate right hydrocele with low-level internal echoes, can reflect chronicity versus superinfection as well. Electronically Signed   By: Lovena Le M.D.   On: 03/21/2021 20:05      Procedures: left scrotal exploration with left orchiectomy and drainage of biopsy. Transurethral resection of the prostate. Foley catheter was placed for irrigationsp TURP.  Subjective: Patient is feeling better, no nausea or vomiting, no chest pain or dyspnea, scrotal pain is controlled.   Discharge Exam: Vitals:   03/30/21 0801 03/30/21 0801  BP: 125/69 125/69  Pulse: 97 97  Resp:    Temp:    SpO2:  94%    Vitals:   03/30/21 0520 03/30/21 0700 03/30/21 0801 03/30/21 0801  BP: 127/80  125/69 125/69  Pulse: 93  97 97  Resp: 18     Temp: 98.2 F (36.8 C)     TempSrc: Brad     SpO2: 96%   94%  Weight:  71 kg    Height:        General: Not in pain or dyspnea.  Neurology: Awake and alert, non focal  E ENT: mild pallor, no icterus, Brad mucosa moist Cardiovascular: No JVD. S1-S2 present, rhythmic, no gallops, rubs, or murmurs. No lower extremity edema. Pulmonary: positive breath sounds bilaterally, adequate air movement, no wheezing, rhonchi or rales. Gastrointestinal. Abdomen soft and non tender Skin. No rashes Musculoskeletal: no joint deformities   The results of significant diagnostics from this hospitalization (including imaging, microbiology, ancillary and laboratory) are listed below for reference.     Microbiology: Recent Results (from the past 240 hour(s))  Resp Panel by RT-PCR (Flu A&B, Covid) Nasopharyngeal Swab     Status: None   Collection Time: 03/21/21  8:40 PM   Specimen: Nasopharyngeal Swab; Nasopharyngeal(NP) swabs in vial transport medium  Result Value Ref Range Status   SARS Coronavirus 2 by RT PCR NEGATIVE NEGATIVE Final    Comment: (NOTE) SARS-CoV-2 target nucleic acids are NOT DETECTED.  The SARS-CoV-2 RNA is generally detectable in upper respiratory specimens during the acute phase of infection. The lowest concentration of SARS-CoV-2 viral copies this assay can detect is 138 copies/mL. A negative result does not preclude SARS-Cov-2 infection and should not be used as the sole basis for treatment or other patient management decisions. A negative result may occur with  improper specimen collection/handling, submission of specimen other than nasopharyngeal swab, presence of viral mutation(s) within the areas targeted by this assay, and inadequate number of viral copies(<138 copies/mL). A negative result must be combined with clinical observations, patient  history, and epidemiological information. The expected result is Negative.  Fact Sheet for Patients:  EntrepreneurPulse.com.au  Fact Sheet for Healthcare Providers:  IncredibleEmployment.be  This test is no t yet approved or cleared by the [place] and  has been authorized for detection and/or diagnosis of SARS-CoV-2 by FDA under an Emergency Use Authorization (EUA). This EUA will remain  in effect (meaning this test can be used) for the duration of the COVID-19 declaration under Section 564(b)(1) of the Act, 21 U.S.C.section 360bbb-3(b)(1), unless the authorization is terminated  or revoked sooner.       Influenza A by PCR NEGATIVE NEGATIVE Final   Influenza B by PCR NEGATIVE NEGATIVE Final    Comment: (NOTE) The Xpert Xpress SARS-CoV-2/FLU/RSV plus assay is intended as an aid in the diagnosis of influenza from Nasopharyngeal swab specimens and should not be used as a sole basis for treatment. Nasal washings and aspirates are unacceptable for Xpert Xpress SARS-CoV-2/FLU/RSV testing.  Fact Sheet for Patients: EntrepreneurPulse.com.au  Fact Sheet for Healthcare Providers: IncredibleEmployment.be  This test is not yet approved or cleared by the [place] FDA and has been authorized for detection and/or diagnosis of SARS-CoV-2 by FDA under an Emergency Use Authorization (EUA). This EUA will remain in effect (meaning this test can be used) for the duration of the COVID-19 declaration under Section 564(b)(1) of the Act, 21 U.S.C. section 360bbb-3(b)(1), unless the authorization is terminated or revoked.  Performed at [facility], [place] [address] [place], [place] [address]   Urine culture     Status: None   Collection Time: 03/21/21 10:43 PM   Specimen: Urine, Random  Result Value Ref Range Status   Specimen Description URINE, RANDOM  Final   Special Requests NONE  Final   Culture    Final    NO GROWTH Performed at [facility], [place] [address] [place], [place] [address]    Report Status 03/23/2021 FINAL  Final  Aerobic Culture w Gram Stain (superficial specimen)     Status: None   Collection Time: 03/25/21  9:49 AM   Specimen: Abscess  Result Value Ref Range Status   Specimen Description   Final    ABSCESS SCROTAL Performed at [place] [address] [place], [place] [address]    Special Requests   Final    NONE Performed at [facility], [place] [address] [place], [place] [address]    Gram Stain   Final    FEW WBC PRESENT, PREDOMINANTLY PMN FEW GRAM NEGATIVE RODS    Culture   Final    NO GROWTH 2 DAYS Performed at [facility], [place] [address], [place], [place] [address]    Report Status 03/27/2021 FINAL  Final  Anaerobic culture w Gram Stain     Status: None (Preliminary result)   Collection Time: 03/25/21  9:49 AM   Specimen: Abscess  Result Value Ref Range Status   Specimen Description   Final    ABSCESS SCROTAL Performed at [place] [address] [place], [place] [address]    Special Requests   Final    NONE Performed at [facility], [place] [address] [place], [place] [address]    Gram Stain   Final    ABUNDANT WBC PRESENT, PREDOMINANTLY PMN NO ORGANISMS SEEN Performed at [facility], [place] [address], [place], [place] [address]    Culture   Final    NO ANAEROBES ISOLATED; CULTURE IN PROGRESS FOR 5 DAYS   Report Status PENDING  Incomplete  MRSA PCR Screening  Status: None   Collection Time: 03/26/21  9:46 AM   Specimen: Nasal Mucosa; Nasopharyngeal  Result Value Ref Range Status   MRSA by PCR NEGATIVE NEGATIVE Final    Comment:        The GeneXpert MRSA Assay (FDA approved for NASAL specimens only), is one component of a comprehensive MRSA colonization surveillance program. It is not intended to diagnose MRSA infection nor to  guide or monitor treatment for MRSA infections. Performed at [facility], [place] [address] [place], [place] [address]   SARS CORONAVIRUS 2 (TAT 6-24 HRS) Nasopharyngeal Nasopharyngeal Swab     Status: None   Collection Time: 03/29/21  3:04 PM   Specimen: Nasopharyngeal Swab  Result Value Ref Range Status   SARS Coronavirus 2 NEGATIVE NEGATIVE Final    Comment: (NOTE) SARS-CoV-2 target nucleic acids are NOT DETECTED.  The SARS-CoV-2 RNA is generally detectable in upper and lower respiratory specimens during the acute phase of infection. Negative results do not preclude SARS-CoV-2 infection, do not rule out co-infections with other pathogens, and should not be used as the sole basis for treatment or other patient management decisions. Negative results must be combined with clinical observations, patient history, and epidemiological information. The expected result is Negative.  Fact Sheet for Patients: SugarRoll.be  Fact Sheet for Healthcare Providers: https://www.woods-mathews.com/  This test is not yet approved or cleared by the [place] FDA and  has been authorized for detection and/or diagnosis of SARS-CoV-2 by FDA under an Emergency Use Authorization (EUA). This EUA will remain  in effect (meaning this test can be used) for the duration of the COVID-19 declaration under Se ction 564(b)(1) of the Act, 21 U.S.C. section 360bbb-3(b)(1), unless the authorization is terminated or revoked sooner.  Performed at [facility], [place] [address] [place], [place] [address]      Labs: BNP (last 3 results) No results for input(s): BNP in the last 8760 hours. Basic Metabolic Panel: Recent Labs  Lab 03/25/21 0655 03/26/21 0419 03/27/21 0453 03/29/21 0438 03/30/21 0454  NA 136 136 135 134* 134*  K 4.4 5.1 4.6 4.5 4.5  CL 103 100 99 98 100  CO2 27 27 31 23 28   GLUCOSE 123* 140* 115* 135* 129*  BUN 21 21 17   33* 33*  CREATININE 1.06 1.05 1.10 1.56* 1.09  CALCIUM 9.6 9.5 9.1 8.9 9.1  MG 2.0 2.0 1.9  --   --   PHOS 3.3 3.4 2.8  --   --    Liver Function Tests: No results for input(s): AST, ALT, ALKPHOS, BILITOT, PROT, ALBUMIN in the last 168 hours. No results for input(s): LIPASE, AMYLASE in the last 168 hours. No results for input(s): AMMONIA in the last 168 hours. CBC: Recent Labs  Lab 03/25/21 0655 03/25/21 1307 03/26/21 0419 03/27/21 0453 03/29/21 0438  WBC 5.3 8.8 11.3* 9.4 5.5  NEUTROABS  --   --   --   --  2.3  HGB 10.0* 10.3* 10.8* 8.9* 8.9*  HCT 33.0* 34.5* 35.1* 29.6* 28.7*  MCV 81.7 83.9 80.7 81.8 81.1  PLT 347 296 329 300 336   Cardiac Enzymes: No results for input(s): CKTOTAL, CKMB, CKMBINDEX, TROPONINI in the last 168 hours. BNP: Invalid input(s): POCBNP CBG: Recent Labs  Lab 03/29/21 0736 03/29/21 1158 03/29/21 1624 03/29/21 2215 03/30/21 0733  GLUCAP 130* 173* 127* 200* 125*   D-Dimer No results for input(s): DDIMER in the last 72 hours. Hgb A1c No results for input(s): HGBA1C  in the last 72 hours. Lipid Profile Recent Labs    03/29/21 0438  CHOL 104  HDL 24*  LDLCALC 63  TRIG 86  CHOLHDL 4.3   Thyroid function studies No results for input(s): TSH, T4TOTAL, T3FREE, THYROIDAB in the last 72 hours.  Invalid input(s): FREET3 Anemia work up No results for input(s): VITAMINB12, FOLATE, FERRITIN, TIBC, IRON, RETICCTPCT in the last 72 hours. Urinalysis    Component Value Date/Time   COLORURINE AMBER (A) 03/21/2021 2243   APPEARANCEUR CLOUDY (A) 03/21/2021 2243   LABSPEC 1.022 03/21/2021 2243   PHURINE 5.0 03/21/2021 2243   GLUCOSEU >=500 (A) 03/21/2021 2243   HGBUR MODERATE (A) 03/21/2021 2243   BILIRUBINUR NEGATIVE 03/21/2021 2243   KETONESUR 20 (A) 03/21/2021 2243   PROTEINUR >=300 (A) 03/21/2021 2243   UROBILINOGEN 1.0 06/30/2014 1213   NITRITE NEGATIVE 03/21/2021 2243   LEUKOCYTESUR LARGE (A) 03/21/2021 2243   Sepsis Labs Invalid  input(s): PROCALCITONIN,  WBC,  LACTICIDVEN Microbiology Recent Results (from the past 240 hour(s))  Resp Panel by RT-PCR (Flu A&B, Covid) Nasopharyngeal Swab     Status: None   Collection Time: 03/21/21  8:40 PM   Specimen: Nasopharyngeal Swab; Nasopharyngeal(NP) swabs in vial transport medium  Result Value Ref Range Status   SARS Coronavirus 2 by RT PCR NEGATIVE NEGATIVE Final    Comment: (NOTE) SARS-CoV-2 target nucleic acids are NOT DETECTED.  The SARS-CoV-2 RNA is generally detectable in upper respiratory specimens during the acute phase of infection. The lowest concentration of SARS-CoV-2 viral copies this assay can detect is 138 copies/mL. A negative result does not preclude SARS-Cov-2 infection and should not be used as the sole basis for treatment or other patient management decisions. A negative result may occur with  improper specimen collection/handling, submission of specimen other than nasopharyngeal swab, presence of viral mutation(s) within the areas targeted by this assay, and inadequate number of viral copies(<138 copies/mL). A negative result must be combined with clinical observations, patient history, and epidemiological information. The expected result is Negative.  Fact Sheet for Patients:  EntrepreneurPulse.com.au  Fact Sheet for Healthcare Providers:  IncredibleEmployment.be  This test is no t yet approved or cleared by the [place] FDA and  has been authorized for detection and/or diagnosis of SARS-CoV-2 by FDA under an Emergency Use Authorization (EUA). This EUA will remain  in effect (meaning this test can be used) for the duration of the COVID-19 declaration under Section 564(b)(1) of the Act, 21 U.S.C.section 360bbb-3(b)(1), unless the authorization is terminated  or revoked sooner.       Influenza A by PCR NEGATIVE NEGATIVE Final   Influenza B by PCR NEGATIVE NEGATIVE Final    Comment: (NOTE) The Xpert  Xpress SARS-CoV-2/FLU/RSV plus assay is intended as an aid in the diagnosis of influenza from Nasopharyngeal swab specimens and should not be used as a sole basis for treatment. Nasal washings and aspirates are unacceptable for Xpert Xpress SARS-CoV-2/FLU/RSV testing.  Fact Sheet for Patients: EntrepreneurPulse.com.au  Fact Sheet for Healthcare Providers: IncredibleEmployment.be  This test is not yet approved or cleared by the [place] FDA and has been authorized for detection and/or diagnosis of SARS-CoV-2 by FDA under an Emergency Use Authorization (EUA). This EUA will remain in effect (meaning this test can be used) for the duration of the COVID-19 declaration under Section 564(b)(1) of the Act, 21 U.S.C. section 360bbb-3(b)(1), unless the authorization is terminated or revoked.  Performed at [facility], [place] [address], [place], [place] [address]  Urine culture     Status: None   Collection Time: 03/21/21 10:43 PM   Specimen: Urine, Random  Result Value Ref Range Status   Specimen Description URINE, RANDOM  Final   Special Requests NONE  Final   Culture   Final    NO GROWTH Performed at [facility], [address], [place], [place] [address]    Report Status 03/23/2021 FINAL  Final  Aerobic Culture w Gram Stain (superficial specimen)     Status: None   Collection Time: 03/25/21  9:49 AM   Specimen: Abscess  Result Value Ref Range Status   Specimen Description   Final    ABSCESS SCROTAL Performed at [place] [address], [place], [place] [address]    Special Requests   Final    NONE Performed at [facility], [place] [address] [place], [place] [address]    Gram Stain   Final    FEW WBC PRESENT, PREDOMINANTLY PMN FEW GRAM NEGATIVE RODS    Culture   Final    NO GROWTH 2 DAYS Performed at [facility], [place] [address], [place], [place] [address]    Report  Status 03/27/2021 FINAL  Final  Anaerobic culture w Gram Stain     Status: None (Preliminary result)   Collection Time: 03/25/21  9:49 AM   Specimen: Abscess  Result Value Ref Range Status   Specimen Description   Final    ABSCESS SCROTAL Performed at [place] [address], [place], [place] [address]    Special Requests   Final    NONE Performed at [facility], [place] [address], [place], [place] [address]    Gram Stain   Final    ABUNDANT WBC PRESENT, PREDOMINANTLY PMN NO ORGANISMS SEEN Performed at [facility], [place] [address], [place],  [address]    Culture   Final    NO ANAEROBES ISOLATED; CULTURE IN PROGRESS FOR 5 DAYS   Report Status PENDING  Incomplete  MRSA PCR Screening     Status: None   Collection Time: 03/26/21  9:46 AM   Specimen: Nasal Mucosa; Nasopharyngeal  Result Value Ref Range Status   MRSA by PCR NEGATIVE NEGATIVE Final    Comment:        The GeneXpert MRSA Assay (FDA approved for NASAL specimens only), is one component of a comprehensive MRSA colonization surveillance program. It is not intended to diagnose MRSA infection nor to guide or monitor treatment for MRSA infections. Performed at [facility], [place] [address], [place], [place] [address]   SARS CORONAVIRUS 2 (TAT 6-24 HRS) Nasopharyngeal Nasopharyngeal Swab     Status: None   Collection Time: 03/29/21  3:04 PM   Specimen: Nasopharyngeal Swab  Result Value Ref Range Status   SARS Coronavirus 2 NEGATIVE NEGATIVE Final    Comment: (NOTE) SARS-CoV-2 target nucleic acids are NOT DETECTED.  The SARS-CoV-2 RNA is generally detectable in upper and lower respiratory specimens during the acute phase of infection. Negative results do not preclude SARS-CoV-2 infection, do not rule out co-infections with other pathogens, and should not be used as the sole basis for treatment or other patient management decisions. Negative  results must be combined with clinical observations, patient history, and epidemiological information. The expected result is Negative.  Fact Sheet for Patients: SugarRoll.be  Fact Sheet for Healthcare Providers: https://www.woods-mathews.com/  This test is not yet approved or cleared by  the [employer] and  has been authorized for detection and/or diagnosis of SARS-CoV-2 by FDA under an Emergency Use Authorization (EUA). This EUA will remain  in effect (meaning this test can be used) for the duration of the COVID-19 declaration under Se ction 564(b)(1) of the Act, 21 U.S.C. section 360bbb-3(b)(1), unless the authorization is terminated or revoked sooner.  Performed at [facility], [place] [address] [place], [place] [address]      Time coordinating discharge: 45 minutes  SIGNED:   Tawni Millers, MD  [facility] 03/30/2021, 10:36 AM

## 2021-04-08 ENCOUNTER — Inpatient Hospital Stay (HOSPITAL_COMMUNITY)
Admission: EM | Admit: 2021-04-08 | Discharge: 2021-04-15 | DRG: 024 | Disposition: A | Discharge status: Skilled Nursing Facility | Payer: Medicare Other | Attending: Neurology | Admitting: Neurology

## 2021-04-08 ENCOUNTER — Emergency Department (HOSPITAL_COMMUNITY): Payer: Medicare Other

## 2021-04-08 ENCOUNTER — Emergency Department (HOSPITAL_COMMUNITY): Payer: Medicare Other | Admitting: Anesthesiology

## 2021-04-08 ENCOUNTER — Encounter: Payer: Self-pay | Admitting: Interventional Radiology

## 2021-04-08 ENCOUNTER — Encounter (HOSPITAL_COMMUNITY)
Admission: EM | Disposition: A | Discharge status: Skilled Nursing Facility | Payer: Self-pay | Source: Home / Self Care | Attending: Neurology

## 2021-04-08 DIAGNOSIS — I10 Essential (primary) hypertension: Secondary | ICD-10-CM | POA: Diagnosis not present

## 2021-04-08 DIAGNOSIS — Z9079 Acquired absence of other genital organ(s): Secondary | ICD-10-CM

## 2021-04-08 DIAGNOSIS — I110 Hypertensive heart disease with heart failure: Secondary | ICD-10-CM | POA: Diagnosis present

## 2021-04-08 DIAGNOSIS — R339 Retention of urine, unspecified: Secondary | ICD-10-CM | POA: Diagnosis present

## 2021-04-08 DIAGNOSIS — R338 Other retention of urine: Secondary | ICD-10-CM | POA: Diagnosis present

## 2021-04-08 DIAGNOSIS — I255 Ischemic cardiomyopathy: Secondary | ICD-10-CM | POA: Diagnosis not present

## 2021-04-08 DIAGNOSIS — Z7984 Long term (current) use of oral hypoglycemic drugs: Secondary | ICD-10-CM

## 2021-04-08 DIAGNOSIS — F1410 Cocaine abuse, uncomplicated: Secondary | ICD-10-CM | POA: Diagnosis present

## 2021-04-08 DIAGNOSIS — R471 Dysarthria and anarthria: Secondary | ICD-10-CM | POA: Diagnosis present

## 2021-04-08 DIAGNOSIS — Z833 Family history of diabetes mellitus: Secondary | ICD-10-CM

## 2021-04-08 DIAGNOSIS — Z79899 Other long term (current) drug therapy: Secondary | ICD-10-CM

## 2021-04-08 DIAGNOSIS — R404 Transient alteration of awareness: Secondary | ICD-10-CM | POA: Diagnosis present

## 2021-04-08 DIAGNOSIS — I429 Cardiomyopathy, unspecified: Secondary | ICD-10-CM | POA: Diagnosis present

## 2021-04-08 DIAGNOSIS — E785 Hyperlipidemia, unspecified: Secondary | ICD-10-CM | POA: Diagnosis present

## 2021-04-08 DIAGNOSIS — I5042 Chronic combined systolic (congestive) and diastolic (congestive) heart failure: Secondary | ICD-10-CM | POA: Diagnosis present

## 2021-04-08 DIAGNOSIS — Z20822 Contact with and (suspected) exposure to covid-19: Secondary | ICD-10-CM | POA: Diagnosis present

## 2021-04-08 DIAGNOSIS — I77810 Thoracic aortic ectasia: Secondary | ICD-10-CM | POA: Diagnosis present

## 2021-04-08 DIAGNOSIS — R54 Age-related physical debility: Secondary | ICD-10-CM | POA: Diagnosis present

## 2021-04-08 DIAGNOSIS — E039 Hypothyroidism, unspecified: Secondary | ICD-10-CM | POA: Diagnosis present

## 2021-04-08 DIAGNOSIS — I6602 Occlusion and stenosis of left middle cerebral artery: Secondary | ICD-10-CM | POA: Diagnosis not present

## 2021-04-08 DIAGNOSIS — I6502 Occlusion and stenosis of left vertebral artery: Secondary | ICD-10-CM | POA: Diagnosis present

## 2021-04-08 DIAGNOSIS — I6930 Unspecified sequelae of cerebral infarction: Secondary | ICD-10-CM | POA: Diagnosis not present

## 2021-04-08 DIAGNOSIS — R29810 Facial weakness: Secondary | ICD-10-CM | POA: Diagnosis present

## 2021-04-08 DIAGNOSIS — R4701 Aphasia: Secondary | ICD-10-CM | POA: Diagnosis present

## 2021-04-08 DIAGNOSIS — H5461 Unqualified visual loss, right eye, normal vision left eye: Secondary | ICD-10-CM | POA: Diagnosis present

## 2021-04-08 DIAGNOSIS — I63512 Cerebral infarction due to unspecified occlusion or stenosis of left middle cerebral artery: Secondary | ICD-10-CM | POA: Diagnosis present

## 2021-04-08 DIAGNOSIS — Z7982 Long term (current) use of aspirin: Secondary | ICD-10-CM

## 2021-04-08 DIAGNOSIS — D649 Anemia, unspecified: Secondary | ICD-10-CM | POA: Diagnosis present

## 2021-04-08 DIAGNOSIS — G8191 Hemiplegia, unspecified affecting right dominant side: Secondary | ICD-10-CM | POA: Diagnosis present

## 2021-04-08 DIAGNOSIS — K219 Gastro-esophageal reflux disease without esophagitis: Secondary | ICD-10-CM | POA: Diagnosis present

## 2021-04-08 DIAGNOSIS — R29717 NIHSS score 17: Secondary | ICD-10-CM | POA: Diagnosis present

## 2021-04-08 DIAGNOSIS — N401 Enlarged prostate with lower urinary tract symptoms: Secondary | ICD-10-CM | POA: Diagnosis present

## 2021-04-08 DIAGNOSIS — E119 Type 2 diabetes mellitus without complications: Secondary | ICD-10-CM | POA: Diagnosis present

## 2021-04-08 DIAGNOSIS — Z888 Allergy status to other drugs, medicaments and biological substances status: Secondary | ICD-10-CM

## 2021-04-08 DIAGNOSIS — Z79890 Hormone replacement therapy (postmenopausal): Secondary | ICD-10-CM

## 2021-04-08 DIAGNOSIS — I700 Atherosclerosis of aorta: Secondary | ICD-10-CM | POA: Diagnosis present

## 2021-04-08 DIAGNOSIS — I5022 Chronic systolic (congestive) heart failure: Secondary | ICD-10-CM | POA: Diagnosis not present

## 2021-04-08 DIAGNOSIS — I63132 Cerebral infarction due to embolism of left carotid artery: Secondary | ICD-10-CM | POA: Diagnosis not present

## 2021-04-08 DIAGNOSIS — Z87891 Personal history of nicotine dependence: Secondary | ICD-10-CM

## 2021-04-08 DIAGNOSIS — I639 Cerebral infarction, unspecified: Secondary | ICD-10-CM

## 2021-04-08 DIAGNOSIS — Z8249 Family history of ischemic heart disease and other diseases of the circulatory system: Secondary | ICD-10-CM

## 2021-04-08 DIAGNOSIS — Z7902 Long term (current) use of antithrombotics/antiplatelets: Secondary | ICD-10-CM

## 2021-04-08 HISTORY — PX: RADIOLOGY WITH ANESTHESIA: SHX6223

## 2021-04-08 HISTORY — PX: IR CT HEAD LTD: IMG2386

## 2021-04-08 HISTORY — PX: IR PERCUTANEOUS ART THROMBECTOMY/INFUSION INTRACRANIAL INC DIAG ANGIO: IMG6087

## 2021-04-08 LAB — RESP PANEL BY RT-PCR (FLU A&B, COVID) ARPGX2
Influenza A by PCR: NEGATIVE
Influenza B by PCR: NEGATIVE
SARS Coronavirus 2 by RT PCR: NEGATIVE

## 2021-04-08 LAB — URINALYSIS, ROUTINE W REFLEX MICROSCOPIC
Bilirubin Urine: NEGATIVE
Glucose, UA: NEGATIVE mg/dL
Ketones, ur: NEGATIVE mg/dL
Nitrite: NEGATIVE
Protein, ur: 100 mg/dL — AB
RBC / HPF: >50 RBC/hpf — ABNORMAL HIGH (ref 0–5)
Specific Gravity, Urine: 1.038 — ABNORMAL HIGH (ref 1.005–1.030)
WBC, UA: >50 WBC/hpf — ABNORMAL HIGH (ref 0–5)
pH: 6 (ref 5.0–8.0)

## 2021-04-08 LAB — DIFFERENTIAL
Abs Immature Granulocytes: 0 10*3/uL (ref 0.00–0.07)
Basophils Absolute: 0 10*3/uL (ref 0.0–0.1)
Basophils Relative: 0 %
Eosinophils Absolute: 0 10*3/uL (ref 0.0–0.5)
Eosinophils Relative: 0 %
Lymphocytes Relative: 6 %
Lymphs Abs: 0.7 10*3/uL (ref 0.7–4.0)
Monocytes Absolute: 0.1 10*3/uL (ref 0.1–1.0)
Monocytes Relative: 1 %
Neutro Abs: 11.3 10*3/uL — ABNORMAL HIGH (ref 1.7–7.7)
Neutrophils Relative %: 93 %
nRBC: 0 /100 WBC

## 2021-04-08 LAB — RAPID URINE DRUG SCREEN, HOSP PERFORMED
Amphetamines: NOT DETECTED
Barbiturates: NOT DETECTED
Benzodiazepines: NOT DETECTED
Cocaine: NOT DETECTED
Opiates: NOT DETECTED
Tetrahydrocannabinol: NOT DETECTED

## 2021-04-08 LAB — GLUCOSE, CAPILLARY
Glucose-Capillary: 128 mg/dL — ABNORMAL HIGH (ref 70–99)
Glucose-Capillary: 199 mg/dL — ABNORMAL HIGH (ref 70–99)
Glucose-Capillary: 208 mg/dL — ABNORMAL HIGH (ref 70–99)

## 2021-04-08 LAB — POCT I-STAT 7, (LYTES, BLD GAS, ICA,H+H)
Acid-Base Excess: 2 mmol/L (ref 0.0–2.0)
Bicarbonate: 25.7 mmol/L (ref 20.0–28.0)
Calcium, Ion: 1.21 mmol/L (ref 1.15–1.40)
HCT: 27 % — ABNORMAL LOW (ref 39.0–52.0)
Hemoglobin: 9.2 g/dL — ABNORMAL LOW (ref 13.0–17.0)
O2 Saturation: 100 %
Patient temperature: 35
Potassium: 4.2 mmol/L (ref 3.5–5.1)
Sodium: 140 mmol/L (ref 135–145)
TCO2: 27 mmol/L (ref 22–32)
pCO2 arterial: 32.9 mmHg (ref 32.0–48.0)
pH, Arterial: 7.493 — ABNORMAL HIGH (ref 7.350–7.450)
pO2, Arterial: 474 mmHg — ABNORMAL HIGH (ref 83.0–108.0)

## 2021-04-08 LAB — CBC
HCT: 32.4 % — ABNORMAL LOW (ref 39.0–52.0)
Hemoglobin: 10.3 g/dL — ABNORMAL LOW (ref 13.0–17.0)
MCH: 26.2 pg (ref 26.0–34.0)
MCHC: 31.8 g/dL (ref 30.0–36.0)
MCV: 82.4 fL (ref 80.0–100.0)
Platelets: 360 10*3/uL (ref 150–400)
RBC: 3.93 MIL/uL — ABNORMAL LOW (ref 4.22–5.81)
RDW: 18.1 % — ABNORMAL HIGH (ref 11.5–15.5)
WBC: 12.2 10*3/uL — ABNORMAL HIGH (ref 4.0–10.5)
nRBC: 0 % (ref 0.0–0.2)

## 2021-04-08 LAB — PROTIME-INR
INR: 1 (ref 0.8–1.2)
Prothrombin Time: 13.6 seconds (ref 11.4–15.2)

## 2021-04-08 LAB — COMPREHENSIVE METABOLIC PANEL
ALT: 10 U/L (ref 0–44)
AST: 18 U/L (ref 15–41)
Albumin: 2.6 g/dL — ABNORMAL LOW (ref 3.5–5.0)
Alkaline Phosphatase: 53 U/L (ref 38–126)
Anion gap: 7 (ref 5–15)
BUN: 17 mg/dL (ref 8–23)
CO2: 21 mmol/L — ABNORMAL LOW (ref 22–32)
Calcium: 8.8 mg/dL — ABNORMAL LOW (ref 8.9–10.3)
Chloride: 106 mmol/L (ref 98–111)
Creatinine, Ser: 0.94 mg/dL (ref 0.61–1.24)
GFR, Estimated: >60 mL/min (ref >60)
Glucose, Bld: 218 mg/dL — ABNORMAL HIGH (ref 70–99)
Potassium: 4.9 mmol/L (ref 3.5–5.1)
Sodium: 134 mmol/L — ABNORMAL LOW (ref 135–145)
Total Bilirubin: 0.6 mg/dL (ref 0.3–1.2)
Total Protein: 6.6 g/dL (ref 6.5–8.1)

## 2021-04-08 LAB — APTT: aPTT: 34 seconds (ref 24–36)

## 2021-04-08 LAB — TSH: TSH: 4.825 u[IU]/mL — ABNORMAL HIGH (ref 0.350–4.500)

## 2021-04-08 LAB — ETHANOL: Alcohol, Ethyl (B): <10 mg/dL (ref <10)

## 2021-04-08 LAB — MRSA PCR SCREENING: MRSA by PCR: NEGATIVE

## 2021-04-08 SURGERY — IR WITH ANESTHESIA
Anesthesia: General

## 2021-04-08 MED ORDER — CEFAZOLIN SODIUM-DEXTROSE 2-3 GM-%(50ML) IV SOLR
INTRAVENOUS | Status: DC | PRN
  Administered 2021-04-08: 2 g via INTRAVENOUS

## 2021-04-08 MED ORDER — PHENYLEPHRINE HCL-NACL 10-0.9 MG/250ML-% IV SOLN
INTRAVENOUS | Status: DC | PRN
  Administered 2021-04-08: 20 ug/min via INTRAVENOUS

## 2021-04-08 MED ORDER — DEXAMETHASONE SODIUM PHOSPHATE 10 MG/ML IJ SOLN
INTRAMUSCULAR | Status: DC | PRN
  Administered 2021-04-08: 5 mg via INTRAVENOUS

## 2021-04-08 MED ORDER — STROKE: EARLY STAGES OF RECOVERY BOOK
Freq: Once | Status: AC
  Filled 2021-04-08: qty 1

## 2021-04-08 MED ORDER — FINASTERIDE 5 MG PO TABS
5.0000 mg | ORAL_TABLET | Freq: Every day | ORAL | Status: DC
  Administered 2021-04-09 – 2021-04-15 (×7): 5 mg via ORAL
  Filled 2021-04-08 (×7): qty 1

## 2021-04-08 MED ORDER — LEVOTHYROXINE SODIUM 100 MCG PO TABS
100.0000 ug | ORAL_TABLET | Freq: Every day | ORAL | Status: DC
  Administered 2021-04-10 – 2021-04-15 (×6): 100 ug via ORAL
  Filled 2021-04-08 (×5): qty 1

## 2021-04-08 MED ORDER — ACETAMINOPHEN 325 MG PO TABS: 650.0000 mg | ORAL_TABLET | ORAL | Status: DC | PRN

## 2021-04-08 MED ORDER — IOHEXOL 350 MG/ML SOLN
100.0000 mL | Freq: Once | INTRAVENOUS | Status: AC | PRN
  Administered 2021-04-08: 100 mL via INTRAVENOUS

## 2021-04-08 MED ORDER — CLEVIDIPINE BUTYRATE 0.5 MG/ML IV EMUL
INTRAVENOUS | Status: DC | PRN
  Administered 2021-04-08: 1 mg/h via INTRAVENOUS

## 2021-04-08 MED ORDER — FENTANYL CITRATE (PF) 100 MCG/2ML IJ SOLN
INTRAMUSCULAR | Status: AC
  Filled 2021-04-08: qty 2

## 2021-04-08 MED ORDER — PANTOPRAZOLE SODIUM 40 MG IV SOLR
40.0000 mg | Freq: Every day | INTRAVENOUS | Status: DC
  Administered 2021-04-08 – 2021-04-09 (×2): 40 mg via INTRAVENOUS

## 2021-04-08 MED ORDER — ONDANSETRON HCL 4 MG/2ML IJ SOLN
INTRAMUSCULAR | Status: DC | PRN
  Administered 2021-04-08: 4 mg via INTRAVENOUS

## 2021-04-08 MED ORDER — FENTANYL CITRATE (PF) 250 MCG/5ML IJ SOLN
INTRAMUSCULAR | Status: DC | PRN
  Administered 2021-04-08: 100 ug via INTRAVENOUS

## 2021-04-08 MED ORDER — ACETAMINOPHEN 160 MG/5ML PO SOLN: 650.0000 mg | ORAL | Status: DC | PRN

## 2021-04-08 MED ORDER — SODIUM CHLORIDE 0.9 % IV SOLN: INTRAVENOUS | Status: DC

## 2021-04-08 MED ORDER — SODIUM CHLORIDE 0.9 % IV SOLN: INTRAVENOUS | Status: DC | PRN

## 2021-04-08 MED ORDER — CLEVIDIPINE BUTYRATE 0.5 MG/ML IV EMUL
INTRAVENOUS | Status: AC
  Filled 2021-04-08: qty 50

## 2021-04-08 MED ORDER — EPTIFIBATIDE 20 MG/10ML IV SOLN
INTRAVENOUS | Status: AC
  Filled 2021-04-08: qty 10

## 2021-04-08 MED ORDER — ROCURONIUM BROMIDE 10 MG/ML (PF) SYRINGE
PREFILLED_SYRINGE | INTRAVENOUS | Status: DC | PRN
  Administered 2021-04-08: 50 mg via INTRAVENOUS
  Administered 2021-04-08: 20 mg via INTRAVENOUS

## 2021-04-08 MED ORDER — ENOXAPARIN SODIUM 40 MG/0.4ML IJ SOSY
40.0000 mg | PREFILLED_SYRINGE | INTRAMUSCULAR | Status: DC
  Administered 2021-04-09 – 2021-04-15 (×7): 40 mg via SUBCUTANEOUS
  Filled 2021-04-08 (×7): qty 0.4

## 2021-04-08 MED ORDER — ESMOLOL HCL 100 MG/10ML IV SOLN
INTRAVENOUS | Status: DC | PRN
  Administered 2021-04-08: 20 mg via INTRAVENOUS

## 2021-04-08 MED ORDER — CLEVIDIPINE BUTYRATE 0.5 MG/ML IV EMUL
0.0000 mg/h | INTRAVENOUS | Status: AC
  Administered 2021-04-08 (×2): 21 mg/h via INTRAVENOUS
  Administered 2021-04-08 (×2): 18 mg/h via INTRAVENOUS
  Administered 2021-04-09: 10 mg/h via INTRAVENOUS
  Administered 2021-04-09 (×2): 20 mg/h via INTRAVENOUS
  Filled 2021-04-08: qty 200
  Filled 2021-04-08: qty 50
  Filled 2021-04-08 (×2): qty 200
  Filled 2021-04-08 (×2): qty 50
  Filled 2021-04-08: qty 100

## 2021-04-08 MED ORDER — ACETAMINOPHEN 160 MG/5ML PO SOLN
650.0000 mg | ORAL | Status: DC | PRN
  Filled 2021-04-08: qty 20.3

## 2021-04-08 MED ORDER — IOHEXOL 240 MG/ML SOLN
150.0000 mL | Freq: Once | INTRAMUSCULAR | Status: AC | PRN
  Administered 2021-04-08: 75 mL via INTRAVENOUS

## 2021-04-08 MED ORDER — IOHEXOL 240 MG/ML SOLN
INTRAMUSCULAR | Status: AC
  Filled 2021-04-08: qty 200

## 2021-04-08 MED ORDER — CLOPIDOGREL BISULFATE 75 MG PO TABS
75.0000 mg | ORAL_TABLET | Freq: Every day | ORAL | Status: DC
  Administered 2021-04-09 – 2021-04-15 (×7): 75 mg via ORAL
  Filled 2021-04-08 (×7): qty 1

## 2021-04-08 MED ORDER — ORAL CARE MOUTH RINSE
15.0000 mL | Freq: Two times a day (BID) | OROMUCOSAL | Status: DC
  Administered 2021-04-08 – 2021-04-15 (×12): 15 mL via OROMUCOSAL

## 2021-04-08 MED ORDER — ATORVASTATIN CALCIUM 10 MG PO TABS
20.0000 mg | ORAL_TABLET | Freq: Every day | ORAL | Status: DC
  Administered 2021-04-09 – 2021-04-15 (×7): 20 mg via ORAL
  Filled 2021-04-08 (×7): qty 2

## 2021-04-08 MED ORDER — CEFAZOLIN SODIUM-DEXTROSE 2-4 GM/100ML-% IV SOLN
INTRAVENOUS | Status: AC
  Filled 2021-04-08: qty 100

## 2021-04-08 MED ORDER — ASPIRIN EC 81 MG PO TBEC
81.0000 mg | DELAYED_RELEASE_TABLET | Freq: Every day | ORAL | Status: DC
  Administered 2021-04-09 – 2021-04-10 (×2): 81 mg via ORAL
  Filled 2021-04-08 (×2): qty 1

## 2021-04-08 MED ORDER — SUCCINYLCHOLINE CHLORIDE 20 MG/ML IJ SOLN
INTRAMUSCULAR | Status: DC | PRN
  Administered 2021-04-08: 100 mg via INTRAVENOUS

## 2021-04-08 MED ORDER — ETOMIDATE 2 MG/ML IV SOLN
INTRAVENOUS | Status: DC | PRN
  Administered 2021-04-08: 10 mg via INTRAVENOUS

## 2021-04-08 MED ORDER — SENNOSIDES-DOCUSATE SODIUM 8.6-50 MG PO TABS
1.0000 | ORAL_TABLET | Freq: Every evening | ORAL | Status: DC | PRN
  Administered 2021-04-13 – 2021-04-14 (×2): 1 via ORAL
  Filled 2021-04-08 (×3): qty 1

## 2021-04-08 MED ORDER — ACETAMINOPHEN 650 MG RE SUPP: 650.0000 mg | RECTAL | Status: DC | PRN

## 2021-04-08 MED ORDER — NITROGLYCERIN 1 MG/10 ML FOR IR/CATH LAB
INTRA_ARTERIAL | Status: AC
  Filled 2021-04-08: qty 10

## 2021-04-08 MED ORDER — HYDROCODONE-ACETAMINOPHEN 5-325 MG PO TABS
1.0000 | ORAL_TABLET | ORAL | Status: DC | PRN
Start: 2021-04-08 — End: 2021-04-15

## 2021-04-08 MED ORDER — SUGAMMADEX SODIUM 200 MG/2ML IV SOLN
INTRAVENOUS | Status: DC | PRN
  Administered 2021-04-08: 200 mg via INTRAVENOUS

## 2021-04-08 MED ORDER — CHLORHEXIDINE GLUCONATE CLOTH 2 % EX PADS
6.0000 | MEDICATED_PAD | Freq: Every day | CUTANEOUS | Status: DC
  Administered 2021-04-08 – 2021-04-15 (×7): 6 via TOPICAL

## 2021-04-08 MED ORDER — INSULIN ASPART 100 UNIT/ML IJ SOLN
0.0000 [IU] | INTRAMUSCULAR | Status: DC
  Administered 2021-04-08: 1 [IU] via SUBCUTANEOUS
  Administered 2021-04-08: 3 [IU] via SUBCUTANEOUS
  Administered 2021-04-08: 2 [IU] via SUBCUTANEOUS
  Administered 2021-04-09: 1 [IU] via SUBCUTANEOUS
  Administered 2021-04-09: 2 [IU] via SUBCUTANEOUS
  Administered 2021-04-09: 1 [IU] via SUBCUTANEOUS
  Administered 2021-04-10 – 2021-04-11 (×2): 2 [IU] via SUBCUTANEOUS
  Administered 2021-04-12 – 2021-04-13 (×4): 1 [IU] via SUBCUTANEOUS
  Administered 2021-04-13: 2 [IU] via SUBCUTANEOUS
  Administered 2021-04-13 – 2021-04-15 (×4): 1 [IU] via SUBCUTANEOUS

## 2021-04-08 NOTE — Sedation Documentation (Signed)
Right groin sheath removed. 8Fr. angioseal deployed

## 2021-04-08 NOTE — ED Notes (Signed)
Pt transferred to IR bay 2.

## 2021-04-08 NOTE — ED Notes (Signed)
Report given to IR team at bedside

## 2021-04-08 NOTE — Transfer of Care (Signed)
Immediate Anesthesia Transfer of Care Note  Patient: Brad Harrell  Procedure(s) Performed: IR WITH ANESTHESIA  Patient Location: ICU  Anesthesia Type:General  Level of Consciousness: awake, drowsy and patient cooperative  Airway & Oxygen Therapy: Patient Spontanous Breathing and Patient connected to face mask oxygen  Post-op Assessment: Report given to RN and Post -op Vital signs reviewed and stable  Post vital signs: Reviewed and stable  Last Vitals:  Vitals Value Taken Time  BP    Temp    Pulse 120 04/08/21 1338  Resp 23 04/08/21 1335  SpO2 97 % 04/08/21 1338  Vitals shown include unvalidated device data.  Last Pain:  Vitals:   04/08/21 1008  TempSrc: Rectal         Complications: No notable events documented.

## 2021-04-08 NOTE — Code Documentation (Signed)
Stroke Response Nurse Documentation Code Documentation  Brad Harrell is a 75 y.o. male arriving to [place] ED via [facility] on 04/08/2021 with past medical hx of HLP, HTN, Stroke, Diabetes, CHF, and Cocaine Abuse. Code stroke was activated by ED. Patient from [place] facility where he was LKW at last night at a nonspecific time. Pt had a orchiectomy on 03/25/2021 and discharged to [facility] on 6/1. Pt was at his baseline yesterday evening when he was walking with his walker per staff and actively having a conversation. This morning, PT came to evaluate the patient and noted he could not sit up or speak. EMS called.   Upon arrival, EDP evaluated and called a Code Stroke. Stroke team at the bedside on patient arrival. Patient to CT with team. NIHSS 17, see documentation for details and code stroke times. Patient with disoriented, not following commands, right facial droop, right arm weakness, right leg weakness, right decreased sensation, and Global aphasia  on exam. The following imaging was completed: CT, CTA head and neck, CTP. Patient is not a candidate for tPA due to being outside the window.  Per MD Quinn Axe, CTA showed M2 occlusion and IR Activated. Patient taken to [facility] 8. Attempted Lab draws, but unable to do so. Anesthesia arrived at the bedside. Pt intubated and transferred to IR Suite 1. Delay in patient care due to patient on the table in IR Suite 2.   Care/Plan: Admit to ICU, Post Interventional assessments. . Bedside handoff with IR RN Thomasene Lot  Stroke Response RN

## 2021-04-08 NOTE — Anesthesia Preprocedure Evaluation (Addendum)
Anesthesia Evaluation  Patient identified by MRN, date of birth, ID band Patient awake    Reviewed: Allergy & Precautions, H&P , NPO status , Patient's Chart, lab work & pertinent test results, reviewed documented beta blocker date and time   Airway Mallampati: II  TM Distance: >3 FB Neck ROM: Full    Dental no notable dental hx. (+) Edentulous Upper, Partial Lower, Dental Advisory Given   Pulmonary neg pulmonary ROS, Patient abstained from smoking., former smoker,    Pulmonary exam normal breath sounds clear to auscultation       Cardiovascular hypertension, Pt. on medications and Pt. on home beta blockers +CHF   Rhythm:Regular Rate:Normal     Neuro/Psych CVA, Residual Symptoms negative psych ROS   GI/Hepatic negative GI ROS, Neg liver ROS,   Endo/Other  diabetes, Type 2, Oral Hypoglycemic AgentsHypothyroidism   Renal/GU Renal disease  negative genitourinary   Musculoskeletal   Abdominal   Peds  Hematology  (+) Blood dyscrasia, anemia ,   Anesthesia Other Findings   Reproductive/Obstetrics negative OB ROS                            Anesthesia Physical Anesthesia Plan  ASA: 3 and emergent  Anesthesia Plan: General   Post-op Pain Management:    Induction: Intravenous, Rapid sequence and Cricoid pressure planned  PONV Risk Score and Plan: 2 and Ondansetron and Treatment may vary due to age or medical condition  Airway Management Planned: Oral ETT  Additional Equipment: Arterial line  Intra-op Plan:   Post-operative Plan: Possible Post-op intubation/ventilation  Informed Consent: I have reviewed the patients History and Physical, chart, labs and discussed the procedure including the risks, benefits and alternatives for the proposed anesthesia with the patient or authorized representative who has indicated his/her understanding and acceptance.     Dental advisory given  Plan  Discussed with: CRNA  Anesthesia Plan Comments:         Anesthesia Quick Evaluation

## 2021-04-08 NOTE — H&P (Addendum)
Neurology Admission H&P    Chief Complaint: aphasia, right hemiplegia  HPI: Mr. Prestage is a 75 yo male who presented via EMS to the ED for AMS. After bedding the patient in the ED, staff noted global aphasia with right sided hemiparesis and a code stroke was called. Patient was brought emergently to the CT suite.   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   NP called nurse at rehab facility to pin down LKW. Per nurse, when she checked on patient this a.m., he was asleep, so she did not wake him. Per nurse, night shift saw him well, but nurse did not know what time. PT went in to work with patient at 0830-0900, and patient could not sit up and was aphasic. 911 was called by SNF staff. Last night, his daughter saw him and she stated he was his normal self-talking and joking around. He also walked with walker yesterday.   NP spoke with patient's daughter over the phone to see what patient can normally perform. He requires some assistance at home but can ambulate independently and his calculated MRS is 2.   Patient is not a candidate for tPA due to unknown LKW. NIHSS = 17. CTH NAICP. CTA H&N showed occlusion M2 branch L MCA and corresponding 31cc area mismatch/penumbra in posterior L MCA territory with 7 cc core infarct. Dr. Ladoris Gene and Dr. Quinn Axe both discussed the risks and benefits of procedure with daughter and Austen Oyster by phone. We explained that currently patient was unable to communicate and paralyzed on the R side and that those deficits are expected to persist in absence of intervention. We explained the risks of the procedure including 10% risk ICH. She wishes to proceed.   Per daughter, patient is on Plavix/ASA due to 3 past strokes.   LKW:  unknown tPA- no, unknown LKW IR?-yes, LVO MRS-2  Past Medical History:  Diagnosis Date   Anemia    Iron Defficiency   Cardiomyopathy ([place])    Combined systolic and diastolic congestive heart failure (HCC)    Diabetes mellitus without complication ([place])    TYPE 2   Foley catheter in place    SINCE NOV 2017   H/O cocaine abuse ([place])    Hyperlipidemia    Hypertension    Hypothyroidism    Liver abscess    measuring 7 x 9 cm/notes 03/04/2013   Stroke ([place]) 2010   WEAK ON BOTH SIDES NOW TOTAL OF 3 STROKES    Urinary retention     Past Surgical History:  Procedure Laterality Date   APPENDECTOMY     COLONOSCOPY N/A 03/11/2013   Procedure: COLONOSCOPY;  Surgeon: Beryle Beams, MD;  Location: [place];  Service: Endoscopy;  Laterality: N/A;   COLONOSCOPY WITH PROPOFOL N/A 03/15/2018   Procedure: COLONOSCOPY WITH PROPOFOL;  Surgeon: Carol Ada, MD;  Location: [facility] ENDOSCOPY;  Service: Endoscopy;  Laterality: N/A;   CYSTOSCOPY WITH INSERTION OF UROLIFT N/A 01/04/2017   Procedure: CYSTOSCOPY WITH INSERTION OF UROLIFT x8;  Surgeon: Cleon Gustin, MD;  Location: [facility];  Service: Urology;  Laterality: N/A;   ESOPHAGOGASTRODUODENOSCOPY N/A 03/11/2013   Procedure: ESOPHAGOGASTRODUODENOSCOPY (EGD);  Surgeon: Beryle Beams, MD;  Location: [facility] ENDOSCOPY;  Service: Endoscopy;  Laterality: N/A;   GIVENS CAPSULE STUDY N/A 03/12/2013  Procedure: GIVENS CAPSULE STUDY;  Surgeon: Beryle Beams, MD;  Location: [facility] ENDOSCOPY;  Service: Endoscopy;  Laterality: N/A;   INGUINAL HERNIA REPAIR Right    POLYPECTOMY  03/15/2018   Procedure: POLYPECTOMY;  Surgeon: Carol Ada, MD;  Location: [facility] ENDOSCOPY;  Service: Endoscopy;;   SCROTAL EXPLORATION Left 03/25/2021   Procedure: LEFT SCROTUM EXPLORATION WITH ORCHIECTOMY;  Surgeon: [name], MD;  Location: [facility];  Service: Urology;  Laterality: Left;   TIBIA FRACTURE SURGERY Left    TRANSURETHRAL RESECTION OF PROSTATE N/A 03/25/2021   Procedure:  CYSTOSCOPY TRANSURETHRAL RESECTION OF THE PROSTATE (TURP);  Surgeon: [name], MD;  Location: [facility];  Service: Urology;  Laterality: N/A;    Family History  Problem Relation Age of Onset   Hypertension Mother    Hypertension Father    Diabetes type II Father    Hypertension Brother    Social History:  reports that he has quit smoking. His smoking use included cigarettes. He has a 51.00 pack-year smoking history. He has never used smokeless tobacco. He reports current drug use. Drug: Cocaine. He reports that he does not drink alcohol.  Allergies:  Allergies  Allergen Reactions   Rocephin [Ceftriaxone] Nausea And Vomiting    ROS: Patient unable to participate in ROS due to mental status.    Physical Examination: General: Appears acutely ill, NAD.  Psych: Calm.  HEENT-  Normocephalic, AT. No lymphadenopathy. No thyromegaly. No stiffness of neck.  Cardiovascular - RRR. No LE edema.  Lungs - Normal respiratory effort. Abdomen - soft, NT. Extremities - warm, well perfused. Skin: WDI.  NEURO:   NIHSS:  1a Level of Consciousness: 0 1b LOC Questions: 2 1c LOC Commands: 2 2 Best Gaze: 0 3 Visual: 0 4 Facial Palsy: 1 5a Motor Arm - left: 0 5b Motor Arm - Right: 4 6a Motor Leg - Left: 0 6b Motor Leg - Right: 4 7 Limb Ataxia: 0 8 Sensory: 2 9 Best Language: 2 10 Dysarthria: 0 11 Extinct and Inattention: 0 TOTAL: 17  Mental Status: Awake, not alert. Patient is unable to follow simple commands. Orientation can not be checked due to patient being globally aphasic.  Speech/Language: global aphasia.  Cranial Nerves:  II: PERRL.  III, IV, VI: No blink to threat on right.  Tone is flaccid RUE/RLE and bulk is normal Sensation- no response on the right to noxious stimuli. Coordination: no tremor.  Gait- deferred  Labs pertinent to this encounter: Labs not back, due to emergent nature of event.   Imaging: Independently reviewed by MD and Dr. Ladoris Gene.    CTH 1. No  evidence of acute large vascular territory infarct or hemorrhage by CT. ASPECTS 10. 2. Moderate to advanced chronic microvascular ischemic disease. 3. Remote inferior left cerebellar infarct. 4. 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  CTA head and neck 1. Focal short-segment opacification of a left distal M2 MCA branch with occlusion. This vessel is non-opacified more proximally in the sylvian fissure, compatible with a more proximal M2 occlusion or high-grade stenosis. The exact location of M2 occlusion/stenosis is not confidently identified. Catheter arteriogram could further characterize if clinically indicated. 2. Severe right and moderate left paraclinoid ICA narrowing due to calcific atherosclerosis. 3. 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 4. Superimposed moderate right intradural and right P2 PCA stenosis.   1. Streak artifact limits evaluation proximally with suspected age indeterminate occlusion of bilateral  vertebral artery origins with reconstitution in the mid neck. Multifocal narrowing of the remainder of the right vertebral artery and focal narrowing of the left vertebral artery at C4-C5. 2. Secretions in the trachea, placing the patient at risk for aspiration. 3. Emphysema.  CNS imaging personally reviewed by Dr. Quinn Axe and d/w radiologist and neurointerventionalist  Assessment: 75 yo male with stroke risk factors of previous strokes, HLD, HTN, tobacco abuse, and DM II. tPA was not given due to not being able to clarify his LKW. Left distal M2 MCA branch with occlusion noted on CTA and patient was taken emergently to IR after consent by daughter.   Impression: Acute stroke with left M2 MCA branch with occlusion.  Plan:  # Acute L MCA infarct 2/2 M2 occlusion - Neuro-IR for cerebral angiogram and possible  intervention - Admit to Neuro ICU post-procedure under Dr. Quinn Axe. Stroke team to assume care tomorrow. - Continue home ASA 81mg  daily + plavix 75mg  daily to start tomorrow. - BP goals per IR. Avoid oral antihypertensives. Strict avoidance of hypotension particularly given vertebrobasilar insufficiency on CTA. IVF prn to maintain SBP > 130 - MRI brain wo contrast - TTE - Check A1c and LDL + add statin per guidelines. Continue home atorvastatin 20mg  daily for now. - Neurochecks and NIHSS per protocol - STAT head CT for any change in neuro exam - Tele - PT/OT/SLP - Stroke education - Amb referral to neurology upon discharge  # DM2 - SSI - Accuchecks q 4 hrs - Hold metformin  # Chronic systolic HF - TTE pending - Hold carvedilol and losartan in the setting of acute stroke  # Hypothyroidism - Continue home synthroid 1057mcg daily  # Urinary retention 2/2 BPH - Continue home finasteride  Stroke team will continue to follow.   Note written by Ms. Lionel December NP and edited by me to reflect my findings and recommendations. I was present throughout the stroke code and made all significant decisions including consideration of intervention, which was then d/w Dr. Estanislado Pandy.   This patient is critically ill and at significant risk of neurological worsening, death and care requires constant monitoring of vital signs, hemodynamics,respiratory and cardiac monitoring, neurological assessment, discussion with family, other specialists and medical decision making of high complexity. I spent 110 minutes of neurocritical care time  in the care of  this patient. This was time spent independent of any time provided by nurse practitioner or PA.   Su Monks, MD [facility] [phone]   If 7pm- 7am, please page neurology on call as listed in [place].

## 2021-04-08 NOTE — ED Provider Notes (Signed)
[facility] EMERGENCY DEPARTMENT Provider Note   CSN: [id number] Arrival date & time: 04/08/21  0957     History Chief Complaint  Patient presents with   Altered Mental Status    Brad Harrell is a 75 y.o. male who presents with altered mental status.  I have initiated a code stroke due to aphasia and apparent right-sided weakness.  History of strokes.  Unknown last seen normal.  Patient was recently discharged and is coming from a [facility].  He was discharged 9 days ago after admission for severe sepsis secondary to epididymo orchitis with hydrocele and is status post right-sided orchiectomy.  He is currently at skilled nursing facility.  Patient was sent in today for difficulty with speech.  EMS noted apparent right-sided weakness.  Patient is unable to give any other history at this time.  The history is provided by the EMS personnel. The history is limited by a language barrier and the condition of the patient.  Altered Mental Status     Past Medical History:  Diagnosis Date   Anemia    Iron Defficiency   Cardiomyopathy ([place])    Combined systolic and diastolic congestive heart failure (HCC)    Diabetes mellitus without complication (HCC)    TYPE 2   Foley catheter in place    SINCE NOV 2017   H/O cocaine abuse ([place])    Hyperlipidemia    Hypertension    Hypothyroidism    Liver abscess    measuring 7 x 9 cm/notes 03/04/2013   Stroke ([place]) 2010   WEAK ON BOTH SIDES NOW TOTAL OF 3 STROKES    Urinary retention     Patient Active Problem List   Diagnosis Date Noted   Malnutrition of moderate degree 03/24/2021   Scrotal abscess 03/22/2021   Renal insufficiency 03/22/2021   Hyperkalemia 03/22/2021   Chronic combined systolic and diastolic CHF (congestive heart failure) ([place]) 03/22/2021   Hypothyroidism 03/22/2021   Hypothermia 32/44/0102   Metabolic acidosis 72/53/6644   Elevated lactic acid level 08/21/2016   Combined systolic and  diastolic congestive heart failure ([place])    Diabetes mellitus, new onset ([place]) 07/01/2014   Left arm weakness 07/01/2014   History of CVA  07/01/2014   Urinary tract infection 07/01/2014   Thrombocytopenia ([place]) 07/01/2014   DKA (diabetic ketoacidoses) 06/30/2014   Physical deconditioning 03/13/2013   Anemia, iron deficiency 03/08/2013   Bacteremia 03/06/2013   History of Liver abscess 03/05/2013   Encephalopathy, metabolic 03/47/4259   Anemia 56/38/7564   Complicated UTI (urinary tract infection) 08/15/2012   SIRS (systemic inflammatory response syndrome) ([place]) 08/15/2012   ARF (acute renal failure) ([place]) 08/15/2012   Hypertension    Urinary retention    History of ischemic stroke    Hyperlipidemia    Cardiomyopathy ([place])    H/O cocaine abuse ([place])     Past Surgical History:  Procedure Laterality Date   APPENDECTOMY     COLONOSCOPY N/A 03/11/2013   Procedure: COLONOSCOPY;  Surgeon: Beryle Beams, MD;  Location: [place];  Service: Endoscopy;  Laterality: N/A;   COLONOSCOPY WITH PROPOFOL N/A 03/15/2018   Procedure: COLONOSCOPY WITH PROPOFOL;  Surgeon: Carol Ada, MD;  Location: [facility];  Service: Endoscopy;  Laterality: N/A;   CYSTOSCOPY WITH INSERTION OF UROLIFT N/A 01/04/2017   Procedure: CYSTOSCOPY WITH INSERTION OF UROLIFT x8;  Surgeon: Cleon Gustin, MD;  Location: [facility];  Service: Urology;  Laterality: N/A;   ESOPHAGOGASTRODUODENOSCOPY N/A 03/11/2013  Procedure: ESOPHAGOGASTRODUODENOSCOPY (EGD);  Surgeon: Beryle Beams, MD;  [facility];  Service: Endoscopy;  Laterality: N/A;   GIVENS CAPSULE STUDY N/A 03/12/2013   Procedure: GIVENS CAPSULE STUDY;  Surgeon: Beryle Beams, MD;  Location: [facility];  Service: Endoscopy;  Laterality: N/A;   INGUINAL HERNIA REPAIR Right    POLYPECTOMY  03/15/2018   Procedure: POLYPECTOMY;  Surgeon: Carol Ada, MD;  Location: [facility];  Service: Endoscopy;;   SCROTAL EXPLORATION Left 03/25/2021   Procedure:  LEFT SCROTUM EXPLORATION WITH ORCHIECTOMY;  Surgeon: [name], MD;  Location: [facility];  Service: Urology;  Laterality: Left;   TIBIA FRACTURE SURGERY Left    TRANSURETHRAL RESECTION OF PROSTATE N/A 03/25/2021   Procedure: CYSTOSCOPY TRANSURETHRAL RESECTION OF THE PROSTATE (TURP);  Surgeon: [name], MD;  Location: [facility];  Service: Urology;  Laterality: N/A;       Family History  Problem Relation Age of Onset   Hypertension Mother    Hypertension Father    Diabetes type II Father    Hypertension Brother     Social History   Tobacco Use   Smoking status: Former    Packs/day: 1.00    Years: 51.00    Pack years: 51.00    Types: Cigarettes   Smokeless tobacco: Never   Tobacco comments:    QUIT NOV 2010  Vaping Use   Vaping Use: Never used  Substance Use Topics   Alcohol use: No    Comment: NOV 2010   Drug use: Yes    Types: Cocaine    Comment: NOV 2010 LAST USED    Home Medications Prior to Admission medications   Medication Sig Start Date End Date Taking? Authorizing Provider  acetaminophen (TYLENOL) 325 MG tablet Take 2 tablets (650 mg total) by mouth every 6 (six) hours as needed for mild pain (or Fever >/= 101). 08/21/16   Geradine Girt, DO  aspirin EC 81 MG tablet Take 81 mg by mouth daily.    [provider]  atorvastatin (LIPITOR) 20 MG tablet TAKE ONE TABLET BY MOUTH DAILY Patient taking differently: Take 20 mg by mouth daily. 09/07/14   Reed, Tiffany L, DO  carvedilol (COREG) 3.125 MG tablet Take 3.125 mg by mouth 2 (two) times daily. 11/11/16   [provider]  clopidogrel (PLAVIX) 75 MG tablet Take 75 mg by mouth daily.    [provider]  cyclobenzaprine (FLEXERIL) 10 MG tablet Take 10 mg by mouth 3 (three) times daily as needed for muscle spasms.    [provider]  feeding supplement (ENSURE ENLIVE / ENSURE PLUS) LIQD Take 237 mLs by mouth 2 (two) times daily between meals. 03/30/21 04/29/21  Arrien, Jimmy Picket, MD   finasteride (PROSCAR) 5 MG tablet Take 5 mg by mouth daily. 03/16/21   [provider]  furosemide (LASIX) 20 MG tablet Take 1 tablet (20 mg total) by mouth every other day. 03/31/21 04/30/21  Arrien, Jimmy Picket, MD  glucose blood (FREESTYLE LITE) test strip Use as instructed Patient taking differently: 1 each by Other route daily. Use as instructed 07/31/14   Blanchie Serve, MD  glucose monitoring kit (FREESTYLE) monitoring kit 1 each by Does not apply route as needed for other. For blood sugar check Patient taking differently: 1 each by Does not apply route daily. For blood sugar check 07/31/14   Blanchie Serve, MD  Lancets 28G MISC To check blood sugar three times daily with meals and at bedtime Patient taking differently: 1  strip by Other route daily. 07/31/14   Blanchie Serve, MD  levothyroxine (SYNTHROID, LEVOTHROID) 100 MCG tablet Take 100 mcg by mouth daily before breakfast.     [provider]  losartan (COZAAR) 25 MG tablet Take 1 tablet (25 mg total) by mouth daily. 03/30/21 04/29/21  Arrien, Jimmy Picket, MD  metFORMIN (GLUCOPHAGE) 500 MG tablet Take 500 mg by mouth daily with breakfast.     [provider]  Multiple Vitamin (MULTIVITAMIN WITH MINERALS) TABS tablet Take 1 tablet by mouth daily. 03/31/21 04/30/21  Arrien, Jimmy Picket, MD  oxyCODONE (OXY IR/ROXICODONE) 5 MG immediate release tablet Take 1 tablet (5 mg total) by mouth every 6 (six) hours as needed for severe pain. 03/30/21   Arrien, Jimmy Picket, MD  polyethylene glycol (MIRALAX / GLYCOLAX) 17 g packet Take 17 g by mouth daily as needed for moderate constipation. 03/30/21   Arrien, Jimmy Picket, MD    Allergies    Rocephin [ceftriaxone]  Review of Systems   Review of Systems Unable to review systems secondary to altered mental status, aphasia. Physical Exam Updated Vital Signs BP (!) 164/86 (BP Location: Right Arm)   Pulse 81   Temp 98.2 F (36.8 C) (Rectal)   Resp 16   SpO2 98%    Physical Exam Vitals and nursing note reviewed.  Constitutional:      General: He is not in acute distress.    Appearance: He is well-developed. He is not diaphoretic.  HENT:     Head: Normocephalic and atraumatic.  Eyes:     General: No scleral icterus.    Conjunctiva/sclera: Conjunctivae normal.     Comments: Right thigh with opaque cataract, left eye appears normal.  Patient makes eye contact when spoken to  Cardiovascular:     Rate and Rhythm: Normal rate and regular rhythm.     Heart sounds: Normal heart sounds.  Pulmonary:     Effort: Pulmonary effort is normal. No respiratory distress.     Breath sounds: Normal breath sounds.  Abdominal:     Palpations: Abdomen is soft.     Tenderness: There is no abdominal tenderness.  Genitourinary:    Comments: Small amount of blood noted from the urethral meatus. There is an incision site on the left [place].  No obvious signs of infection, no bleeding or drainage Musculoskeletal:     Cervical back: Normal range of motion and neck supple.  Skin:    General: Skin is warm and dry.  Neurological:     Mental Status: He is alert.     Comments: Patient with garbled speech.  Unable to follow commands.  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    ED Results / Procedures / Treatments   Labs (all labs ordered are listed, but only abnormal results are displayed) Labs Reviewed  RESP PANEL BY RT-PCR (FLU A&B, COVID) ARPGX2  ETHANOL  PROTIME-INR  APTT  CBC  DIFFERENTIAL  COMPREHENSIVE METABOLIC PANEL  RAPID URINE DRUG SCREEN, HOSP PERFORMED  URINALYSIS, ROUTINE W REFLEX MICROSCOPIC  I-STAT CHEM 8, ED    EKG EKG Interpretation  Date/Time:  Friday April 08 2021 10:08:00 EDT Ventricular Rate:  81 PR Interval:  159 QRS Duration: 106 QT Interval:  368 QTC Calculation: 428 R Axis:   36 Text Interpretation: Sinus rhythm Low voltage, extremity leads Confirmed by  Davonna Belling [phone]) on 04/08/2021 10:14:25 AM  Radiology No results found.  Procedures .Critical Care  Date/Time: 04/08/2021 10:57 AM Performed by: Margarita Mail, PA-C Authorized by: Margarita Mail, PA-C   Critical care provider statement:    Critical care time (minutes):  30   Critical care time was exclusive of:  Separately billable procedures and treating other patients   Critical care was necessary to treat or prevent imminent or life-threatening deterioration of the following conditions:  CNS failure or compromise   Critical care was time spent personally by me on the following activities:  Discussions with consultants, evaluation of patient's response to treatment, examination of patient, ordering and performing treatments and interventions, ordering and review of laboratory studies, ordering and review of radiographic studies, pulse oximetry, re-evaluation of patient's condition, obtaining history from patient or surrogate and review of old charts   Medications Ordered in ED Medications  iohexol (OMNIPAQUE) 350 MG/ML injection 100 mL (100 mLs Intravenous Contrast Given 04/08/21 1045)    ED Course  I have reviewed the triage vital signs and the nursing notes.  Pertinent labs & imaging results that were available during my care of the patient were reviewed by me and considered in my medical decision making (see chart for details).  Clinical Course as of 04/08/21 1058  Fri Apr 08, 2021  1015 Patient here with flaccid paralysis of the right lower extremity, garbled speech, code stroke initiated.  Unknown last known well.  Review of EMR shows that in previous notes from his admission he was able to move all extremities.  Report from nursing triage is that he normally has a GCS of 15 and is communicative.  Concern for LVO. [AH]    Clinical Course User Index [AH] Margarita Mail, PA-C   MDM Rules/Calculators/A&P                          11:00 AM Patient with LVO- taken  directly from CT scan to IR suite for thrombectomy. Labs have not returned. EKG shows sinus rhythm at a rated of 81  Final Clinical Impression(s) / ED Diagnoses Final diagnoses:  Cerebrovascular accident (CVA), unspecified mechanism [facility])    Rx / DC Orders ED Discharge Orders     None        Margarita Mail, PA-C 04/08/21 1101    Davonna Belling, MD 04/08/21 1553

## 2021-04-08 NOTE — Anesthesia Procedure Notes (Signed)
Procedure Name: Intubation Date/Time: 04/08/2021 11:25 AM Performed by: Gaylene Brooks, CRNA Pre-anesthesia Checklist: Patient identified, Emergency Drugs available, Suction available and Patient being monitored Patient Re-evaluated:Patient Re-evaluated prior to induction Oxygen Delivery Method: Circle System Utilized Preoxygenation: Pre-oxygenation with 100% oxygen Induction Type: IV induction, Rapid sequence and Cricoid Pressure applied Laryngoscope Size: Glidescope and 3 Grade View: Grade I Tube type: Oral Tube size: 8.0 mm Number of attempts: 1 Airway Equipment and Method: Stylet and Oral airway Placement Confirmation: ETT inserted through vocal cords under direct vision, positive ETCO2 and breath sounds checked- equal and bilateral Secured at: 22 cm Tube secured with: Tape Dental Injury: Teeth and Oropharynx as per pre-operative assessment

## 2021-04-08 NOTE — ED Notes (Signed)
Pt departed room 22 for ct scan room 2, pt weighed at that time

## 2021-04-08 NOTE — Progress Notes (Signed)
MD paged regarding SBP >140 and the need for IV pain medication d/t NPO.  Awaiting call back.

## 2021-04-08 NOTE — ED Notes (Signed)
Ct scan and with cta complete awaiting decision for IR.  Attempting at this time to obtain labs, unsuccessful

## 2021-04-08 NOTE — Progress Notes (Signed)
Patient ID: ANTAR MILKS, male   DOB: 12/18/1945, 75 y.o.   MRN: [id number] 75 Y RT H M LSW  last night MRS 1 to2. New onset RT sided weakness and global aphasia. CT No ICH. CTA occluded  Lt MCA inf division branch M2 region. CTP core of 68ml  v mismatch of 74ml . Endovascular  treatment D/W daughter over the phone. Procedure,reasons, alternatives reviewed. Risks of ICH of 10 % and inability to revascularize discussed. She expressed understanding  and  provided consent tp proceed. S.Akshat Minehart MD

## 2021-04-08 NOTE — Anesthesia Postprocedure Evaluation (Signed)
Anesthesia Post Note  Patient: Brad Harrell  Procedure(s) Performed: IR WITH ANESTHESIA     Patient location during evaluation: PACU Anesthesia Type: General Level of consciousness: awake and alert Pain management: pain level controlled Vital Signs Assessment: post-procedure vital signs reviewed and stable Respiratory status: spontaneous breathing, nonlabored ventilation, respiratory function stable and patient connected to nasal cannula oxygen Cardiovascular status: blood pressure returned to baseline and stable Postop Assessment: no apparent nausea or vomiting Anesthetic complications: no   No notable events documented.  Last Vitals:  Vitals:   04/08/21 1405 04/08/21 1410  BP:    Pulse: (!) 108 (!) 107  Resp: 16 15  Temp:    SpO2: 91% 90%    Last Pain:  Vitals:   04/08/21 1008  TempSrc: Rectal                 Skye Plamondon,W. EDMOND

## 2021-04-08 NOTE — ED Notes (Signed)
Departed ct scan, pt transport to IR

## 2021-04-08 NOTE — Anesthesia Procedure Notes (Signed)
Arterial Line Insertion Start/End6/07/2021 11:28 AM, 04/08/2021 11:32 AM Performed by: Gaylene Brooks, CRNA, CRNA  Preanesthetic checklist: patient identified, IV checked, site marked, risks and benefits discussed, surgical consent, monitors and equipment checked, pre-op evaluation, timeout performed and anesthesia consent Left, radial was placed Catheter size: 20 G Hand hygiene performed  and maximum sterile barriers used   Attempts: 1 Procedure performed without using ultrasound guided technique. Following insertion, dressing applied and Biopatch. Post procedure assessment: normal  Patient tolerated the procedure well with no immediate complications.

## 2021-04-08 NOTE — Procedures (Signed)
S/P Lt common carotid arteriogram  RT CFA approach  S/P complrye revascularization of occluded Inf division Lt MCA m2 branch with x1 pass with 67mmx 40 mm solitaire retriever and contact aspiration achieving a TICi 3 revascularization. Post CT no ICH. 44F angioseal for hemostasis Rt groin puncture site.  Distal pulses both DPs dopplerable. Extubated Moving Lt side spontaneously. Able to lift RT UE.  S.Zaakirah Kistner MD

## 2021-04-08 NOTE — ED Triage Notes (Signed)
Pt comes from [place] via EMS. Staff reported that pt woke up this morning with AMS, garbled language. No LKN. Hx of stroke right side deficits, unknown if worse or not. EMS reports GCS 12 staff reports normal GCS of 15. Pt able to follow some simple commands at this time. EMS unable to understand any verbal responses. 18g IV established in LAC BGL 177 BP 150/100 HR 100 RR 18 O2 99% RA

## 2021-04-08 NOTE — ED Notes (Signed)
Pt arrived to ct scan room 2 and moved to ct table

## 2021-04-09 DIAGNOSIS — I6930 Unspecified sequelae of cerebral infarction: Secondary | ICD-10-CM

## 2021-04-09 DIAGNOSIS — I639 Cerebral infarction, unspecified: Secondary | ICD-10-CM

## 2021-04-09 LAB — CBC WITH DIFFERENTIAL/PLATELET
Abs Immature Granulocytes: 0.03 10*3/uL (ref 0.00–0.07)
Basophils Absolute: 0 10*3/uL (ref 0.0–0.1)
Basophils Relative: 0 %
Eosinophils Absolute: 0 10*3/uL (ref 0.0–0.5)
Eosinophils Relative: 0 %
HCT: 27.3 % — ABNORMAL LOW (ref 39.0–52.0)
Hemoglobin: 8.6 g/dL — ABNORMAL LOW (ref 13.0–17.0)
Immature Granulocytes: 0 %
Lymphocytes Relative: 30 %
Lymphs Abs: 2.2 10*3/uL (ref 0.7–4.0)
MCH: 26.4 pg (ref 26.0–34.0)
MCHC: 31.5 g/dL (ref 30.0–36.0)
MCV: 83.7 fL (ref 80.0–100.0)
Monocytes Absolute: 0.6 10*3/uL (ref 0.1–1.0)
Monocytes Relative: 8 %
Neutro Abs: 4.5 10*3/uL (ref 1.7–7.7)
Neutrophils Relative %: 62 %
Platelets: 266 10*3/uL (ref 150–400)
RBC: 3.26 MIL/uL — ABNORMAL LOW (ref 4.22–5.81)
RDW: 18.2 % — ABNORMAL HIGH (ref 11.5–15.5)
WBC: 7.3 10*3/uL (ref 4.0–10.5)
nRBC: 0 % (ref 0.0–0.2)

## 2021-04-09 LAB — BASIC METABOLIC PANEL
Anion gap: 8 (ref 5–15)
BUN: 12 mg/dL (ref 8–23)
CO2: 22 mmol/L (ref 22–32)
Calcium: 8.6 mg/dL — ABNORMAL LOW (ref 8.9–10.3)
Chloride: 105 mmol/L (ref 98–111)
Creatinine, Ser: 0.88 mg/dL (ref 0.61–1.24)
GFR, Estimated: >60 mL/min (ref >60)
Glucose, Bld: 130 mg/dL — ABNORMAL HIGH (ref 70–99)
Potassium: 4.4 mmol/L (ref 3.5–5.1)
Sodium: 135 mmol/L (ref 135–145)

## 2021-04-09 LAB — LIPID PANEL
Cholesterol: 108 mg/dL (ref 0–200)
HDL: 18 mg/dL — ABNORMAL LOW (ref >40)
LDL Cholesterol: 53 mg/dL (ref 0–99)
Total CHOL/HDL Ratio: 6 RATIO
Triglycerides: 183 mg/dL — ABNORMAL HIGH (ref <150)
VLDL: 37 mg/dL (ref 0–40)

## 2021-04-09 LAB — GLUCOSE, CAPILLARY
Glucose-Capillary: 113 mg/dL — ABNORMAL HIGH (ref 70–99)
Glucose-Capillary: 128 mg/dL — ABNORMAL HIGH (ref 70–99)
Glucose-Capillary: 147 mg/dL — ABNORMAL HIGH (ref 70–99)
Glucose-Capillary: 152 mg/dL — ABNORMAL HIGH (ref 70–99)
Glucose-Capillary: 171 mg/dL — ABNORMAL HIGH (ref 70–99)
Glucose-Capillary: 88 mg/dL (ref 70–99)

## 2021-04-09 NOTE — Progress Notes (Signed)
STROKE TEAM PROGRESS NOTE   INTERVAL HISTORY  No overnight events or new complaints. The patient was resting comfortably in chair and woke to voice. He is able to have a simple conversation and follows commands consistent.  Vitals:   04/09/21 0600 04/09/21 0700 04/09/21 0755 04/09/21 0800  BP: (!) 101/50 (!) 101/54 (!) 114/57 (!) 106/51  Pulse: 73 76 78 77  Resp: 15 15 17 17   Temp:   98.9 F (37.2 C)   TempSrc:   Oral   SpO2: 97% 95% 96% 96%  Weight:       CBC:  Recent Labs  Lab 04/08/21 1431 04/09/21 0500  WBC 12.2* 7.3  NEUTROABS 11.3* 4.5  HGB 10.3* 8.6*  HCT 32.4* 27.3*  MCV 82.4 83.7  PLT 360 702   Basic Metabolic Panel:  Recent Labs  Lab 04/08/21 1907 04/09/21 0500  NA 134* 135  K 4.9 4.4  CL 106 105  CO2 21* 22  GLUCOSE 218* 130*  BUN 17 12  CREATININE 0.94 0.88  CALCIUM 8.8* 8.6*   Lipid Panel:  Recent Labs  Lab 04/09/21 0500  CHOL 108  TRIG 183*  HDL 18*  CHOLHDL 6.0  VLDL 37  LDLCALC 53   HgbA1c: No results for input(Brad): HGBA1C in the last 168 hours. Urine Drug Screen:  Recent Labs  Lab 04/08/21 1431  LABOPIA NONE DETECTED  COCAINSCRNUR NONE DETECTED  LABBENZ NONE DETECTED  AMPHETMU NONE DETECTED  THCU NONE DETECTED  LABBARB NONE DETECTED    Alcohol Level  Recent Labs  Lab 04/08/21 1431  ETH <10    IMAGING past 24 hours MR BRAIN WO CONTRAST  Result Date: 04/08/2021 Brad Bras, MD     04/08/2021  1:21 PM Brad/P Lt common carotid arteriogram RT CFA approach Brad/P complrye revascularization of occluded Inf division Lt MCA m2 branch with x1 pass with 26mmx 40 mm solitaire retriever and contact aspiration achieving a TICi 3 revascularization. Post CT no ICH. 41F angioseal for hemostasis Rt groin puncture site. Distal pulses both DPs dopplerable. Extubated Moving Lt side spontaneously. Able to lift RT UE. Brad.Deveshwar MD  IR PERCUTANEOUS ART THROMBECTOMY/INFUSION INTRACRANIAL INC DIAG ANGIO  Result Date: 04/08/2021 Brad Bras,  MD     04/08/2021  1:21 PM Brad/P Lt common carotid arteriogram RT CFA approach Brad/P complrye revascularization of occluded Inf division Lt MCA m2 branch with x1 pass with 38mmx 40 mm solitaire retriever and contact aspiration achieving a TICi 3 revascularization. Post CT no ICH. 41F angioseal for hemostasis Rt groin puncture site. Distal pulses both DPs dopplerable. Extubated Moving Lt side spontaneously. Able to lift RT UE. Brad.Deveshwar MD  CT HEAD CODE STROKE WO CONTRAST  Result Date: 04/08/2021 CLINICAL DATA:  Acute headache.  Right-sided weakness and aphasia. EXAM: CT HEAD WITHOUT CONTRAST CT ANGIOGRAPHY HEAD AND NECK CT PERFUSION BRAIN TECHNIQUE: Contiguous axial images were obtained from the base of the skull through the vertex without intravenous contrast. Multidetector CT imaging of the head and neck was performed using the standard protocol during bolus administration of intravenous contrast. Multiplanar CT image reconstructions and MIPs were obtained to evaluate the vascular anatomy. Carotid stenosis measurements (when applicable) are obtained utilizing NASCET criteria, using the distal internal carotid diameter as the denominator. Multiphase CT imaging of the brain was performed following IV bolus contrast injection. Subsequent parametric perfusion maps were calculated using RAPID software. CONTRAST:  150mL OMNIPAQUE IOHEXOL 350 MG/ML SOLN COMPARISON:: COMPARISON: Same day head CT.  MRA 07/01/2014. FINDINGS: CT HEAD FINDINGS  Brain: No evidence of acute large vascular territory infarction, hemorrhage, hydrocephalus, extra-axial collection or mass lesion/mass effect. Moderate to advanced confluent and patchy white matter hypoattenuation, compatible with chronic microvascular ischemic disease. Remote inferior left cerebellar infarct. ASPECTS 10. Vascular: See below. Skull: No acute fracture. Sinuses: Mild-to-moderate scattered ethmoid air cell mucosal thickening. Small left maxillary sinus, partially imaged.  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 Review of the MIP images confirms the above findings CTA NECK FINDINGS Aortic arch: Great vessel origins are patent. Right carotid system: Calcific and noncalcific atherosclerosis at the carotid bifurcation without greater than 50% narrowing. Left carotid system: Calcific and noncalcific atherosclerosis at the carotid bifurcation without greater than 50% narrowing. Vertebral arteries: Streak artifact limits evaluation lower neck; however, suspected occlusion of bilateral vertebral artery origins with reconstitution in the mid neck, likely from collaterals. Irregular opacification of more distal right vertebral artery with multifocal narrowing. Focal severe stenosis of the left vertebral artery at C4-C5. Skeleton: No acute abnormality. Severe multilevel degenerative change in lower cervical spine. Other neck: Secretions in the trachea, placing the patient at risk for aspiration. Upper chest: Emphysema. Review of the MIP images confirms the above findings CTA HEAD FINDINGS Anterior circulation: Extensive calcific atherosclerosis of bilateral cavernous and paraclinoid ICAs with severe right and moderate left paraclinoid ICA narrowing. Patent M1 MCAs. Focal short-segment opacification of a left distal M2 MCA branch with occlusion. This vessel is non-opacified more proximally in the sylvian fissure, compatible with a more proximal M2 occlusion or high-grade stenosis. The exact location of M2 occlusion/stenosis is not confidently identified. Patent ACAs. No visible aneurysm. Posterior circulation: Small vertebrobasilar system. Moderate right intradural vertebral artery stenosis due to calcific atherosclerosis. Small bilateral posterior cerebral arteries. Small bilateral posterior communicating arteries. Moderate right P2 PCA stenosis. Otherwise mild multifocal bilateral PCA narrowing. CT Brain Perfusion Findings:  ASPECTS: 10 CBF (<30%) Volume: 76mL Perfusion (Tmax>6.0s) volume: 61mL Mismatch Volume: 44mL INFARCT LOCATION: Left posterior MCA territory IMPRESSION: CT head: 1. No evidence of acute large vascular territory infarct or hemorrhage by CT. ASPECTS 10. 2. Moderate to advanced chronic microvascular ischemic disease. 3. Remote inferior left cerebellar infarct. 4. 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 CT perfusion: 1. Rapid calculates a 31 mL area of mismatch/penumbra in the posterior left MCA territory with 7 mL core infarct. CTA head: 1. Focal short-segment opacification of a left distal M2 MCA branch with occlusion. This vessel is non-opacified more proximally in the sylvian fissure, compatible with a more proximal M2 occlusion or high-grade stenosis. The exact location of M2 occlusion/stenosis is not confidently identified. Catheter arteriogram could further characterize if clinically indicated. 2. Severe right and moderate left paraclinoid ICA narrowing due to calcific atherosclerosis. 3. 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 4. Superimposed moderate right intradural and right P2 PCA stenosis. CTA neck: 1. Streak artifact limits evaluation proximally with suspected age indeterminate occlusion of bilateral vertebral artery origins with reconstitution in the mid neck. Multifocal narrowing of the remainder of the right vertebral artery and focal narrowing of the left vertebral artery at C4-C5. 2. Secretions in the trachea, placing the patient at risk for aspiration. 3. Emphysema. Critical findings discussed with Dr. Quinn Axe via telephone at 10:40 a.m. Electronically Signed   By: Margaretha Sheffield MD   On: 04/08/2021 11:29   CT ANGIO HEAD NECK W WO CM W PERF (CODE STROKE)  Result Date: 04/08/2021 CLINICAL DATA:  Acute headache.  Right-sided weakness and aphasia. EXAM:  CT HEAD WITHOUT CONTRAST CT ANGIOGRAPHY HEAD AND NECK CT PERFUSION BRAIN TECHNIQUE: Contiguous axial images were obtained from the base of the skull through the vertex without intravenous contrast. Multidetector CT imaging of the head and neck was performed using the standard protocol during bolus administration of intravenous contrast. Multiplanar CT image reconstructions and MIPs were obtained to evaluate the vascular anatomy. Carotid stenosis measurements (when applicable) are obtained utilizing NASCET criteria, using the distal internal carotid diameter as the denominator. Multiphase CT imaging of the brain was performed following IV bolus contrast injection. Subsequent parametric perfusion maps were calculated using RAPID software. CONTRAST:  135mL OMNIPAQUE IOHEXOL 350 MG/ML SOLN COMPARISON:: COMPARISON: Same day head CT.  MRA 07/01/2014. FINDINGS: CT HEAD FINDINGS Brain: No evidence of acute large vascular territory infarction, hemorrhage, hydrocephalus, extra-axial collection or mass lesion/mass effect. Moderate to advanced confluent and patchy white matter hypoattenuation, compatible with chronic microvascular ischemic disease. Remote inferior left cerebellar infarct. ASPECTS 10. Vascular: See below. Skull: No acute fracture. Sinuses: Mild-to-moderate scattered ethmoid air cell mucosal thickening. Small left maxillary sinus, partially imaged. 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 Review of the MIP images confirms the above findings CTA NECK FINDINGS Aortic arch: Great vessel origins are patent. Right carotid system: Calcific and noncalcific atherosclerosis at the carotid bifurcation without greater than 50% narrowing. Left carotid system: Calcific and noncalcific atherosclerosis at the carotid bifurcation without greater than 50% narrowing. Vertebral arteries: Streak artifact limits evaluation lower neck; however, suspected occlusion of  bilateral vertebral artery origins with reconstitution in the mid neck, likely from collaterals. Irregular opacification of more distal right vertebral artery with multifocal narrowing. Focal severe stenosis of the left vertebral artery at C4-C5. Skeleton: No acute abnormality. Severe multilevel degenerative change in lower cervical spine. Other neck: Secretions in the trachea, placing the patient at risk for aspiration. Upper chest: Emphysema. Review of the MIP images confirms the above findings CTA HEAD FINDINGS Anterior circulation: Extensive calcific atherosclerosis of bilateral cavernous and paraclinoid ICAs with severe right and moderate left paraclinoid ICA narrowing. Patent M1 MCAs. Focal short-segment opacification of a left distal M2 MCA branch with occlusion. This vessel is non-opacified more proximally in the sylvian fissure, compatible with a more proximal M2 occlusion or high-grade stenosis. The exact location of M2 occlusion/stenosis is not confidently identified. Patent ACAs. No visible aneurysm. Posterior circulation: Small vertebrobasilar system. Moderate right intradural vertebral artery stenosis due to calcific atherosclerosis. Small bilateral posterior cerebral arteries. Small bilateral posterior communicating arteries. Moderate right P2 PCA stenosis. Otherwise mild multifocal bilateral PCA narrowing. CT Brain Perfusion Findings: ASPECTS: 10 CBF (<30%) Volume: 44mL Perfusion (Tmax>6.0s) volume: 21mL Mismatch Volume: 40mL INFARCT LOCATION: Left posterior MCA territory IMPRESSION: CT head: 1. No evidence of acute large vascular territory infarct or hemorrhage by CT. ASPECTS 10. 2. Moderate to advanced chronic microvascular ischemic disease. 3. Remote inferior left cerebellar infarct. 4. 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 CT perfusion: 1. Rapid calculates a 31 mL area of mismatch/penumbra in the posterior left MCA territory with 7 mL  core infarct. CTA head: 1. Focal short-segment opacification of a left distal M2 MCA branch with occlusion. This vessel is non-opacified more proximally in the sylvian fissure, compatible with a more proximal M2 occlusion or high-grade stenosis. The exact location of M2 occlusion/stenosis is not confidently identified. Catheter arteriogram could further characterize  if clinically indicated. 2. Severe right and moderate left paraclinoid ICA narrowing due to calcific atherosclerosis. 3. 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 4. Superimposed moderate right intradural and right P2 PCA stenosis. CTA neck: 1. Streak artifact limits evaluation proximally with suspected age indeterminate occlusion of bilateral vertebral artery origins with reconstitution in the mid neck. Multifocal narrowing of the remainder of the right vertebral artery and focal narrowing of the left vertebral artery at C4-C5. 2. Secretions in the trachea, placing the patient at risk for aspiration. 3. Emphysema. Critical findings discussed with Dr. Quinn Axe via telephone at 10:40 a.m. Electronically Signed   By: Margaretha Sheffield MD   On: 04/08/2021 11:29    Echocardiogram 03/24/2021:   FINDINGS   Left Ventricle: ejection fraction, by estimation, is 25 to 30%. Left ventricular ejection fraction by 2D MOD biplane is 26.6 %.  The left ventricle has severely decreased function. The left ventricle  demonstrates global hypokinesis. The  left ventricular internal cavity size was normal in size. There is no left  ventricular hypertrophy. Indeterminate diastolic filling due to E-A  fusion.  Right Ventricle: The right ventricular size is normal. No increase in  right ventricular wall thickness. Right ventricular systolic function is  mildly reduced. Tricuspid regurgitation signal is inadequate for assessing  PA pressure.  IAS/Shunts: The atrial septum is grossly  normal.   MRI brain - Pending   PHYSICAL EXAM The patient was sleeping comfortably in chair and woke to voice. He was alert and oriented to self and place, able to have a simple conversation and follows commands consistent. Mild decreased strength in left arm and leg which may be residual from prior stroke. Right leg seemed to be weaker.  ASSESSMENT/PLAN Brad Harrell is a 75 y.o. male residing in nursing home Hx HLD, HTN, tobacco abuse, and DM II, prior strokes found to be globally aphasic NIHSS 17 and LKW clear and therefore not a candidate for tPA found to have left distal M2 MCA branch occlusion noted on CTA and patient was taken emergently to IR after consent by daughter. Brad/P thrombectomy with TICI 3 revascularization now markedly improved. He only has very mild aphasia  Stroke:  left distal MCA occlusion Code Stroke CT head No acute abnormality. Small vessel disease. Atrophy. ASPECTS 10. Remote inferior left cerebellar infarct.    CTA head Focal short-segment opacification of a left distal M2 MCA branch with occlusion, moderate right intradural and right P2 PCA stenosis. CTA neck Streak artifact limits evaluation proximally with suspected age indeterminate occlusion of bilateral vertebral artery origins with reconstitution in the mid neck. Multifocal narrowing of the remainder of the right vertebral artery and focal narrowing of the left vertebral artery at C4-C5.  CTP core of 60ml  v mismatch of 17ml . MRI brain - Pending Echocardiogram showed EF 25-30% Global hypokinesis, no LVT or IAS LDL 53 HgbA1c 5.3 VTE prophylaxis - Enoxaparin    Diet   Diet regular Room service appropriate? Yes; Fluid consistency: Thin   aspirin 81 mg daily prior to admission, now on aspirin 81 mg daily and clopidogrel 75 mg daily.  Therapy recommendations:  continue Disposition:  TBD  Hypertension Continue clevidipine for SBP<140 Long-term BP goal normotensive  Hyperlipidemia LDL 53, goal <  70 High intensity statin atorvastatin 20mg  Continue statin at discharge  Diabetes type II Controlled HgbA1c 5.3, goal < 7.0 CBGs Recent Labs    04/08/21 2348 04/09/21 0351 04/09/21  3779  GLUCAP 128* 128* 113*   SSI  Chronic systolic HF - TTE pending - Hold carvedilol and losartan in the setting of acute stroke   Hypothyroidism - Continue home synthroid 1041mcg daily   Urinary retention 2/2 BPH - Continue home finasteride  Hospital day # 1   Lynnae Sandhoff, MD Page: [phone] Stroke Neurology  To contact Stroke Continuity provider, please refer to [email or web address] After hours, contact General Neurology

## 2021-04-09 NOTE — Progress Notes (Signed)
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  He is on aspirin and Plavix due to his past strokes.  He was discharged from [facility] on 03/30/21 to SNF for rehab.  He presented  to the ED via EMS from his rehab facility for altered mental status.   ED Staff noted global aphasia with right sided hemiparesis and a code stroke was called.    Patient was not a candidate for tPA due to unknown last known well.   CTA showed occlusion M2 branch L MCA and corresponding 31cc area mismatch/penumbra in posterior L MCA territory with 7 cc core infarct.   He urgently brought to [facility] for intervetion.  He underwent a Lt common carotid arteriogram RT CFA approach S/P complete revascularization of occluded Inf division Lt MCA m2 branch with x1 pass with 68mx 40 mm solitaire retriever and contact aspiration achieving a TICi 3 revascularization. 28F angioseal for hemostasis Rt groin puncture site.  Subjective:  No complaints. Oriented to place. Does not know month/date/day of week.  Allergies: Rocephin [ceftriaxone]  Medications: Prior to Admission medications   Medication Sig Start Date End Date Taking? Authorizing Provider  acetaminophen (TYLENOL) 325 MG tablet Take 2 tablets (650 mg total) by mouth every 6 (six) hours as needed for mild pain (or Fever >/= 101). Patient taking differently: Take 650 mg by mouth every 4 (four) hours as needed (elevated temp). 08/21/16  Yes VGeradine Girt DO  aspirin EC 81 MG tablet Take 81 mg by mouth daily at 12 noon.    Yes [provider]  atorvastatin (LIPITOR) 20 MG tablet TAKE ONE TABLET BY MOUTH DAILY Patient taking differently: Take 20 mg by mouth every evening. 09/07/14  Yes Reed, Tiffany L, DO  carvedilol (COREG) 3.125 MG tablet Take 3.125 mg by mouth 2 (two) times daily with a meal. 11/11/16  Yes [provider]  clopidogrel (PLAVIX) 75 MG tablet Take 75 mg by mouth daily at 12 noon.   Yes [provider]  finasteride (PROSCAR) 5 MG tablet Take 5 mg by mouth daily at 12 noon. 03/16/21  Yes [provider]  furosemide (LASIX) 20 MG tablet Take 1 tablet (20 mg total) by mouth every other day. 03/31/21 04/30/21 Yes Arrien, MJimmy Picket MD  levothyroxine (SYNTHROID, LEVOTHROID) 100 MCG tablet Take 100 mcg by mouth daily before breakfast.    Yes [provider]  losartan (COZAAR) 25 MG tablet Take 1 tablet (25 mg total) by mouth daily. Patient taking differently: Take 25 mg by mouth daily at 12 noon. 03/30/21 04/29/21 Yes Arrien, MJimmy Picket MD  metFORMIN (GLUCOPHAGE) 500 MG tablet Take 500 mg by mouth daily with breakfast.    Yes [provider]  Multiple Vitamin (MULTIVITAMIN WITH MINERALS) TABS tablet Take 1 tablet by mouth daily. 03/31/21 04/30/21 Yes Arrien, MJimmy Picket MD  oxyCODONE (OXY IR/ROXICODONE) 5 MG immediate release tablet Take 1 tablet (5 mg total) by mouth every 6 (six) hours as needed for severe pain. 03/30/21  Yes Arrien, MJimmy Picket MD  polyethylene glycol (MIRALAX / GLYCOLAX) 17 g packet Take 17 g  by mouth daily as needed for moderate constipation. 03/30/21  Yes Arrien, Jimmy Picket, MD  feeding supplement (ENSURE ENLIVE / ENSURE PLUS) LIQD Take 237 mLs by mouth 2 (two) times daily between meals. Patient not taking: No sig reported 03/30/21 04/29/21  Arrien, Jimmy Picket, MD  glucose blood (FREESTYLE LITE) test strip Use as instructed Patient taking differently: 1 each by Other route daily. Use as instructed 07/31/14   Blanchie Serve,  MD  glucose monitoring kit (FREESTYLE) monitoring kit 1 each by Does not apply route as needed for other. For blood sugar check Patient taking differently: 1 each by Does not apply route daily. For blood sugar check 07/31/14   Blanchie Serve, MD  Lancets 28G MISC To check blood sugar three times daily with meals and at bedtime Patient taking differently: 1 strip by Other route daily. 07/31/14   Blanchie Serve, MD     Vital Signs: BP (!) 121/57   Pulse 79   Temp 98.9 F (37.2 C) (Oral)   Resp 17   Wt 75 kg   SpO2 97%   BMI 29.29 kg/m   Physical Exam Alert, awake, and oriented to place, but not to date/time. Speech and comprehension intact. PERRL Left, right eye blind (chronic). EOM intact left eye subjective diplopia. Some difficulty with raising left arm off bed, held a few seconds with assistance. 4/5 Strength bilaterally Common femoral artery puncture site looks good, no bleeding, no hematoma, no pseudoaneurysm Heart Regular No respiratory issues.  Imaging: MR BRAIN WO CONTRAST  Result Date: 04/08/2021 Luanne Bras, MD     04/08/2021  1:21 PM S/P Lt common carotid arteriogram RT CFA approach S/P complrye revascularization of occluded Inf division Lt MCA m2 branch with x1 pass with 68mx 40 mm solitaire retriever and contact aspiration achieving a TICi 3 revascularization. Post CT no ICH. 612F angioseal for hemostasis Rt groin puncture site. Distal pulses both DPs dopplerable. Extubated Moving Lt side spontaneously. Able to lift RT UE. [name] MD  IR PERCUTANEOUS ART THROMBECTOMY/INFUSION INTRACRANIAL INC DIAG ANGIO  Result Date: 04/08/2021 DLuanne Bras MD     04/08/2021  1:21 PM S/P Lt common carotid arteriogram RT CFA approach S/P complrye revascularization of occluded Inf division Lt MCA m2 branch with x1 pass with 350m 40 mm solitaire retriever and contact aspiration achieving a TICi 3 revascularization. Post CT no ICH. 612F angioseal for hemostasis Rt groin puncture  site. Distal pulses both DPs dopplerable. Extubated Moving Lt side spontaneously. Able to lift RT UE. [name] MD  CT HEAD CODE STROKE WO CONTRAST  Result Date: 04/08/2021 CLINICAL DATA:  Acute headache.  Right-sided weakness and aphasia. EXAM: CT HEAD WITHOUT CONTRAST CT ANGIOGRAPHY HEAD AND NECK CT PERFUSION BRAIN TECHNIQUE: Contiguous axial images were obtained from the base of the skull through the vertex without intravenous contrast. Multidetector CT imaging of the head and neck was performed using the standard protocol during bolus administration of intravenous contrast. Multiplanar CT image reconstructions and MIPs were obtained to evaluate the vascular anatomy. Carotid stenosis measurements (when applicable) are obtained utilizing NASCET criteria, using the distal internal carotid diameter as the denominator. Multiphase CT imaging of the brain was performed following IV bolus contrast injection. Subsequent parametric perfusion maps were calculated using RAPID software. CONTRAST:  10028mMNIPAQUE IOHEXOL 350 MG/ML SOLN COMPARISON:: COMPARISON: Same day head CT.  MRA 07/01/2014. FINDINGS: CT HEAD FINDINGS Brain: No evidence of acute large vascular territory infarction, hemorrhage, hydrocephalus, extra-axial collection or mass lesion/mass effect. Moderate to advanced confluent and  patchy white matter hypoattenuation, compatible with chronic microvascular ischemic disease. Remote inferior left cerebellar infarct. ASPECTS 10. Vascular: See below. Skull: No acute fracture. Sinuses: Mild-to-moderate scattered ethmoid air cell mucosal thickening. Small left maxillary sinus, partially imaged. 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 Review of the MIP images confirms the above findings CTA NECK FINDINGS Aortic arch: Great vessel origins are patent. Right carotid system: Calcific and noncalcific atherosclerosis at the carotid bifurcation without  greater than 50% narrowing. Left carotid system: Calcific and noncalcific atherosclerosis at the carotid bifurcation without greater than 50% narrowing. Vertebral arteries: Streak artifact limits evaluation lower neck; however, suspected occlusion of bilateral vertebral artery origins with reconstitution in the mid neck, likely from collaterals. Irregular opacification of more distal right vertebral artery with multifocal narrowing. Focal severe stenosis of the left vertebral artery at C4-C5. Skeleton: No acute abnormality. Severe multilevel degenerative change in lower cervical spine. Other neck: Secretions in the trachea, placing the patient at risk for aspiration. Upper chest: Emphysema. Review of the MIP images confirms the above findings CTA HEAD FINDINGS Anterior circulation: Extensive calcific atherosclerosis of bilateral cavernous and paraclinoid ICAs with severe right and moderate left paraclinoid ICA narrowing. Patent M1 MCAs. Focal short-segment opacification of a left distal M2 MCA branch with occlusion. This vessel is non-opacified more proximally in the sylvian fissure, compatible with a more proximal M2 occlusion or high-grade stenosis. The exact location of M2 occlusion/stenosis is not confidently identified. Patent ACAs. No visible aneurysm. Posterior circulation: Small vertebrobasilar system. Moderate right intradural vertebral artery stenosis due to calcific atherosclerosis. Small bilateral posterior cerebral arteries. Small bilateral posterior communicating arteries. Moderate right P2 PCA stenosis. Otherwise mild multifocal bilateral PCA narrowing. CT Brain Perfusion Findings: ASPECTS: 10 CBF (<30%) Volume: 51m Perfusion (Tmax>6.0s) volume: 32mMismatch Volume: 3125mNFARCT LOCATION: Left posterior MCA territory IMPRESSION: CT head: 1. No evidence of acute large vascular territory infarct or hemorrhage by CT. ASPECTS 10. 2. Moderate to advanced chronic microvascular ischemic disease. 3. Remote  inferior left cerebellar infarct. 4. 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 CT perfusion: 1. Rapid calculates a 31 mL area of mismatch/penumbra in the posterior left MCA territory with 7 mL core infarct. CTA head: 1. Focal short-segment opacification of a left distal M2 MCA branch with occlusion. This vessel is non-opacified more proximally in the sylvian fissure, compatible with a more proximal M2 occlusion or high-grade stenosis. The exact location of M2 occlusion/stenosis is not confidently identified. Catheter arteriogram could further characterize if clinically indicated. 2. Severe right and moderate left paraclinoid ICA narrowing due to calcific atherosclerosis. 3. 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 4. Superimposed moderate right intradural and right P2 PCA stenosis. CTA neck: 1. Streak artifact limits evaluation proximally with suspected age indeterminate occlusion of bilateral vertebral artery origins with reconstitution in the mid neck. Multifocal narrowing of the remainder of the right vertebral artery and focal narrowing of the left vertebral artery at C4-C5. 2. Secretions in the trachea, placing the patient at risk for aspiration. 3. Emphysema. Critical findings discussed with Dr. StaQuinn Axea telephone at 10:40 a.m. Electronically Signed   By: FreMargaretha Sheffield   On: 04/08/2021 11:29   CT ANGIO HEAD NECK W WO CM W PERF (CODE STROKE)  Result Date: 04/08/2021 CLINICAL DATA:  Acute headache.  Right-sided weakness and aphasia. EXAM: CT HEAD WITHOUT CONTRAST CT ANGIOGRAPHY HEAD AND  NECK CT PERFUSION BRAIN TECHNIQUE: Contiguous axial images were obtained from the base of the skull through the vertex without intravenous contrast. Multidetector CT imaging of the head and neck was performed using the standard protocol during bolus administration of  intravenous contrast. Multiplanar CT image reconstructions and MIPs were obtained to evaluate the vascular anatomy. Carotid stenosis measurements (when applicable) are obtained utilizing NASCET criteria, using the distal internal carotid diameter as the denominator. Multiphase CT imaging of the brain was performed following IV bolus contrast injection. Subsequent parametric perfusion maps were calculated using RAPID software. CONTRAST:  154m OMNIPAQUE IOHEXOL 350 MG/ML SOLN COMPARISON:: COMPARISON: Same day head CT.  MRA 07/01/2014. FINDINGS: CT HEAD FINDINGS Brain: No evidence of acute large vascular territory infarction, hemorrhage, hydrocephalus, extra-axial collection or mass lesion/mass effect. Moderate to advanced confluent and patchy white matter hypoattenuation, compatible with chronic microvascular ischemic disease. Remote inferior left cerebellar infarct. ASPECTS 10. Vascular: See below. Skull: No acute fracture. Sinuses: Mild-to-moderate scattered ethmoid air cell mucosal thickening. Small left maxillary sinus, partially imaged. 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 Review of the MIP images confirms the above findings CTA NECK FINDINGS Aortic arch: Great vessel origins are patent. Right carotid system: Calcific and noncalcific atherosclerosis at the carotid bifurcation without greater than 50% narrowing. Left carotid system: Calcific and noncalcific atherosclerosis at the carotid bifurcation without greater than 50% narrowing. Vertebral arteries: Streak artifact limits evaluation lower neck; however, suspected occlusion of bilateral vertebral artery origins with reconstitution in the mid neck, likely from collaterals. Irregular opacification of more distal right vertebral artery with multifocal narrowing. Focal severe stenosis of the left vertebral artery at C4-C5. Skeleton: No acute abnormality. Severe multilevel degenerative change in  lower cervical spine. Other neck: Secretions in the trachea, placing the patient at risk for aspiration. Upper chest: Emphysema. Review of the MIP images confirms the above findings CTA HEAD FINDINGS Anterior circulation: Extensive calcific atherosclerosis of bilateral cavernous and paraclinoid ICAs with severe right and moderate left paraclinoid ICA narrowing. Patent M1 MCAs. Focal short-segment opacification of a left distal M2 MCA branch with occlusion. This vessel is non-opacified more proximally in the sylvian fissure, compatible with a more proximal M2 occlusion or high-grade stenosis. The exact location of M2 occlusion/stenosis is not confidently identified. Patent ACAs. No visible aneurysm. Posterior circulation: Small vertebrobasilar system. Moderate right intradural vertebral artery stenosis due to calcific atherosclerosis. Small bilateral posterior cerebral arteries. Small bilateral posterior communicating arteries. Moderate right P2 PCA stenosis. Otherwise mild multifocal bilateral PCA narrowing. CT Brain Perfusion Findings: ASPECTS: 10 CBF (<30%) Volume: 761mPerfusion (Tmax>6.0s) volume: 3830mismatch Volume: 13m44mFARCT LOCATION: Left posterior MCA territory IMPRESSION: CT head: 1. No evidence of acute large vascular territory infarct or hemorrhage by CT. ASPECTS 10. 2. Moderate to advanced chronic microvascular ischemic disease. 3. Remote inferior left cerebellar infarct. 4. 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 CT perfusion: 1. Rapid calculates a 31 mL area of mismatch/penumbra in the posterior left MCA territory with 7 mL core infarct. CTA head: 1. Focal short-segment opacification of a left distal M2 MCA branch with occlusion. This vessel is non-opacified more proximally in the sylvian fissure, compatible with a more proximal M2 occlusion or high-grade stenosis. The exact location of M2 occlusion/stenosis is not confidently identified.  Catheter arteriogram could further characterize if clinically indicated. 2. Severe right and moderate left paraclinoid ICA narrowing due to calcific atherosclerosis. 3. Very small vertebrobasilar system, likely  secondary to the suspected bilateral vertebral artery origin occlusion described below. 4. Superimposed moderate right intradural and right P2 PCA stenosis. CTA neck: 1. Streak artifact limits evaluation proximally with suspected age indeterminate occlusion of bilateral vertebral artery origins with reconstitution in the mid neck. Multifocal narrowing of the remainder of the right vertebral artery and focal narrowing of the left vertebral artery at C4-C5. 2. Secretions in the trachea, placing the patient at risk for aspiration. 3. Emphysema. Critical findings discussed with Dr. Quinn Axe via telephone at 10:40 a.m. Electronically Signed   By: Margaretha Sheffield MD   On: 04/08/2021 11:29    Labs:  CBC: Recent Labs    03/27/21 0453 03/29/21 0438 04/08/21 1143 04/08/21 1431 04/09/21 0500  WBC 9.4 5.5  --  12.2* 7.3  HGB 8.9* 8.9* 9.2* 10.3* 8.6*  HCT 29.6* 28.7* 27.0* 32.4* 27.3*  PLT 300 336  --  360 266    COAGS: Recent Labs    03/21/21 2230 04/08/21 1431  INR 1.1 1.0  APTT  --  34    BMP: Recent Labs    03/29/21 0438 03/30/21 0454 04/08/21 1143 04/08/21 1907 04/09/21 0500  NA 134* 134* 140 134* 135  K 4.5 4.5 4.2 4.9 4.4  CL 98 100  --  106 105  CO2 23 28  --  21* 22  GLUCOSE 135* 129*  --  218* 130*  BUN 33* 33*  --  17 12  CALCIUM 8.9 9.1  --  8.8* 8.6*  CREATININE 1.56* 1.09  --  0.94 0.88  GFRNONAA 45* >60  --  >60 >60    LIVER FUNCTION TESTS: Recent Labs    03/23/21 0258 04/08/21 1907  BILITOT 1.0 0.6  AST 18 18  ALT 14 10  ALKPHOS 55 53  PROT 8.0 6.6  ALBUMIN 2.7* 2.6*    Assessment and Plan:  S/P Lt common carotid arteriogram with complete revascularization of occluded Inf division Lt MCA m2 branch with x1 pass with 78mx 40 mm solitaire retriever  and contact aspiration achieving a TICi 3 revascularization.  Continue ASA and plavix.   Continue care per Neurology.  Electronically Signed: WMurrell Redden PA-C 04/09/2021, 11:22 AM    I spent a total of 15 Minutes at the the patient's bedside AND on the patient's hospital floor or unit, greater than 50% of which was counseling/coordinating care for f/u cerebral intervention.

## 2021-04-09 NOTE — Evaluation (Signed)
Clinical/Bedside Swallow Evaluation Patient Details  Name: Brad Harrell MRN: [id number] Date of Birth: 1946-09-06  Today's Date: 04/09/2021 Time: SLP Start Time (ACUTE ONLY): 0930 SLP Stop Time (ACUTE ONLY): 0254 SLP Time Calculation (min) (ACUTE ONLY): 12 min  Past Medical History:  Past Medical History:  Diagnosis Date   Anemia    Iron Defficiency   Cardiomyopathy ([place])    Combined systolic and diastolic congestive heart failure (HCC)    Diabetes mellitus without complication ([place])    TYPE 2   Foley catheter in place    SINCE NOV 2017   H/O cocaine abuse ([place])    Hyperlipidemia    Hypertension    Hypothyroidism    Liver abscess    measuring 7 x 9 cm/notes 03/04/2013   Stroke ([place]) 2010   WEAK ON BOTH SIDES NOW TOTAL OF 3 STROKES    Urinary retention    Past Surgical History:  Past Surgical History:  Procedure Laterality Date   APPENDECTOMY     COLONOSCOPY N/A 03/11/2013   Procedure: COLONOSCOPY;  Surgeon: Beryle Beams, MD;  Location: [facility];  Service: Endoscopy;  Laterality: N/A;   COLONOSCOPY WITH PROPOFOL N/A 03/15/2018   Procedure: COLONOSCOPY WITH PROPOFOL;  Surgeon: Carol Ada, MD;  Location: [facility];  Service: Endoscopy;  Laterality: N/A;   CYSTOSCOPY WITH INSERTION OF UROLIFT N/A 01/04/2017   Procedure: CYSTOSCOPY WITH INSERTION OF UROLIFT x8;  Surgeon: Cleon Gustin, MD;  Location: [facility];  Service: Urology;  Laterality: N/A;   ESOPHAGOGASTRODUODENOSCOPY N/A 03/11/2013   Procedure: ESOPHAGOGASTRODUODENOSCOPY (EGD);  Surgeon: Beryle Beams, MD;  Location: [facility];  Service: Endoscopy;  Laterality: N/A;   GIVENS CAPSULE STUDY N/A 03/12/2013   Procedure: GIVENS CAPSULE STUDY;  Surgeon: Beryle Beams, MD;  Location: [facility];  Service: Endoscopy;  Laterality: N/A;   INGUINAL HERNIA REPAIR Right    IR PERCUTANEOUS ART THROMBECTOMY/INFUSION INTRACRANIAL INC DIAG ANGIO  04/08/2021   POLYPECTOMY  03/15/2018   Procedure: POLYPECTOMY;  Surgeon:  Carol Ada, MD;  Location: [facility];  Service: Endoscopy;;   SCROTAL EXPLORATION Left 03/25/2021   Procedure: LEFT SCROTUM EXPLORATION WITH ORCHIECTOMY;  Surgeon: [name], MD;  Location: [facility];  Service: Urology;  Laterality: Left;   TIBIA FRACTURE SURGERY Left    TRANSURETHRAL RESECTION OF PROSTATE N/A 03/25/2021   Procedure: CYSTOSCOPY TRANSURETHRAL RESECTION OF THE PROSTATE (TURP);  Surgeon: [name], MD;  Location: [facility];  Service: Urology;  Laterality: N/A;   HPI:  75 yo male with stroke risk factors of previous strokes, HLD, HTN, tobacco abuse, elicit drug use, and DM II. tPA was not given due to not being able to clarify his LKW. 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   Assessment / Plan / Recommendation Clinical Impression  Pt with greatly improved presentation this morning compared to yesterday. He is conversational and requesting water. Oral mech exam was WNL with noted poor dentition with many  missing teeth and broken teeth. He was seen with thin liquids via cup/straw, purees, solids, and pills taken with water via straw. He had no s/s aspiration or dysphagia. Recommend regular consistency solids and thin liquids with meds taken whole with thin liquids. No further ST for swallow function.    SLP Visit Diagnosis: Dysphagia, unspecified (R13.10)    Aspiration Risk  Mild aspiration risk    Diet Recommendation Regular;Thin liquid   Liquid Administration via: Spoon;Cup;Straw Medication Administration: Whole meds with liquid Supervision: Patient able to self  feed    Other  Recommendations Oral Care Recommendations: Oral care BID   Follow up Recommendations Other (comment) (back to short term rehab facility he came from)      Frequency and Duration min 1 x/week          Prognosis   Good     Swallow Study   General Date of Onset: 04/08/21 HPI: 75 yo male with stroke risk factors of previous strokes, HLD, HTN, tobacco abuse, elicit drug use,  and DM II. tPA was not given due to not being able to clarify his LKW. 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 Previous Swallow Assessment: CSE 2015 WNL Diet Prior to this Study: NPO Temperature Spikes Noted: No Respiratory Status: Room air History of Recent Intubation:  (during procedure only) Behavior/Cognition: Alert;Cooperative Oral Cavity Assessment: Within Functional Limits Oral Care Completed by SLP: No Oral Cavity - Dentition: Poor condition;Missing dentition Vision: Functional for self-feeding Self-Feeding Abilities: Able to feed self Patient Positioning: Upright in bed Baseline Vocal Quality: Normal Volitional Cough: Strong Volitional Swallow: Able to elicit    Oral/Motor/Sensory Function Overall Oral Motor/Sensory Function: Within functional limits   Ice Chips Ice chips: Not tested   Thin Liquid Thin Liquid: Within functional limits Presentation: Cup;Straw    Nectar Thick Nectar Thick Liquid: Not tested   Honey Thick Honey Thick Liquid: Not tested   Puree Puree: Within functional limits Presentation: Spoon   Solid     Solid: Within functional limits Presentation: [place]. Margreat Widener, M.S., CCC-SLP Speech-Language Pathologist Acute Rehabilitation Services Pager: [place] 04/09/2021,10:15 AM

## 2021-04-09 NOTE — Evaluation (Signed)
Occupational Therapy Evaluation Patient Details Name: Brad Harrell MRN: [id number] DOB: 08/27/1946 Today's Date: 04/09/2021    History of Present Illness 75 yo male with L MCA revacularization global aphasia PMH HLD HTN tobacco abuse polysubstance abuse DM2 CVA 2015 ARF CHF   Clinical Impression   PT admitted with L MCA with revascularization. Pt currently with functional limitiations due to the deficits listed below (see OT problem list). Pt with recent d/c 03/30/21 to SNF with readmission for L MCA 04/08/21. Pt with visual deficits at baseline and pt is poor historian to access changes from baseline. Pt tracking therapist in room and locates therapist with name call. Pt requires total +2 max (A) to pivot to chair with (A) to move LLE. Pt impulsive and will need close supervision for safety lines/ leads Pt will benefit from skilled OT to increase their independence and safety with adls and balance to allow discharge SNF.     Follow Up Recommendations  SNF    Equipment Recommendations  None recommended by OT    Recommendations for Other Services       Precautions / Restrictions Precautions Precautions: Fall Precaution Comments: SBP <140      Mobility Bed Mobility   Bed Mobility: Supine to Sit;Rolling Rolling: Min guard   Supine to sit: Min assist     General bed mobility comments: pt reaching with R UE and progressing out of the bed on L side. Pt pulling with R UE to scoot buttock toward edge. pt with cues to remain seated as pt attempting to stand    Transfers                      Balance     Sitting balance-Leahy Scale: Fair       Standing balance-Leahy Scale: Poor Standing balance comment: reliant on external support.                           ADL either performed or assessed with clinical judgement   ADL Overall ADL's : Needs assistance/impaired     Grooming: Wash/dry hands;Minimal assistance;Sitting   Upper Body Bathing: Minimal  assistance;Sitting           Lower Body Dressing: Maximal assistance;Bed level Lower Body Dressing Details (indicate cue type and reason): don socks Toilet Transfer: +2 for safety/equipment;+2 for physical assistance;Maximal assistance Toilet Transfer Details (indicate cue type and reason): pivot from EOB to chair           General ADL Comments: pt demonstrates decreased activation of L LE with transfer than R LE. pt needs (A) to weight shift     Vision Baseline Vision/History: Legally blind Additional Comments: difficult to fully assess. pt does visually turn toward therapist with name call and when therapist enter the room     Perception     Praxis      Pertinent Vitals/Pain Pain Assessment: No/denies pain     Hand Dominance Right   Extremity/Trunk Assessment Upper Extremity Assessment Upper Extremity Assessment: RUE deficits/detail;LUE deficits/detail RUE Deficits / Details: AROM shoulder flexion 90 degrees WFL elbow and digits RUE Sensation: decreased proprioception RUE Coordination: decreased fine motor LUE Deficits / Details: limited by aline/ block for testing but able to touch nose and fully extend elbow against gravity LUE Sensation: decreased proprioception LUE Coordination: decreased fine motor;decreased gross motor   Lower Extremity Assessment Lower Extremity Assessment: Defer to PT evaluation   Cervical / Trunk Assessment  Cervical / Trunk Assessment: Normal   Communication Communication Communication: Expressive difficulties   Cognition Arousal/Alertness: Awake/alert Behavior During Therapy: Impulsive Overall Cognitive Status: Impaired/Different from baseline Area of Impairment: Orientation;Attention;Memory;Following commands;Safety/judgement;Awareness;Problem solving                 Orientation Level: Disoriented to;Place;Time;Situation (at home april) Current Attention Level: Sustained Memory: Decreased recall of precautions;Decreased  short-term memory Following Commands: Follows one step commands inconsistently;Follows one step commands with increased time Safety/Judgement: Decreased awareness of safety;Decreased awareness of deficits Awareness: Intellectual Problem Solving: Slow processing;Difficulty sequencing General Comments: pt reports being at home and the month as april. pt with not recall of recent d/c to SNF.pt needs cues throughout session for safety. pt with cues for lines/ leads to avoid pulling   General Comments  VSS supine BP 121/57 sitting 127/82 and in chair 111/61 (77) pt denies any dizziness    Exercises     Shoulder Instructions      Home Living Family/patient expects to be discharged to:: Skilled nursing facility ([facility])   Available Help at Discharge: Family Type of Home: House       Home Layout: One level                   Additional Comments: daughter Brad Harrell  Lives With: Daughter    Prior Functioning/Environment                   OT Problem List: Decreased strength;Decreased activity tolerance;Impaired balance (sitting and/or standing);Decreased knowledge of use of DME or AE;Decreased coordination;Decreased cognition;Decreased safety awareness;Decreased knowledge of precautions;Cardiopulmonary status limiting activity;Impaired UE functional use      OT Treatment/Interventions: Self-care/ADL training;DME and/or AE instruction;Therapeutic activities;Patient/family education;Balance training;Therapeutic exercise;Neuromuscular education;Cognitive remediation/compensation    OT Goals(Current goals can be found in the care plan section) Acute Rehab OT Goals Patient Stated Goal: none stated by patient OT Goal Formulation: Patient unable to participate in goal setting Time For Goal Achievement: 04/23/21 Potential to Achieve Goals: Good  OT Frequency: Min 2X/week   Barriers to D/C: Decreased caregiver support  from SNF after recent d/c there on 03/30/21        Co-evaluation PT/OT/SLP Co-Evaluation/Treatment: Yes Reason for Co-Treatment: Complexity of the patient's impairments (multi-system involvement);Necessary to address cognition/behavior during functional activity;For patient/therapist safety;To address functional/ADL transfers   OT goals addressed during session: ADL's and self-care;Proper use of Adaptive equipment and DME;Strengthening/ROM      AM-PAC OT "6 Clicks" Daily Activity     Outcome Measure Help from another person eating meals?: A Little Help from another person taking care of personal grooming?: A Little Help from another person toileting, which includes using toliet, bedpan, or urinal?: A Lot Help from another person bathing (including washing, rinsing, drying)?: A Lot Help from another person to put on and taking off regular upper body clothing?: A Little Help from another person to put on and taking off regular lower body clothing?: A Lot 6 Click Score: 15   End of Session Equipment Utilized During Treatment: Gait belt Nurse Communication: Mobility status;Precautions  Activity Tolerance: Patient tolerated treatment well Patient left: in chair;with call bell/phone within reach;with chair alarm set  OT Visit Diagnosis: Unsteadiness on feet (R26.81);Other abnormalities of gait and mobility (R26.89);Muscle weakness (generalized) (M62.81)                Time: 1051-1110 OT Time Calculation (min): 19 min Charges:  OT General Charges $OT Visit: 1 Visit OT Evaluation $OT Eval Moderate Complexity: 1  Shawnee Knapp, OTR/L  Acute Rehabilitation Services Pager: [phone] Office: [phone] .   Jeri Modena 04/09/2021, 11:21 AM

## 2021-04-09 NOTE — Evaluation (Signed)
Physical Therapy Evaluation Patient Details Name: Brad Harrell MRN: [id number] DOB: [birth year] Today's Date: 04/09/2021   History of Present Illness  75 yo male presented 6/10 with R-sided weakness and global aphasia. Pt found to have L MCA occluded, s/p revacularization 6/10. PMH HLD HTN tobacco abuse polysubstance abuse DM2 CVA 2015 ARF CHF   Clinical Impression  Pt presents with condition above and deficits mentioned below, see PT Problem List. Pt with recent d/c 03/30/21 to SNF with readmission for L MCA 04/08/21. Pt with visual deficits at baseline and pt is poor historian to access changes from baseline. Pt tracking therapist in room and locates therapist with name call. Pt is currently requiring up to minA for bed mobility, mod-maxAx2 for transfers, and maxAx2 to take a few small, shuffling steps as pt did not initiate lifting or advancing his L leg, needing physical assistance to do so. Pt reports his prior CVA resulted in residual L-sided weakness, which likely contributes to this difficulty with taking steps with his L. However, his R leg tested weaker with MMT compared to his L. Pt is impulsive and will need close supervision for safety lines/ leads. Pt would benefit from follow-up with skilled PT at a SNF to maximize his safety and independence with all functional mobility. Will continue to follow acutely.    Follow Up Recommendations SNF;Supervision/Assistance - 24 hour    Equipment Recommendations  None recommended by PT (defer to next venue of care)    Recommendations for Other Services       Precautions / Restrictions Precautions Precautions: Fall Precaution Comments: SBP <140; A-line Restrictions Weight Bearing Restrictions: No      Mobility  Bed Mobility Overal bed mobility: Needs Assistance Bed Mobility: Supine to Sit;Rolling Rolling: Min guard   Supine to sit: Min assist     General bed mobility comments: pt reaching with R UE and progressing out of the bed on  L side. Pt pulling with R UE to scoot buttock toward edge. pt with cues to remain seated as pt attempting to stand    Transfers Overall transfer level: Needs assistance Equipment used: 2 person hand held assist Transfers: Sit to/from [facility] Sit to Stand: Mod assist;+2 physical assistance;+2 safety/equipment Stand pivot transfers: Max assist;+2 physical assistance;+2 safety/equipment       General transfer comment: Pt impulsive to stand, needing modAx2 for safety and to steady. MaxAx2 to pivot and take small shuffling steps to L, needing assistance to lift and advance L leg.  Ambulation/Gait Ambulation/Gait assistance: Max assist;+2 physical assistance;+2 safety/equipment Gait Distance (Feet): 2 Feet Assistive device: 2 person hand held assist Gait Pattern/deviations: Step-to pattern;Decreased step length - left;Decreased stride length;Shuffle;Trunk flexed;Narrow base of support Gait velocity: decreased Gait velocity interpretation: <1.31 ft/sec, indicative of household ambulator General Gait Details: Pt with narrow BOS and taking small shuffling steps to L, needing physical assistance to lift and advance his L leg. MaxAx2 for steadying and sequencing.  Stairs            Wheelchair Mobility    Modified Rankin (Stroke Patients Only) Modified Rankin (Stroke Patients Only) Pre-Morbid Rankin Score: Moderately severe disability Modified Rankin: Moderately severe disability     Balance Overall balance assessment: Needs assistance Sitting-balance support: No upper extremity supported;Feet supported Sitting balance-Leahy Scale: Fair Sitting balance - Comments: Min guard for safety as pt is impulsive.   Standing balance support: Bilateral upper extremity supported;During functional activity Standing balance-Leahy Scale: Poor Standing balance comment: reliant on external and UE  support.                             Pertinent Vitals/Pain Pain  Assessment: No/denies pain    Home Living Family/patient expects to be discharged to:: Skilled nursing facility ([facility])             Home Layout: One level   Additional Comments: daughter [name]    Prior Function Level of Independence: Needs assistance   Gait / Transfers Assistance Needed: Likely needed assistance for mobility most recently at SNF. Prior to that he was mod I.           Hand Dominance   Dominant Hand: Right    Extremity/Trunk Assessment   Upper Extremity Assessment Upper Extremity Assessment: Defer to OT evaluation    Lower Extremity Assessment Lower Extremity Assessment: RLE deficits/detail;LLE deficits/detail RLE Deficits / Details: MMT scores of grossly 4- to 4 RLE Sensation: decreased proprioception RLE Coordination: decreased gross motor;decreased fine motor LLE Deficits / Details: MMT scores of grossly 4+ LLE Sensation: decreased proprioception LLE Coordination: decreased gross motor;decreased fine motor    Cervical / Trunk Assessment Cervical / Trunk Assessment: Normal  Communication   Communication: Expressive difficulties  Cognition Arousal/Alertness: Awake/alert Behavior During Therapy: Impulsive Overall Cognitive Status: Impaired/Different from baseline Area of Impairment: Orientation;Attention;Memory;Following commands;Safety/judgement;Awareness;Problem solving                 Orientation Level: Disoriented to;Place;Time;Situation (at home april) Current Attention Level: Sustained Memory: Decreased recall of precautions;Decreased short-term memory Following Commands: Follows one step commands inconsistently;Follows one step commands with increased time Safety/Judgement: Decreased awareness of safety;Decreased awareness of deficits Awareness: Intellectual Problem Solving: Slow processing;Difficulty sequencing General Comments: pt reports being at home and the month as april. Unable to recall year. pt with no recall of  recent d/c to SNF.pt needs cues throughout session for safety. pt with cues for lines/ leads to avoid pulling      General Comments General comments (skin integrity, edema, etc.): VSS supine BP 121/57 sitting 127/82 and in chair 111/61 (77) pt denies any dizziness    Exercises     Assessment/Plan    PT Assessment Patient needs continued PT services  PT Problem List Decreased strength;Decreased mobility;Decreased activity tolerance;Decreased knowledge of use of DME;Decreased balance;Decreased range of motion;Decreased coordination;Decreased cognition;Decreased safety awareness;Impaired sensation       PT Treatment Interventions DME instruction;Gait training;Functional mobility training;Therapeutic activities;Therapeutic exercise;Neuromuscular re-education;Balance training;Cognitive remediation;Patient/family education;Wheelchair mobility training    PT Goals (Current goals can be found in the Care Plan section)  Acute Rehab PT Goals Patient Stated Goal: none stated by patient PT Goal Formulation: With patient Time For Goal Achievement: 04/23/21 Potential to Achieve Goals: Good    Frequency Min 3X/week   Barriers to discharge        Co-evaluation PT/OT/SLP Co-Evaluation/Treatment: Yes Reason for Co-Treatment: Complexity of the patient's impairments (multi-system involvement);Necessary to address cognition/behavior during functional activity;For patient/therapist safety;To address functional/ADL transfers PT goals addressed during session: Mobility/safety with mobility;Balance         AM-PAC PT "6 Clicks" Mobility  Outcome Measure Help needed turning from your back to your side while in a flat bed without using bedrails?: A Little Help needed moving from lying on your back to sitting on the side of a flat bed without using bedrails?: A Little Help needed moving to and from a bed to a chair (including a wheelchair)?: A Lot Help needed standing up from  a chair using your arms  (e.g., wheelchair or bedside chair)?: A Lot Help needed to walk in hospital room?: Total Help needed climbing 3-5 steps with a railing? : Total 6 Click Score: 12    End of Session Equipment Utilized During Treatment: Gait belt Activity Tolerance: Patient tolerated treatment well Patient left: in chair;with call bell/phone within reach;with chair alarm set Nurse Communication: Mobility status PT Visit Diagnosis: Unsteadiness on feet (R26.81);Other abnormalities of gait and mobility (R26.89);Muscle weakness (generalized) (M62.81);Difficulty in walking, not elsewhere classified (R26.2);Other symptoms and signs involving the nervous system (R29.898)    Time: 1051-1110 PT Time Calculation (min) (ACUTE ONLY): 19 min   Charges:   PT Evaluation $PT Eval Moderate Complexity: 1 Mod          Moishe Spice, PT, DPT Acute Rehabilitation Services  Pager: [phone] Office: [phone]   Orvan Falconer 04/09/2021, 3:51 PM

## 2021-04-09 NOTE — TOC CAGE-AID Note (Signed)
Transition of Care [facility]) - CAGE-AID Screening   Patient Details  Name: Brad Harrell MRN: [id number] Date of Birth: [birth year]  Transition of Care [facility]) CM/SW Contact:    Benard Halsted, LCSW Phone Number: 04/09/2021, 8:38 AM   Clinical Narrative: Patient is disoriented and unable to participate in screening.    CAGE-AID Screening: Substance Abuse Screening unable to be completed due to: : Patient unable to participate

## 2021-04-09 NOTE — Evaluation (Signed)
Speech Language Pathology Evaluation Patient Details Name: Brad Harrell MRN: [id number] DOB: 03-24-1946 Today's Date: 04/09/2021 Time: 0910-0930 SLP Time Calculation (min) (ACUTE ONLY): 20 min  Problem List:  Patient Active Problem List   Diagnosis Date Noted   Acute ischemic left MCA stroke ([place]) 04/08/2021   Stroke ([place]) 04/08/2021   Middle cerebral artery embolism, 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   Malnutrition of moderate degree 03/24/2021   Scrotal abscess 03/22/2021   Renal insufficiency 03/22/2021   Hyperkalemia 03/22/2021   Chronic combined systolic and diastolic CHF (congestive heart failure) ([place]) 03/22/2021   Hypothyroidism 03/22/2021   Hypothermia 19/62/2297   Metabolic acidosis 98/92/1194   Elevated lactic acid level 08/21/2016   Combined systolic and diastolic congestive heart failure ([place])    Diabetes mellitus, new onset ([place]) 07/01/2014   Left arm weakness 07/01/2014   History of CVA  07/01/2014   Urinary tract infection 07/01/2014   Thrombocytopenia ([place]) 07/01/2014   DKA (diabetic ketoacidoses) 06/30/2014   Physical deconditioning 03/13/2013   Anemia, iron deficiency 03/08/2013   Bacteremia 03/06/2013   History of Liver abscess 03/05/2013   Encephalopathy, metabolic 17/40/8144   Anemia 81/85/6314   Complicated UTI (urinary tract infection) 08/15/2012   SIRS (systemic inflammatory response syndrome) ([place]) 08/15/2012   ARF (acute renal failure) ([place]) 08/15/2012   Hypertension    Urinary retention    History of ischemic stroke    Hyperlipidemia    Cardiomyopathy ([place])    H/O cocaine abuse ([place])    Past Medical History:  Past Medical History:  Diagnosis Date   Anemia    Iron Defficiency   Cardiomyopathy ([place])    Combined systolic and diastolic congestive heart failure ([place])    Diabetes mellitus without complication ([place])    TYPE 2   Foley catheter in place    SINCE NOV 2017   H/O cocaine abuse ([place])    Hyperlipidemia    Hypertension    Hypothyroidism     Liver abscess    measuring 7 x 9 cm/notes 03/04/2013   Stroke ([place]) 2010   WEAK ON BOTH SIDES NOW TOTAL OF 3 STROKES    Urinary retention    Past Surgical History:  Past Surgical History:  Procedure Laterality Date   APPENDECTOMY     COLONOSCOPY N/A 03/11/2013   Procedure: COLONOSCOPY;  Surgeon: Beryle Beams, MD;  Location: [facility] ENDOSCOPY;  Service: Endoscopy;  Laterality: N/A;   COLONOSCOPY WITH PROPOFOL N/A 03/15/2018   Procedure: COLONOSCOPY WITH PROPOFOL;  Surgeon: Carol Ada, MD;  Location: [facility] ENDOSCOPY;  Service: Endoscopy;  Laterality: N/A;   CYSTOSCOPY WITH INSERTION OF UROLIFT N/A 01/04/2017   Procedure: CYSTOSCOPY WITH INSERTION OF UROLIFT x8;  Surgeon: Cleon Gustin, MD;  Location: [facility] ORS;  Service: Urology;  Laterality: N/A;   ESOPHAGOGASTRODUODENOSCOPY N/A 03/11/2013   Procedure: ESOPHAGOGASTRODUODENOSCOPY (EGD);  Surgeon: Beryle Beams, MD;  Location[facility] ENDOSCOPY;  Service: Endoscopy;  Laterality: N/A;   GIVENS CAPSULE STUDY N/A 03/12/2013   Procedure: GIVENS CAPSULE STUDY;  Surgeon: Beryle Beams, MD;  Location: [place];  Service: Endoscopy;  Laterality: N/A;   INGUINAL HERNIA REPAIR Right    IR PERCUTANEOUS ART THROMBECTOMY/INFUSION INTRACRANIAL INC DIAG ANGIO  04/08/2021   POLYPECTOMY  03/15/2018   Procedure: POLYPECTOMY;  Surgeon: Carol Ada, MD;  Location: [facility] ENDOSCOPY;  Service: Endoscopy;;   SCROTAL EXPLORATION Left 03/25/2021   Procedure: LEFT SCROTUM EXPLORATION WITH ORCHIECTOMY;  Surgeon: [name], MD;  Location: [facility] ORS;  Service: Urology;  Laterality: Left;   TIBIA  FRACTURE SURGERY Left    TRANSURETHRAL RESECTION OF PROSTATE N/A 03/25/2021   Procedure: CYSTOSCOPY TRANSURETHRAL RESECTION OF THE PROSTATE (TURP);  Surgeon: [name], MD;  Location: [facility] ORS;  Service: Urology;  Laterality: N/A;   HPI:  75 yo male with stroke risk factors of previous strokes, HLD, HTN, tobacco abuse, elicit drug use, and DM II. tPA was not given due to not being able to  clarify his LKW. 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   Assessment / Plan / Recommendation Clinical Impression  Mr. Giraud presented to ED with "global aphasia" per ED provider. Today, pt is conversational despite flat affect. Confrontation naming was WNL. Generative naming was impaired, but appeared to be related to slowed processing versus language impairment. Pt was disoriented to time (month/year/time of day) and to situation, but was oriented to self and place. He provided a decent amount of background information that was confirmed in chart, including that his daughter, Brad Harrell, lives with him and she handles all IADL tasks (money mgmt, med [employer] representative, driving). He does cook at baseline; small and easy meals per his report. Pt has baseline visual impairment of R eye and was unable to read clock. Memory was impaired with pt unable to recall 4 items after two minutes (he did code them effectively). He told SLP this is different for him and he usually knows the year and month as well. He provided his DOB and knew his age, but was unable to do the calculation to come up with the present year; he stated "2002, no 2012."  Despite known baseline cognitive deficits, pt appears to have a change in cognition with problem solving, memory, and processing speeds. No speech/language impairment noted based on this evaluation. Recommend SLP follow up to assess for further need of services for cognition.    SLP Assessment  SLP Recommendation/Assessment: Patient needs continued Speech Lanaguage Pathology Services SLP Visit Diagnosis: Cognitive communication deficit (R41.841)    Follow Up Recommendations  Other (comment) (back to short term rehab facility he came from)    Frequency and Duration min 1 x/week  1 week      SLP Evaluation Cognition  Overall Cognitive Status: Impaired/Different from baseline Orientation Level: Oriented to person;Oriented to place;Disoriented to time;Disoriented to  situation Attention: Focused Focused Attention: Appears intact Memory: Impaired Memory Impairment: Retrieval deficit Awareness: Appears intact Problem Solving: Impaired Problem Solving Impairment: Verbal complex Executive Function: Decision Making Decision Making: Impaired Decision Making Impairment: Verbal complex;Functional complex Safety/Judgment: Appears intact       Comprehension  Auditory Comprehension Overall Auditory Comprehension: Appears within functional limits for tasks assessed Yes/No Questions: Within Functional Limits Commands: Within Functional Limits Conversation: Simple    Expression Expression Primary Mode of Expression: Verbal Verbal Expression Overall Verbal Expression: Appears within functional limits for tasks assessed Initiation: No impairment Automatic Speech: Name;Social Response Level of Generative/Spontaneous Verbalization: Sentence Repetition: No impairment Naming: No impairment Pragmatics: Impairment Impairments: Abnormal affect Written Expression Dominant Hand: Right   Oral / Motor  Motor Speech Overall Motor Speech: Appears within functional limits for tasks assessed Respiration: Within functional limits Phonation: Normal Resonance: Within functional limits Intelligibility: Intelligible            Beata Beason P. Shatasha Lambing, M.S., CCC-SLP Speech-Language Pathologist Acute Rehabilitation Services Pager: [place] 04/09/2021, 10:01 AM

## 2021-04-10 ENCOUNTER — Inpatient Hospital Stay (HOSPITAL_COMMUNITY): Payer: Medicare Other

## 2021-04-10 LAB — GLUCOSE, CAPILLARY
Glucose-Capillary: 96 mg/dL (ref 70–99)
Glucose-Capillary: 91 mg/dL (ref 70–99)
Glucose-Capillary: 156 mg/dL — ABNORMAL HIGH (ref 70–99)
Glucose-Capillary: 112 mg/dL — ABNORMAL HIGH (ref 70–99)

## 2021-04-10 MED ORDER — ASPIRIN EC 325 MG PO TBEC
325.0000 mg | DELAYED_RELEASE_TABLET | Freq: Every day | ORAL | Status: DC
  Administered 2021-04-11 – 2021-04-15 (×5): 325 mg via ORAL
  Filled 2021-04-10 (×5): qty 1

## 2021-04-10 MED ORDER — PANTOPRAZOLE SODIUM 40 MG PO TBEC
40.0000 mg | DELAYED_RELEASE_TABLET | Freq: Every day | ORAL | Status: DC
  Administered 2021-04-10 – 2021-04-14 (×5): 40 mg via ORAL
  Filled 2021-04-10 (×5): qty 1

## 2021-04-10 NOTE — Progress Notes (Signed)
PHARMACIST - PHYSICIAN COMMUNICATION  DR:   Theda Sers  CONCERNING: IV to Oral Route Change Policy  RECOMMENDATION: This patient is receiving Protonix by the intravenous route.  Based on criteria approved by the Pharmacy and Therapeutics Committee, the intravenous medication(s) is/are being converted to the equivalent oral dose form(s).   DESCRIPTION: These criteria include: The patient is eating (either orally or via tube) and/or has been taking other orally administered medications for a least 24 hours The patient has no evidence of active gastrointestinal bleeding or impaired GI absorption (gastrectomy, short bowel, patient on TNA or NPO).  If you have questions about this conversion, please contact the Pharmacy Department  []   [phone] )  [facility] []   [phone] )  [facility] [x]   [phone] )  [facility] []   [phone] )  [facility] []   [phone] )  [facility]

## 2021-04-10 NOTE — Progress Notes (Signed)
STROKE TEAM PROGRESS NOTE   INTERVAL HISTORY  No overnight events or new complaints. The patient was resting comfortably in chair and woke to voice. He is able to have a simple conversation and follows commands consistent.  Vitals:   04/10/21 0307 04/10/21 0400 04/10/21 0500 04/10/21 0600  BP: 128/61 (!) 114/54 109/67 105/60  Pulse: 70 79 75 85  Resp: 16 15 14 14   Temp:  (!) 97.5 F (36.4 C)    TempSrc:  Oral    SpO2: 97% 98% 97% 97%  Weight:       CBC:  Recent Labs  Lab 04/08/21 1431 04/09/21 0500  WBC 12.2* 7.3  NEUTROABS 11.3* 4.5  HGB 10.3* 8.6*  HCT 32.4* 27.3*  MCV 82.4 83.7  PLT 360 341   Basic Metabolic Panel:  Recent Labs  Lab 04/08/21 1907 04/09/21 0500  NA 134* 135  K 4.9 4.4  CL 106 105  CO2 21* 22  GLUCOSE 218* 130*  BUN 17 12  CREATININE 0.94 0.88  CALCIUM 8.8* 8.6*   Lipid Panel:  Recent Labs  Lab 04/09/21 0500  CHOL 108  TRIG 183*  HDL 18*  CHOLHDL 6.0  VLDL 37  LDLCALC 53   HgbA1c: No results for input(s): HGBA1C in the last 168 hours. Urine Drug Screen:  Recent Labs  Lab 04/08/21 1431  LABOPIA NONE DETECTED  COCAINSCRNUR NONE DETECTED  LABBENZ NONE DETECTED  AMPHETMU NONE DETECTED  THCU NONE DETECTED  LABBARB NONE DETECTED    Alcohol Level  Recent Labs  Lab 04/08/21 1431  ETH <10    IMAGING past 24 hours No results found.  Echocardiogram 03/24/2021:   FINDINGS   Left Ventricle: ejection fraction, by estimation, is 25 to 30%. Left ventricular ejection fraction by 2D MOD biplane is 26.6 %.  The left ventricle has severely decreased function. The left ventricle  demonstrates global hypokinesis. The  left ventricular internal cavity size was normal in size. There is no left  ventricular hypertrophy. Indeterminate diastolic filling due to E-A  fusion.  Right Ventricle: The right ventricular size is normal. No increase in  right ventricular wall thickness. Right ventricular systolic function is  mildly reduced. Tricuspid  regurgitation signal is inadequate for assessing  PA pressure.  IAS/Shunts: The atrial septum is grossly normal.   MRI brain - Patchy acute cortical infarction in the left MCA distribution. Punctate right frontal parietal cortex infarct. Advanced chronic small vessel disease.   PHYSICAL EXAM The patient was sleeping comfortably in chair and woke to voice. He was alert and oriented to self and place, able to have a simple conversation and follows commands consistent. Mild decreased strength in left arm and leg which may be residual from prior stroke. Right leg seemed to be weaker.  ASSESSMENT/PLAN [name] is a 75 y.o. male residing in nursing home Hx HLD, HTN, tobacco abuse, and DM II, prior strokes found to be globally aphasic NIHSS 17 and LKW clear and therefore not a candidate for tPA found to have left distal M2 MCA branch occlusion noted on CTA and patient was taken emergently to IR after consent by daughter. S/P thrombectomy with TICI 3 revascularization now markedly improved. His aphasia seems to have resolved.  MRI only shows small patchy areas of stroke in the left MCA distribution without petechial hemorrhage. He has significant intracranial stenotic disease and he would benefit from aspirin 325mg  and clopidogrel 75mg  daily for 90 days.  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 Code Stroke CT head No acute abnormality.  Atrophy. ASPECTS 10. Remote inferior left cerebellar infarct.    CTA head Focal short-segment opacification of a left distal M2 MCA branch with occlusion, moderate right intradural and right P2 PCA stenosis. CTA neck Streak artifact limits evaluation proximally with suspected age indeterminate occlusion of bilateral vertebral artery origins with reconstitution in the mid neck. Multifocal narrowing of the remainder of the right vertebral artery and focal narrowing of the left vertebral artery at C4-C5.  CTP core of 1ml  v mismatch of 2ml . MRI  brain - Patchy acute cortical infarction in the left MCA distribution. Punctate right frontal parietal cortex infarct.  Echocardiogram showed EF 25-30% Global hypokinesis, no LVT or IAS LDL 53 HgbA1c 5.3 VTE prophylaxis - Enoxaparin    Diet   Diet regular Room service appropriate? Yes; Fluid consistency: Thin   Increased dose of aspirin to 325mg  daily and continued clopidogrel 75mg  daily. Continue for for 90 day and then discontinue clopidogrel and continue aspirin 325mg . Disposition: to home.  Hypertension Continue PO medications with blood pressure goal <140/90. Long-term BP goal normotensive  Hyperlipidemia LDL 53, goal < 70 High intensity statin atorvastatin 20mg  Continue statin at discharge  Diabetes type II Controlled HgbA1c 5.3, goal < 7.0 CBGs Recent Labs    04/09/21 2330 04/10/21 0338 04/10/21 0741  GLUCAP 88 96 91   SSI  Chronic systolic HF - TTE - Pending   Hypothyroidism - Continue home synthroid 1037mcg daily   Urinary retention 2/2 BPH - Continue home finasteride  Hospital day # 2  Discussed stroke risk factors and aggressive medical management as well as the medication plan moving forward. He said he was on Asprin 81mg  daily and that he lives with his daughter who administers his medications. He is stable for transfer to the floor.   Brad Sandhoff, MD Stroke Neurology Page: [phone]   To contact Stroke Continuity provider, please refer to [email or web address] After hours, contact General Neurology

## 2021-04-10 NOTE — Progress Notes (Signed)
Patient off the unit for MRI.

## 2021-04-11 ENCOUNTER — Encounter (HOSPITAL_COMMUNITY): Payer: Self-pay | Admitting: Radiology

## 2021-04-11 ENCOUNTER — Inpatient Hospital Stay (HOSPITAL_COMMUNITY): Payer: Medicare Other

## 2021-04-11 DIAGNOSIS — I63132 Cerebral infarction due to embolism of left carotid artery: Secondary | ICD-10-CM

## 2021-04-11 DIAGNOSIS — I63512 Cerebral infarction due to unspecified occlusion or stenosis of left middle cerebral artery: Secondary | ICD-10-CM

## 2021-04-11 LAB — GLUCOSE, CAPILLARY
Glucose-Capillary: 101 mg/dL — ABNORMAL HIGH (ref 70–99)
Glucose-Capillary: 105 mg/dL — ABNORMAL HIGH (ref 70–99)
Glucose-Capillary: 113 mg/dL — ABNORMAL HIGH (ref 70–99)
Glucose-Capillary: 135 mg/dL — ABNORMAL HIGH (ref 70–99)
Glucose-Capillary: 176 mg/dL — ABNORMAL HIGH (ref 70–99)
Glucose-Capillary: 81 mg/dL (ref 70–99)

## 2021-04-11 LAB — BASIC METABOLIC PANEL
Anion gap: 7 (ref 5–15)
BUN: 12 mg/dL (ref 8–23)
CO2: 25 mmol/L (ref 22–32)
Calcium: 8.7 mg/dL — ABNORMAL LOW (ref 8.9–10.3)
Chloride: 106 mmol/L (ref 98–111)
Creatinine, Ser: 0.98 mg/dL (ref 0.61–1.24)
GFR, Estimated: >60 mL/min (ref >60)
Glucose, Bld: 102 mg/dL — ABNORMAL HIGH (ref 70–99)
Potassium: 4.2 mmol/L (ref 3.5–5.1)
Sodium: 138 mmol/L (ref 135–145)

## 2021-04-11 LAB — ECHOCARDIOGRAM COMPLETE BUBBLE STUDY
Area-P 1/2: 1.94 cm2
Calc EF: 33.5 %
S' Lateral: 4.5 cm
Single Plane A2C EF: 43.9 %
Single Plane A4C EF: 26.1 %

## 2021-04-11 LAB — CBC
HCT: 26.3 % — ABNORMAL LOW (ref 39.0–52.0)
Hemoglobin: 8.1 g/dL — ABNORMAL LOW (ref 13.0–17.0)
MCH: 25.5 pg — ABNORMAL LOW (ref 26.0–34.0)
MCHC: 30.8 g/dL (ref 30.0–36.0)
MCV: 82.7 fL (ref 80.0–100.0)
Platelets: 207 10*3/uL (ref 150–400)
RBC: 3.18 MIL/uL — ABNORMAL LOW (ref 4.22–5.81)
RDW: 18.8 % — ABNORMAL HIGH (ref 11.5–15.5)
WBC: 3.7 10*3/uL — ABNORMAL LOW (ref 4.0–10.5)
nRBC: 0 % (ref 0.0–0.2)

## 2021-04-11 NOTE — Progress Notes (Signed)
  Echocardiogram 2D Echocardiogram has been performed.  Brad Harrell 04/11/2021, 1:55 PM

## 2021-04-11 NOTE — Progress Notes (Signed)
Physical Therapy Treatment Patient Details Name: Brad Harrell MRN: [id number] DOB: 07-12-1946 Today's Date: 04/11/2021    History of Present Illness 75 yo male presented 6/10 with R-sided weakness and global aphasia. Pt found to have L MCA occluded, s/p revacularization 6/10. PMH HLD HTN tobacco abuse polysubstance abuse DM2 CVA 2015 ARF CHF    PT Comments    Pt still oriented to self and place only, but progressing towards mobility goals. Able to perform transfers and room ambulation with a walker and min assist. No manual assist required today for progression of left lower extremity during ambulation, but noted decreased clearance and shuffle with fatigue. Further distance limited due to tachycardia; HR 84-155 bpm. Pt presents as a high fall risk based on decreased gait speed and safety awareness. Continue to recommend SNF for ongoing Physical Therapy.     Follow Up Recommendations  SNF;Supervision/Assistance - 24 hour     Equipment Recommendations  None recommended by PT (defer to next venue of care)    Recommendations for Other Services       Precautions / Restrictions Precautions Precautions: Fall Precaution Comments: Goal BP <140/90, watch HR Restrictions Weight Bearing Restrictions: No    Mobility  Bed Mobility Overal bed mobility: Needs Assistance Bed Mobility: Supine to Sit     Supine to sit: Min assist     General bed mobility comments: Pt seeking handheld assist to pull trunk to upright    Transfers Overall transfer level: Needs assistance Equipment used: Rolling walker (2 wheeled) Transfers: Sit to/from Stand Sit to Stand: Min assist         General transfer comment: minA to rise and steady to walker from edge of bed and toilet  Ambulation/Gait Ambulation/Gait assistance: +2 safety/equipment;Min assist Gait Distance (Feet): 30 Feet (20", 10")   Gait Pattern/deviations: Step-to pattern;Decreased step length - left;Decreased stride  length;Shuffle;Trunk flexed;Narrow base of support Gait velocity: decreased Gait velocity interpretation: <1.8 ft/sec, indicate of risk for recurrent falls General Gait Details: Cues for keeping feet on inside of walker, minA for balance, increased trunk flexion and decreased L foot clearance with fatigue. Ambulated 20 ft to bathroom and then 10 ft to recliner.   Stairs             Wheelchair Mobility    Modified Rankin (Stroke Patients Only) Modified Rankin (Stroke Patients Only) Pre-Morbid Rankin Score: Moderately severe disability Modified Rankin: Moderately severe disability     Balance Overall balance assessment: Needs assistance Sitting-balance support: No upper extremity supported;Feet supported Sitting balance-Leahy Scale: Fair     Standing balance support: Bilateral upper extremity supported;During functional activity Standing balance-Leahy Scale: Poor Standing balance comment: reliant on external and UE support.                            Cognition Arousal/Alertness: Awake/alert Behavior During Therapy: Impulsive Overall Cognitive Status: Impaired/Different from baseline Area of Impairment: Orientation;Attention;Memory;Following commands;Safety/judgement;Awareness;Problem solving                 Orientation Level: Disoriented to;Time;Situation Current Attention Level: Sustained Memory: Decreased recall of precautions;Decreased short-term memory Following Commands: Follows one step commands inconsistently;Follows one step commands with increased time Safety/Judgement: Decreased awareness of safety;Decreased awareness of deficits Awareness: Intellectual Problem Solving: Slow processing;Difficulty sequencing General Comments: Pt oriented to self and place; able to recall he had a stroke at end of session. decreased recall of staff members and their roles.      Exercises  General Comments        Pertinent Vitals/Pain Pain  Assessment: No/denies pain    Home Living                      Prior Function            PT Goals (current goals can now be found in the care plan section) Acute Rehab PT Goals Patient Stated Goal: none stated by patient PT Goal Formulation: With patient Time For Goal Achievement: 04/23/21 Potential to Achieve Goals: Good Progress towards PT goals: Progressing toward goals    Frequency    Min 3X/week      PT Plan Current plan remains appropriate    Co-evaluation   Reason for Co-Treatment: For patient/therapist safety;To address functional/ADL transfers PT goals addressed during session: Mobility/safety with mobility        AM-PAC PT "6 Clicks" Mobility   Outcome Measure  Help needed turning from your back to your side while in a flat bed without using bedrails?: A Little Help needed moving from lying on your back to sitting on the side of a flat bed without using bedrails?: A Little Help needed moving to and from a bed to a chair (including a wheelchair)?: A Little Help needed standing up from a chair using your arms (e.g., wheelchair or bedside chair)?: A Little Help needed to walk in hospital room?: A Little Help needed climbing 3-5 steps with a railing? : A Lot 6 Click Score: 17    End of Session Equipment Utilized During Treatment: Gait belt Activity Tolerance: Patient tolerated treatment well Patient left: in chair;with call bell/phone within reach;with chair alarm set Nurse Communication: Mobility status PT Visit Diagnosis: Unsteadiness on feet (R26.81);Other abnormalities of gait and mobility (R26.89);Muscle weakness (generalized) (M62.81);Difficulty in walking, not elsewhere classified (R26.2);Other symptoms and signs involving the nervous system (R29.898)     Time: 1000-1023 PT Time Calculation (min) (ACUTE ONLY): 23 min  Charges:  $Gait Training: 8-22 mins                    Wyona Almas, PT, DPT Acute Rehabilitation Services Pager  [phone] Office [phone]    Deno Etienne 04/11/2021, 11:00 AM

## 2021-04-11 NOTE — Progress Notes (Signed)
Occupational Therapy Treatment Patient Details Name: Brad Harrell MRN: [id number] DOB: Sep 07, 1946 Today's Date: 04/11/2021    History of present illness 75 yo male presented 6/10 with R-sided weakness and global aphasia. Pt found to have L MCA occluded, s/p revacularization 6/10. PMH HLD HTN tobacco abuse polysubstance abuse DM2 CVA 2015 ARF CHF   OT comments  Pt progressed to bathroom transfer level this session min (A) with Rw and mod vcs. Pt with decreased cognition and sequence with adls. Recommendation for SNf at this time.   Follow Up Recommendations  SNF    Equipment Recommendations  None recommended by OT    Recommendations for Other Services      Precautions / Restrictions Precautions Precautions: Fall Precaution Comments: Goal BP <140/90, watch HR Restrictions Weight Bearing Restrictions: No       Mobility Bed Mobility Overal bed mobility: Needs Assistance Bed Mobility: Supine to Sit     Supine to sit: Min assist     General bed mobility comments: Pt seeking handheld assist to pull trunk to upright    Transfers Overall transfer level: Needs assistance Equipment used: Rolling walker (2 wheeled) Transfers: Sit to/from Stand Sit to Stand: Min assist         General transfer comment: minA to rise and steady to walker from edge of bed and toilet    Balance Overall balance assessment: Needs assistance Sitting-balance support: No upper extremity supported;Feet supported Sitting balance-Leahy Scale: Fair     Standing balance support: Bilateral upper extremity supported;During functional activity Standing balance-Leahy Scale: Poor Standing balance comment: reliant on external and UE support.                           ADL either performed or assessed with clinical judgement   ADL Overall ADL's : Needs assistance/impaired     Grooming: Wash/dry hands;Minimal assistance;Standing Grooming Details (indicate cue type and reason): neglects  to use soap             Lower Body Dressing: Total assistance;Sit to/from stand Lower Body Dressing Details (indicate cue type and reason): don socks Toilet Transfer: Minimal assistance;Ambulation;Regular Toilet;Grab bars;RW           Functional mobility during ADLs: Minimal assistance;Rolling walker General ADL Comments: pt unaware that he is in a bed that is wet due to incontinence pt states there is a hole in it     Vision       Perception     Praxis      Cognition Arousal/Alertness: Awake/alert Behavior During Therapy: Impulsive Overall Cognitive Status: Impaired/Different from baseline Area of Impairment: Orientation;Attention;Memory;Following commands;Safety/judgement;Awareness;Problem solving                 Orientation Level: Disoriented to;Time;Situation Current Attention Level: Sustained Memory: Decreased recall of precautions;Decreased short-term memory Following Commands: Follows one step commands inconsistently;Follows one step commands with increased time Safety/Judgement: Decreased awareness of safety;Decreased awareness of deficits Awareness: Intellectual Problem Solving: Slow processing;Difficulty sequencing General Comments: Pt oriented to self and place; able to recall he had a stroke at end of session. decreased recall of staff members and their roles. pt states he does not have to pee but pt had voided bladder already without awareness        Exercises     Shoulder Instructions       General Comments max HR 155    Pertinent Vitals/ Pain       Pain Assessment: No/denies pain  Home Living                                          Prior Functioning/Environment              Frequency  Min 2X/week        Progress Toward Goals  OT Goals(current goals can now be found in the care plan section)  Progress towards OT goals: Progressing toward goals  Acute Rehab OT Goals Patient Stated Goal: none stated by  patient OT Goal Formulation: Patient unable to participate in goal setting Time For Goal Achievement: 04/23/21 Potential to Achieve Goals: Good ADL Goals Pt Will Perform Grooming: with supervision;standing Pt Will Perform Lower Body Bathing: with supervision;sit to/from stand Pt Will Perform Upper Body Dressing: with supervision;sitting Pt Will Perform Lower Body Dressing: with min assist;sit to/from stand Pt Will Transfer to Toilet: with mod assist;stand pivot transfer;bedside commode Pt Will Perform Toileting - Clothing Manipulation and hygiene: with supervision;sit to/from stand Additional ADL Goal #1: pt will follow 2 step commands 50% of session Additional ADL Goal #2: pt will demonstrate orientation x3  Plan Discharge plan remains appropriate    Co-evaluation    PT/OT/SLP Co-Evaluation/Treatment: Yes Reason for Co-Treatment: For patient/therapist safety;To address functional/ADL transfers PT goals addressed during session: Mobility/safety with mobility OT goals addressed during session: ADL's and self-care;Strengthening/ROM;Proper use of Adaptive equipment and DME      AM-PAC OT "6 Clicks" Daily Activity     Outcome Measure   Help from another person eating meals?: A Little Help from another person taking care of personal grooming?: A Little Help from another person toileting, which includes using toliet, bedpan, or urinal?: A Little Help from another person bathing (including washing, rinsing, drying)?: A Little Help from another person to put on and taking off regular upper body clothing?: A Little Help from another person to put on and taking off regular lower body clothing?: A Lot 6 Click Score: 17    End of Session Equipment Utilized During Treatment: Gait belt;Rolling walker  OT Visit Diagnosis: Unsteadiness on feet (R26.81);Other abnormalities of gait and mobility (R26.89);Muscle weakness (generalized) (M62.81)   Activity Tolerance Patient tolerated treatment  well   Patient Left in chair;with call bell/phone within reach;with chair alarm set   Nurse Communication Mobility status;Precautions        Time: 1000-1023 OT Time Calculation (min): 23 min  Charges: OT General Charges $OT Visit: 1 Visit OT Treatments $Self Care/Home Management : 8-22 mins   Brynn, OTR/L  Acute Rehabilitation Services Pager: [phone] Office: [phone] .    Jeri Modena 04/11/2021, 11:25 AM

## 2021-04-11 NOTE — Progress Notes (Signed)
Report called to 3W RM4. Patients daughter has been notified.

## 2021-04-11 NOTE — Progress Notes (Signed)
Pt arrived on the unit with his daughter. Pt is oriented to the room sitting in the chair with the call bell within reach and the chair alarm on.

## 2021-04-11 NOTE — NC FL2 (Signed)
[place] LEVEL OF CARE SCREENING TOOL     IDENTIFICATION  Patient Name: Brad Harrell Birthdate: [birth year] Sex: male Admission Date (Current Location): 04/08/2021  [facility] and [place] Number:  Herbalist and Address:  [facility] [place], [place] [address] [place], [place] [address]      Provider Number: [id number]  Attending Physician Name and Address:  Stroke, Md, MD  Relative Name and Phone Number:  Jontay Maston [phone]    Current Level of Care: Hospital Recommended Level of Care: [place] Prior Approval Number:    Date Approved/Denied:   PASRR Number: [id number]  Discharge Plan: SNF    Current Diagnoses: Patient Active Problem List   Diagnosis Date Noted   Acute ischemic left MCA stroke ([place]) 04/08/2021   Stroke ([place]) 04/08/2021   Middle cerebral artery embolism, 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   Malnutrition of moderate degree 03/24/2021   Scrotal abscess 03/22/2021   Renal insufficiency 03/22/2021   Hyperkalemia 03/22/2021   Chronic combined systolic and diastolic CHF (congestive heart failure) ([place]) 03/22/2021   Hypothyroidism 03/22/2021   Hypothermia 76/73/4193   Metabolic acidosis 79/12/4095   Elevated lactic acid level 08/21/2016   Combined systolic and diastolic congestive heart failure (HCC)    Diabetes mellitus, new onset ([place]) 07/01/2014   Left arm weakness 07/01/2014   History of CVA  07/01/2014   Urinary tract infection 07/01/2014   Thrombocytopenia ([place]) 07/01/2014   DKA (diabetic ketoacidoses) 06/30/2014   Physical deconditioning 03/13/2013   Anemia, iron deficiency 03/08/2013   Bacteremia 03/06/2013   History of Liver abscess 03/05/2013   Encephalopathy, metabolic 35/32/9924   Anemia 26/83/4196   Complicated UTI (urinary tract infection) 08/15/2012   SIRS (systemic inflammatory response syndrome) ([place]) 08/15/2012   ARF (acute renal failure) ([place]) 08/15/2012   Hypertension    Urinary  retention    History of ischemic stroke    Hyperlipidemia    Cardiomyopathy ([place])    H/O cocaine abuse ([place])     Orientation RESPIRATION BLADDER Height & Weight     Self, Place  Normal Incontinent, External catheter Weight: 165 lb 5.5 oz (75 kg) Height:     BEHAVIORAL SYMPTOMS/MOOD NEUROLOGICAL BOWEL NUTRITION STATUS      Continent Diet (Please see DC Summary)  AMBULATORY STATUS COMMUNICATION OF NEEDS Skin   Extensive Assist Verbally Surgical wounds (closed incision on groin)                       Personal Care Assistance Level of Assistance  Bathing, Feeding, Dressing Bathing Assistance: Maximum assistance Feeding assistance: Independent Dressing Assistance: Limited assistance     Functional Limitations Info  Sight, Hearing, Speech Sight Info: 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) Hearing Info: Adequate Speech Info: Adequate    SPECIAL CARE FACTORS FREQUENCY  PT (By licensed PT), OT (By licensed OT)     PT Frequency: 5x/week OT Frequency: 5x/week            Contractures Contractures Info: Not present    Additional Factors Info  Code Status, Allergies, Insulin Sliding Scale Code Status Info: Full Allergies Info: Rocephin   Insulin Sliding Scale Info: See dc summary       Current Medications (04/11/2021):  This is the current hospital active medication list Current Facility-Administered Medications  Medication Dose Route Frequency Provider Last Rate Last Admin   acetaminophen (TYLENOL) tablet 650 mg  650 mg Oral Q4H PRN Luanne Bras, MD  Or   acetaminophen (TYLENOL) 160 MG/5ML solution 650 mg  650 mg Per Tube Q4H PRN Deveshwar, Sanjeev, MD       Or   acetaminophen (TYLENOL) suppository 650 mg  650 mg Rectal Q4H PRN Deveshwar, Willaim Rayas, MD       aspirin EC tablet 325 mg  325 mg Oral Daily Gwinda Maine, MD   325 mg at 04/11/21 1008   atorvastatin (LIPITOR) tablet 20 mg  20 mg Oral Daily Derek Jack, MD   20 mg at 04/11/21 1007   Chlorhexidine  Gluconate Cloth 2 % PADS 6 each  6 each Topical Daily Garvin Fila, MD   6 each at 04/11/21 1008   clopidogrel (PLAVIX) tablet 75 mg  75 mg Oral Daily Derek Jack, MD   75 mg at 04/11/21 1008   enoxaparin (LOVENOX) injection 40 mg  40 mg Subcutaneous Q24H Derek Jack, MD   40 mg at 04/11/21 1008   finasteride (PROSCAR) tablet 5 mg  5 mg Oral Daily Derek Jack, MD   5 mg at 04/11/21 1008   HYDROcodone-acetaminophen (NORCO/VICODIN) 5-325 MG per tablet 1-2 tablet  1-2 tablet Oral Q4H PRN Gardiner Barefoot, NP       insulin aspart (novoLOG) injection 0-9 Units  0-9 Units Subcutaneous Q4H Derek Jack, MD   2 Units at 04/10/21 2139   levothyroxine (SYNTHROID) tablet 100 mcg  100 mcg Oral QAC breakfast Derek Jack, MD   100 mcg at 04/11/21 0546   MEDLINE mouth rinse  15 mL Mouth Rinse BID Derek Jack, MD   15 mL at 04/11/21 1009   pantoprazole (PROTONIX) EC tablet 40 mg  40 mg Oral QHS Pham, Minh Q, RPH-CPP   40 mg at 04/10/21 2139   senna-docusate (Senokot-S) tablet 1 tablet  1 tablet Oral QHS PRN Derek Jack, MD         Discharge Medications: Please see discharge summary for a list of discharge medications.  Relevant Imaging Results:  Relevant Lab Results:   Additional Information SS#[id number]. [place] COVID-19 Vaccine 01/13/2020 , 12/20/2019  Benard Halsted, LCSW

## 2021-04-11 NOTE — Progress Notes (Addendum)
STROKE TEAM PROGRESS NOTE   INTERVAL HISTORY No acute events overnight, patient resting in bed. Just finished breakfast-ate 100%. Neurological exam is stable.   Vitals:   04/11/21 0900 04/11/21 1000 04/11/21 1100 04/11/21 1200  BP: 129/62 126/60 (!) 116/58 114/70  Pulse: 71 67 76 78  Resp: 20 18 15 17   Temp:    98.5 F (36.9 C)  TempSrc:    Axillary  SpO2: 95% 99% 98% 99%  Weight:       CBC:  Recent Labs  Lab 04/08/21 1431 04/09/21 0500 04/11/21 0039  WBC 12.2* 7.3 3.7*  NEUTROABS 11.3* 4.5  --   HGB 10.3* 8.6* 8.1*  HCT 32.4* 27.3* 26.3*  MCV 82.4 83.7 82.7  PLT 360 266 062   Basic Metabolic Panel:  Recent Labs  Lab 04/09/21 0500 04/11/21 0039  NA 135 138  K 4.4 4.2  CL 105 106  CO2 22 25  GLUCOSE 130* 102*  BUN 12 12  CREATININE 0.88 0.98  CALCIUM 8.6* 8.7*   Lipid Panel:  Recent Labs  Lab 04/09/21 0500  CHOL 108  TRIG 183*  HDL 18*  CHOLHDL 6.0  VLDL 37  LDLCALC 53   HgbA1c: No results for input(s): HGBA1C in the last 168 hours. Urine Drug Screen:  Recent Labs  Lab 04/08/21 1431  LABOPIA NONE DETECTED  COCAINSCRNUR NONE DETECTED  LABBENZ NONE DETECTED  AMPHETMU NONE DETECTED  THCU NONE DETECTED  LABBARB NONE DETECTED    Alcohol Level  Recent Labs  Lab 04/08/21 1431  ETH <10    Pertinent Imaging   Echocardiogram 03/24/2021:  Left Ventricle: ejection fraction, by estimation, is 25 to 30%. Left ventricular ejection fraction by 2D MOD biplane is 26.6 %.  The left ventricle has severely decreased function. The left ventricle  demonstrates global hypokinesis. The  left ventricular internal cavity size was normal in size. There is no left  ventricular hypertrophy. Indeterminate diastolic filling due to E-A  fusion.  Right Ventricle: The right ventricular size is normal. No increase in  right ventricular wall thickness. Right ventricular systolic function is  mildly reduced. Tricuspid regurgitation signal is inadequate for assessing  PA  pressure.  IAS/Shunts: The atrial septum is grossly normal.   04/08/21 CTA Head and Neck  CTA head:  1. Focal short-segment opacification of a left distal M2 MCA branch with occlusion. This vessel is non-opacified more proximally in the sylvian fissure, compatible with a more proximal M2 occlusion or high-grade stenosis. The exact location of M2 occlusion/stenosis is not confidently identified. Catheter arteriogram could further characterize if clinically indicated. 2. Severe right and moderate left paraclinoid ICA narrowing due to calcific atherosclerosis. 3. 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 4. Superimposed moderate right intradural and right P2 PCA stenosis.   CTA neck:  1. Streak artifact limits evaluation proximally with suspected age indeterminate occlusion of bilateral vertebral artery origins with reconstitution in the mid neck. Multifocal narrowing of the remainder of the right vertebral artery  04/10/21 MRI Brain  Patchy acute cortical infarction in the left MCA distribution. Punctate right frontal parietal cortex infarct. Advanced chronic small vessel disease.  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 is a 75 y.o. male residing in nursing home Hx HLD, HTN, tobacco abuse, and DM II, prior strokes found to be globally aphasic NIHSS 17 and LKW clear and therefore not a candidate for tPA found to have left distal M2 MCA branch occlusion noted on CTA and patient was taken emergently to IR after consent by daughter. S/P thrombectomy with TICI 3 revascularization now markedly  improved.  MRI only shows small patchy areas of stroke in the left MCA distribution without petechial hemorrhage. He has significant intracranial stenotic disease and he would benefit from aspirin 325mg  and clopidogrel 75mg  daily for 90 days.  Stroke:  Left MCA Stroke. Etiology cryptogenic; atheroembolism versus cardioembolic cause  Code Stroke CT head No acute abnormality.  Atrophy. ASPECTS 10. Remote inferior left cerebellar infarct.    CTA head Focal short-segment opacification of a left distal M2 MCA branch with occlusion, moderate right intradural and right P2 PCA stenosis. CTA neck Streak artifact limits evaluation proximally with suspected age indeterminate occlusion of bilateral vertebral artery origins with reconstitution in the mid neck. Multifocal narrowing of the remainder of the right vertebral artery and focal narrowing of the left vertebral artery at C4-C5.  CTP core of 35ml  v mismatch of 48ml . MRI brain - Patchy acute cortical infarction in the left MCA distribution. Punctate right frontal parietal cortex infarct.  Echocardiogram showed EF 25-30% Global hypokinesis, no LVT or IAS. New echo pending.  LDL 53 HgbA1c 5.3 VTE prophylaxis - Enoxaparin    Diet   Diet regular Room service appropriate? Yes; Fluid consistency: Thin   Increased dose of aspirin to 325mg  daily and continued clopidogrel 75mg  daily. Continue for for 90 day and then discontinue clopidogrel and continue aspirin 325mg . Disposition: to home.  Hypertension Continue PO medications with blood pressure goal <140/90. Long-term BP goal normotensive  Hyperlipidemia LDL 53, goal < 70 High intensity statin atorvastatin 20mg  Continue statin at discharge  Diabetes type II Controlled HgbA1c 5.3, goal < 7.0 CBGs Recent Labs    04/11/21 0350 04/11/21 0820 04/11/21 1209  GLUCAP 105* 81 135*   SSI  Chronic systolic HF - TTE - Pending   Hypothyroidism - Continue home synthroid 1050mcg daily   Urinary  retention 2/2 BPH - Continue home finasteride  Hospital day # [place], AGPCNP-BC  Stroke Nurse Practitioner   ATTENDING NOTE: I reviewed above note and agree with the assessment and plan. Pt was seen and examined.   75 year old male with history of hypertension, hyperlipidemia, smoker, diabetes, admitted for global aphasia.  Patient had a stroke in 04/2011 for left leg weakness.  MRI showed right thalamic infarct, old left cerebellum and right frontal infarct.  MRI diffuse atherosclerosis and left VBJ stenosis.  Admitted on 03/21/2021 for severe sepsis with left scrotal abscess status post left orchiectomy and prostate resection.  At that time, 2D echo showed EF 25 to 30%, treated for CHF and recommend outpatient follow-up with cardiology.  On this admission, CT no acute abnormality.  CTA head and neck showed left distal M2 occlusion as well as bilateral siphon atherosclerosis.  CTP showed ischemic core 7 cc versus penumbra 31 cc.  Status post IR with TICI3.  MRI showed left MCA infarct as well as right ACA punctate infarct.  EF again 25 to 30%.  LDL 53, A1c 5.3.  Creatinine 0.88, WBC 7.3, hemoglobin 10.3-> 8.6.  Temp:  [98.1 F (36.7 C)-98.6 F (37 C)]  98.4 F (36.9 C) (06/13 0800) Pulse Rate:  [55-79] 61 (06/13 0800) Resp:  [10-19] 13 (06/13 0800) BP: (99-134)/(43-84) 130/67 (06/13 0800) SpO2:  [97 %-100 %] 99 % (06/13 0800)  General - Well nourished, well developed, in no apparent distress.  Ophthalmologic - fundi not visualized due to noncooperation.  Cardiovascular - Regular rhythm and rate.  Mental Status -  Level of arousal and orientation to place were intact. However, not orientated to time and age. Language including expression, naming, repetition, comprehension was assessed and found intact. Mild to moderate dysarthria  Cranial Nerves II - XII - II - Visual field intact OS. right eye blind with corneal clouding III, IV, VI - Extraocular movements intact. V  - Facial sensation intact bilaterally. VII - right mild facial droop. VIII - Hearing & vestibular intact bilaterally. X - Palate elevates symmetrically. XI - Chin turning & shoulder shrug intact bilaterally. XII - Tongue protrusion to the right.  Motor Strength - The patient's strength was normal in all extremities and pronator drift was absent except right hand grip and right foot DF 4/5.  Bulk was normal and fasciculations were absent.   Motor Tone - Muscle tone was assessed at the neck and appendages and was normal.  Reflexes - The patient's reflexes were symmetrical in all extremities and he had no pathological reflexes.  Sensory - Light touch, temperature/pinprick were assessed and were symmetrical.    Coordination - The patient had normal movements in the right hand, however, left FTN dysmetria.  Tremor was absent.  Gait and Station - deferred.  Etiology for patient stroke likely cardioembolic given low EF, although large vessel source also in the differential given bilateral siphon atherosclerosis.  Currently on aspirin 325 Plavix 75 and Lipitor 20.  Given low EF, will have cardiology consultation for CHF.  Will discuss with cardiology to consider anticoagulation given low EF and current stroke.  PT/OT recommend SNF.  For detailed assessment and plan, please refer to above as I have made changes wherever appropriate.   Rosalin Hawking, MD PhD Stroke Neurology 04/11/2021 8:10 PM  This patient is critically ill due to left MCA and ACA infarct status post thrombectomy, CHF with low EF, anemia and at significant risk of neurological worsening, death form recurrent stroke, hemorrhagic conversion, seizure, heart failure. This patient's care requires constant monitoring of vital signs, hemodynamics, respiratory and cardiac monitoring, review of multiple databases, neurological assessment, discussion with family, other specialists and medical decision making of high complexity. I spent 40 minutes  of neurocritical care time in the care of this patient.    To contact Stroke Continuity provider, please refer to [email or web address] After hours, contact General Neurology

## 2021-04-11 NOTE — TOC Initial Note (Signed)
Transition of Care [facility]) - Initial/Assessment Note    Patient Details  Name: Brad Harrell MRN: [id number] Date of Birth: 14-Jun-1946  [facility] CM/SW Contact:    Benard Halsted, LCSW Phone Number: 04/11/2021, 12:20 PM  Clinical Narrative:                 CSW spoke with patient's daughter, Jeannene Patella. She confirmed that patient was at [facility] for rehab and she would like him to return there at discharge by [place]. [facility] able to accept patient back; would need updated COVID test. Will continue to follow for medical readiness.   Expected Discharge Plan: Skilled Nursing Facility Barriers to Discharge: Continued Medical Work up   Patient Goals and CMS Choice Patient states their goals for this hospitalization and ongoing recovery are:: Rehab CMS Medicare.gov Compare Post Acute Care list provided to:: Patient Represenative (must comment) Choice offered to / list presented to : Adult Children  Expected Discharge Plan and Services Expected Discharge Plan: [place] In-house Referral: Clinical Social Work   Post Acute Care Choice: [place] Living arrangements for the past 2 months: [place], [place]                                      Prior Living Arrangements/Services Living arrangements for the past 2 months: [place], [place] Lives with:: Adult Children Patient language and need for interpreter reviewed:: Yes Do you feel safe going back to the place where you live?: Yes      Need for Family Participation in Patient Care: Yes (Comment) Care giver support system in place?: Yes (comment)   Criminal Activity/Legal Involvement Pertinent to Current Situation/Hospitalization: No - Comment as needed  Activities of Daily Living Home Assistive Devices/Equipment: Gilford Rile (specify type) ADL Screening (condition at time of admission) Patient's cognitive ability adequate to safely  complete daily activities?: No Is the patient deaf or have difficulty hearing?: No Does the patient have difficulty seeing, even when wearing glasses/contacts?: Yes Does the patient have difficulty concentrating, remembering, or making decisions?: Yes Patient able to express need for assistance with ADLs?: Yes Does the patient have difficulty dressing or bathing?: Yes Independently performs ADLs?: No Communication: Appropriate for developmental age Dressing (OT): Needs assistance Is this a change from baseline?: Pre-admission baseline Does the patient have difficulty walking or climbing stairs?: Yes Weakness of Legs: Both Weakness of Arms/Hands: Both  Permission Sought/Granted Permission sought to share information with : Facility Sport and exercise psychologist, Family Supports Permission granted to share information with : No  Share Information with NAME: [name]  Permission granted to share info w AGENCY: [facility] granted to share info w Relationship: Daughter  Permission granted to share info w Contact Information: [phone]  Emotional Assessment Appearance:: Appears stated age Attitude/Demeanor/Rapport: Unable to Assess Affect (typically observed): Unable to Assess Orientation: : Oriented to Self, Oriented to Place Alcohol / Substance Use: Not Applicable Psych Involvement: No (comment)  Admission diagnosis:  Acute ischemic left MCA stroke (HCC) [I63.512] Cerebrovascular accident (CVA), unspecified mechanism ([place]) [I63.9] Stroke [facility]) [I63.9] Middle cerebral artery embolism, 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] Patient Active Problem List   Diagnosis Date Noted   Acute ischemic left MCA stroke ([place]) 04/08/2021   Stroke ([place]) 04/08/2021   Middle cerebral artery embolism, 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   Malnutrition of moderate degree 03/24/2021   Scrotal abscess 03/22/2021   Renal insufficiency  03/22/2021   Hyperkalemia 03/22/2021   Chronic combined systolic and diastolic CHF (congestive heart  failure) ([place]) 03/22/2021   Hypothyroidism 03/22/2021   Hypothermia 17/49/4496   Metabolic acidosis 75/91/6384   Elevated lactic acid level 08/21/2016   Combined systolic and diastolic congestive heart failure (HCC)    Diabetes mellitus, new onset ([place]) 07/01/2014   Left arm weakness 07/01/2014   History of CVA  07/01/2014   Urinary tract infection 07/01/2014   Thrombocytopenia ([place]) 07/01/2014   DKA (diabetic ketoacidoses) 06/30/2014   Physical deconditioning 03/13/2013   Anemia, iron deficiency 03/08/2013   Bacteremia 03/06/2013   History of Liver abscess 03/05/2013   Encephalopathy, metabolic 66/59/9357   Anemia 01/77/9390   Complicated UTI (urinary tract infection) 08/15/2012   SIRS (systemic inflammatory response syndrome) ([place]) 08/15/2012   ARF (acute renal failure) ([place]) 08/15/2012   Hypertension    Urinary retention    History of ischemic stroke    Hyperlipidemia    Cardiomyopathy ([place])    H/O cocaine abuse ([place])    PCP:  Nolene Ebbs, MD Pharmacy:  No Pharmacies Listed    Social Determinants of Health (SDOH) Interventions    Readmission Risk Interventions No flowsheet data found.

## 2021-04-12 DIAGNOSIS — I63512 Cerebral infarction due to unspecified occlusion or stenosis of left middle cerebral artery: Principal | ICD-10-CM

## 2021-04-12 DIAGNOSIS — I5022 Chronic systolic (congestive) heart failure: Secondary | ICD-10-CM

## 2021-04-12 LAB — GLUCOSE, CAPILLARY
Glucose-Capillary: 111 mg/dL — ABNORMAL HIGH (ref 70–99)
Glucose-Capillary: 144 mg/dL — ABNORMAL HIGH (ref 70–99)
Glucose-Capillary: 106 mg/dL — ABNORMAL HIGH (ref 70–99)
Glucose-Capillary: 119 mg/dL — ABNORMAL HIGH (ref 70–99)
Glucose-Capillary: 143 mg/dL — ABNORMAL HIGH (ref 70–99)
Glucose-Capillary: 121 mg/dL — ABNORMAL HIGH (ref 70–99)
Glucose-Capillary: 113 mg/dL — ABNORMAL HIGH (ref 70–99)

## 2021-04-12 LAB — CBC
HCT: 27.7 % — ABNORMAL LOW (ref 39.0–52.0)
Hemoglobin: 8.7 g/dL — ABNORMAL LOW (ref 13.0–17.0)
MCH: 26 pg (ref 26.0–34.0)
MCHC: 31.4 g/dL (ref 30.0–36.0)
MCV: 82.7 fL (ref 80.0–100.0)
Platelets: 177 10*3/uL (ref 150–400)
RBC: 3.35 MIL/uL — ABNORMAL LOW (ref 4.22–5.81)
RDW: 18.7 % — ABNORMAL HIGH (ref 11.5–15.5)
WBC: 4.7 10*3/uL (ref 4.0–10.5)
nRBC: 0 % (ref 0.0–0.2)

## 2021-04-12 LAB — BASIC METABOLIC PANEL
Anion gap: 8 (ref 5–15)
BUN: 13 mg/dL (ref 8–23)
CO2: 21 mmol/L — ABNORMAL LOW (ref 22–32)
Calcium: 9.7 mg/dL (ref 8.9–10.3)
Chloride: 108 mmol/L (ref 98–111)
Creatinine, Ser: 1.04 mg/dL (ref 0.61–1.24)
GFR, Estimated: >60 mL/min (ref >60)
Glucose, Bld: 98 mg/dL (ref 70–99)
Potassium: 4.8 mmol/L (ref 3.5–5.1)
Sodium: 137 mmol/L (ref 135–145)

## 2021-04-12 MED ORDER — SODIUM CHLORIDE 0.9% FLUSH
3.0000 mL | Freq: Two times a day (BID) | INTRAVENOUS | Status: DC
  Administered 2021-04-12 – 2021-04-13 (×2): 3 mL via INTRAVENOUS

## 2021-04-12 MED ORDER — SODIUM CHLORIDE 0.9 % IV SOLN: 250.0000 mL | INTRAVENOUS | Status: DC | PRN

## 2021-04-12 MED ORDER — SODIUM CHLORIDE 0.9% FLUSH: 3.0000 mL | INTRAVENOUS | Status: DC | PRN

## 2021-04-12 MED ORDER — CARVEDILOL 3.125 MG PO TABS
3.1250 mg | ORAL_TABLET | Freq: Two times a day (BID) | ORAL | Status: DC
  Administered 2021-04-12 – 2021-04-15 (×6): 3.125 mg via ORAL
  Filled 2021-04-12 (×6): qty 1

## 2021-04-12 MED ORDER — LOSARTAN POTASSIUM 25 MG PO TABS
25.0000 mg | ORAL_TABLET | Freq: Every day | ORAL | Status: DC
  Administered 2021-04-12 – 2021-04-15 (×4): 25 mg via ORAL
  Filled 2021-04-12 (×4): qty 1

## 2021-04-12 MED ORDER — FUROSEMIDE 20 MG PO TABS
20.0000 mg | ORAL_TABLET | ORAL | Status: DC
  Administered 2021-04-13: 20 mg via ORAL
  Filled 2021-04-12 (×2): qty 1

## 2021-04-12 NOTE — Care Management Important Message (Signed)
Important Message  Patient Details  Name: Brad Harrell MRN: [id number] Date of Birth: [birth year]   Medicare Important Message Given:  Yes     Orbie Pyo 04/12/2021, 2:43 PM

## 2021-04-12 NOTE — Progress Notes (Signed)
  Shared Decision Making/Informed Consent   The risks [esophageal damage, perforation (1:10,000 risk), bleeding, pharyngeal hematoma as well as other potential complications associated with conscious sedation including aspiration, arrhythmia, respiratory failure and death], benefits (treatment guidance and diagnostic support) and alternatives of a transesophageal echocardiogram were discussed in detail with patient's daughter and heath care proxy Ms. Ewell Poe and she is consenting for the patient to proceed.   TEE scheduled on Thursday 04/14/21, please ensure NPO at midnight prior to TEE.

## 2021-04-12 NOTE — Progress Notes (Addendum)
STROKE TEAM PROGRESS NOTE   INTERVAL HISTORY  No acute events overnight, patient is resting in bed.   Hemoglobin appears to be down trending, are waiting on CBC to result this AM. Per nursing no bloody urine, bloody stool.   Cardiology consulted for heart failure regimen optimization and regarding their opinion on anticoagulation for an EF of 25 to 30 %.   Patients physical examination is stable.  Vitals:   04/11/21 2009 04/11/21 2345 04/12/21 0325 04/12/21 0724  BP: (!) 116/51 123/64 (!) 125/59 (!) 141/67  Pulse: 66 82 68 66  Resp: 18 18 18 17   Temp: 98.2 F (36.8 C) 98.2 F (36.8 C) 98.5 F (36.9 C) 98.4 F (36.9 C)  TempSrc: Oral Oral Oral Oral  SpO2: 100% 100% 100% 100%  Weight:       CBC:  Recent Labs  Lab 04/08/21 1431 04/09/21 0500 04/11/21 0039  WBC 12.2* 7.3 3.7*  NEUTROABS 11.3* 4.5  --   HGB 10.3* 8.6* 8.1*  HCT 32.4* 27.3* 26.3*  MCV 82.4 83.7 82.7  PLT 360 266 629   Basic Metabolic Panel:  Recent Labs  Lab 04/11/21 0039 04/12/21 0626  NA 138 137  K 4.2 4.8  CL 106 108  CO2 25 21*  GLUCOSE 102* 98  BUN 12 13  CREATININE 0.98 1.04  CALCIUM 8.7* 9.7   Lipid Panel:  Recent Labs  Lab 04/09/21 0500  CHOL 108  TRIG 183*  HDL 18*  CHOLHDL 6.0  VLDL 37  LDLCALC 53   HgbA1c: No results for input(s): HGBA1C in the last 168 hours. Urine Drug Screen:  Recent Labs  Lab 04/08/21 1431  LABOPIA NONE DETECTED  COCAINSCRNUR NONE DETECTED  LABBENZ NONE DETECTED  AMPHETMU NONE DETECTED  THCU NONE DETECTED  LABBARB NONE DETECTED    Alcohol Level  Recent Labs  Lab 04/08/21 1431  ETH <10    Pertinent Imaging   Echocardiogram 03/24/2021:  Left Ventricle: ejection fraction, by estimation, is 25 to 30%. Left ventricular ejection fraction by 2D MOD biplane is 26.6 %.The left ventricle has severely decreased function. The left ventricle demonstrates global hypokinesis. The left ventricular internal cavity size was normal in size. There is no left  ventricular hypertrophy. Indeterminate diastolic filling due to E-A  fusion.  Right Ventricle: The right ventricular size is normal. No increase in  right ventricular wall thickness. Right ventricular systolic function is  mildly reduced. Tricuspid regurgitation signal is inadequate for assessing  PA pressure.  IAS/Shunts: The atrial septum is grossly normal.   04/08/21 CTA Head and Neck  CTA head:  1. Focal short-segment opacification of a left distal M2 MCA branch with occlusion. This vessel is non-opacified more proximally in the sylvian fissure, compatible with a more proximal M2 occlusion or high-grade stenosis. The exact location of M2 occlusion/stenosis is not confidently identified. Catheter arteriogram could further characterize if clinically indicated. 2. Severe right and moderate left paraclinoid ICA narrowing due to calcific atherosclerosis. 3. 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 4. Superimposed moderate right intradural and right P2 PCA stenosis.   CTA neck:  1. Streak artifact limits evaluation proximally with suspected age indeterminate occlusion of bilateral vertebral artery origins with reconstitution in the mid neck. Multifocal narrowing of the remainder of the right vertebral artery  04/10/21 MRI Brain  Patchy acute cortical infarction in the left MCA distribution. Punctate right frontal parietal cortex infarct. Advanced chronic small vessel disease.  04/11/21  Echo Complete Bubble Study WO Image Enhancing Agent   1. Left ventricular ejection fraction, by estimation, is 25 to 30%. The left ventricle has severely decreased function. The left ventricle demonstrates global hypokinesis. There is mild left ventricular hypertrophy. Left ventricular diastolic parameters are consistent with Grade I diastolic dysfunction (impaired relaxation).   2. No apical thrombus seen, though in setting of  severe LV dysfunction and presentation with CVA, would consider limited echo with contrast to rule out LV thrombus   3. Right ventricular systolic function is normal. The right ventricular size is normal. Tricuspid regurgitation signal is inadequate for assessing PA pressure.   4. The mitral valve is normal in structure. Trivial mitral valve regurgitation.   5. The aortic valve is tricuspid. Aortic valve regurgitation is not visualized. No aortic stenosis is present.   6. The inferior vena cava is normal in size with greater than 50% respiratory variability, suggesting right atrial pressure of 3 mmHg.   7. Agitated saline contrast bubble study was negative, with no evidence of any interatrial shunt.   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 is a 75 y.o. male residing in nursing home Hx HLD, HTN, tobacco abuse, and DM II, prior strokes found to be globally aphasic NIHSS 17 and LKW clear and therefore not a candidate for tPA found to have left distal M2 MCA branch occlusion noted on CTA and patient was taken emergently to IR after consent by daughter. S/P thrombectomy with TICI 3 revascularization now markedly improved.  MRI only shows small patchy areas of stroke in the left MCA distribution without petechial hemorrhage. He has significant intracranial stenotic disease and he would benefit from aspirin 325mg  and clopidogrel 75mg  daily for 90 days.  Stroke:  Left MCA Stroke. Etiology cryptogenic; atheroembolism versus cardioembolic cause  Code Stroke CT head No acute abnormality.  Atrophy. ASPECTS 10. Remote inferior left cerebellar infarct.    CTA head Focal short-segment opacification of a left  distal M2 MCA branch with occlusion, moderate right intradural and right P2 PCA stenosis. CTA neck Streak artifact limits evaluation proximally with suspected age indeterminate occlusion of bilateral vertebral artery origins with reconstitution in the mid neck. Multifocal narrowing of the remainder of the right vertebral artery and focal narrowing of the left vertebral artery at C4-C5.  CTP core of 60ml  v mismatch of 36ml . MRI brain - Patchy acute cortical infarction in the left MCA distribution. Punctate right frontal parietal cortex infarct.  Echocardiogram showed EF 25-30% Global hypokinesis, no LVT or IAS. Repeat echo showed stable EF at 25 to 30 %. LDL 53 HgbA1c 5.3 VTE prophylaxis - Enoxaparin    Diet   Diet regular Room service appropriate? Yes; Fluid consistency: Thin   Increased dose of aspirin to 325mg  daily and continued clopidogrel 75mg  daily. Continue for for 90 day and then discontinue clopidogrel and continue aspirin 325mg . Disposition: to home.  Hypertension Continue PO medications with blood pressure goal <140/90. Long-term BP goal normotensive  Hyperlipidemia LDL 53, goal < 70 High intensity statin atorvastatin 20mg  Continue statin at discharge  Diabetes type II Controlled HgbA1c 5.3, goal < 7.0 CBGs Recent Labs    04/11/21 2346 04/12/21 0323 04/12/21 8295  GLUCAP 101* 144* 106*   SSI  Chronic systolic HF - TTE this admission stable compared to TTE on 5/26. Cardiology consulted for HF regimen optimization + opinion on AC in the setting of reduced EF at 25-30 %. Thank you for consulting.    Hypothyroidism - Continue home synthroid 1072mcg daily   Urinary retention 2/2 BPH - Continue home finasteride  Heme/ID  No active infectious issues. Noted to be anemic with hemoglobin on admission 10.3, has downtrended to the 8 range possibly in the setting of recent thrombectomy procedure.  - Trend hemoglobin  - Transfuse for hemoglobin < 7  - FFWU if he fevers    Hospital day # [place], AGPCNP-BC  Stroke Nurse Practitioner   ATTENDING NOTE: I reviewed above note and agree with the assessment and plan. Pt was seen and examined.   No acute event overnight, neuro stable.  Repeat EF 25 to 30%.  Stroke etiology likely due to low EF.  Cardiology consulted, recommend TEE for further evaluation on 6/16.  Continue current treatment with DAPT.  Will discuss with cardiology to consider anticoagulation.  PT/OT recommend SNF.  For detailed assessment and plan, please refer to above as I have made changes wherever appropriate.   Rosalin Hawking, MD PhD Stroke Neurology 04/12/2021 8:12 AM    To contact Stroke Continuity provider, please refer to [email or web address] After hours, contact General Neurology

## 2021-04-12 NOTE — Consult Note (Addendum)
Cardiology Consultation:   Patient ID: Brad Harrell MRN: [id number]; DOB: 04/28/1946  Admit date: 04/08/2021 Date of Consult: 04/12/2021  PCP:  Nolene Ebbs, MD   [facility] Providers Cardiologist:  Dr Percival Spanish   Patient Profile:   Brad Harrell is a 75 y.o. male with a hx of chronic systolic and diastolic heart failure, cardiomyopathy of unclear etiology, HTN, HLD, type 2 DM, BPH, CVA x4, hypothyroidism, tobacco use, recent admission 03/21/21- 03/30/21 at [facility] for sepsis due to left epididymoorchitis with abscess s/p left scrotal exploration with left orchiectomy and drainage of pyocele and TURP, who is currently admitted for acute left MCA CVA s/p thrombectomy with TICI 3 revascularization. Cardiology is consulted on 04/12/21 due to chronic combined heart failure and anticoagulation in the setting of suspected cardioembolic CVA and low EF at the request of [place] NP.    History of Present Illness:   Brad Harrell does not follow cardiology outpatient previously.   He was recently hospitalized at [facility] 03/21/21- 03/30/21 for left epididymoorchitis with abscess, underwent left scrotal exploration with left orchiectomy and drainage of pyocele and TURP, and treated with sepsis protocol and antibiotic. He was discharged to SNF on 03/30/21.   Cardiology was consulted during above hospitalization on 03/27/21 for evaluation of CHF, as pre-op Echo on 03/24/21 showed EF  25-30%, LV severely decreased function, LV global hypokinesis, indeterminate diastolic filling, RV systolic function mildly reduced, trivial MR, mild dilatation of ascending aorta 63m. He was noted to have low EF since 2015 Echo , cardiomyopathy etiology was unclear, he does not see any cardiologist previously nor taking any GDMT. On exam he complained DOE, peripheral edema, intermittent palpitation. He was started on Lasix for volume overload, and discharged on coreg 3.1283mBID, losartan 2559maily, and lasix 42m32mD for  medical therapy, Entresto and spironolactone were not added due to low BP.  He was also recommended outpatient ischemic evaluation when acute illness stabilized to further evaluate underlying etiology of cardiomyopathy.  He was arranged an appointment with cardiology on 04/22/2021.   Unfortunately patient developed onset of global aphasia, right sided hemiparesis, and altered mental status on 04/08/21 and was sent back to ER for CVA evalaution. His NIHSS was 17. LKN was unknown. CTA H&N showed 31 mL area of mismatch/penumbra in the posterior left MCA territory with 7 mL core infarct. Left distal M2 MCA branch with occlusion, moderate right intradural and right P2 PCA stenosis, suspected age indeterminate occlusion of bilateral vertebral artery origins with reconstitution in the mid neck. Multifocal narrowing of the remainder of the right vertebral artery and focal narrowing of the left vertebral artery at C4-C5. He was not a candidate for TPA. He was taken urgently to IR and underwent thrombectomy with TICI 3 revascularization, with subsequent marked improvement of neuro deficit. MRI of brain confirmed patchy acute cortical infarction in the left MCA distribution. Punctate right frontal parietal cortex infarct. Advanced chronic small vessel disease. TTE with bubble study on 04/11/21 showed EF 25 to 30%, LV severely decreased function, LV global hypokinesis, mild LVH, grade 1 DD, no apical thrombus, RV normal, trivial MR, no evidence of intra atrial shunt.   He is currently admitted under neurology service.  Recommended aspirin 325 mg and Plavix 75 mg for 90 days and continue Lipitor 20 mg daily.  LDL 53.  Hemoglobin A1c 5.3.  Cardiology is consulted for acute systolic and diastolic heart failure, unchanged low EF 25 to 30%, and recommendations anticoagulation in the setting of low  EF.   During encounter, patient is alert and oriented to person/time, he is aware that he is in the hospital, had suffered a stroke.   He answers question with short phrases such as yes and no.  He states he is doing well, agreed that his stroke symptoms are improving.  He denied any chest pain, heart palpitation, heart flutter sensation, dizziness, shortness of breath.  He states he has been taking medication from the staff.  He states he was told he has irregular heartbeat at one point, but never was diagnosed with atrial fibrillation or atrial flutter or taking anticoagulant in the past. Called patient's daughter, who agreed that patient has never had diagnosis of atrial flutter or fibrillation.  Patient state he had used cocaine in the past, but no longer using cocaine nor smoke cigarettes at this time. He denied ever having cardiac cath evaluation, has not seen cardiologist.    Past Medical History:  Diagnosis Date   Anemia    Iron Defficiency   Cardiomyopathy ([place])    Combined systolic and diastolic congestive heart failure ([place])    Diabetes mellitus without complication ([place])    TYPE 2   Foley catheter in place    SINCE NOV 2017   H/O cocaine abuse ([place])    Hyperlipidemia    Hypertension    Hypothyroidism    Liver abscess    measuring 7 x 9 cm/notes 03/04/2013   Stroke ([place]) 2010   WEAK ON BOTH SIDES NOW TOTAL OF 3 STROKES    Urinary retention     Past Surgical History:  Procedure Laterality Date   APPENDECTOMY     COLONOSCOPY N/A 03/11/2013   Procedure: COLONOSCOPY;  Surgeon: Beryle Beams, MD;  Location: [facility];  Service: Endoscopy;  Laterality: N/A;   COLONOSCOPY WITH PROPOFOL N/A 03/15/2018   Procedure: COLONOSCOPY WITH PROPOFOL;  Surgeon: Carol Ada, MD;  Location: [facility] END[facility];  Service: Endoscopy;  Laterality: N/A;   CYSTOSCOPY WITH INSERTION OF UROLIFT N/A 01/04/2017   Procedure: CYSTOSCOPY WITH INSERTION OF UROLIFT x8;  Surgeon: Cleon Gustin, MD;  Location: [facility];  Service: Urology;  Laterality: N/A;   ESOPHAGOGASTRODUODENOSCOPY N/A 03/11/2013   Procedure: ESOPHAGOGASTRODUODENOSCOPY (EGD);   Surgeon: Beryle Beams, MD;  Location: [facility] ENDOSCOPY;  Service: Endoscopy;  Laterality: N/A;   GIVENS CAPSULE STUDY N/A 03/12/2013   Procedure: GIVENS CAPSULE STUDY;  Surgeon: Beryle Beams, MD;  Location: [facility];  Service: Endoscopy;  Laterality: N/A;   INGUINAL HERNIA REPAIR Right    IR CT HEAD LTD  04/08/2021   IR PERCUTANEOUS ART THROMBECTOMY/INFUSION INTRACRANIAL INC DIAG ANGIO  04/08/2021   POLYPECTOMY  03/15/2018   Procedure: POLYPECTOMY;  Surgeon: Carol Ada, MD;  Location: [facility];  Service: Endoscopy;;   RADIOLOGY WITH ANESTHESIA N/A 04/08/2021   Procedure: IR WITH ANESTHESIA;  Surgeon: Radiologist, Medication, MD;  Location: [facility];  Service: Radiology;  Laterality: N/A;   SCROTAL EXPLORATION Left 03/25/2021   Procedure: LEFT SCROTUM EXPLORATION WITH ORCHIECTOMY;  Surgeon: [name], MD;  Location: [facility];  Service: Urology;  Laterality: Left;   TIBIA FRACTURE SURGERY Left    TRANSURETHRAL RESECTION OF PROSTATE N/A 03/25/2021   Procedure: CYSTOSCOPY TRANSURETHRAL RESECTION OF THE PROSTATE (TURP);  Surgeon: [name], MD;  Location: [facility];  Service: Urology;  Laterality: N/A;     Home Medications:  Prior to Admission medications   Medication Sig Start Date End Date Taking? Authorizing Provider  acetaminophen (TYLENOL) 325 MG tablet Take 2 tablets (  650 mg total) by mouth every 6 (six) hours as needed for mild pain (or Fever >/= 101). Patient taking differently: Take 650 mg by mouth every 4 (four) hours as needed (elevated temp). 08/21/16  Yes Geradine Girt, DO  aspirin EC 81 MG tablet Take 81 mg by mouth daily at 12 noon.   Yes [provider]  atorvastatin (LIPITOR) 20 MG tablet TAKE ONE TABLET BY MOUTH DAILY Patient taking differently: Take 20 mg by mouth every evening. 09/07/14  Yes Reed, Tiffany L, DO  carvedilol (COREG) 3.125 MG tablet Take 3.125 mg by mouth 2 (two) times daily with a meal. 11/11/16  Yes [provider]  clopidogrel (PLAVIX) 75  MG tablet Take 75 mg by mouth daily at 12 noon.   Yes [provider]  finasteride (PROSCAR) 5 MG tablet Take 5 mg by mouth daily at 12 noon. 03/16/21  Yes [provider]  furosemide (LASIX) 20 MG tablet Take 1 tablet (20 mg total) by mouth every other day. 03/31/21 04/30/21 Yes Arrien, Jimmy Picket, MD  levothyroxine (SYNTHROID, LEVOTHROID) 100 MCG tablet Take 100 mcg by mouth daily before breakfast.    Yes [provider]  losartan (COZAAR) 25 MG tablet Take 1 tablet (25 mg total) by mouth daily. Patient taking differently: Take 25 mg by mouth daily at 12 noon. 03/30/21 04/29/21 Yes Arrien, Jimmy Picket, MD  metFORMIN (GLUCOPHAGE) 500 MG tablet Take 500 mg by mouth daily with breakfast.    Yes [provider]  Multiple Vitamin (MULTIVITAMIN WITH MINERALS) TABS tablet Take 1 tablet by mouth daily. 03/31/21 04/30/21 Yes Arrien, Jimmy Picket, MD  oxyCODONE (OXY IR/ROXICODONE) 5 MG immediate release tablet Take 1 tablet (5 mg total) by mouth every 6 (six) hours as needed for severe pain. 03/30/21  Yes Arrien, Jimmy Picket, MD  polyethylene glycol (MIRALAX / GLYCOLAX) 17 g packet Take 17 g by mouth daily as needed for moderate constipation. 03/30/21  Yes Arrien, Jimmy Picket, MD  feeding supplement (ENSURE ENLIVE / ENSURE PLUS) LIQD Take 237 mLs by mouth 2 (two) times daily between meals. Patient not taking: No sig reported 03/30/21 04/29/21  Arrien, Jimmy Picket, MD  glucose blood (FREESTYLE LITE) test strip Use as instructed Patient taking differently: 1 each by Other route daily. Use as instructed 07/31/14   Blanchie Serve, MD  glucose monitoring kit (FREESTYLE) monitoring kit 1 each by Does not apply route as needed for other. For blood sugar check Patient taking differently: 1 each by Does not apply route daily. For blood sugar check 07/31/14   Blanchie Serve, MD  Lancets 28G MISC To check blood sugar three times daily with meals and at bedtime Patient taking  differently: 1 strip by Other route daily. 07/31/14   Blanchie Serve, MD    Inpatient Medications: Scheduled Meds:  aspirin EC  325 mg Oral Daily   atorvastatin  20 mg Oral Daily   Chlorhexidine Gluconate Cloth  6 each Topical Daily   clopidogrel  75 mg Oral Daily   enoxaparin (LOVENOX) injection  40 mg Subcutaneous Q24H   finasteride  5 mg Oral Daily   insulin aspart  0-9 Units Subcutaneous Q4H   levothyroxine  100 mcg Oral QAC breakfast   mouth rinse  15 mL Mouth Rinse BID   pantoprazole  40 mg Oral QHS   Continuous Infusions:  PRN Meds: acetaminophen **OR** acetaminophen (TYLENOL) oral liquid 160 mg/5 mL **OR** acetaminophen, HYDROcodone-acetaminophen, senna-docusate  Allergies:    Allergies  Allergen Reactions  Rocephin [Ceftriaxone] Nausea And Vomiting    Social History:   Social History   Socioeconomic History   Marital status: Widowed    Spouse name: Not on file   Number of children: Not on file   Years of education: Not on file   Highest education level: Not on file  Occupational History   Not on file  Tobacco Use   Smoking status: Former    Packs/day: 1.00    Years: 51.00    Pack years: 51.00    Types: Cigarettes   Smokeless tobacco: Never   Tobacco comments:    QUIT NOV 2010  Vaping Use   Vaping Use: Never used  Substance and Sexual Activity   Alcohol use: No    Comment: NOV 2010   Drug use: Yes    Types: Cocaine    Comment: NOV 2010 LAST USED   Sexual activity: Not Currently  Other Topics Concern   Not on file  Social History Narrative   Not on file   Social Determinants of Health   Financial Resource Strain: Not on file  Food Insecurity: Not on file  Transportation Needs: Not on file  Physical Activity: Not on file  Stress: Not on file  Social Connections: Not on file  Intimate Partner Violence: Not on file    Family History:    Family History  Problem Relation Age of Onset   Hypertension Mother    Hypertension Father     Diabetes type II Father    Hypertension Brother      ROS:  Constitutional: Denied fever, chills Eyes: Denied vision change or loss Ears/Nose/Mouth/Throat: Denied ear ache, sore throat, coughing, sinus pain Cardiovascular: denied chest pain/pressure Respiratory: denied shortness of breath Gastrointestinal: Denied nausea, vomiting, abdominal pain, diarrhea Genital/Urinary: Denied dysuria, hematuria, urinary frequency/urgency Musculoskeletal: right sided weakness  Skin: Denied rash, wound Neuro: see HPI  Psych: hx of cocaine use  Endocrine: history of diabetes     Physical Exam/Data:   Vitals:   04/11/21 2345 04/12/21 0325 04/12/21 0724 04/12/21 1141  BP: 123/64 (!) 125/59 (!) 141/67 123/60  Pulse: 82 68 66 65  Resp: _0 Temp: 98.2 F (36.8 C) 98.5 F (36.9 C) 98.4 F (36.9 C) 98.6 F (37 C)  TempSrc: Oral Oral Oral Oral  SpO2: 100% 100% 100% 100%  Weight:        Intake/Output Summary (Last 24 hours) at 04/12/2021 1408 Last data filed at 04/12/2021 0445 Gross per 24 hour  Intake --  Output 1150 ml  Net -1150 ml   Last 3 Weights 04/08/2021 03/30/2021 03/29/2021  Weight (lbs) 165 lb 5.5 oz 156 lb 8.4 oz 160 lb 15 oz  Weight (kg) 75 kg 71 kg 73 kg     Body mass index is 29.29 kg/m.   Vitals:  Vitals:   04/12/21 0724 04/12/21 1141  BP: (!) 141/67 123/60  Pulse: 66 65  Resp: 17 18  Temp: 98.4 F (36.9 C) 98.6 F (37 C)  SpO2: 100% 100%   General Appearance: In no apparent distress, laying in bed, frail elderly HEENT: Normocephalic, atraumatic.  Right ptosis.  Neck: Supple, trachea midline, no JVDs Cardiovascular: Regular rate and rhythm, normal S1-S2,  no murmur/rub/gallop Respiratory: Resting breathing unlabored, lungs sounds clear to auscultation bilaterally, no use of accessory muscles. On room air.  No wheezes, rales or rhonchi.   Gastrointestinal: Bowel sounds positive, abdomen soft, non-tender, non-distended.  Extremities: Able to move all  extremities in bed  without difficulty, no edema/cyanosis/clubbing Genitourinary: Foley with concentrated urine Musculoskeletal: Normal muscle bulk and tone Skin: Intact, warm, dry. No rashes or petechiae noted in exposed areas.  Neurologic: Alert, oriented to person, time. Fluent /short speech, no facial droop, mild cognitive deficit, right sided mild weakness Psychiatric: Normal affect. Mood is appropriate.    EKG:  The EKG was personally reviewed and demonstrates:  Sinus rhythm with ventricular rate of 80s  Telemetry:  Telemetry was personally reviewed and demonstrates:  Sinus rhythm with PACs, no a fib noted    Relevant CV Studies:   TTE on 04/11/21:  1. Left ventricular ejection fraction, by estimation, is 25 to 30%. The  left ventricle has severely decreased function. The left ventricle  demonstrates global hypokinesis. There is mild left ventricular  hypertrophy. Left ventricular diastolic parameters   are consistent with Grade I diastolic dysfunction (impaired relaxation).   2. No apical thrombus seen, though in setting of severe LV dysfunction  and presentation with CVA, would consider limited echo with contrast to  rule out LV thrombus   3. Right ventricular systolic function is normal. The right ventricular  size is normal. Tricuspid regurgitation signal is inadequate for assessing  PA pressure.   4. The mitral valve is normal in structure. Trivial mitral valve  regurgitation.   5. The aortic valve is tricuspid. Aortic valve regurgitation is not  visualized. No aortic stenosis is present.   6. The inferior vena cava is normal in size with greater than 50%  respiratory variability, suggesting right atrial pressure of 3 mmHg.   7. Agitated saline contrast bubble study was negative, with no evidence  of any interatrial shunt.   Echo from 03/24/21:  1. Left ventricular ejection fraction, by estimation, is 25 to 30%. Left  ventricular ejection fraction by 2D MOD biplane is  26.6 %. The left  ventricle has severely decreased function. The left ventricle demonstrates  global hypokinesis. Indeterminate  diastolic filling due to E-A fusion.   2. Right ventricular systolic function is mildly reduced. The right  ventricular size is normal. Tricuspid regurgitation signal is inadequate  for assessing PA pressure.   3. The mitral valve is grossly normal. Trivial mitral valve  regurgitation. No evidence of mitral stenosis.   4. The aortic valve is tricuspid. Aortic valve regurgitation is not  visualized. No aortic stenosis is present.   5. There is mild dilatation of the ascending aorta, measuring 42 mm.   6. The inferior vena cava is normal in size with greater than 50%  respiratory variability, suggesting right atrial pressure of 3 mmHg.    Echo from 07/01/2014:  - Left ventricle: The cavity size was normal. Wall thickness was    normal. Systolic function was moderately to severely reduced. The    estimated ejection fraction was in the range of 30% to 35%.    Diffuse hypokinesis. Doppler parameters are consistent with    abnormal left ventricular relaxation (grade 1 diastolic    dysfunction).  - Right atrium: The atrium was mildly dilated.    Laboratory Data:  High Sensitivity Troponin:  No results for input(s): TROPONINIHS in the last 720 hours.   Chemistry Recent Labs  Lab 04/09/21 0500 04/11/21 0039 04/12/21 0626  NA 135 138 137  K 4.4 4.2 4.8  CL 105 106 108  CO2 22 25 21*  GLUCOSE 130* 102* 98  BUN _0 CREATININE 0.88 0.98 1.04  CALCIUM 8.6* 8.7* 9.7  GFRNONAA >60 >60 >60  ANIONGAP _0 Recent Labs  Lab 04/08/21 1907  PROT 6.6  ALBUMIN 2.6*  AST 18  ALT 10  ALKPHOS 53  BILITOT 0.6   Hematology Recent Labs  Lab 04/09/21 0500 04/11/21 0039 04/12/21 0921  WBC 7.3 3.7* 4.7  RBC 3.26* 3.18* 3.35*  HGB 8.6* 8.1* 8.7*  HCT 27.3* 26.3* 27.7*  MCV 83.7 82.7 82.7  MCH 26.4 25.5* 26.0  MCHC 31.5 30.8 31.4  RDW 18.2* 18.8*  18.7*  PLT 266 207 177   BNPNo results for input(s): BNP, PROBNP in the last 168 hours.  DDimer No results for input(s): DDIMER in the last 168 hours.   Radiology/Studies:  MR BRAIN WO CONTRAST  Result Date: 04/10/2021 CLINICAL DATA:  Right-sided weakness and global aphasia. MCA occlusion with revascularization EXAM: MRI HEAD WITHOUT CONTRAST TECHNIQUE: Multiplanar, multiecho pulse sequences of the brain and surrounding structures were obtained without intravenous contrast. COMPARISON:  CT and CTA from 2 days ago FINDINGS: Brain: Small acute cortical infarcts most confluent at the posterior left insula and parietal lobe with mild patchy involvement of the left frontal cortex towards the vertex. Single punctate infarct in the upper right frontal parietal cortex. Confluent chronic small vessel ischemia in the cerebral white matter. Chronic left inferior cerebellar infarct. Chronic deep gray nucleus and deep white matter lacunes with wallerian degeneration crossing the corpus callosum. No acute hemorrhage, hydrocephalus, or mass. Brain atrophy. Chronic blood products at the inferior left cerebellum and left frontal white matter. Vascular: Preserved flow voids. 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 Patchy acute cortical infarction in the left MCA distribution. 2. Punctate right frontal parietal cortex infarct. 3. Advanced chronic small vessel disease. Electronically Signed   By: Monte Fantasia M.D.   On: 04/10/2021 10:06   ECHOCARDIOGRAM COMPLETE BUBBLE STUDY  Result Date: 04/11/2021    ECHOCARDIOGRAM REPORT   Patient Name:   NECHEMIA CHIAPPETTA Date of Exam: 04/11/2021 Medical Rec #:  [id number]        Height:       63.0 in Accession #:    [id number]       Weight:       165.3 lb Date of Birth:  1946/01/31         BSA:          1.783 m Patient Age:    59 years         BP:           132/61 mmHg Patient Gender: M                HR:           66 bpm.  Exam Location:  Inpatient Procedure: 2D Echo, Color Doppler, Cardiac Doppler and Saline Contrast Bubble            Study Indications:    Stroke 434.91 / I63.9  History:        Patient has prior history of Echocardiogram examinations, most                 recent 03/24/2021. Cardiomyopathy and CHF, Stroke; Risk                 Factors:Diabetes, Dyslipidemia and Hypertension.  Sonographer:    Bernadene Person RDCS Referring Phys: [id number] [place]  1. Left ventricular ejection fraction, by estimation, is 25 to 30%. The left ventricle has severely decreased function. The left ventricle  demonstrates global hypokinesis. There is mild left ventricular hypertrophy. Left ventricular diastolic parameters  are consistent with Grade I diastolic dysfunction (impaired relaxation).  2. No apical thrombus seen, though in setting of severe LV dysfunction and presentation with CVA, would consider limited echo with contrast to rule out LV thrombus  3. Right ventricular systolic function is normal. The right ventricular size is normal. Tricuspid regurgitation signal is inadequate for assessing PA pressure.  4. The mitral valve is normal in structure. Trivial mitral valve regurgitation.  5. The aortic valve is tricuspid. Aortic valve regurgitation is not visualized. No aortic stenosis is present.  6. The inferior vena cava is normal in size with greater than 50% respiratory variability, suggesting right atrial pressure of 3 mmHg.  7. Agitated saline contrast bubble study was negative, with no evidence of any interatrial shunt. FINDINGS  Left Ventricle: Left ventricular ejection fraction, by estimation, is 25 to 30%. The left ventricle has severely decreased function. The left ventricle demonstrates global hypokinesis. The left ventricular internal cavity size was normal in size. There is mild left ventricular hypertrophy. Left ventricular diastolic parameters are consistent with Grade I diastolic dysfunction (impaired  relaxation). Right Ventricle: The right ventricular size is normal. No increase in right ventricular wall thickness. Right ventricular systolic function is normal. Tricuspid regurgitation signal is inadequate for assessing PA pressure. Left Atrium: Left atrial size was normal in size. Right Atrium: Right atrial size was normal in size. Pericardium: Trivial pericardial effusion is present. Mitral Valve: The mitral valve is normal in structure. Trivial mitral valve regurgitation. Tricuspid Valve: The tricuspid valve is normal in structure. Tricuspid valve regurgitation is trivial. Aortic Valve: The aortic valve is tricuspid. Aortic valve regurgitation is not visualized. No aortic stenosis is present. Pulmonic Valve: The pulmonic valve was not well visualized. Pulmonic valve regurgitation is not visualized. Aorta: The aortic root is normal in size and structure. Venous: The inferior vena cava is normal in size with greater than 50% respiratory variability, suggesting right atrial pressure of 3 mmHg. IAS/Shunts: No atrial level shunt detected by color flow Doppler. Agitated saline contrast was given intravenously to evaluate for intracardiac shunting. Agitated saline contrast bubble study was negative, with no evidence of any interatrial shunt.  LEFT VENTRICLE PLAX 2D LVIDd:         5.10 cm      Diastology LVIDs:         4.50 cm      LV e' medial:    3.44 cm/s LV PW:         1.20 cm      LV E/e' medial:  13.9 LV IVS:        0.80 cm      LV e' lateral:   5.18 cm/s LVOT diam:     2.40 cm      LV E/e' lateral: 9.2 LV SV:         52 LV SV Index:   29 LVOT Area:     4.52 cm  LV Volumes (MOD) LV vol d, MOD A2C: 119.0 ml LV vol d, MOD A4C: 138.0 ml LV vol s, MOD A2C: 66.7 ml LV vol s, MOD A4C: 102.0 ml LV SV MOD A2C:     52.3 ml LV SV MOD A4C:     138.0 ml LV SV MOD BP:      43.1 ml RIGHT VENTRICLE RV S prime:     9.68 cm/s TAPSE (M-mode): 1.6 cm LEFT ATRIUM  Index       RIGHT ATRIUM           Index LA diam:         3.40 cm 1.91 cm/m  RA Area:     14.20 cm LA Vol (A2C):   42.3 ml 23.72 ml/m RA Volume:   30.40 ml  17.05 ml/m LA Vol (A4C):   40.6 ml 22.77 ml/m LA Biplane Vol: 41.6 ml 23.33 ml/m  AORTIC VALVE LVOT Vmax:   56.90 cm/s LVOT Vmean:  41.400 cm/s LVOT VTI:    0.116 m  AORTA Ao Root diam: 3.40 cm Ao Asc diam:  3.70 cm MITRAL VALVE MV Area (PHT): 1.94 cm    SHUNTS MV Decel Time: 391 msec    Systemic VTI:  0.12 m MV E velocity: 47.70 cm/s  Systemic Diam: 2.40 cm MV A velocity: 53.30 cm/s MV E/A ratio:  0.89 Oswaldo Milian MD Electronically signed by Oswaldo Milian MD Signature Date/Time: 04/11/2021/3:14:30 PM    Final      Assessment and Plan:   Chronic systolic and diastolic heart failure Cardiomyopathy of unclear etiology -History of LV systolic dysfunction with EF 25 to 30% dating back to 2015, etiology was unclear, ? due to hx of cocaine abuse, he never followed up with any cardiologist in the past nor taking GDMT -Last seen 03/27/2021 for inpatient cardiology due to low EF, started on GDMT with Lasix 20 mg every other day, losartan 25 mg daily, and carvedilol 3.125 mg BID, and unable to add Entresto or spironolactone due to low BP. He was arranged to see cardiology on 04/22/21.  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 S/p thrombectomy. Neuro concern of cardioembolic source with low EF, question on [facility] - TTE repeated 04/11/2021 with EF 25 to 30%, LV severely decreased function, LV global hypokinesis, mild LVH, grade 1 DD, no apical thrombus, RV normal, trivial MR, no evidence of intra atrial shunt. (No significant change) - weight is 75kg POA, was 71kg on 03/30/21 discharge - clinically euvolemic today  - if no longer needing permissive HTN, recommend resume GDMT with Coreg 3.164m BID, losartan 271mdaily, [facility], add MRA if BP allows.  - He has no know hx of A fib/flutter, no apical thrombus on TTE, worsening anemia of  unclear etiology, advanced intracranial chronic microvascular ischemic disease, therapeutic dose of anticoagulation is not recommended at this time, will obtain TEE to further valuate for LV thrombus, if negative, plan for loop recorder implant for long term monitor of A fib  - recommend outpatient ischemic evaluation when acute issue resolved with Lexiscan, appt has madeon 04/22/21 - TEE scheduled on Thursday 04/14/21, NPO at midnight prior to procedure   Acute left MCA CVA - managed per neuro  - workup for cardioembolic source as above   HTN - BP mildly elevated, resume Coreg and losartan and lasix   HLD - LDL at goal, continue lipitor 2044maily   Type II DM - A1C at goal, die controlled  Hx of tobacco use and cocaine abuse - patient reports no use of both at this time  Anemia - Hgb dropped from 10 to 8 ranges - consider further workup   BPH Hypothyroidism GERD - managed  per primary team     Risk Assessment/Risk Scores:        New York Heart Association (NYHA) Functional Class NYHA Class I  For questions or updates, please contact [place] Please consult [email or web address] for contact info under    Signed, Margie Billet, NP  04/12/2021 2:08 PM  Patient seen and examined and agree with Margie Billet, NP as detailed above.  In brief, the patient is a  75 y.o. male with a hx of chronic systolic and diastolic heart failure, cardiomyopathy of unclear etiology, HTN, HLD, type 2 DM, BPH, CVA x4, hypothyroidism, tobacco use, recent admission 03/21/21- 03/30/21 at [facility] for sepsis due to left epididymoorchitis with abscess s/p left scrotal exploration with left orchiectomy and drainage of pyocele and TURP, who is currently admitted for acute left MCA CVA s/p thrombectomy with TICI 3 revascularization. Cardiology is consulted for further management of chronic combined systolic and diastolic heart failure and AC management in the setting of possible cardioembolic CVA.  Patient  with recent diagnosis of systolic heart failure with LVEF 25-30% with global hypokinesis that was diagnosed in the setting of sepsis during recent admission. Was planned for ischemic work-up as out-patient but re-presented to the hospital with CVA as detailed in HPI. Notably, the patient has had 4 CVAs in the past. No known history of Afib (no monitor in the system) and no LV thrombus noted on TTE, however, definity was not used. Has not had TEE. Currently he appears compensated from a HF standpoint with no evidence of acute volume overload. Given recurrent CVAs, will plan for TEE to assess for cardioembolic source and possible loop recorder to help guide management of AC going forward.  GEN: Chronically ill appearing, frail, alert and awake Neck: No JVD. Right ptosis Cardiac: RRR, no murmurs, rubs, or gallops.  Respiratory: Clear to auscultation bilaterally. GI: Soft, nontender, non-distended  MS: No edema; No deformity. Neuro:  Mild right sided weakness; answering questions appropriately Psych: Normal affect    Plan: -Plan for TEE to assess for cardioembolic source of embolism -If TEE unrevealing, plan for loop recorder placement for monitoring for Afib given recurrent CVA -Continue coreg 3.123m BID, losartan 255mdaily; can add spiro tomorrow pending BP -Goal to transition to entresto long-term as out-patient  -Plan for ischemic evaluation of reduced LVEF as out-patient; no anginal symptoms currently -Continue ASA 32558maily, plavix 78m108mily and lipitor 20mg10mly -Resume home maintenance lasix 20mg 55m clinically euvolemic on exam  HeatheGwyndolyn Kaufman

## 2021-04-13 LAB — BASIC METABOLIC PANEL
Anion gap: 8 (ref 5–15)
BUN: 13 mg/dL (ref 8–23)
CO2: 23 mmol/L (ref 22–32)
Calcium: 9.2 mg/dL (ref 8.9–10.3)
Chloride: 108 mmol/L (ref 98–111)
Creatinine, Ser: 1 mg/dL (ref 0.61–1.24)
GFR, Estimated: >60 mL/min (ref >60)
Glucose, Bld: 99 mg/dL (ref 70–99)
Potassium: 4.6 mmol/L (ref 3.5–5.1)
Sodium: 139 mmol/L (ref 135–145)

## 2021-04-13 LAB — CBC
HCT: 32.7 % — ABNORMAL LOW (ref 39.0–52.0)
Hemoglobin: 9.5 g/dL — ABNORMAL LOW (ref 13.0–17.0)
MCH: 25.3 pg — ABNORMAL LOW (ref 26.0–34.0)
MCHC: 29.1 g/dL — ABNORMAL LOW (ref 30.0–36.0)
MCV: 87.2 fL (ref 80.0–100.0)
Platelets: 188 10*3/uL (ref 150–400)
RBC: 3.75 MIL/uL — ABNORMAL LOW (ref 4.22–5.81)
RDW: 19.2 % — ABNORMAL HIGH (ref 11.5–15.5)
WBC: 4.6 10*3/uL (ref 4.0–10.5)
nRBC: 0 % (ref 0.0–0.2)

## 2021-04-13 LAB — GLUCOSE, CAPILLARY
Glucose-Capillary: 117 mg/dL — ABNORMAL HIGH (ref 70–99)
Glucose-Capillary: 90 mg/dL (ref 70–99)
Glucose-Capillary: 163 mg/dL — ABNORMAL HIGH (ref 70–99)
Glucose-Capillary: 122 mg/dL — ABNORMAL HIGH (ref 70–99)
Glucose-Capillary: 132 mg/dL — ABNORMAL HIGH (ref 70–99)
Glucose-Capillary: 123 mg/dL — ABNORMAL HIGH (ref 70–99)

## 2021-04-13 MED ORDER — SODIUM CHLORIDE 0.9 % IV SOLN: INTRAVENOUS | Status: DC

## 2021-04-13 NOTE — Progress Notes (Signed)
Occupational Therapy Treatment Patient Details Name: Brad Harrell MRN: [id number] DOB: 1946/09/02 Today's Date: 04/13/2021    History of present illness 75 yo male presented 6/10 with R-sided weakness and global aphasia. Pt found to have L MCA occluded, s/p revacularization 6/10. PMH HLD HTN tobacco abuse polysubstance abuse DM2 CVA 2015 ARF CHF   OT comments  Patient demonstrating good progress toward patient focused OT goals.  He showed improved mobility, needing only supervision for bed mobility, and up to Min A for stand pivot to the recliner.  He was setup only for seated grooming task, and has no real complaints this date.  He remains a little impulsive, and remaining barriers are listed below.  OT is indicated in the acute setting to maximize functional status.  SNF has been recommended, his daughter can only provide limited assist at home, and currently he would need 24 hour assist for basic ADL and mobility.    Follow Up Recommendations  SNF    Equipment Recommendations  None recommended by OT    Recommendations for Other Services      Precautions / Restrictions Precautions Precautions: Fall Precaution Comments: watch HR Restrictions Weight Bearing Restrictions: No       Mobility Bed Mobility Overal bed mobility: Needs Assistance Bed Mobility: Supine to Sit     Supine to sit: Supervision       Patient Response: Impulsive  Transfers Overall transfer level: Needs assistance Equipment used: Rolling walker (2 wheeled) Transfers: Sit to/from [facility] Sit to Stand: Min guard Stand pivot transfers: Min assist       General transfer comment: patient able to push RW, weight shift and move both legs without incident or assist.    Balance Overall balance assessment: Needs assistance Sitting-balance support: Feet supported;No upper extremity supported Sitting balance-Leahy Scale: Fair     Standing balance support: Bilateral upper extremity  supported Standing balance-Leahy Scale: Poor Standing balance comment: reliant on external and UE support.                           ADL either performed or assessed with clinical judgement   ADL       Grooming: Wash/dry hands;Wash/dry face;Oral care;Set up;Sitting               Lower Body Dressing: Minimal assistance;Sitting/lateral leans Lower Body Dressing Details (indicate cue type and reason): don socks             Functional mobility during ADLs: Minimal assistance;Rolling walker       Vision Baseline Vision/History: Legally blind Patient Visual Report: No change from baseline     Perception     Praxis      Cognition Arousal/Alertness: Awake/alert Behavior During Therapy: Impulsive Overall Cognitive Status: Impaired/Different from baseline                       Memory: Decreased short-term memory Following Commands: Follows one step commands with increased time Safety/Judgement: Decreased awareness of safety;Decreased awareness of deficits   Problem Solving: Decreased initiation                            Pertinent Vitals/ Pain       Pain Assessment: No/denies pain  Frequency  Min 2X/week        Progress Toward Goals  OT Goals(current goals can now be found in the care plan section)  Progress towards OT goals: Progressing toward goals  Acute Rehab OT Goals Patient Stated Goal: hoping to return to home with his daughter. OT Goal Formulation: With patient Time For Goal Achievement: 04/23/21 Potential to Achieve Goals: [place] Discharge plan remains appropriate    Co-evaluation                 AM-PAC OT "6 Clicks" Daily Activity     Outcome Measure   Help from another person eating meals?: None Help from another person taking care of personal grooming?: None Help from another person toileting, which includes  using toliet, bedpan, or urinal?: A Little Help from another person bathing (including washing, rinsing, drying)?: A Little Help from another person to put on and taking off regular upper body clothing?: A Little Help from another person to put on and taking off regular lower body clothing?: A Lot 6 Click Score: 19    End of Session Equipment Utilized During Treatment: Rolling walker  OT Visit Diagnosis: Unsteadiness on feet (R26.81);Other abnormalities of gait and mobility (R26.89);Muscle weakness (generalized) (M62.81)   Activity Tolerance Patient tolerated treatment well   Patient Left in chair;with call bell/phone within reach;with chair alarm set   Nurse Communication          Time: 2585-2778 OT Time Calculation (min): 16 min  Charges: OT General Charges $OT Visit: 1 Visit OT Treatments $Self Care/Home Management : 8-22 mins  04/13/2021  [place], OTR/L  Acute Rehabilitation Services  Office:  [phone]    Metta Clines 04/13/2021, 10:56 AM

## 2021-04-13 NOTE — Progress Notes (Signed)
Nutrition Brief Note  Patient identified on the Malnutrition Screening Tool (MST) Report  Admitting Dx: Acute ischemic left MCA stroke (HCC) [I63.512] Cerebrovascular accident (CVA), unspecified mechanism ([place]) [I63.9] Stroke ([place]) [I63.9] Middle cerebral artery embolism, 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]  Past Medical History:  Diagnosis Date   Anemia    Iron Defficiency   Cardiomyopathy ([place])    Combined systolic and diastolic congestive heart failure ([place])    Diabetes mellitus without complication ([place])    TYPE 2   Foley catheter in place    SINCE NOV 2017   H/O cocaine abuse ([place])    Hyperlipidemia    Hypertension    Hypothyroidism    Liver abscess    measuring 7 x 9 cm/notes 03/04/2013   Stroke ([place]) 2010   WEAK ON BOTH SIDES NOW TOTAL OF 3 STROKES    Urinary retention    Scheduled Meds:  aspirin EC  325 mg Oral Daily   atorvastatin  20 mg Oral Daily   carvedilol  3.125 mg Oral BID WC   Chlorhexidine Gluconate Cloth  6 each Topical Daily   clopidogrel  75 mg Oral Daily   enoxaparin (LOVENOX) injection  40 mg Subcutaneous Q24H   finasteride  5 mg Oral Daily   furosemide  20 mg Oral QODAY   insulin aspart  0-9 Units Subcutaneous Q4H   levothyroxine  100 mcg Oral QAC breakfast   losartan  25 mg Oral Daily   mouth rinse  15 mL Mouth Rinse BID   pantoprazole  40 mg Oral QHS   sodium chloride flush  3 mL Intravenous Q12H  Continuous Infusions:  sodium chloride    PRN Meds:.sodium chloride, acetaminophen **OR** acetaminophen (TYLENOL) oral liquid 160 mg/5 mL **OR** acetaminophen, HYDROcodone-acetaminophen, senna-docusate, sodium chloride flush  Labs: Recent Labs  Lab 04/11/21 0039 04/12/21 0626 04/13/21 0532  NA 138 137 139  K 4.2 4.8 4.6  CL 106 108 108  CO2 25 21* 23  BUN 12 13 13   CREATININE 0.98 1.04 1.00  CALCIUM 8.7* 9.7 9.2  GLUCOSE 102* 98 99   Wt Readings from Last 15 Encounters:  04/08/21 75 kg  03/30/21 71 kg  03/15/18 83.9 kg  01/04/17 85.3 kg  10/15/16 81.6 kg   10/15/16 81.6 kg  08/20/16 72.6 kg  07/28/14 88 kg  07/21/14 88.9 kg  07/07/14 90.7 kg  07/02/14 91.5 kg  05/13/13 79.9 kg  04/14/13 79.9 kg  04/11/13 83 kg  03/27/13 83 kg   Body mass index is 29.29 kg/m. Patient meets criteria for overweight based on current BMI.   Discussed pt with RN.   Current diet order is regular, patient is consuming approximately 100% of meals at this time.   No nutrition interventions warranted at this time. If nutrition issues arise, please consult RD.   Larkin Ina, MS, RD, LDN RD pager number and weekend/on-call pager number located in [place].

## 2021-04-13 NOTE — Progress Notes (Addendum)
STROKE TEAM PROGRESS NOTE   INTERVAL HISTORY  No acute events overnight, patient is resting in bed.   Hemoglobin is stable, up trended to the 9 range.   Cardiology wants to evaluate for LV thrombus given reduced EF, plane for TEE tomorrow.   Patients physical examination is stable.  PERTINENT IMAGING   Echocardiogram 03/24/2021:  Left Ventricle: ejection fraction, by estimation, is 25 to 30%. Left ventricular ejection fraction by 2D MOD biplane is 26.6 %.The left ventricle has severely decreased function. The left ventricle demonstrates global hypokinesis. The left ventricular internal cavity size was normal in size. There is no left ventricular hypertrophy. Indeterminate diastolic filling due to E-A fusion.  Right Ventricle: The right ventricular size is normal. No increase in  right ventricular wall thickness. Right ventricular systolic function is  mildly reduced. Tricuspid regurgitation signal is inadequate for assessing PA pressure.  IAS/Shunts: The atrial septum is grossly normal.   04/08/21 CTA Head and Neck  CTA head:  1. Focal short-segment opacification of a left distal M2 MCA branch with occlusion. This vessel is non-opacified more proximally in the sylvian fissure, compatible with a more proximal M2 occlusion or high-grade stenosis. The exact location of M2 occlusion/stenosis is not confidently identified. Catheter arteriogram could further characterize if clinically indicated. 2. Severe right and moderate left paraclinoid ICA narrowing due to calcific atherosclerosis. 3. 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 4. Superimposed moderate right intradural and right P2 PCA stenosis.   CTA neck:  1. Streak artifact limits evaluation proximally with suspected age indeterminate occlusion of bilateral vertebral artery origins with reconstitution in the mid neck. Multifocal narrowing of  the remainder of the right vertebral artery  04/10/21 MRI Brain  Patchy acute cortical infarction in the left MCA distribution. Punctate right frontal parietal cortex infarct. Advanced chronic small vessel disease.  04/11/21 Echo Complete Bubble Study WO Image Enhancing Agent   1. Left ventricular ejection fraction, by estimation, is 25 to 30%. The left ventricle has severely decreased function. The left ventricle demonstrates global hypokinesis. There is mild left ventricular hypertrophy. Left ventricular diastolic parameters are consistent with Grade I diastolic dysfunction (impaired relaxation).   2. No apical thrombus seen, though in setting of severe LV dysfunction and presentation with CVA, would consider limited echo with contrast to rule out LV thrombus   3. Right ventricular systolic function is normal. The right ventricular size is normal. Tricuspid regurgitation signal is inadequate for assessing PA pressure.   4. The mitral valve is normal in structure. Trivial mitral valve regurgitation.   5. The aortic valve is tricuspid. Aortic valve regurgitation is not visualized. No aortic stenosis is present.   6. The inferior vena cava is normal in size with greater than 50% respiratory variability, suggesting right atrial pressure of 3 mmHg.   7. Agitated saline contrast bubble study was negative, with no evidence of any interatrial shunt.   PHYSICAL EXAM MS: Oriented to self and hospital only  Speech: Fluent with naming and repetition intact  CN: EOMI, Blind in the right eye w/dysconjugate gaze, VFF in the left eye, Face symmetric, Tongue midline  Motor: 5/5 strength throughout  Sensation: Intact to light touch  Gait: Deferred   ASSESSMENT/PLAN Mr. AVEL OGAWA is a 75 y.o. male residing in nursing home Hx HLD, HTN, tobacco abuse, and DM II, prior strokes who presents as a stroke code with global aphasia. Did not receive IVTPA due to unknown LKW.  He was found to have a distal L M2  occlusion thus underwent thrombectomy with TICI 3 reperfusion.   #Left MCA Stroke Initial CTH w/NAICP. CTA Head and Neck was pertinent for distal L M2 occlusion versus severe stenosis + severe right and moderate left paraclinoid ICA narrowing due to calcific atherosclerosis. MRI Brain revealed patchy acute cortical infarction in the left MCA distribution. Punctate right frontal parietal cortex infarct. Echocardiogram was notable for reduced EF at 25-30 %, global hypokinesis LA normal in size. Of note, EF was stable compared to Echo done in May. Stroke labs: LDL 53, HgbA1c 5.3. For secondary stroke prevention, given his significant ICAD it was decided to treat him with DAPT ( ASA 325 mg and Plavix 75 mg) for 90 days. His stroke etiology is cryptogenic; atheroembolism versus cardioembolic w/reduced EF  - Continue DAPT for 90 days with ASA 325 mg + Plavix 75 mg then ASA monotherapy after  -  Loop monitor to evaluate for atrial fibrillation   #Hyperlipidemia His LDL is 53 which is at goal < 70 from a stroke reduction stand point. Are continuing home Atorvastatin 20 mg QD.   #Hypertension  #Chronic systolic HF He was diagnosed with HF in May of this year when he presented to the ed with scrotal pain. EF from May admission is stable when compared to EF this admission- 25 to 30 %. Cardiology was consulted for HF regimen optimization + opinion on AC in the setting of reduced EF at 25-30 %. They recommend the following:  - Lasix 20 mg QD, Coreg 3.125 BID, Losartan 25 mg QD  - Will need outpatient ischemic evaluation when acute issue resolved with Lexiscan, appt has made on 04/22/21 - Prophylactic AC is not needed in the absence of LV/LAA thrombus or documented Afib  - TEE 6/16 to evaluate for LV/LAA thrombus- if negative Loop to evaluate for atrial fibrillation   Stroke Dysphagia Screening  Evaluated by SLP on 6/11 and they recommended regular diet with thin liquids. No post stroke dysphagia.  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 Trending hemoglobin given it dropped to 8.1 but appears to have stabilized, currently at 9.5  - Trend hemoglobin  - FFWU if he fevers   #Dispo  PT/OR are recommending SNF. He can return to his prior facility when medically ready.   Hospital day # [place], AGPCNP-BC  Stroke Nurse Practitioner   ATTENDING NOTE: I reviewed above note and agree with the assessment and plan. Pt was seen and examined.   No acute event overnight, neuro stable.  Cardiology on board for TEE and possible loop recorder tomorrow. Now on coreg and losartan. Continue current treatment with DAPT and statin. PT/OT recommend SNF.  For detailed assessment and plan, please refer to above as I have made changes wherever appropriate.   Rosalin Hawking, MD PhD Stroke Neurology 04/13/2021 12:32 PM    To contact Stroke Continuity provider, please refer to [email or web address] After hours, contact General Neurology

## 2021-04-13 NOTE — Progress Notes (Signed)
Physical Therapy Treatment Patient Details Name: Brad Harrell MRN: [id number] DOB: 09-Oct-1946 Today's Date: 04/13/2021    History of Present Illness 75 yo male presented 6/10 with R-sided weakness and global aphasia. Pt found to have L MCA occluded, s/p revacularization 6/10. PMH HLD HTN tobacco abuse polysubstance abuse DM2 CVA 2015 ARF CHF    PT Comments    Pt tolerated treatment well with increased ambulation distances this session. Pt continues to demonstrate impaired cognition and poor device management which place him at an increased risk for falls. Pt will benefit from continued aggressive mobilization and PT POC to improve activity tolerance and device management. PT continues to recommend SNF placement at this time.   Follow Up Recommendations  SNF;Supervision/Assistance - 24 hour     Equipment Recommendations  Rolling walker with 5" wheels (if D/C home)    Recommendations for Other Services       Precautions / Restrictions Precautions Precautions: Fall Precaution Comments: watch HR Restrictions Weight Bearing Restrictions: No    Mobility  Bed Mobility Overal bed mobility: Needs Assistance Bed Mobility: Supine to Sit     Supine to sit: Supervision          Transfers Overall transfer level: Needs assistance Equipment used: Rolling walker (2 wheeled) Transfers: Sit to/from Stand Sit to Stand: Min guard Stand pivot transfers: Min assist       General transfer comment: verbal cues for hand placement  Ambulation/Gait Ambulation/Gait assistance: Min guard Gait Distance (Feet): 100 Feet Assistive device: Rolling walker (2 wheeled) Gait Pattern/deviations: Step-through pattern Gait velocity: reduced Gait velocity interpretation: <1.8 ft/sec, indicate of risk for recurrent falls General Gait Details: pt requiring cues to maintain walker on floor rather than picking it up to advance. Pt ambulates with LLE external rotation, reduced stance time on  LLE   Stairs             Wheelchair Mobility    Modified Rankin (Stroke Patients Only) Modified Rankin (Stroke Patients Only) Pre-Morbid Rankin Score: Moderately severe disability Modified Rankin: Moderately severe disability     Balance Overall balance assessment: Needs assistance Sitting-balance support: No upper extremity supported;Feet supported Sitting balance-Leahy Scale: Fair     Standing balance support: Single extremity supported;Bilateral upper extremity supported Standing balance-Leahy Scale: Poor Standing balance comment: reliant on UE support of RW to maintain dynamic balance                            Cognition Arousal/Alertness: Awake/alert Behavior During Therapy: WFL for tasks assessed/performed Overall Cognitive Status: Impaired/Different from baseline Area of Impairment: Memory;Awareness;Safety/judgement                     Memory: Decreased short-term memory Following Commands: Follows one step commands with increased time Safety/Judgement: Decreased awareness of safety;Decreased awareness of deficits Awareness: Intellectual Problem Solving: Decreased initiation        Exercises      General Comments General comments (skin integrity, edema, etc.): max HR of 144 observed during activity. Recovers to 110s within 1-2 minutes of seated rest      Pertinent Vitals/Pain Pain Assessment: No/denies pain    Home Living                      Prior Function            PT Goals (current goals can now be found in the care plan section) Acute Rehab PT  Goals Patient Stated Goal: to return to independence Progress towards PT goals: Progressing toward goals    Frequency    Min 3X/week      PT Plan Current plan remains appropriate    Co-evaluation              AM-PAC PT "6 Clicks" Mobility   Outcome Measure  Help needed turning from your back to your side while in a flat bed without using bedrails?: A  Little Help needed moving from lying on your back to sitting on the side of a flat bed without using bedrails?: A Little Help needed moving to and from a bed to a chair (including a wheelchair)?: A Little Help needed standing up from a chair using your arms (e.g., wheelchair or bedside chair)?: A Little Help needed to walk in hospital room?: A Little Help needed climbing 3-5 steps with a railing? : A Lot 6 Click Score: 17    End of Session Equipment Utilized During Treatment: Gait belt Activity Tolerance: Patient tolerated treatment well Patient left: in chair;with call bell/phone within reach;with chair alarm set Nurse Communication: Mobility status PT Visit Diagnosis: Unsteadiness on feet (R26.81);Other abnormalities of gait and mobility (R26.89);Muscle weakness (generalized) (M62.81);Difficulty in walking, not elsewhere classified (R26.2);Other symptoms and signs involving the nervous system (R29.898)     Time: 1140-1155 PT Time Calculation (min) (ACUTE ONLY): 15 min  Charges:  $Gait Training: 8-22 mins                     Zenaida Niece, PT, DPT Acute Rehabilitation Pager: [phone]    Zenaida Niece 04/13/2021, 12:22 PM

## 2021-04-13 NOTE — Progress Notes (Addendum)
Progress Note  Patient Name: ABSHIR PAOLINI Date of Encounter: 04/13/2021  [facility] Cardiologist: None   Subjective   Patient states that he is comfortable. No chest pain or SOB.  Blood pressures 120-130s/60s Cr stable at 1  Inpatient Medications    Scheduled Meds:  aspirin EC  325 mg Oral Daily   atorvastatin  20 mg Oral Daily   carvedilol  3.125 mg Oral BID WC   Chlorhexidine Gluconate Cloth  6 each Topical Daily   clopidogrel  75 mg Oral Daily   enoxaparin (LOVENOX) injection  40 mg Subcutaneous Q24H   finasteride  5 mg Oral Daily   furosemide  20 mg Oral QODAY   insulin aspart  0-9 Units Subcutaneous Q4H   levothyroxine  100 mcg Oral QAC breakfast   losartan  25 mg Oral Daily   mouth rinse  15 mL Mouth Rinse BID   pantoprazole  40 mg Oral QHS   sodium chloride flush  3 mL Intravenous Q12H   Continuous Infusions:  sodium chloride     PRN Meds: sodium chloride, acetaminophen **OR** acetaminophen (TYLENOL) oral liquid 160 mg/5 mL **OR** acetaminophen, HYDROcodone-acetaminophen, senna-docusate, sodium chloride flush   Vital Signs    Vitals:   04/12/21 1922 04/12/21 2326 04/13/21 0445 04/13/21 0811  BP: (!) 104/57 121/67 121/67 138/68  Pulse: 69 78 66 60  Resp: 18 18 18    Temp: 98.1 F (36.7 C) 98.4 F (36.9 C) 98.4 F (36.9 C) 98 F (36.7 C)  TempSrc: Oral Oral Oral Oral  SpO2: 100% 100% 100% 100%  Weight:        Intake/Output Summary (Last 24 hours) at 04/13/2021 1035 Last data filed at 04/13/2021 0556 Gross per 24 hour  Intake --  Output 1925 ml  Net -1925 ml   Last 3 Weights 04/08/2021 03/30/2021 03/29/2021  Weight (lbs) 165 lb 5.5 oz 156 lb 8.4 oz 160 lb 15 oz  Weight (kg) 75 kg 71 kg 73 kg      Telemetry    NSR - Personally Reviewed  ECG    No new tracing - Personally Reviewed  Physical Exam   GEN: No acute distress.  Sitting up in a chair Neck: No JVD Cardiac: RRR, no murmurs, rubs, or gallops.  Respiratory: Rhonchorous that  clears with coughing GI: Soft, nontender, non-distended  MS: No edema; No deformity. Neuro:  AAOx2 today Psych: Normal affect   Labs    High Sensitivity Troponin:  No results for input(s): TROPONINIHS in the last 720 hours.    Chemistry Recent Labs  Lab 04/08/21 1907 04/09/21 0500 04/11/21 0039 04/12/21 0626 04/13/21 0532  NA 134*   < > 138 137 139  K 4.9   < > 4.2 4.8 4.6  CL 106   < > 106 108 108  CO2 21*   < > 25 21* 23  GLUCOSE 218*   < > 102* 98 99  BUN 17   < > 12 13 13   CREATININE 0.94   < > 0.98 1.04 1.00  CALCIUM 8.8*   < > 8.7* 9.7 9.2  PROT 6.6  --   --   --   --   ALBUMIN 2.6*  --   --   --   --   AST 18  --   --   --   --   ALT 10  --   --   --   --   ALKPHOS 53  --   --   --   --  BILITOT 0.6  --   --   --   --   GFRNONAA >60   < > >60 >60 >60  ANIONGAP 7   < > 7 8 8    < > = values in this interval not displayed.     Hematology Recent Labs  Lab 04/11/21 0039 04/12/21 0921 04/13/21 0532  WBC 3.7* 4.7 4.6  RBC 3.18* 3.35* 3.75*  HGB 8.1* 8.7* 9.5*  HCT 26.3* 27.7* 32.7*  MCV 82.7 82.7 87.2  MCH 25.5* 26.0 25.3*  MCHC 30.8 31.4 29.1*  RDW 18.8* 18.7* 19.2*  PLT 207 177 188    BNPNo results for input(s): BNP, PROBNP in the last 168 hours.   DDimer No results for input(s): DDIMER in the last 168 hours.   Radiology    ECHOCARDIOGRAM COMPLETE BUBBLE STUDY  Result Date: 04/11/2021    ECHOCARDIOGRAM REPORT   Patient Name:   RAKEEN GAILLARD Date of Exam: 04/11/2021 Medical Rec #:  [id number]        Height:       63.0 in Accession #:    [id number]       Weight:       165.3 lb Date of Birth:  1946/08/26         BSA:          1.783 m Patient Age:    75 years         BP:           132/61 mmHg Patient Gender: M                HR:           66 bpm. Exam Location:  Inpatient Procedure: 2D Echo, Color Doppler, Cardiac Doppler and Saline Contrast Bubble            Study Indications:    Stroke 434.91 / I63.9  History:        Patient has prior history of  Echocardiogram examinations, most                 recent 03/24/2021. Cardiomyopathy and CHF, Stroke; Risk                 Factors:Diabetes, Dyslipidemia and Hypertension.  Sonographer:    Bernadene Person RDCS Referring Phys: [id number] [place]  1. Left ventricular ejection fraction, by estimation, is 25 to 30%. The left ventricle has severely decreased function. The left ventricle demonstrates global hypokinesis. There is mild left ventricular hypertrophy. Left ventricular diastolic parameters  are consistent with Grade I diastolic dysfunction (impaired relaxation).  2. No apical thrombus seen, though in setting of severe LV dysfunction and presentation with CVA, would consider limited echo with contrast to rule out LV thrombus  3. Right ventricular systolic function is normal. The right ventricular size is normal. Tricuspid regurgitation signal is inadequate for assessing PA pressure.  4. The mitral valve is normal in structure. Trivial mitral valve regurgitation.  5. The aortic valve is tricuspid. Aortic valve regurgitation is not visualized. No aortic stenosis is present.  6. The inferior vena cava is normal in size with greater than 50% respiratory variability, suggesting right atrial pressure of 3 mmHg.  7. Agitated saline contrast bubble study was negative, with no evidence of any interatrial shunt. FINDINGS  Left Ventricle: Left ventricular ejection fraction, by estimation, is 25 to 30%. The left ventricle has severely decreased function. The left ventricle demonstrates global hypokinesis. The left ventricular internal cavity size  was normal in size. There is mild left ventricular hypertrophy. Left ventricular diastolic parameters are consistent with Grade I diastolic dysfunction (impaired relaxation). Right Ventricle: The right ventricular size is normal. No increase in right ventricular wall thickness. Right ventricular systolic function is normal. Tricuspid regurgitation signal is  inadequate for assessing PA pressure. Left Atrium: Left atrial size was normal in size. Right Atrium: Right atrial size was normal in size. Pericardium: Trivial pericardial effusion is present. Mitral Valve: The mitral valve is normal in structure. Trivial mitral valve regurgitation. Tricuspid Valve: The tricuspid valve is normal in structure. Tricuspid valve regurgitation is trivial. Aortic Valve: The aortic valve is tricuspid. Aortic valve regurgitation is not visualized. No aortic stenosis is present. Pulmonic Valve: The pulmonic valve was not well visualized. Pulmonic valve regurgitation is not visualized. Aorta: The aortic root is normal in size and structure. Venous: The inferior vena cava is normal in size with greater than 50% respiratory variability, suggesting right atrial pressure of 3 mmHg. IAS/Shunts: No atrial level shunt detected by color flow Doppler. Agitated saline contrast was given intravenously to evaluate for intracardiac shunting. Agitated saline contrast bubble study was negative, with no evidence of any interatrial shunt.  LEFT VENTRICLE PLAX 2D LVIDd:         5.10 cm      Diastology LVIDs:         4.50 cm      LV e' medial:    3.44 cm/s LV PW:         1.20 cm      LV E/e' medial:  13.9 LV IVS:        0.80 cm      LV e' lateral:   5.18 cm/s LVOT diam:     2.40 cm      LV E/e' lateral: 9.2 LV SV:         52 LV SV Index:   29 LVOT Area:     4.52 cm  LV Volumes (MOD) LV vol d, MOD A2C: 119.0 ml LV vol d, MOD A4C: 138.0 ml LV vol s, MOD A2C: 66.7 ml LV vol s, MOD A4C: 102.0 ml LV SV MOD A2C:     52.3 ml LV SV MOD A4C:     138.0 ml LV SV MOD BP:      43.1 ml RIGHT VENTRICLE RV S prime:     9.68 cm/s TAPSE (M-mode): 1.6 cm LEFT ATRIUM             Index       RIGHT ATRIUM           Index LA diam:        3.40 cm 1.91 cm/m  RA Area:     14.20 cm LA Vol (A2C):   42.3 ml 23.72 ml/m RA Volume:   30.40 ml  17.05 ml/m LA Vol (A4C):   40.6 ml 22.77 ml/m LA Biplane Vol: 41.6 ml 23.33 ml/m  AORTIC  VALVE LVOT Vmax:   56.90 cm/s LVOT Vmean:  41.400 cm/s LVOT VTI:    0.116 m  AORTA Ao Root diam: 3.40 cm Ao Asc diam:  3.70 cm MITRAL VALVE MV Area (PHT): 1.94 cm    SHUNTS MV Decel Time: 391 msec    Systemic VTI:  0.12 m MV E velocity: 47.70 cm/s  Systemic Diam: 2.40 cm MV A velocity: 53.30 cm/s MV E/A ratio:  0.89 Oswaldo Milian MD Electronically signed by Oswaldo Milian MD Signature Date/Time: 04/11/2021/3:14:30 PM  Final     Cardiac Studies   TTE on 04/11/21:   1. Left ventricular ejection fraction, by estimation, is 25 to 30%. The  left ventricle has severely decreased function. The left ventricle  demonstrates global hypokinesis. There is mild left ventricular  hypertrophy. Left ventricular diastolic parameters   are consistent with Grade I diastolic dysfunction (impaired relaxation).   2. No apical thrombus seen, though in setting of severe LV dysfunction  and presentation with CVA, would consider limited echo with contrast to  rule out LV thrombus   3. Right ventricular systolic function is normal. The right ventricular  size is normal. Tricuspid regurgitation signal is inadequate for assessing  PA pressure.   4. The mitral valve is normal in structure. Trivial mitral valve  regurgitation.   5. The aortic valve is tricuspid. Aortic valve regurgitation is not  visualized. No aortic stenosis is present.   6. The inferior vena cava is normal in size with greater than 50%  respiratory variability, suggesting right atrial pressure of 3 mmHg.   7. Agitated saline contrast bubble study was negative, with no evidence  of any interatrial shunt.   Echo from 03/24/21:   1. Left ventricular ejection fraction, by estimation, is 25 to 30%. Left  ventricular ejection fraction by 2D MOD biplane is 26.6 %. The left  ventricle has severely decreased function. The left ventricle demonstrates  global hypokinesis. Indeterminate  diastolic filling due to E-A fusion.   2. Right  ventricular systolic function is mildly reduced. The right  ventricular size is normal. Tricuspid regurgitation signal is inadequate  for assessing PA pressure.   3. The mitral valve is grossly normal. Trivial mitral valve  regurgitation. No evidence of mitral stenosis.   4. The aortic valve is tricuspid. Aortic valve regurgitation is not  visualized. No aortic stenosis is present.   5. There is mild dilatation of the ascending aorta, measuring 42 mm.   6. The inferior vena cava is normal in size with greater than 50%  respiratory variability, suggesting right atrial pressure of 3 mmHg.     Echo from 07/01/2014:  - Left ventricle: The cavity size was normal. Wall thickness was    normal. Systolic function was moderately to severely reduced. The    estimated ejection fraction was in the range of 30% to 35%.    Diffuse hypokinesis. Doppler parameters are consistent with    abnormal left ventricular relaxation (grade 1 diastolic    dysfunction).  - Right atrium: The atrium was mildly dilated.    Patient Profile     75 y.o. male with a hx of chronic systolic and diastolic heart failure, cardiomyopathy of unclear etiology, HTN, HLD, type 2 DM, BPH, CVA x4, hypothyroidism, tobacco use, recent admission 03/21/21- 03/30/21 at [facility] for sepsis due to left epididymoorchitis with abscess s/p left scrotal exploration with left orchiectomy and drainage of pyocele and TURP, who is currently admitted for acute left MCA CVA s/p thrombectomy with TICI 3 revascularization. Cardiology is consulted for further management of chronic combined systolic and diastolic heart failure and AC management in the setting of possible cardioembolic CVA.  Assessment & Plan   #Chronic systolic and diastolic heart failure #Cardiomyopathy of unclear etiology Patient with known history of LV systolic dysfunction with EF 25 to 30% dating back to 2015, etiology was unclear, ? due to hx of cocaine abuse, he never followed up with  Cardiology in the past in the past nor had been started on GDMT. Last  seen 03/27/2021 by inpatient cardiology due to low EF, started on GDMT with Lasix 20 mg every other day, losartan 25 mg daily, and carvedilol 3.125 mg BID, and unable to add Entresto or spironolactone due to low BP. He was arranged to see cardiology on 04/22/21, however, he re-presented with acute left MCA CVA on 04/08/21 s/p thrombectomy. Repeat TTE 04/11/2021 with EF 25 to 30%, LV severely decreased function, LV global hypokinesis, mild LVH, grade 1 DD, no apical thrombus, RV normal, trivial MR, no evidence of intra atrial shunt. (No significant change from prior). Currently appears compensated on examination without evidence of volume overload. Will merit ischemic work-up as out-patient once clinically improved.  - Continue home lasix 20mg  QOD as maintenance diuretic - Continue Coreg 3.125mg  BID, losartan 25mg  daily - Did not tolerate spiro/entresto on last admission due to hypotension; will add as able - Will need outpatient ischemic evaluation when acute issue resolved with Lexiscan, appt has made on 04/22/21   #Acute left MCA CVA Patient with history of recurrent CVA with current admission for left MCA CVA s/p thrombectomy. No known history of Afib. TTE without evidence of LV thrombus, however, definity not used. Has not had TEE before. Will perform TEE to assess for cardioembolic source. If negative, likely needs loop. -Check TEE for cardioembolic source; plan for tomorrow -If negative, plan for loop for monitoring for Afib -Prophylactic AC not needed in the absence of LV/LAA thrombus or documented Afib -Continue ASA 325mg  daily and plavix 75mg  daily -Continue lipitor 20mg  daily   #HTN - Continue Coreg 3.125mg  BID, losartan 25mg  daily   #HLD - LDL at goal, continue lipitor 20mg  daily    #Type II DM - A1C at goal, diet controlled   #Hx of tobacco use and cocaine abuse - patient reports no use of both at this time    #Anemia - Management per primary   #BPH #Hypothyroidism #GERD - Managed  per primary team   Discussed TEE and possible loop recorder with the patient at bedside today. He is amenable to proceed. Also obtained consent from the patient's daughter yesterday. Will keep NPO after MN.    For questions or updates, please contact [place] Please consult [email or web address] for contact info under        Signed, Freada Bergeron, MD  04/13/2021, 10:35 AM   Pro

## 2021-04-14 ENCOUNTER — Encounter (HOSPITAL_COMMUNITY)
Admission: EM | Disposition: A | Discharge status: Skilled Nursing Facility | Payer: Self-pay | Source: Home / Self Care | Attending: Neurology

## 2021-04-14 ENCOUNTER — Inpatient Hospital Stay (HOSPITAL_COMMUNITY): Payer: Medicare Other | Admitting: Anesthesiology

## 2021-04-14 ENCOUNTER — Encounter (HOSPITAL_COMMUNITY): Payer: Self-pay | Admitting: Neurology

## 2021-04-14 ENCOUNTER — Inpatient Hospital Stay (HOSPITAL_COMMUNITY): Payer: Medicare Other

## 2021-04-14 DIAGNOSIS — I255 Ischemic cardiomyopathy: Secondary | ICD-10-CM

## 2021-04-14 DIAGNOSIS — I10 Essential (primary) hypertension: Secondary | ICD-10-CM

## 2021-04-14 DIAGNOSIS — I639 Cerebral infarction, unspecified: Secondary | ICD-10-CM

## 2021-04-14 DIAGNOSIS — I6602 Occlusion and stenosis of left middle cerebral artery: Secondary | ICD-10-CM

## 2021-04-14 HISTORY — PX: TEE WITHOUT CARDIOVERSION: SHX5443

## 2021-04-14 HISTORY — PX: BUBBLE STUDY: SHX6837

## 2021-04-14 LAB — GLUCOSE, CAPILLARY
Glucose-Capillary: 93 mg/dL (ref 70–99)
Glucose-Capillary: 76 mg/dL (ref 70–99)
Glucose-Capillary: 114 mg/dL — ABNORMAL HIGH (ref 70–99)
Glucose-Capillary: 116 mg/dL — ABNORMAL HIGH (ref 70–99)

## 2021-04-14 LAB — BASIC METABOLIC PANEL
Anion gap: 6 (ref 5–15)
BUN: 20 mg/dL (ref 8–23)
CO2: 24 mmol/L (ref 22–32)
Calcium: 8.9 mg/dL (ref 8.9–10.3)
Chloride: 106 mmol/L (ref 98–111)
Creatinine, Ser: 1.11 mg/dL (ref 0.61–1.24)
GFR, Estimated: >60 mL/min (ref >60)
Glucose, Bld: 115 mg/dL — ABNORMAL HIGH (ref 70–99)
Potassium: 4.3 mmol/L (ref 3.5–5.1)
Sodium: 136 mmol/L (ref 135–145)

## 2021-04-14 LAB — CBC
HCT: 28.6 % — ABNORMAL LOW (ref 39.0–52.0)
Hemoglobin: 8.8 g/dL — ABNORMAL LOW (ref 13.0–17.0)
MCH: 26.2 pg (ref 26.0–34.0)
MCHC: 30.8 g/dL (ref 30.0–36.0)
MCV: 85.1 fL (ref 80.0–100.0)
Platelets: 207 10*3/uL (ref 150–400)
RBC: 3.36 MIL/uL — ABNORMAL LOW (ref 4.22–5.81)
RDW: 19.3 % — ABNORMAL HIGH (ref 11.5–15.5)
WBC: 4.5 10*3/uL (ref 4.0–10.5)
nRBC: 0 % (ref 0.0–0.2)

## 2021-04-14 SURGERY — ECHOCARDIOGRAM, TRANSESOPHAGEAL
Anesthesia: Monitor Anesthesia Care

## 2021-04-14 MED ORDER — PHENYLEPHRINE 40 MCG/ML (10ML) SYRINGE FOR IV PUSH (FOR BLOOD PRESSURE SUPPORT)
PREFILLED_SYRINGE | INTRAVENOUS | Status: DC | PRN
  Administered 2021-04-14 (×3): 80 ug via INTRAVENOUS

## 2021-04-14 MED ORDER — PROPOFOL 500 MG/50ML IV EMUL
INTRAVENOUS | Status: DC | PRN
  Administered 2021-04-14: 150 ug/kg/min via INTRAVENOUS

## 2021-04-14 MED ORDER — BUTAMBEN-TETRACAINE-BENZOCAINE 2-2-14 % EX AERO
INHALATION_SPRAY | CUTANEOUS | Status: DC | PRN
  Administered 2021-04-14: 1 via TOPICAL

## 2021-04-14 MED ORDER — SPIRONOLACTONE 12.5 MG HALF TABLET
12.5000 mg | ORAL_TABLET | Freq: Every day | ORAL | Status: DC
  Administered 2021-04-14 – 2021-04-15 (×2): 12.5 mg via ORAL
  Filled 2021-04-14 (×2): qty 1

## 2021-04-14 NOTE — Progress Notes (Addendum)
Progress Note  Patient Name: BURNARD ENIS Date of Encounter: 04/14/2021  [facility] Cardiologist: None   Subjective   Patient states he is doing well. He is able to communicate verbally. He denied any chest pain or SOB. He is agreeable with TEE and is waiting for the procedure.   Inpatient Medications    Scheduled Meds:  aspirin EC  325 mg Oral Daily   atorvastatin  20 mg Oral Daily   carvedilol  3.125 mg Oral BID WC   Chlorhexidine Gluconate Cloth  6 each Topical Daily   clopidogrel  75 mg Oral Daily   enoxaparin (LOVENOX) injection  40 mg Subcutaneous Q24H   finasteride  5 mg Oral Daily   furosemide  20 mg Oral QODAY   insulin aspart  0-9 Units Subcutaneous Q4H   levothyroxine  100 mcg Oral QAC breakfast   losartan  25 mg Oral Daily   mouth rinse  15 mL Mouth Rinse BID   pantoprazole  40 mg Oral QHS   sodium chloride flush  3 mL Intravenous Q12H   Continuous Infusions:  sodium chloride     sodium chloride 20 mL/hr at 04/13/21 1822   PRN Meds: sodium chloride, acetaminophen **OR** acetaminophen (TYLENOL) oral liquid 160 mg/5 mL **OR** acetaminophen, HYDROcodone-acetaminophen, senna-docusate, sodium chloride flush   Vital Signs    Vitals:   04/13/21 2047 04/13/21 2321 04/14/21 0418 04/14/21 0737  BP: (!) 108/52 131/63 119/66 133/62  Pulse: 65 87 64 65  Resp: 18 18 18 18   Temp: 98.1 F (36.7 C) 98.5 F (36.9 C) 98.6 F (37 C) 97.8 F (36.6 C)  TempSrc: Oral Oral Oral Oral  SpO2: 100% 100% 100% 99%  Weight:        Intake/Output Summary (Last 24 hours) at 04/14/2021 1017 Last data filed at 04/14/2021 0427 Gross per 24 hour  Intake --  Output 925 ml  Net -925 ml   Last 3 Weights 04/08/2021 03/30/2021 03/29/2021  Weight (lbs) 165 lb 5.5 oz 156 lb 8.4 oz 160 lb 15 oz  Weight (kg) 75 kg 71 kg 73 kg      Telemetry    NSR , noted intermittent sinus tachycardia 130s during the past 24hours- Personally Reviewed  ECG    No new tracing - Personally  Reviewed  Physical Exam   GEN: No acute distress. Laying in bed  Neck: No JVD Cardiac: RRR, no murmurs, rubs, or gallops.  Respiratory: lung sound clear bilateraly  GI: Soft, nontender, non-distended  MS: No edema; No deformity. Neuro:  Alert, oriented to person/place, answer yes and no question appropriately, right ptosis  Psych: Normal affect  Foley with concentrated urine   Labs    High Sensitivity Troponin:  No results for input(s): TROPONINIHS in the last 720 hours.    Chemistry Recent Labs  Lab 04/08/21 1907 04/09/21 0500 04/12/21 0626 04/13/21 0532 04/14/21 0133  NA 134*   < > 137 139 136  K 4.9   < > 4.8 4.6 4.3  CL 106   < > 108 108 106  CO2 21*   < > 21* 23 24  GLUCOSE 218*   < > 98 99 115*  BUN 17   < > 13 13 20   CREATININE 0.94   < > 1.04 1.00 1.11  CALCIUM 8.8*   < > 9.7 9.2 8.9  PROT 6.6  --   --   --   --   ALBUMIN 2.6*  --   --   --   --  AST 18  --   --   --   --   ALT 10  --   --   --   --   ALKPHOS 53  --   --   --   --   BILITOT 0.6  --   --   --   --   GFRNONAA >60   < > >60 >60 >60  ANIONGAP 7   < > 8 8 6    < > = values in this interval not displayed.     Hematology Recent Labs  Lab 04/12/21 0921 04/13/21 0532 04/14/21 0133  WBC 4.7 4.6 4.5  RBC 3.35* 3.75* 3.36*  HGB 8.7* 9.5* 8.8*  HCT 27.7* 32.7* 28.6*  MCV 82.7 87.2 85.1  MCH 26.0 25.3* 26.2  MCHC 31.4 29.1* 30.8  RDW 18.7* 19.2* 19.3*  PLT 177 188 207    BNPNo results for input(s): BNP, PROBNP in the last 168 hours.   DDimer No results for input(s): DDIMER in the last 168 hours.   Radiology    No results found.  Cardiac Studies   TTE on 04/11/21:   1. Left ventricular ejection fraction, by estimation, is 25 to 30%. The  left ventricle has severely decreased function. The left ventricle  demonstrates global hypokinesis. There is mild left ventricular  hypertrophy. Left ventricular diastolic parameters   are consistent with Grade I diastolic dysfunction (impaired  relaxation).   2. No apical thrombus seen, though in setting of severe LV dysfunction  and presentation with CVA, would consider limited echo with contrast to  rule out LV thrombus   3. Right ventricular systolic function is normal. The right ventricular  size is normal. Tricuspid regurgitation signal is inadequate for assessing  PA pressure.   4. The mitral valve is normal in structure. Trivial mitral valve  regurgitation.   5. The aortic valve is tricuspid. Aortic valve regurgitation is not  visualized. No aortic stenosis is present.   6. The inferior vena cava is normal in size with greater than 50%  respiratory variability, suggesting right atrial pressure of 3 mmHg.   7. Agitated saline contrast bubble study was negative, with no evidence  of any interatrial shunt.   Echo from 03/24/21:   1. Left ventricular ejection fraction, by estimation, is 25 to 30%. Left  ventricular ejection fraction by 2D MOD biplane is 26.6 %. The left  ventricle has severely decreased function. The left ventricle demonstrates  global hypokinesis. Indeterminate  diastolic filling due to E-A fusion.   2. Right ventricular systolic function is mildly reduced. The right  ventricular size is normal. Tricuspid regurgitation signal is inadequate  for assessing PA pressure.   3. The mitral valve is grossly normal. Trivial mitral valve  regurgitation. No evidence of mitral stenosis.   4. The aortic valve is tricuspid. Aortic valve regurgitation is not  visualized. No aortic stenosis is present.   5. There is mild dilatation of the ascending aorta, measuring 42 mm.   6. The inferior vena cava is normal in size with greater than 50%  respiratory variability, suggesting right atrial pressure of 3 mmHg.     Echo from 07/01/2014:  - Left ventricle: The cavity size was normal. Wall thickness was    normal. Systolic function was moderately to severely reduced. The    estimated ejection fraction was in the range of  30% to 35%.    Diffuse hypokinesis. Doppler parameters are consistent with    abnormal left ventricular  relaxation (grade 1 diastolic    dysfunction).  - Right atrium: The atrium was mildly dilated.    Patient Profile     75 y.o. male with a hx of chronic systolic and diastolic heart failure, cardiomyopathy of unclear etiology, HTN, HLD, type 2 DM, BPH, CVA x4, hypothyroidism, tobacco use, recent admission 03/21/21- 03/30/21 at [facility] for sepsis due to left epididymoorchitis with abscess s/p left scrotal exploration with left orchiectomy and drainage of pyocele and TURP, who is currently admitted for acute left MCA CVA s/p thrombectomy with TICI 3 revascularization. Cardiology is consulted for further management of chronic combined systolic and diastolic heart failure and AC management in the setting of possible cardioembolic CVA.  Assessment & Plan   #Chronic systolic and diastolic heart failure #Cardiomyopathy of unclear etiology Patient with known history of LV systolic dysfunction with EF 25 to 30% dating back to 2015, etiology was unclear, ? due to hx of cocaine abuse, he never followed up with Cardiology in the past in the past nor had been started on GDMT. Last seen 03/27/2021 by inpatient cardiology due to low EF, started on GDMT with Lasix 20 mg every other day, losartan 25 mg daily, and carvedilol 3.125 mg BID, and unable to add Entresto or spironolactone due to low BP. He was arranged to see cardiology on 04/22/21, however, he re-presented with acute left MCA CVA on 04/08/21 s/p thrombectomy. Repeat TTE 04/11/2021 with EF 25 to 30%, LV severely decreased function, LV global hypokinesis, mild LVH, grade 1 DD, no apical thrombus, RV normal, trivial MR, no evidence of intra atrial shunt. (No significant change from prior). Currently appears compensated on examination without evidence of volume overload. Will merit ischemic work-up as out-patient once clinically improved.  - Continue home lasix 20mg  QOD  as maintenance diuretic - Continue Coreg 3.125mg  BID, losartan 25mg  daily - Did not tolerate spiro/entresto on last admission due to hypotension; will add as able - Will need outpatient ischemic evaluation when acute issue resolved with Lexiscan, appt has made on 04/22/21   #Acute left MCA CVA Patient with history of recurrent CVA with current admission for left MCA CVA s/p thrombectomy. No known history of Afib. TTE without evidence of LV thrombus, however, definity not used. Has not had TEE before. Will perform TEE to assess for cardioembolic source. If negative, likely needs loop implant  - TEE scheduled today at 1PM -If negative, plan for loop for monitoring for Afib -Prophylactic AC not needed in the absence of LV/LAA thrombus or documented Afib -Continue ASA 325mg  daily and plavix 75mg  daily -Continue lipitor 20mg  daily   #HTN - Continue Coreg 3.125mg  BID, losartan 25mg  daily   #HLD - LDL at goal, continue lipitor 20mg  daily    #Type II DM - A1C at goal, diet controlled   #Hx of tobacco use and cocaine abuse - patient reports no use of both at this time   #Anemia - Management per primary   #BPH #Hypothyroidism #GERD - Managed  per primary team     For questions or updates, please contact [facility] Please consult [email or web address] for contact info under        Signed, Margie Billet, NP  04/14/2021, 10:17 AM     Patient seen and examined and agree with Margie Billet, NP as detailed above.   In brief, the patient is a  75 y.o. male with a hx of chronic systolic and diastolic heart failure, cardiomyopathy of unclear etiology, HTN, HLD, type 2 DM, BPH, CVA  x4, hypothyroidism, tobacco use, recent admission 03/21/21- 03/30/21 at [facility] for sepsis due to left epididymoorchitis with abscess s/p left scrotal exploration with left orchiectomy and drainage of pyocele and TURP, who is currently admitted for acute left MCA CVA s/p thrombectomy with TICI 3 revascularization. Cardiology is  consulted for further management of chronic combined systolic and diastolic heart failure and AC management in the setting of possible cardioembolic CVA.  Patient with recent diagnosis of systolic heart failure with LVEF 25-30% with global hypokinesis that was diagnosed in the setting of sepsis during recent admission. Was planned for ischemic work-up as out-patient but re-presented to the hospital with CVA. Notably, the patient has had 4 CVAs in the past. No known history of Afib (no monitor in the system) and no LV thrombus noted on TTE, however, definity was not used.   Planned for TEE today given recurrent CVAs. Discussed with EP, will plan for 30day event monitor for Afib monitoring. Given reduced EF, may need ICD in the future and would like to avoid loop if ultimately needs a device. If EF improves with GDMT, will proceed with loop at that time (can be done as out-patient).  Exam: GEN: Chronically ill appearing, frail, alert and awake Neck: No JVD. Right ptosis Cardiac: RRR, no murmurs, rubs, or gallops. Respiratory: Clear to auscultation bilaterally. GI: Soft, nontender, non-distended MS: No edema; No deformity. Neuro:  Alert and oriented to person and place Psych: Normal affect     Plan: -TEE today to assess for cardiac source of embolism -Will proceed with 30 day event monitor for Afib over loop after discussion with EP as patient has reduced LVEF and may ultimately need a ICD -If EF recovers on GDMT, can proceed with loop recorder as out-patient if 30d monitor unrevealing - Continue home lasix 20mg  QOD as maintenance diuretic - Continue Coreg 3.125mg  BID, losartan 25mg  daily - Start spironolactone 12.5mg  daily - Will change losartan to entresto as able - Goal to add SGLT2i as able; may not need scheduled diuretic if added - Planned for outpatient ischemic evaluation when acute issue resolved with Lexiscan, appt has made on 04/22/21 - Continue ASA 325mg  daily and plavix 75mg   daily - Continue lipitor 20mg  daily  Gwyndolyn Kaufman, MD

## 2021-04-14 NOTE — H&P (View-Only) (Signed)
Progress Note  Patient Name: Brad Harrell Date of Encounter: 04/14/2021  [facility] Cardiologist: None   Subjective   Patient states he is doing well. He is able to communicate verbally. He denied any chest pain or SOB. He is agreeable with TEE and is waiting for the procedure.   Inpatient Medications    Scheduled Meds:  aspirin EC  325 mg Oral Daily   atorvastatin  20 mg Oral Daily   carvedilol  3.125 mg Oral BID WC   Chlorhexidine Gluconate Cloth  6 each Topical Daily   clopidogrel  75 mg Oral Daily   enoxaparin (LOVENOX) injection  40 mg Subcutaneous Q24H   finasteride  5 mg Oral Daily   furosemide  20 mg Oral QODAY   insulin aspart  0-9 Units Subcutaneous Q4H   levothyroxine  100 mcg Oral QAC breakfast   losartan  25 mg Oral Daily   mouth rinse  15 mL Mouth Rinse BID   pantoprazole  40 mg Oral QHS   sodium chloride flush  3 mL Intravenous Q12H   Continuous Infusions:  sodium chloride     sodium chloride 20 mL/hr at 04/13/21 1822   PRN Meds: sodium chloride, acetaminophen **OR** acetaminophen (TYLENOL) oral liquid 160 mg/5 mL **OR** acetaminophen, HYDROcodone-acetaminophen, senna-docusate, sodium chloride flush   Vital Signs    Vitals:   04/13/21 2047 04/13/21 2321 04/14/21 0418 04/14/21 0737  BP: (!) 108/52 131/63 119/66 133/62  Pulse: 65 87 64 65  Resp: 18 18 18 18   Temp: 98.1 F (36.7 C) 98.5 F (36.9 C) 98.6 F (37 C) 97.8 F (36.6 C)  TempSrc: Oral Oral Oral Oral  SpO2: 100% 100% 100% 99%  Weight:        Intake/Output Summary (Last 24 hours) at 04/14/2021 1017 Last data filed at 04/14/2021 0427 Gross per 24 hour  Intake --  Output 925 ml  Net -925 ml   Last 3 Weights 04/08/2021 03/30/2021 03/29/2021  Weight (lbs) 165 lb 5.5 oz 156 lb 8.4 oz 160 lb 15 oz  Weight (kg) 75 kg 71 kg 73 kg      Telemetry    NSR , noted intermittent sinus tachycardia 130s during the past 24hours- Personally Reviewed  ECG    No new tracing - Personally  Reviewed  Physical Exam   GEN: No acute distress. Laying in bed  Neck: No JVD Cardiac: RRR, no murmurs, rubs, or gallops.  Respiratory: lung sound clear bilateraly  GI: Soft, nontender, non-distended  MS: No edema; No deformity. Neuro:  Alert, oriented to person/place, answer yes and no question appropriately, right ptosis  Psych: Normal affect  Foley with concentrated urine   Labs    High Sensitivity Troponin:  No results for input(s): TROPONINIHS in the last 720 hours.    Chemistry Recent Labs  Lab 04/08/21 1907 04/09/21 0500 04/12/21 0626 04/13/21 0532 04/14/21 0133  NA 134*   < > 137 139 136  K 4.9   < > 4.8 4.6 4.3  CL 106   < > 108 108 106  CO2 21*   < > 21* 23 24  GLUCOSE 218*   < > 98 99 115*  BUN 17   < > 13 13 20   CREATININE 0.94   < > 1.04 1.00 1.11  CALCIUM 8.8*   < > 9.7 9.2 8.9  PROT 6.6  --   --   --   --   ALBUMIN 2.6*  --   --   --   --  AST 18  --   --   --   --   ALT 10  --   --   --   --   ALKPHOS 53  --   --   --   --   BILITOT 0.6  --   --   --   --   GFRNONAA >60   < > >60 >60 >60  ANIONGAP 7   < > 8 8 6    < > = values in this interval not displayed.     Hematology Recent Labs  Lab 04/12/21 0921 04/13/21 0532 04/14/21 0133  WBC 4.7 4.6 4.5  RBC 3.35* 3.75* 3.36*  HGB 8.7* 9.5* 8.8*  HCT 27.7* 32.7* 28.6*  MCV 82.7 87.2 85.1  MCH 26.0 25.3* 26.2  MCHC 31.4 29.1* 30.8  RDW 18.7* 19.2* 19.3*  PLT 177 188 207    BNPNo results for input(s): BNP, PROBNP in the last 168 hours.   DDimer No results for input(s): DDIMER in the last 168 hours.   Radiology    No results found.  Cardiac Studies   TTE on 04/11/21:   1. Left ventricular ejection fraction, by estimation, is 25 to 30%. The  left ventricle has severely decreased function. The left ventricle  demonstrates global hypokinesis. There is mild left ventricular  hypertrophy. Left ventricular diastolic parameters   are consistent with Grade I diastolic dysfunction (impaired  relaxation).   2. No apical thrombus seen, though in setting of severe LV dysfunction  and presentation with CVA, would consider limited echo with contrast to  rule out LV thrombus   3. Right ventricular systolic function is normal. The right ventricular  size is normal. Tricuspid regurgitation signal is inadequate for assessing  PA pressure.   4. The mitral valve is normal in structure. Trivial mitral valve  regurgitation.   5. The aortic valve is tricuspid. Aortic valve regurgitation is not  visualized. No aortic stenosis is present.   6. The inferior vena cava is normal in size with greater than 50%  respiratory variability, suggesting right atrial pressure of 3 mmHg.   7. Agitated saline contrast bubble study was negative, with no evidence  of any interatrial shunt.   Echo from 03/24/21:   1. Left ventricular ejection fraction, by estimation, is 25 to 30%. Left  ventricular ejection fraction by 2D MOD biplane is 26.6 %. The left  ventricle has severely decreased function. The left ventricle demonstrates  global hypokinesis. Indeterminate  diastolic filling due to E-A fusion.   2. Right ventricular systolic function is mildly reduced. The right  ventricular size is normal. Tricuspid regurgitation signal is inadequate  for assessing PA pressure.   3. The mitral valve is grossly normal. Trivial mitral valve  regurgitation. No evidence of mitral stenosis.   4. The aortic valve is tricuspid. Aortic valve regurgitation is not  visualized. No aortic stenosis is present.   5. There is mild dilatation of the ascending aorta, measuring 42 mm.   6. The inferior vena cava is normal in size with greater than 50%  respiratory variability, suggesting right atrial pressure of 3 mmHg.     Echo from 07/01/2014:  - Left ventricle: The cavity size was normal. Wall thickness was    normal. Systolic function was moderately to severely reduced. The    estimated ejection fraction was in the range of  30% to 35%.    Diffuse hypokinesis. Doppler parameters are consistent with    abnormal left ventricular  relaxation (grade 1 diastolic    dysfunction).  - Right atrium: The atrium was mildly dilated.    Patient Profile     75 y.o. male with a hx of chronic systolic and diastolic heart failure, cardiomyopathy of unclear etiology, HTN, HLD, type 2 DM, BPH, CVA x4, hypothyroidism, tobacco use, recent admission 03/21/21- 03/30/21 at [facility] for sepsis due to left epididymoorchitis with abscess s/p left scrotal exploration with left orchiectomy and drainage of pyocele and TURP, who is currently admitted for acute left MCA CVA s/p thrombectomy with TICI 3 revascularization. Cardiology is consulted for further management of chronic combined systolic and diastolic heart failure and AC management in the setting of possible cardioembolic CVA.  Assessment & Plan   #Chronic systolic and diastolic heart failure #Cardiomyopathy of unclear etiology Patient with known history of LV systolic dysfunction with EF 25 to 30% dating back to 2015, etiology was unclear, ? due to hx of cocaine abuse, he never followed up with Cardiology in the past in the past nor had been started on GDMT. Last seen 03/27/2021 by inpatient cardiology due to low EF, started on GDMT with Lasix 20 mg every other day, losartan 25 mg daily, and carvedilol 3.125 mg BID, and unable to add Entresto or spironolactone due to low BP. He was arranged to see cardiology on 04/22/21, however, he re-presented with acute left MCA CVA on 04/08/21 s/p thrombectomy. Repeat TTE 04/11/2021 with EF 25 to 30%, LV severely decreased function, LV global hypokinesis, mild LVH, grade 1 DD, no apical thrombus, RV normal, trivial MR, no evidence of intra atrial shunt. (No significant change from prior). Currently appears compensated on examination without evidence of volume overload. Will merit ischemic work-up as out-patient once clinically improved.  - Continue home lasix 20mg  QOD  as maintenance diuretic - Continue Coreg 3.125mg  BID, losartan 25mg  daily - Did not tolerate spiro/entresto on last admission due to hypotension; will add as able - Will need outpatient ischemic evaluation when acute issue resolved with Lexiscan, appt has made on 04/22/21   #Acute left MCA CVA Patient with history of recurrent CVA with current admission for left MCA CVA s/p thrombectomy. No known history of Afib. TTE without evidence of LV thrombus, however, definity not used. Has not had TEE before. Will perform TEE to assess for cardioembolic source. If negative, likely needs loop implant  - TEE scheduled today at 1PM -If negative, plan for loop for monitoring for Afib -Prophylactic AC not needed in the absence of LV/LAA thrombus or documented Afib -Continue ASA 325mg  daily and plavix 75mg  daily -Continue lipitor 20mg  daily   #HTN - Continue Coreg 3.125mg  BID, losartan 25mg  daily   #HLD - LDL at goal, continue lipitor 20mg  daily    #Type II DM - A1C at goal, diet controlled   #Hx of tobacco use and cocaine abuse - patient reports no use of both at this time   #Anemia - Management per primary   #BPH #Hypothyroidism #GERD - Managed  per primary team     For questions or updates, please contact [facility] Please consult [email or web address] for contact info under        Signed, Margie Billet, NP  04/14/2021, 10:17 AM     Patient seen and examined and agree with Margie Billet, NP as detailed above.   In brief, the patient is a  75 y.o. male with a hx of chronic systolic and diastolic heart failure, cardiomyopathy of unclear etiology, HTN, HLD, type 2 DM, BPH, CVA  x4, hypothyroidism, tobacco use, recent admission 03/21/21- 03/30/21 at [facility] for sepsis due to left epididymoorchitis with abscess s/p left scrotal exploration with left orchiectomy and drainage of pyocele and TURP, who is currently admitted for acute left MCA CVA s/p thrombectomy with TICI 3 revascularization. Cardiology is  consulted for further management of chronic combined systolic and diastolic heart failure and AC management in the setting of possible cardioembolic CVA.  Patient with recent diagnosis of systolic heart failure with LVEF 25-30% with global hypokinesis that was diagnosed in the setting of sepsis during recent admission. Was planned for ischemic work-up as out-patient but re-presented to the hospital with CVA. Notably, the patient has had 4 CVAs in the past. No known history of Afib (no monitor in the system) and no LV thrombus noted on TTE, however, definity was not used.   Planned for TEE today given recurrent CVAs. Discussed with EP, will plan for 30day event monitor for Afib monitoring. Given reduced EF, may need ICD in the future and would like to avoid loop if ultimately needs a device. If EF improves with GDMT, will proceed with loop at that time (can be done as out-patient).  Exam: GEN: Chronically ill appearing, frail, alert and awake Neck: No JVD. Right ptosis Cardiac: RRR, no murmurs, rubs, or gallops. Respiratory: Clear to auscultation bilaterally. GI: Soft, nontender, non-distended MS: No edema; No deformity. Neuro:  Alert and oriented to person and place Psych: Normal affect     Plan: -TEE today to assess for cardiac source of embolism -Will proceed with 30 day event monitor for Afib over loop after discussion with EP as patient has reduced LVEF and may ultimately need a ICD -If EF recovers on GDMT, can proceed with loop recorder as out-patient if 30d monitor unrevealing - Continue home lasix 20mg  QOD as maintenance diuretic - Continue Coreg 3.125mg  BID, losartan 25mg  daily - Start spironolactone 12.5mg  daily - Will change losartan to entresto as able - Goal to add SGLT2i as able; may not need scheduled diuretic if added - Planned for outpatient ischemic evaluation when acute issue resolved with Lexiscan, appt has made on 04/22/21 - Continue ASA 325mg  daily and plavix 75mg   daily - Continue lipitor 20mg  daily  Gwyndolyn Kaufman, MD

## 2021-04-14 NOTE — Transfer of Care (Signed)
Immediate Anesthesia Transfer of Care Note  Patient: Brad Harrell  Procedure(s) Performed: TRANSESOPHAGEAL ECHOCARDIOGRAM (TEE) BUBBLE STUDY  Patient Location: Endoscopy Unit  Anesthesia Type:MAC  Level of Consciousness: drowsy  Airway & Oxygen Therapy: Patient Spontanous Breathing and Patient connected to face mask oxygen  Post-op Assessment: Report given to RN and Post -op Vital signs reviewed and stable  Post vital signs: Reviewed and stable  Last Vitals:  Vitals Value Taken Time  BP    Temp    Pulse    Resp    SpO2      Last Pain:  Vitals:   04/14/21 1216  TempSrc: Oral  PainSc: 0-No pain         Complications: No notable events documented.

## 2021-04-14 NOTE — Anesthesia Procedure Notes (Signed)
Procedure Name: MAC Date/Time: 04/14/2021 1:40 PM Performed by: Lieutenant Diego, CRNA Pre-anesthesia Checklist: Patient identified, Emergency Drugs available, Suction available, Patient being monitored and Timeout performed Patient Re-evaluated:Patient Re-evaluated prior to induction Oxygen Delivery Method: Simple face mask Preoxygenation: Pre-oxygenation with 100% oxygen Induction Type: IV induction

## 2021-04-14 NOTE — Interval H&P Note (Signed)
History and Physical Interval Note:  04/14/2021 1:27 PM  Brad Harrell  has presented today for surgery, with the diagnosis of Stroke.  The various methods of treatment have been discussed with the patient and family. After consideration of risks, benefits and other options for treatment, the patient has consented to  Procedure(s): TRANSESOPHAGEAL ECHOCARDIOGRAM (TEE) (N/A) as a surgical intervention.  The patient's history has been reviewed, patient examined, no change in status, stable for surgery.  I have reviewed the patient's chart and labs.  Questions were answered to the patient's satisfaction.     [name]

## 2021-04-14 NOTE — Anesthesia Preprocedure Evaluation (Addendum)
Anesthesia Evaluation  Patient identified by MRN, date of birth, ID band Patient awake    Reviewed: Allergy & Precautions, NPO status , Patient's Chart, lab work & pertinent test results, reviewed documented beta blocker date and time   Airway Mallampati: III  TM Distance: >3 FB Neck ROM: Full    Dental  (+) Edentulous Upper, Poor Dentition, Dental Advisory Given,    Pulmonary neg pulmonary ROS, Patient abstained from smoking., former smoker,    Pulmonary exam normal breath sounds clear to auscultation       Cardiovascular hypertension, Pt. on home beta blockers and Pt. on medications +CHF  Normal cardiovascular exam Rhythm:Regular Rate:Normal  TTE 2022 IMPRESSIONS  1. Left ventricular ejection fraction, by estimation, is 25 to 30%. The  left ventricle has severely decreased function. The left ventricle  demonstrates global hypokinesis. There is mild left ventricular  hypertrophy. Left ventricular diastolic parameters  are consistent with Grade I diastolic dysfunction (impaired relaxation).  2. No apical thrombus seen, though in setting of severe LV dysfunction  and presentation with CVA, would consider limited echo with contrast to  rule out LV thrombus  3. Right ventricular systolic function is normal. The right ventricular  size is normal. Tricuspid regurgitation signal is inadequate for assessing  PA pressure.  4. The mitral valve is normal in structure. Trivial mitral valve  regurgitation.  5. The aortic valve is tricuspid. Aortic valve regurgitation is not  visualized. No aortic stenosis is present.  6. The inferior vena cava is normal in size with greater than 50%  respiratory variability, suggesting right atrial pressure of 3 mmHg.  7. Agitated saline contrast bubble study was negative, with no evidence  of any interatrial shunt.   Neuro/Psych CVA (right sided weakness), Residual Symptoms negative psych ROS    GI/Hepatic negative GI ROS, (+)     substance abuse  cocaine use,   Endo/Other  diabetes, Type 2, Oral Hypoglycemic AgentsHypothyroidism   Renal/GU negative Renal ROS  negative genitourinary   Musculoskeletal negative musculoskeletal ROS (+)   Abdominal   Peds  Hematology  (+) Blood dyscrasia (Hgb 8.8, on plavix), anemia ,   Anesthesia Other Findings Presented to the ED on 04/08/21 for AMS. Noted global aphasia with right sided hemiparesis and a code stroke was called. 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, urinary retention, HLD, hypothyroidism, DM II, Cs/dHF, cardiomyopathy, history of cocaine and tobacco abuse, s/p TURP and orchiectomy due to orchitis and pyocele on 5/27, discharged from [facility] on 03/30/21 to SNF for rehab  Reproductive/Obstetrics                            Anesthesia Physical Anesthesia Plan  ASA: 4  Anesthesia Plan: MAC   Post-op Pain Management:    Induction: Intravenous  PONV Risk Score and Plan: Propofol infusion and Treatment may vary due to age or medical condition  Airway Management Planned: Natural Airway  Additional Equipment:   Intra-op Plan:   Post-operative Plan:   Informed Consent: I have reviewed the patients History and Physical, chart, labs and discussed the procedure including the risks, benefits and alternatives for the proposed anesthesia with the patient or authorized representative who has indicated his/her understanding and acceptance.     Dental advisory given  Plan Discussed with: CRNA  Anesthesia Plan Comments:  Anesthesia Quick Evaluation  

## 2021-04-14 NOTE — Plan of Care (Signed)
  Problem: Education: Goal: Knowledge of General Education information will improve Description: Including pain rating scale, medication(s)/side effects and non-pharmacologic comfort measures Outcome: Progressing   Problem: Health Behavior/Discharge Planning: Goal: Ability to manage health-related needs will improve Outcome: Progressing   Problem: Clinical Measurements: Goal: Ability to maintain clinical measurements within normal limits will improve Outcome: Progressing Goal: Will remain free from infection Outcome: Completed/Met Goal: Diagnostic test results will improve Outcome: Progressing Goal: Respiratory complications will improve Outcome: Progressing Goal: Cardiovascular complication will be avoided Outcome: Progressing   Problem: Activity: Goal: Risk for activity intolerance will decrease Outcome: Progressing   Problem: Nutrition: Goal: Adequate nutrition will be maintained Outcome: Progressing   Problem: Coping: Goal: Level of anxiety will decrease Outcome: Progressing   Problem: Elimination: Goal: Will not experience complications related to bowel motility Outcome: Not Progressing Goal: Will not experience complications related to urinary retention Outcome: Progressing   Problem: Elimination: Goal: Will not experience complications related to bowel motility Outcome: Not Progressing Senna given   Problem: Elimination: Goal: Will not experience complications related to urinary retention Outcome: Progressing   Problem: Pain Managment: Goal: General experience of comfort will improve Outcome: Progressing   Problem: Safety: Goal: Ability to remain free from injury will improve Outcome: Progressing   Problem: Skin Integrity: Goal: Risk for impaired skin integrity will decrease Outcome: Progressing

## 2021-04-14 NOTE — Interval H&P Note (Deleted)
History and Physical Interval Note:  04/14/2021 1:24 PM  Brad Harrell  has presented today for surgery, with the diagnosis of BACTEREMIA.  The various methods of treatment have been discussed with the patient and family. After consideration of risks, benefits and other options for treatment, the patient has consented to  Procedure(s): TRANSESOPHAGEAL ECHOCARDIOGRAM (TEE) (N/A) as a surgical intervention.  The patient's history has been reviewed, patient examined, no change in status, stable for surgery.  I have reviewed the patient's chart and labs.  Questions were answered to the patient's satisfaction.     [name]

## 2021-04-14 NOTE — Anesthesia Postprocedure Evaluation (Signed)
Anesthesia Post Note  Patient: Brad Harrell  Procedure(s) Performed: TRANSESOPHAGEAL ECHOCARDIOGRAM (TEE) BUBBLE STUDY     Patient location during evaluation: Endoscopy Anesthesia Type: MAC Level of consciousness: awake and alert, oriented and patient cooperative Pain management: pain level controlled Vital Signs Assessment: post-procedure vital signs reviewed and stable Respiratory status: spontaneous breathing, nonlabored ventilation and respiratory function stable Cardiovascular status: blood pressure returned to baseline and stable Postop Assessment: no apparent nausea or vomiting Anesthetic complications: no   No notable events documented.  Last Vitals:  Vitals:   04/14/21 1423 04/14/21 1435  BP: 104/69 137/66  Pulse:  82  Resp: 17 16  Temp:    SpO2:  100%    Last Pain:  Vitals:   04/14/21 1435  TempSrc:   PainSc: 0-No pain                 Emre Stock,E. Shaqueta Casady

## 2021-04-14 NOTE — Progress Notes (Signed)
SLP Cancellation Note  Patient Details Name: Brad Harrell MRN: [id number] DOB: 1946-08-29   Cancelled treatment:       Reason Eval/Treat Not Completed: Patient at procedure or test/unavailable   Hayden Rasmussen MA, CCC-SLP Acute Rehabilitation Services   04/14/2021, 1:48 PM

## 2021-04-14 NOTE — Consult Note (Addendum)
ELECTROPHYSIOLOGY CONSULT NOTE  Patient ID: Brad Harrell MRN: [id number], DOB/AGE: 77/19/1947   Admit date: 04/08/2021 Date of Consult: 04/14/2021  Primary Physician: Nolene Ebbs, MD Primary Cardiologist: Dr. Percival Spanish Reason for Consultation: Cryptogenic stroke ; recommendations regarding Implantable Loop Recorder requested by Dr. Erlinda Hong  History of Present Illness Brad Harrell was admitted on 04/08/2021 with global aphasia and new stroke.    PMHx includes: HTN, HLD, DM, prior smoker, prior strokes (x4), chronic CHF (systolic) lives in a NH.  He had a recent admission 03/21/21- 03/30/21 at [facility] for sepsis due to left epididymoorchitis with abscess s/p left scrotal exploration with left orchiectomy and drainage of pyocele and TURP During that stay cardiology was called for evaluation of CHF, as pre-op Echo on 03/24/21 showed EF  25-30%, LV severely decreased function, LV global hypokinesis, indeterminate diastolic filling, RV systolic function mildly reduced, trivial MR, mild dilatation of ascending aorta 45m. He was noted to have low EF since 2015 Echo , cardiomyopathy etiology was unclear, he does not see any cardiologist previously nor taking any GDMT. On exam he complained DOE, peripheral edema, intermittent palpitation. He was started on Lasix for volume overload, and discharged on coreg 3.1270mBID, losartan 2524maily, and lasix 34m62mD for medical therapy, Entresto and spironolactone were not added due to low BP.  He was also recommended outpatient ischemic evaluation when acute illness stabilized to further evaluate underlying etiology of cardiomyopathy.  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  The patient has been monitored on telemetry which has demonstrated sinus rhythm with no arrhythmias.    Inpatient stroke work-up is to be completed with a TEE.   04/11/21: TTE/bubble study IMPRESSIONS   1. Left ventricular ejection fraction, by estimation, is 25 to 30%. The  left ventricle has severely decreased function. The left ventricle  demonstrates global hypokinesis. There is mild left ventricular  hypertrophy. Left ventricular diastolic parameters   are consistent with Grade I diastolic dysfunction (impaired relaxation).   2. No apical thrombus seen, though in setting of severe LV dysfunction  and presentation with CVA, would consider limited echo with contrast to  rule out LV thrombus   3. Right ventricular systolic function is normal. The right ventricular  size is normal. Tricuspid regurgitation signal is inadequate for assessing  PA pressure.   4. The mitral valve is normal in structure. Trivial mitral valve  regurgitation.   5. The aortic valve is tricuspid. Aortic valve regurgitation is not  visualized. No aortic stenosis is present.   6. The inferior vena cava is normal in size with greater than 50%  respiratory variability, suggesting right atrial pressure of 3 mmHg.   7. Agitated saline contrast bubble study was negative, with no evidence  of any interatrial shunt.    03/24/21: TTE IMPRESSIONS   1. Left ventricular ejection fraction, by estimation, is 25 to 30%. Left  ventricular ejection fraction by 2D MOD biplane is 26.6 %. The left  ventricle has severely decreased function. The left ventricle demonstrates  global hypokinesis. Indeterminate  diastolic filling due to E-A fusion.   2. Right ventricular systolic function is mildly reduced. The right  ventricular  size is normal. Tricuspid regurgitation signal is inadequate  for assessing PA pressure.   3. The mitral valve is grossly normal. Trivial  mitral valve  regurgitation. No evidence of mitral stenosis.   4. The aortic valve is tricuspid. Aortic valve regurgitation is not  visualized. No aortic stenosis is present.   5. There is mild dilatation of the ascending aorta, measuring 42 mm.   6. The inferior vena cava is normal in size with greater than 50%  respiratory variability, suggesting right atrial pressure of 3 mmHg.    Lab work is reviewed. anemic   Prior to admission, the patient denies current chest pain, shortness of breath, dizziness, palpitations, or syncope.  He does say that sometimes he feels a little discomfort in his chest and often feels palpitations, sometimes these make him feel weak   They are recovering from their stroke with plans to SNF at discharge.    Past Medical History:  Diagnosis Date   Anemia    Iron Defficiency   Cardiomyopathy ([place])    Combined systolic and diastolic congestive heart failure (HCC)    Diabetes mellitus without complication ([place])    TYPE 2   Foley catheter in place    SINCE NOV 2017   H/O cocaine abuse ([place])    Hyperlipidemia    Hypertension    Hypothyroidism    Liver abscess    measuring 7 x 9 cm/notes 03/04/2013   Stroke ([place]) 2010   WEAK ON BOTH SIDES NOW TOTAL OF 3 STROKES    Urinary retention      Surgical History:  Past Surgical History:  Procedure Laterality Date   APPENDECTOMY     COLONOSCOPY N/A 03/11/2013   Procedure: COLONOSCOPY;  Surgeon: Beryle Beams, MD;  Location: [place];  Service: Endoscopy;  Laterality: N/A;   COLONOSCOPY WITH PROPOFOL N/A 03/15/2018   Procedure: COLONOSCOPY WITH PROPOFOL;  Surgeon: Carol Ada, MD;  Location: [facility];  Service: Endoscopy;  Laterality: N/A;   CYSTOSCOPY WITH INSERTION OF UROLIFT N/A 01/04/2017   Procedure: CYSTOSCOPY WITH INSERTION OF UROLIFT x8;  Surgeon: Cleon Gustin, MD;  Location: [facility];  Service: Urology;  Laterality: N/A;   ESOPHAGOGASTRODUODENOSCOPY N/A 03/11/2013   Procedure:  ESOPHAGOGASTRODUODENOSCOPY (EGD);  Surgeon: Beryle Beams, MD;  Location: [facility] ENDOSCOPY;  Service: Endoscopy;  Laterality: N/A;   GIVENS CAPSULE STUDY N/A 03/12/2013   Procedure: GIVENS CAPSULE STUDY;  Surgeon: Beryle Beams, MD;  Location: [place];  Service: Endoscopy;  Laterality: N/A;   INGUINAL HERNIA REPAIR Right    IR CT HEAD LTD  04/08/2021   IR PERCUTANEOUS ART THROMBECTOMY/INFUSION INTRACRANIAL INC DIAG ANGIO  04/08/2021   POLYPECTOMY  03/15/2018   Procedure: POLYPECTOMY;  Surgeon: Carol Ada, MD;  Location: [facility];  Service: Endoscopy;;   RADIOLOGY WITH ANESTHESIA N/A 04/08/2021   Procedure: IR WITH ANESTHESIA;  Surgeon: Radiologist, Medication, MD;  Location: [place];  Service: Radiology;  Laterality: N/A;   SCROTAL EXPLORATION Left 03/25/2021   Procedure: LEFT SCROTUM EXPLORATION WITH ORCHIECTOMY;  Surgeon: [name], MD;  Location: [facility];  Service: Urology;  Laterality: Left;   TIBIA FRACTURE SURGERY Left    TRANSURETHRAL RESECTION OF PROSTATE N/A 03/25/2021   Procedure: CYSTOSCOPY TRANSURETHRAL RESECTION OF THE PROSTATE (TURP);  Surgeon: [name], MD;  Location: [facility];  Service: Urology;  Laterality: N/A;     Medications Prior to Admission  Medication Sig Dispense Refill Last Dose   acetaminophen (TYLENOL) 325 MG tablet Take 2 tablets (650  mg total) by mouth every 6 (six) hours as needed for mild pain (or Fever >/= 101). (Patient taking differently: Take 650 mg by mouth every 4 (four) hours as needed (elevated temp).)   unknown   aspirin EC 81 MG tablet Take 81 mg by mouth daily at 12 noon.   04/07/2021 at noon   atorvastatin (LIPITOR) 20 MG tablet TAKE ONE TABLET BY MOUTH DAILY (Patient taking differently: Take 20 mg by mouth every evening.) 30 tablet 0 04/07/2021 at pm   carvedilol (COREG) 3.125 MG tablet Take 3.125 mg by mouth 2 (two) times daily with a meal.   04/07/2021 at 1700   clopidogrel (PLAVIX) 75 MG tablet Take 75 mg by mouth daily at 12 noon.   04/07/2021 at  noon   finasteride (PROSCAR) 5 MG tablet Take 5 mg by mouth daily at 12 noon.   04/07/2021 at noon   furosemide (LASIX) 20 MG tablet Take 1 tablet (20 mg total) by mouth every other day. 15 tablet 0 04/06/2021 at noon   levothyroxine (SYNTHROID, LEVOTHROID) 100 MCG tablet Take 100 mcg by mouth daily before breakfast.    04/07/2021 at am   losartan (COZAAR) 25 MG tablet Take 1 tablet (25 mg total) by mouth daily. (Patient taking differently: Take 25 mg by mouth daily at 12 noon.) 30 tablet 0 04/07/2021 at noon   metFORMIN (GLUCOPHAGE) 500 MG tablet Take 500 mg by mouth daily with breakfast.    04/07/2021 at am   Multiple Vitamin (MULTIVITAMIN WITH MINERALS) TABS tablet Take 1 tablet by mouth daily. 30 tablet 0 04/07/2021 at am   oxyCODONE (OXY IR/ROXICODONE) 5 MG immediate release tablet Take 1 tablet (5 mg total) by mouth every 6 (six) hours as needed for severe pain. 10 tablet 0 unknown   polyethylene glycol (MIRALAX / GLYCOLAX) 17 g packet Take 17 g by mouth daily as needed for moderate constipation. 14 each 0 unknown   feeding supplement (ENSURE ENLIVE / ENSURE PLUS) LIQD Take 237 mLs by mouth 2 (two) times daily between meals. (Patient not taking: No sig reported) 14220 mL 0 Not Taking   glucose blood (FREESTYLE LITE) test strip Use as instructed (Patient taking differently: 1 each by Other route daily. Use as instructed) 100 each 12    glucose monitoring kit (FREESTYLE) monitoring kit 1 each by Does not apply route as needed for other. For blood sugar check (Patient taking differently: 1 each by Does not apply route daily. For blood sugar check) 1 each 0    Lancets 28G MISC To check blood sugar three times daily with meals and at bedtime (Patient taking differently: 1 strip by Other route daily.) 120 each 3     Inpatient Medications:   aspirin EC  325 mg Oral Daily   atorvastatin  20 mg Oral Daily   carvedilol  3.125 mg Oral BID WC   Chlorhexidine Gluconate Cloth  6 each Topical Daily   clopidogrel  75  mg Oral Daily   enoxaparin (LOVENOX) injection  40 mg Subcutaneous Q24H   finasteride  5 mg Oral Daily   furosemide  20 mg Oral QODAY   insulin aspart  0-9 Units Subcutaneous Q4H   levothyroxine  100 mcg Oral QAC breakfast   losartan  25 mg Oral Daily   mouth rinse  15 mL Mouth Rinse BID   pantoprazole  40 mg Oral QHS   sodium chloride flush  3 mL Intravenous Q12H    Allergies:  Allergies  Allergen  Reactions   Rocephin [Ceftriaxone] Nausea And Vomiting    Social History   Socioeconomic History   Marital status: Widowed    Spouse name: Not on file   Number of children: Not on file   Years of education: Not on file   Highest education level: Not on file  Occupational History   Not on file  Tobacco Use   Smoking status: Former    Packs/day: 1.00    Years: 51.00    Pack years: 51.00    Types: Cigarettes   Smokeless tobacco: Never   Tobacco comments:    QUIT NOV 2010  Vaping Use   Vaping Use: Never used  Substance and Sexual Activity   Alcohol use: No    Comment: NOV 2010   Drug use: Yes    Types: Cocaine    Comment: NOV 2010 LAST USED   Sexual activity: Not Currently  Other Topics Concern   Not on file  Social History Narrative   Not on file   Social Determinants of Health   Financial Resource Strain: Not on file  Food Insecurity: Not on file  Transportation Needs: Not on file  Physical Activity: Not on file  Stress: Not on file  Social Connections: Not on file  Intimate Partner Violence: Not on file     Family History  Problem Relation Age of Onset   Hypertension Mother    Hypertension Father    Diabetes type II Father    Hypertension Brother       Review of Systems: All other systems reviewed and are otherwise negative except as noted above.  Physical Exam: Vitals:   04/13/21 2047 04/13/21 2321 04/14/21 0418 04/14/21 0737  BP: (!) 108/52 131/63 119/66 133/62  Pulse: 65 87 64 65  Resp: _0 Temp: 98.1 F (36.7 C) 98.5 F (36.9 C)  98.6 F (37 C) 97.8 F (36.6 C)  TempSrc: Oral Oral Oral Oral  SpO2: 100% 100% 100% 99%  Weight:        GEN- The patient is well appearing, alert and oriented x 3 today.   Head- normocephalic, atraumatic Eyes-  Sclera clear, conjunctiva pink Ears- hearing intact Oropharynx- clear Neck- supple Lungs- CTA b/l, normal work of breathing Heart- RRR, no murmurs, rubs or gallops  GI- soft, NT, ND Extremities- no clubbing, cyanosis, or edema MS- no significant deformity or atrophy Skin- no rash or lesion Psych- euthymic mood, full affect   Labs:   Lab Results  Component Value Date   WBC 4.5 04/14/2021   HGB 8.8 (L) 04/14/2021   HCT 28.6 (L) 04/14/2021   MCV 85.1 04/14/2021   PLT 207 04/14/2021    Recent Labs  Lab 04/08/21 1907 04/09/21 0500 04/14/21 0133  NA 134*   < > 136  K 4.9   < > 4.3  CL 106   < > 106  CO2 21*   < > 24  BUN 17   < > 20  CREATININE 0.94   < > 1.11  CALCIUM 8.8*   < > 8.9  PROT 6.6  --   --   BILITOT 0.6  --   --   ALKPHOS 53  --   --   ALT 10  --   --   AST 18  --   --   GLUCOSE 218*   < > 115*   < > = values in this interval not displayed.   Lab Results  Component Value Date  CKTOTAL 166 05/01/2011   CKMB 3.3 05/01/2011   TROPONINI <0.30 06/30/2014   Lab Results  Component Value Date   CHOL 108 04/09/2021   CHOL 104 03/29/2021   CHOL 115 06/30/2014   Lab Results  Component Value Date   HDL 18 (L) 04/09/2021   HDL 24 (L) 03/29/2021   HDL 30 (L) 06/30/2014   Lab Results  Component Value Date   LDLCALC 53 04/09/2021   LDLCALC 63 03/29/2021   LDLCALC 59 06/30/2014   Lab Results  Component Value Date   TRIG 183 (H) 04/09/2021   TRIG 86 03/29/2021   TRIG 130 06/30/2014   Lab Results  Component Value Date   CHOLHDL 6.0 04/09/2021   CHOLHDL 4.3 03/29/2021   CHOLHDL 3.8 06/30/2014   No results found for: LDLDIRECT  No results found for: DDIMER   Radiology/Studies:   MR BRAIN WO CONTRAST Result Date:  04/10/2021 CLINICAL DATA:  Right-sided weakness and global aphasia. MCA occlusion with revascularization EXAM: MRI HEAD WITHOUT CONTRAST TECHNIQUE: Multiplanar, multiecho pulse sequences of the brain and surrounding structures were obtained without intravenous contrast. COMPARISON:  CT and CTA from 2 days ago FINDINGS: Brain: Small acute cortical infarcts most confluent at the posterior left insula and parietal lobe with mild patchy involvement of the left frontal cortex towards the vertex. Single punctate infarct in the upper right frontal parietal cortex. Confluent chronic small vessel ischemia in the cerebral white matter. Chronic left inferior cerebellar infarct. Chronic deep gray nucleus and deep white matter lacunes with wallerian degeneration crossing the corpus callosum. No acute hemorrhage, hydrocephalus, or mass. Brain atrophy. Chronic blood products at the inferior left cerebellum and left frontal white matter. Vascular: Preserved flow voids. 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 Patchy acute cortical infarction in the left MCA distribution. 2. Punctate right frontal parietal cortex infarct. 3. Advanced chronic small vessel disease. Electronically Signed   By: Monte Fantasia M.D.   On: 04/10/2021 10:06   MR BRAIN WO CONTRAST Result Date: 04/08/2021 Luanne Bras, MD     04/08/2021  1:21 PM S/P Lt common carotid arteriogram RT CFA approach S/P complrye revascularization of occluded Inf division Lt MCA m2 branch with x1 pass with 53mx 40 mm solitaire retriever and contact aspiration achieving a TICi 3 revascularization. Post CT no ICH. 15F angioseal for hemostasis Rt groin puncture site. Distal pulses both DPs dopplerable. Extubated Moving Lt side spontaneously. Able to lift RT UE. [name] MD   IR CT Head Ltd Result Date: 04/12/2021 INDICATION: New onset of global aphasia, and right-sided weakness. Occluded M2 branch of the  inferior division of the left middle cerebral artery on CT angiogram of the head and neck. EXAM: 1. EMERGENT LARGE VESSEL OCCLUSION THROMBOLYSIS (anterior CIRCULATION) COMPARISON:  CT angiogram of the head and neck of 04/08/2021. MEDICATIONS: Ancef 2 g IV antibiotic was administered within 1 hour of the procedure. ANESTHESIA/SEDATION: General anesthesia. CONTRAST:  Isovue 300 approximately 75 mL. FLUOROSCOPY TIME:  Fluoroscopy Time: 27 minutes 54 seconds (1126 mGy). COMPLICATIONS: None immediate. TECHNIQUE: Following a full explanation of the procedure along with the potential associated complications, an informed witnessed consent was obtained. The risks of intracranial hemorrhage of 10%, worsening neurological deficit, ventilator dependency, death and inability to revascularize were all reviewed in detail with the patient's daughter. The patient was then put under general [facility] The right groin was prepped and draped in the usual  sterile fashion. Thereafter using modified Seldinger technique, transfemoral access into the right common femoral artery was obtained without difficulty. Over a 0.035 inch guidewire an 8 [place] Pinnacle 25 cm sheath was inserted. Through this, and also over a 0.035 inch guidewire a 5.5 [place] Berenstein support catheter inside of an 087 balloon guide catheter which been prepped with 50% contrast and 50% heparinized saline infusion was advanced to the distal left internal carotid artery. Balloon guide catheter was then advanced to the distal left ICA. The exchange guidewire, and the support 5.5 [place] catheter were retrieved and removed. Good aspiration obtained from the hub of the balloon guide catheter in the left internal carotid artery. Gentle control arteriogram performed through this demonstrated no evidence of spasms, dissections or of intraluminal filling defects. An intracranial DSA confirmed the presence of the  occlusion of an M2 branch of the inferior division of the left middle cerebral artery. The remainder of the MCA branches remain widely patent. The left anterior cerebral artery is hypoplastic with flow seen distally into the left anterior cerebral A2 segment. Over a 0.014 inch standard Synchro micro guidewire, an 021 160 cm Trevo ProVue microcatheter inside of an 071 136 cm Zoom aspiration catheter was advanced to the end of balloon guide catheter. With the micro guidewire leading with a J-tip configuration, and a torque device, access was obtained through the occluded inferior division M2 branch. The microcatheter was advanced over the micro guidewire to the M2 M3 junction. Guidewire was removed. Good aspiration obtained from hub of the microcatheter. A 3 mm x 40 mm Solitaire X retrieval device was then advanced to the distal end of the microcatheter. This was then the deployed by retrieving the microcatheter into the left internal carotid artery distally. The Zoom aspiration catheter was advanced now to the distal left middle cerebral artery. With proximal flow arrest in the left internal carotid artery, and constant aspiration being applied with the 20 mL syringe at the hub of the balloon guide catheter, and with the Penumbra aspiration device at the hub of the 071 Zoom aspiration catheter for approximately 2-1/2 minutes, the combination of the retrieval device, the microcatheter and the Zoom aspiration catheter were retrieved and removed. Flow arrest was reversed. A control arteriogram performed through the balloon guide catheter in the left internal carotid artery demonstrated complete revascularization of the previously occluded branch of the inferior division M2 segment. A TICI 3 revascularization of the left MCA territory had been achieved. The left anterior cerebral artery remained patent. A final control arteriogram performed through the balloon guide catheter in the left common carotid artery continued to  demonstrate excellent flow through the internal carotid artery extra cranially and intracranially. The balloon guide was removed. An 8 French Angio-Seal closure device was applied for hemostasis at the right groin puncture site. An immediate CT of the brain demonstrated no evidence of hemorrhage or mass effect. The patient was left intubated for airway protection. He was then transferred to the neuro ICU for post thrombectomy care. FINDINGS: As above. PROCEDURE: As above. IMPRESSION: Status post endovascular complete revascularization of occluded M2 branch of the inferior division of the left middle cerebral artery with 1 pass with a 3 mm x 40 mm Solitaire X retrieval device, and contact aspiration achieving a TICI 3 revascularization. PLAN: Follow-up as per referring MD. Electronically Signed   By: Luanne Bras M.D.   On: 04/11/2021 11:30    CT HEAD CODE STROKE WO CONTRAST Result Date: 04/08/2021 CLINICAL DATA:  Acute  headache.  Right-sided weakness and aphasia. EXAM: CT HEAD WITHOUT CONTRAST CT ANGIOGRAPHY HEAD AND NECK CT PERFUSION BRAIN TECHNIQUE: Contiguous axial images were obtained from the base of the skull through the vertex without intravenous contrast. Multidetector CT imaging of the head and neck was performed using the standard protocol during bolus administration of intravenous contrast. Multiplanar CT image reconstructions and MIPs were obtained to evaluate the vascular anatomy. Carotid stenosis measurements (when applicable) are obtained utilizing NASCET criteria, using the distal internal carotid diameter as the denominator. Multiphase CT imaging of the brain was performed following IV bolus contrast injection. Subsequent parametric perfusion maps were calculated using RAPID software. CONTRAST:  140m OMNIPAQUE IOHEXOL 350 MG/ML SOLN COMPARISON:: COMPARISON: Same day head CT.  MRA 07/01/2014. FINDINGS: CT HEAD FINDINGS Brain: No evidence of acute large vascular territory infarction,  hemorrhage, hydrocephalus, extra-axial collection or mass lesion/mass effect. Moderate to advanced confluent and patchy white matter hypoattenuation, compatible with chronic microvascular ischemic disease. Remote inferior left cerebellar infarct. ASPECTS 10. Vascular: See below. Skull: No acute fracture. Sinuses: Mild-to-moderate scattered ethmoid air cell mucosal thickening. Small left maxillary sinus, partially imaged. 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 Review of the MIP images confirms the above findings CTA NECK FINDINGS Aortic arch: Great vessel origins are patent. Right carotid system: Calcific and noncalcific atherosclerosis at the carotid bifurcation without greater than 50% narrowing. Left carotid system: Calcific and noncalcific atherosclerosis at the carotid bifurcation without greater than 50% narrowing. Vertebral arteries: Streak artifact limits evaluation lower neck; however, suspected occlusion of bilateral vertebral artery origins with reconstitution in the mid neck, likely from collaterals. Irregular opacification of more distal right vertebral artery with multifocal narrowing. Focal severe stenosis of the left vertebral artery at C4-C5. Skeleton: No acute abnormality. Severe multilevel degenerative change in lower cervical spine. Other neck: Secretions in the trachea, placing the patient at risk for aspiration. Upper chest: Emphysema. Review of the MIP images confirms the above findings CTA HEAD FINDINGS Anterior circulation: Extensive calcific atherosclerosis of bilateral cavernous and paraclinoid ICAs with severe right and moderate left paraclinoid ICA narrowing. Patent M1 MCAs. Focal short-segment opacification of a left distal M2 MCA branch with occlusion. This vessel is non-opacified more proximally in the sylvian fissure, compatible with a more proximal M2 occlusion or high-grade stenosis. The exact location of M2  occlusion/stenosis is not confidently identified. Patent ACAs. No visible aneurysm. Posterior circulation: Small vertebrobasilar system. Moderate right intradural vertebral artery stenosis due to calcific atherosclerosis. Small bilateral posterior cerebral arteries. Small bilateral posterior communicating arteries. Moderate right P2 PCA stenosis. Otherwise mild multifocal bilateral PCA narrowing. CT Brain Perfusion Findings: ASPECTS: 10 CBF (<30%) Volume: 785mPerfusion (Tmax>6.0s) volume: 3866mismatch Volume: 28m42mFARCT LOCATION: Left posterior MCA territory IMPRESSION: CT head: 1. No evidence of acute large vascular territory infarct or hemorrhage by CT. ASPECTS 10. 2. Moderate to advanced chronic microvascular ischemic disease. 3. Remote inferior left cerebellar infarct. 4. 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 CT perfusion: 1. Rapid calculates a 31 mL area of mismatch/penumbra in the posterior left MCA territory with 7 mL core infarct. CTA head: 1. Focal short-segment opacification of a left distal M2 MCA branch with occlusion. This vessel is non-opacified more proximally in the sylvian fissure, compatible with a more proximal M2 occlusion or high-grade stenosis. The exact location of M2 occlusion/stenosis is not confidently identified. Catheter arteriogram could further characterize if clinically indicated. 2. Severe right and moderate  left paraclinoid ICA narrowing due to calcific atherosclerosis. 3. 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 4. Superimposed moderate right intradural and right P2 PCA stenosis. CTA neck: 1. Streak artifact limits evaluation proximally with suspected age indeterminate occlusion of bilateral vertebral artery origins with reconstitution in the mid neck. Multifocal narrowing of the remainder of the right vertebral artery and focal  narrowing of the left vertebral artery at C4-C5. 2. Secretions in the trachea, placing the patient at risk for aspiration. 3. Emphysema. Critical findings discussed with Dr. Quinn Axe via telephone at 10:40 a.m. Electronically Signed   By: Margaretha Sheffield MD   On: 04/08/2021 11:29    12-lead ECG ST/SR All prior EKG's in EP[facility] reviewed with no documented atrial fibrillation  Telemetry SR  Assessment and Plan:  1. Cryptogenic stroke The patient presents with cryptogenic stroke.  The patient has a  TEE planned for this today.    The patient has a long standing CM of unknown source and no prior cardiac follow up, recently established with [facility] last month with pending out patient low up and only last month started on GDMT. He has nearly daily symptoms of palpitations.  I recommend cardiology plan for a 30 day [employer] event monitor and continue his w/u and follow up as planned. If no improvement in EF or not felt an ICD candidate going forward can plan loop out if no AF noted by monitor I have discussed with Dr. Johney Frame and Dr. Erlinda Hong, they are in agreement.  Dr. Rayann Heman will see later today Please call with questions.   Baldwin Jamaica, PA-C 04/14/2021    I have seen, examined the patient, and reviewed the above assessment and plan.  Changes to above are made where necessary.  On exam, RRR. Given frequent palpitations and structural heart disease, I would advise 30 day monitor rather than ILR.  He will follow-up with general cardiology.  Electrophysiology team to see as needed while here. Please call with questions.   Co Sign: Thompson Grayer, MD

## 2021-04-14 NOTE — Plan of Care (Signed)
  Problem: Ischemic Stroke/TIA Tissue Perfusion: Goal: Complications of ischemic stroke/TIA will be minimized 04/14/2021 2231 by Kaylyn Lim, RN Outcome: Progressing 04/14/2021 2046 by Kaylyn Lim, RN Outcome: Progressing

## 2021-04-14 NOTE — CV Procedure (Signed)
INDICATIONS: Stroke  PROCEDURE:   Informed consent was obtained prior to the procedure. The risks, benefits and alternatives for the procedure were discussed and the patient comprehended these risks.  Risks include, but are not limited to, cough, sore throat, vomiting, nausea, somnolence, esophageal and stomach trauma or perforation, bleeding, low blood pressure, aspiration, pneumonia, infection, trauma to the teeth and death.    After a procedural time-out, the oropharynx was anesthetized with 20% benzocaine spray.   During this procedure the patient was administered propofol per anesthesia.  The patient's heart rate, blood pressure, and oxygen saturation were monitored continuously during the procedure. The period of conscious sedation was 45 minutes, of which I was present face-to-face 100% of this time.  The transesophageal probe was inserted in the esophagus and stomach without difficulty and multiple views were obtained.  The patient was kept under observation until the patient left the procedure room.  The patient left the procedure room in stable condition.   Agitated microbubble saline contrast was administered.  COMPLICATIONS:    There were no immediate complications.  FINDINGS:   FORMAL ECHOCARDIOGRAM REPORT PENDING LVEF 35% No LA appendage thrombus. No LV apical thrombus.  No significant valvular heart disease. Mild aortic atherosclerosis.  RECOMMENDATIONS:    No embolic source on TEE.   Time Spent Directly with the Patient:  60 minutes   [name] 04/14/2021, 2:18 PM

## 2021-04-14 NOTE — Progress Notes (Addendum)
STROKE TEAM PROGRESS NOTE   INTERVAL HISTORY No acute events overnight, plan for TEE today. Discussed case with EP this morning.   Placement of loop will depend on TEE results.   Patients neurological exam is stable.    PERTINENT IMAGING   Echocardiogram 03/24/2021:  Left Ventricle: ejection fraction, by estimation, is 25 to 30%. Left ventricular ejection fraction by 2D MOD biplane is 26.6 %.The left ventricle has severely decreased function. The left ventricle demonstrates global hypokinesis. The left ventricular internal cavity size was normal in size. There is no left ventricular hypertrophy. Indeterminate diastolic filling due to E-A fusion.  Right Ventricle: The right ventricular size is normal. No increase in  right ventricular wall thickness. Right ventricular systolic function is  mildly reduced. Tricuspid regurgitation signal is inadequate for assessing PA pressure.  IAS/Shunts: The atrial septum is grossly normal.   04/08/21 CTA Head and Neck  CTA head:  1. Focal short-segment opacification of a left distal M2 MCA branch with occlusion. This vessel is non-opacified more proximally in the sylvian fissure, compatible with a more proximal M2 occlusion or high-grade stenosis. The exact location of M2 occlusion/stenosis is not confidently identified. Catheter arteriogram could further characterize if clinically indicated. 2. Severe right and moderate left paraclinoid ICA narrowing due to calcific atherosclerosis. 3. 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 4. Superimposed moderate right intradural and right P2 PCA stenosis.   CTA neck:  1. Streak artifact limits evaluation proximally with suspected age indeterminate occlusion of bilateral vertebral artery origins with reconstitution in the mid neck. Multifocal narrowing of the remainder of the right vertebral artery  04/10/21 MRI Brain  Patchy  acute cortical infarction in the left MCA distribution. Punctate right frontal parietal cortex infarct. Advanced chronic small vessel disease.  04/11/21 Echo Complete Bubble Study WO Image Enhancing Agent   1. Left ventricular ejection fraction, by estimation, is 25 to 30%. The left ventricle has severely decreased function. The left ventricle demonstrates global hypokinesis. There is mild left ventricular hypertrophy. Left ventricular diastolic parameters are consistent with Grade I diastolic dysfunction (impaired relaxation).   2. No apical thrombus seen, though in setting of severe LV dysfunction and presentation with CVA, would consider limited echo with contrast to rule out LV thrombus   3. Right ventricular systolic function is normal. The right ventricular size is normal. Tricuspid regurgitation signal is inadequate for assessing PA pressure.   4. The mitral valve is normal in structure. Trivial mitral valve regurgitation.   5. The aortic valve is tricuspid. Aortic valve regurgitation is not visualized. No aortic stenosis is present.   6. The inferior vena cava is normal in size with greater than 50% respiratory variability, suggesting right atrial pressure of 3 mmHg.   7. Agitated saline contrast bubble study was negative, with no evidence of any interatrial shunt.   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 male residing in nursing home Hx HLD, HTN, tobacco abuse, and DM II, prior strokes who presents as a stroke code with global aphasia. Did not receive IVTPA due to unknown LKW. He was found to have a distal L M2 occlusion thus underwent  thrombectomy with TICI 3 reperfusion.   #Left MCA  Stroke Initial CTH w/NAICP. CTA Head and Neck was pertinent for distal L M2 occlusion versus severe stenosis + severe right and moderate left paraclinoid ICA narrowing due to calcific atherosclerosis. MRI Brain revealed patchy acute cortical infarction in the left MCA distribution. Punctate right frontal parietal cortex infarct. Echocardiogram was notable for reduced EF at 25-30 %, global hypokinesis LA normal in size. Of note, EF was stable compared to Echo done in May. Stroke labs: LDL 53, HgbA1c 5.3. For secondary stroke prevention, given his significant ICAD it was decided to treat him with DAPT ( ASA 325 mg and Plavix 75 mg) for 90 days. His stroke etiology is cryptogenic; atheroembolism versus cardioembolic w/reduced EF  - Continue DAPT for 90 days with ASA 325 mg + Plavix 75 mg then ASA monotherapy after  -  Have consulted EP for possible loop monitor to evaluate for atrial fibrillation ( they will determine if it will be placed based on TEE findings)   #Hyperlipidemia His LDL is 53 which is at goal < 70 from a stroke reduction stand point. Are continuing home Atorvastatin 20 mg QD.   #Hypertension  #Chronic systolic HF He was diagnosed with HF in May of this year when he presented to the ed with scrotal pain. EF from May admission is stable when compared to EF this admission- 25 to 30 %. Cardiology was consulted for HF regimen optimization + opinion on AC in the setting of reduced EF at 25-30 %. They recommend the following:  - Lasix 20 mg QD, Coreg 3.125 BID, Losartan 25 mg QD  - Will need outpatient ischemic evaluation when acute issue resolved with Lexiscan, appt has made on 04/22/21 - Prophylactic AC is not needed in the absence of LV/LAA thrombus or documented Afib  - TEE 6/16 to evaluate for LV/LAA thrombus- if negative Loop to evaluate for atrial fibrillation   Stroke Dysphagia Screening  Evaluated by SLP on 6/11 and they recommended regular diet with thin liquids. No post stroke  dysphagia.  - NPO for TEE today   #Diabetes type II Controlled HgbA1c 5.3, goal < 7.0 - Treat with SSI while inpatient  #Hypothyroidism - Continue home synthroid 1061mcg daily   #Urinary retention 2/2 BPH - Continue home finasteride  #Heme/ID  No active infectious issues. Trending hemoglobin given it dropped to 8.1 but appears to have stabilized, currently 8.8 - Trend hemoglobin  - FFWU if he fevers   #Dispo  PT/OR are recommending SNF. He can return to his prior facility when medically ready.   Hospital day # [place], AGPCNP-BC  Stroke Nurse Practitioner   ATTENDING NOTE: I reviewed above note and agree with the assessment and plan. Pt was seen and examined.   No acute event overnight, neuro stable.  TEE done today showed EF 35% and no LV or LA thrombus. EP on board, recommend 30 day cardiac event monitoring as first step. May consider loop recorder if no afib found on monitoring and not candidate for ICD. Now on coreg and losartan and added spironolactone. Continue DAPT and statin. PT/OT recommend SNF.   For detailed assessment and plan, please refer to above as I have made changes wherever appropriate.   Rosalin Hawking, MD PhD Stroke Neurology 04/14/2021 7:37 AM    To contact Stroke Continuity provider, please refer to [email or web address] After hours, contact General Neurology

## 2021-04-14 NOTE — Plan of Care (Signed)
  Problem: Clinical Measurements: Goal: Cardiovascular complication will be avoided Outcome: Progressing   Problem: Nutrition: Goal: Adequate nutrition will be maintained Outcome: Progressing   Problem: Elimination: Goal: Will not experience complications related to bowel motility Outcome: Progressing   Problem: Pain Managment: Goal: General experience of comfort will improve Outcome: Progressing   Problem: Safety: Goal: Ability to remain free from injury will improve Outcome: Progressing   Problem: Skin Integrity: Goal: Risk for impaired skin integrity will decrease Outcome: Progressing   

## 2021-04-15 ENCOUNTER — Other Ambulatory Visit: Payer: Self-pay | Admitting: Physician Assistant

## 2021-04-15 DIAGNOSIS — I63512 Cerebral infarction due to unspecified occlusion or stenosis of left middle cerebral artery: Secondary | ICD-10-CM

## 2021-04-15 LAB — SARS CORONAVIRUS 2 (TAT 6-24 HRS): SARS Coronavirus 2: NEGATIVE

## 2021-04-15 LAB — CBC
HCT: 29.2 % — ABNORMAL LOW (ref 39.0–52.0)
Hemoglobin: 9 g/dL — ABNORMAL LOW (ref 13.0–17.0)
MCH: 25.7 pg — ABNORMAL LOW (ref 26.0–34.0)
MCHC: 30.8 g/dL (ref 30.0–36.0)
MCV: 83.4 fL (ref 80.0–100.0)
Platelets: 212 10*3/uL (ref 150–400)
RBC: 3.5 MIL/uL — ABNORMAL LOW (ref 4.22–5.81)
RDW: 19.1 % — ABNORMAL HIGH (ref 11.5–15.5)
WBC: 5.6 10*3/uL (ref 4.0–10.5)
nRBC: 0 % (ref 0.0–0.2)

## 2021-04-15 LAB — GLUCOSE, CAPILLARY
Glucose-Capillary: 103 mg/dL — ABNORMAL HIGH (ref 70–99)
Glucose-Capillary: 114 mg/dL — ABNORMAL HIGH (ref 70–99)
Glucose-Capillary: 114 mg/dL — ABNORMAL HIGH (ref 70–99)
Glucose-Capillary: 131 mg/dL — ABNORMAL HIGH (ref 70–99)
Glucose-Capillary: 133 mg/dL — ABNORMAL HIGH (ref 70–99)

## 2021-04-15 LAB — BASIC METABOLIC PANEL
Anion gap: 7 (ref 5–15)
BUN: 21 mg/dL (ref 8–23)
CO2: 22 mmol/L (ref 22–32)
Calcium: 8.9 mg/dL (ref 8.9–10.3)
Chloride: 107 mmol/L (ref 98–111)
Creatinine, Ser: 1 mg/dL (ref 0.61–1.24)
GFR, Estimated: >60 mL/min (ref >60)
Glucose, Bld: 122 mg/dL — ABNORMAL HIGH (ref 70–99)
Potassium: 4.6 mmol/L (ref 3.5–5.1)
Sodium: 136 mmol/L (ref 135–145)

## 2021-04-15 MED ORDER — DAPAGLIFLOZIN PROPANEDIOL 10 MG PO TABS
10.0000 mg | ORAL_TABLET | Freq: Every day | ORAL | Status: DC
  Administered 2021-04-15: 10 mg via ORAL
  Filled 2021-04-15: qty 1

## 2021-04-15 MED ORDER — CANAGLIFLOZIN 100 MG PO TABS: 100.0000 mg | ORAL_TABLET | Freq: Every day | ORAL | 3 refills | Status: DC

## 2021-04-15 MED ORDER — ASPIRIN 325 MG PO TBEC: 325.0000 mg | DELAYED_RELEASE_TABLET | Freq: Every day | ORAL | 0 refills | Status: DC

## 2021-04-15 MED ORDER — SPIRONOLACTONE 25 MG PO TABS: 12.5000 mg | ORAL_TABLET | Freq: Every day | ORAL | 3 refills | Status: AC

## 2021-04-15 MED ORDER — FUROSEMIDE 20 MG PO TABS
20.0000 mg | ORAL_TABLET | ORAL | Status: DC
  Administered 2021-04-15: 20 mg via ORAL
  Filled 2021-04-15: qty 1

## 2021-04-15 NOTE — Plan of Care (Signed)
  Problem: Education: Goal: Knowledge of General Education information will improve Description: Including pain rating scale, medication(s)/side effects and non-pharmacologic comfort measures Outcome: Adequate for Discharge   Problem: Health Behavior/Discharge Planning: Goal: Ability to manage health-related needs will improve Outcome: Adequate for Discharge   Problem: Clinical Measurements: Goal: Ability to maintain clinical measurements within normal limits will improve Outcome: Adequate for Discharge Goal: Diagnostic test results will improve Outcome: Adequate for Discharge Goal: Respiratory complications will improve Outcome: Adequate for Discharge Goal: Cardiovascular complication will be avoided Outcome: Adequate for Discharge   Problem: Activity: Goal: Risk for activity intolerance will decrease Outcome: Adequate for Discharge   Problem: Nutrition: Goal: Adequate nutrition will be maintained Outcome: Adequate for Discharge   Problem: Coping: Goal: Level of anxiety will decrease Outcome: Adequate for Discharge   Problem: Elimination: Goal: Will not experience complications related to bowel motility Outcome: Adequate for Discharge Goal: Will not experience complications related to urinary retention Outcome: Adequate for Discharge   Problem: Pain Managment: Goal: General experience of comfort will improve Outcome: Adequate for Discharge   Problem: Safety: Goal: Ability to remain free from injury will improve Outcome: Adequate for Discharge   Problem: Skin Integrity: Goal: Risk for impaired skin integrity will decrease Outcome: Adequate for Discharge   Problem: Education: Goal: Knowledge of disease or condition will improve Outcome: Adequate for Discharge Goal: Knowledge of secondary prevention will improve Outcome: Adequate for Discharge Goal: Knowledge of patient specific risk factors addressed and post discharge goals established will improve Outcome:  Adequate for Discharge Goal: Individualized Educational Video(s) Outcome: Adequate for Discharge   Problem: Coping: Goal: Will verbalize positive feelings about self Outcome: Adequate for Discharge Goal: Will identify appropriate support needs Outcome: Adequate for Discharge   Problem: Health Behavior/Discharge Planning: Goal: Ability to manage health-related needs will improve Outcome: Adequate for Discharge   Problem: Nutrition: Goal: Risk of aspiration will decrease Outcome: Adequate for Discharge   Problem: Ischemic Stroke/TIA Tissue Perfusion: Goal: Complications of ischemic stroke/TIA will be minimized Outcome: Adequate for Discharge

## 2021-04-15 NOTE — Discharge Summary (Signed)
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  Fluoroscopy Time: 27 minutes 54 seconds (1126 mGy). COMPLICATIONS: None immediate. TECHNIQUE: Following a full explanation of the procedure along with the potential associated complications, an informed witnessed consent was obtained. The risks of intracranial hemorrhage  of 10%, worsening neurological deficit, ventilator dependency, death and inability to revascularize were all reviewed in detail with the patient's daughter. The patient was then put under general anesthesia by the Department of Anesthesiology at [facility]. The right groin was prepped and draped in the usual sterile fashion. Thereafter using modified Seldinger technique, transfemoral access into the right common femoral artery was obtained without difficulty. Over a 0.035 inch guidewire an 8 [place] Pinnacle 25 cm sheath was inserted. Through this, and also over a 0.035 inch guidewire a 5.5 [place] Berenstein support catheter inside of an 087  balloon guide catheter which been prepped with 50% contrast and 50% heparinized saline infusion was advanced to the distal left internal carotid artery. Balloon guide catheter was then advanced to the distal left ICA. The exchange guidewire, and the support 5.5 [place] catheter were retrieved and removed. Good aspiration obtained from the hub of the balloon guide catheter in the left internal carotid artery. Gentle control arteriogram performed through this demonstrated no evidence of spasms, dissections or of intraluminal filling defects. An intracranial DSA confirmed the presence of the occlusion of an M2 branch of the inferior division of the left middle cerebral artery. The remainder of the MCA branches remain widely patent. The left anterior cerebral artery is hypoplastic with flow seen distally into the left anterior cerebral A2 segment. Over a 0.014 inch standard Synchro micro guidewire, an 021 160 cm Trevo ProVue microcatheter inside of an 071 136 cm Zoom aspiration catheter was advanced to the end of balloon guide catheter. With the micro guidewire leading with a J-tip configuration, and a torque device, access was obtained through the occluded inferior division M2 branch. The microcatheter was advanced over the micro guidewire to the M2 M3 junction. Guidewire was removed. Good aspiration obtained from hub of the microcatheter. A 3 mm x 40 mm Solitaire X retrieval device was then advanced to the distal end of the microcatheter. This was then the deployed by retrieving the microcatheter into the left internal carotid artery distally. The Zoom aspiration catheter was advanced now to the distal left middle cerebral artery. With proximal flow arrest in the left internal carotid artery, and constant aspiration being applied with the 20 mL syringe at the hub of the balloon guide catheter, and with the Penumbra aspiration device at the hub of the 071 Zoom aspiration catheter for approximately 2-1/2 minutes, the  combination of the retrieval device, the microcatheter and the Zoom aspiration catheter were retrieved and removed. Flow arrest was reversed. A control arteriogram performed through the balloon guide catheter in the left internal carotid artery demonstrated complete revascularization of the previously occluded branch of the inferior division M2 segment. A TICI 3 revascularization of the left MCA territory had been achieved. The left anterior cerebral artery remained patent. A final control arteriogram performed through the balloon guide catheter in the left common carotid artery continued to demonstrate excellent flow through the internal carotid artery extra cranially and intracranially. The balloon guide was removed. An 8 French Angio-Seal closure device was applied for hemostasis at the right groin puncture site. An immediate CT of the brain demonstrated no evidence of hemorrhage or mass effect. The patient was left intubated for airway protection. He was then transferred to the neuro ICU for post thrombectomy care. FINDINGS: As above. PROCEDURE: As above. IMPRESSION: Status post endovascular complete revascularization of occluded M2 branch of the inferior division of the left middle cerebral artery with 1 pass with a 3 mm x 40 mm  Solitaire X retrieval device, and contact aspiration achieving a TICI 3 revascularization. PLAN: Follow-up as per referring MD. Electronically Signed   By: Luanne Bras M.D.   On: 04/11/2021 11:30   US SCROTUM W/DOPPLER  Result Date: 03/21/2021 CLINICAL DATA:  Left testicular swelling EXAM: SCROTAL ULTRASOUND DOPPLER ULTRASOUND OF THE TESTICLES TECHNIQUE: Complete ultrasound examination of the testicles, epididymis, and other scrotal structures was performed. Color and spectral Doppler ultrasound were also utilized to evaluate blood flow to the testicles. COMPARISON:  CT 11/11/2017 FINDINGS: Right testicle Measurements: 3.2 x 2.6 x 2.2 cm. No mass or microlithiasis visualized.  Left testicle Measurements: 3.5 x 2.0 x 2.9 cm. Testicular parenchyma appears somewhat distorted due to compression what appears to be and heterogeneous, solid/cystic paratesticular lentiform lesion or collection measuring 3.9 x 1.3 x 3.3 cm in size. A separate more complex solid/cystic lesion is inferior to the left testicle as well measuring 2.4 x 3 x 3.1 cm in size lobular without internal color Doppler flow. Right epididymis:  Poorly visualized Left epididymis: Poorly visualized portions appear heterogeneous possibly thickened. Hydrocele: Moderate right hydrocele with low level internal echoes/debris. Areas has a more solid liver moderate Varicocele:  None visualized. Pulsed Doppler interrogation of both testes demonstrates normal low resistance arterial and venous waveforms bilaterally. IMPRESSION: Complex layering paratesticular collection versus cystic lesion around the left testicle some compression of the cystic liver parenchyma additional separate paratesticular heterogeneous mass without internal color vascularity is noted inferior to the left testicle as well. Recommend correlation clinical findings as hematoma or paratesticular abscess could have these appearances. Some questionable thickening and heterogeneity of the left epididymis could reflect epididymitis as well. Moderate right hydrocele with low-level internal echoes, can reflect chronicity versus superinfection as well. Electronically Signed   By: Lovena Le M.D.   On: 03/21/2021 20:05   CT HEAD CODE STROKE WO CONTRAST  Result Date: 04/08/2021 CLINICAL DATA:  Acute headache.  Right-sided weakness and aphasia. EXAM: CT HEAD WITHOUT CONTRAST CT ANGIOGRAPHY HEAD AND NECK CT PERFUSION BRAIN TECHNIQUE: Contiguous axial images were obtained from the base of the skull through the vertex without intravenous contrast. Multidetector CT imaging of the head and neck was performed using the standard protocol during bolus administration of intravenous  contrast. Multiplanar CT image reconstructions and MIPs were obtained to evaluate the vascular anatomy. Carotid stenosis measurements (when applicable) are obtained utilizing NASCET criteria, using the distal internal carotid diameter as the denominator. Multiphase CT imaging of the brain was performed following IV bolus contrast injection. Subsequent parametric perfusion maps were calculated using RAPID software. CONTRAST:  120m OMNIPAQUE IOHEXOL 350 MG/ML SOLN COMPARISON:: COMPARISON: Same day head CT.  MRA 07/01/2014. FINDINGS: CT HEAD FINDINGS Brain: No evidence of acute large vascular territory infarction, hemorrhage, hydrocephalus, extra-axial collection or mass lesion/mass effect. Moderate to advanced confluent and patchy white matter hypoattenuation, compatible with chronic microvascular ischemic disease. Remote inferior left cerebellar infarct. ASPECTS 10. Vascular: See below. Skull: No acute fracture. Sinuses: Mild-to-moderate scattered ethmoid air cell mucosal thickening. Small left maxillary sinus, partially imaged. 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 Review of the MIP images confirms the above findings CTA NECK FINDINGS Aortic arch: Great vessel origins are patent. Right carotid system: Calcific and noncalcific atherosclerosis at the carotid bifurcation without greater than 50% narrowing. Left carotid system: Calcific and noncalcific atherosclerosis at the carotid bifurcation without greater than 50% narrowing. Vertebral arteries: Streak artifact limits evaluation lower neck; however, suspected occlusion of bilateral vertebral  artery origins with reconstitution in the mid neck, likely from collaterals. Irregular opacification of more distal right vertebral artery with multifocal narrowing. Focal severe stenosis of the left vertebral artery at C4-C5. Skeleton: No acute abnormality. Severe multilevel degenerative change in lower  cervical spine. Other neck: Secretions in the trachea, placing the patient at risk for aspiration. Upper chest: Emphysema. Review of the MIP images confirms the above findings CTA HEAD FINDINGS Anterior circulation: Extensive calcific atherosclerosis of bilateral cavernous and paraclinoid ICAs with severe right and moderate left paraclinoid ICA narrowing. Patent M1 MCAs. Focal short-segment opacification of a left distal M2 MCA branch with occlusion. This vessel is non-opacified more proximally in the sylvian fissure, compatible with a more proximal M2 occlusion or high-grade stenosis. The exact location of M2 occlusion/stenosis is not confidently identified. Patent ACAs. No visible aneurysm. Posterior circulation: Small vertebrobasilar system. Moderate right intradural vertebral artery stenosis due to calcific atherosclerosis. Small bilateral posterior cerebral arteries. Small bilateral posterior communicating arteries. Moderate right P2 PCA stenosis. Otherwise mild multifocal bilateral PCA narrowing. CT Brain Perfusion Findings: ASPECTS: 10 CBF (<30%) Volume: 21m Perfusion (Tmax>6.0s) volume: 360mMismatch Volume: 3136mNFARCT LOCATION: Left posterior MCA territory IMPRESSION: CT head: 1. No evidence of acute large vascular territory infarct or hemorrhage by CT. ASPECTS 10. 2. Moderate to advanced chronic microvascular ischemic disease. 3. Remote inferior left cerebellar infarct. 4. 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 CT perfusion: 1. Rapid calculates a 31 mL area of mismatch/penumbra in the posterior left MCA territory with 7 mL core infarct. CTA head: 1. Focal short-segment opacification of a left distal M2 MCA branch with occlusion. This vessel is non-opacified more proximally in the sylvian fissure, compatible with a more proximal M2 occlusion or high-grade stenosis. The exact location of M2 occlusion/stenosis is not confidently identified. Catheter  arteriogram could further characterize if clinically indicated. 2. Severe right and moderate left paraclinoid ICA narrowing due to calcific atherosclerosis. 3. 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 4. Superimposed moderate right intradural and right P2 PCA stenosis. CTA neck: 1. Streak artifact limits evaluation proximally with suspected age indeterminate occlusion of bilateral vertebral artery origins with reconstitution in the mid neck. Multifocal narrowing of the remainder of the right vertebral artery and focal narrowing of the left vertebral artery at C4-C5. 2. Secretions in the trachea, placing the patient at risk for aspiration. 3. Emphysema. Critical findings discussed with Dr. StaQuinn Axea telephone at 10:40 a.m. Electronically Signed   By: FreMargaretha Sheffield   On: 04/08/2021 11:29   CT ANGIO HEAD NECK W WO CM W PERF (CODE STROKE)  Result Date: 04/08/2021 CLINICAL DATA:  Acute headache.  Right-sided weakness and aphasia. EXAM: CT HEAD WITHOUT CONTRAST CT ANGIOGRAPHY HEAD AND NECK CT PERFUSION BRAIN TECHNIQUE: Contiguous axial images were obtained from the base of the skull through the vertex without intravenous contrast. Multidetector CT imaging of the head and neck was performed using the standard protocol during bolus administration of intravenous contrast. Multiplanar CT image reconstructions and MIPs were obtained to evaluate the vascular anatomy. Carotid stenosis measurements (when applicable) are obtained utilizing NASCET criteria, using the distal internal carotid diameter as the denominator. Multiphase CT imaging of the brain was performed following IV bolus contrast injection. Subsequent parametric perfusion maps were calculated using RAPID software. CONTRAST:  100m83mNIPAQUE IOHEXOL 350 MG/ML SOLN COMPARISON:: COMPARISON: Same day head CT.  MRA 07/01/2014. FINDINGS: CT HEAD FINDINGS  Brain: No evidence  of acute large vascular territory infarction, hemorrhage, hydrocephalus, extra-axial collection or mass lesion/mass effect. Moderate to advanced confluent and patchy white matter hypoattenuation, compatible with chronic microvascular ischemic disease. Remote inferior left cerebellar infarct. ASPECTS 10. Vascular: See below. Skull: No acute fracture. Sinuses: Mild-to-moderate scattered ethmoid air cell mucosal thickening. Small left maxillary sinus, partially imaged. 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 Review of the MIP images confirms the above findings CTA NECK FINDINGS Aortic arch: Great vessel origins are patent. Right carotid system: Calcific and noncalcific atherosclerosis at the carotid bifurcation without greater than 50% narrowing. Left carotid system: Calcific and noncalcific atherosclerosis at the carotid bifurcation without greater than 50% narrowing. Vertebral arteries: Streak artifact limits evaluation lower neck; however, suspected occlusion of bilateral vertebral artery origins with reconstitution in the mid neck, likely from collaterals. Irregular opacification of more distal right vertebral artery with multifocal narrowing. Focal severe stenosis of the left vertebral artery at C4-C5. Skeleton: No acute abnormality. Severe multilevel degenerative change in lower cervical spine. Other neck: Secretions in the trachea, placing the patient at risk for aspiration. Upper chest: Emphysema. Review of the MIP images confirms the above findings CTA HEAD FINDINGS Anterior circulation: Extensive calcific atherosclerosis of bilateral cavernous and paraclinoid ICAs with severe right and moderate left paraclinoid ICA narrowing. Patent M1 MCAs. Focal short-segment opacification of a left distal M2 MCA branch with occlusion. This vessel is non-opacified more proximally in the sylvian fissure, compatible with a more proximal M2 occlusion or  high-grade stenosis. The exact location of M2 occlusion/stenosis is not confidently identified. Patent ACAs. No visible aneurysm. Posterior circulation: Small vertebrobasilar system. Moderate right intradural vertebral artery stenosis due to calcific atherosclerosis. Small bilateral posterior cerebral arteries. Small bilateral posterior communicating arteries. Moderate right P2 PCA stenosis. Otherwise mild multifocal bilateral PCA narrowing. CT Brain Perfusion Findings: ASPECTS: 10 CBF (<30%) Volume: 50m Perfusion (Tmax>6.0s) volume: 393mMismatch Volume: 3118mNFARCT LOCATION: Left posterior MCA territory IMPRESSION: CT head: 1. No evidence of acute large vascular territory infarct or hemorrhage by CT. ASPECTS 10. 2. Moderate to advanced chronic microvascular ischemic disease. 3. Remote inferior left cerebellar infarct. 4. 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 CT perfusion: 1. Rapid calculates a 31 mL area of mismatch/penumbra in the posterior left MCA territory with 7 mL core infarct. CTA head: 1. Focal short-segment opacification of a left distal M2 MCA branch with occlusion. This vessel is non-opacified more proximally in the sylvian fissure, compatible with a more proximal M2 occlusion or high-grade stenosis. The exact location of M2 occlusion/stenosis is not confidently identified. Catheter arteriogram could further characterize if clinically indicated. 2. Severe right and moderate left paraclinoid ICA narrowing due to calcific atherosclerosis. 3. 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 4. Superimposed moderate right intradural and right P2 PCA stenosis. CTA neck: 1. Streak artifact limits evaluation proximally with suspected age indeterminate occlusion of bilateral vertebral artery origins with reconstitution in the mid neck. Multifocal narrowing of the  remainder of the right vertebral artery and focal narrowing of the left vertebral artery at C4-C5. 2. Secretions in the trachea, placing the patient at risk for aspiration. 3. Emphysema. Critical findings discussed with Dr. StaQuinn Axea telephone at 10:40 a.m. Electronically Signed   By: FreMargaretha Sheffield   On: 04/08/2021 11:29      HISTORY OF PRESENT ILLNESS  Brad Harrell  a 75 yo male who presented via EMS to the ED for AMS. After bedding the patient in the ED, staff noted global aphasia with right sided hemiparesis and a code stroke was called. Patient was brought emergently to the CT suite.   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   NP called nurse at rehab facility to pin down LKW. Per nurse, when she checked on patient this a.m., he was asleep, so she did not wake him. Per nurse, night shift saw him well, but nurse did not know what time. PT went in to work with patient at 0830-0900, and patient could not sit up and was aphasic. 911 was called by SNF staff. Last night, his daughter saw him and she stated he was his normal self-talking and joking around. He also walked with walker yesterday.   NP spoke with patient's daughter over the phone to see what patient can normally perform. He requires some assistance at home but can ambulate independently and his calculated MRS is 2.   Patient is not a candidate for tPA due to unknown LKW. NIHSS = 17. CTH NAICP. CTA H&N showed occlusion M2 branch L MCA and corresponding 31cc area mismatch/penumbra in posterior L MCA territory with 7 cc core infarct. Dr. Ladoris Gene and Dr. Quinn Axe both discussed the risks and benefits of procedure with daughter and Rayshun Kandler by phone. We explained that currently patient was unable to  communicate and paralyzed on the R side and that those deficits are expected to persist in absence of intervention. We explained the risks of the procedure including 10% risk ICH. She wishes to proceed.    Per daughter, patient is on Plavix/ASA due to 3 past strokes.    LKW: unknown tPA- no, unknown LKW IR?-yes, LVO MRS-2   HOSPITAL COURSE [name] is a 75 year old male with a past medical history significant for stroke, HTN, HLD, DMII, prior tobacco use who presented to [facility] from SNF with global aphasia. Imaging revealed a distal L M2 occlusion which was treated with thrombectormy with TICI 3 reperfusion on 10Jun2022.    #Left MCA Stroke Initial CTH w/NAICP. CTA Head and Neck was pertinent for distal L M2 occlusion versus severe stenosis + severe right and moderate left paraclinoid ICA narrowing due to calcific atherosclerosis. MRI Brain revealed patchy acute cortical infarction in the left MCA distribution. Punctate right frontal parietal cortex infarct. Echocardiogram was notable for reduced LVEF at 25-30%, global hypokinesis LA normal in size. Of note, EF was stable compared to Echo done in May. Stroke labs: LDL 53, HgbA1c 5.3. For secondary stroke prevention, given his significant ICAD it was decided to treat him with DAPT ( ASA 325 mg and Plavix 75 mg) for 90 days. His stroke etiology is cryptogenic; atheroembolism versus cardioembolic w/reduced EF - Continue DAPT for 90 days then ASA monotherapy after -  Cards plans for 30 day cardiac monitor on discharge, TEE repeat in 3 months   #Hyperlipidemia - His LDL is 53 on 11Jun2022 which is at goal < 70 from a stroke reduction stand point.  - Continue home Atorvastatin 20 mg QD.   #Hypertension #Chronic systolic HF He was diagnosed with HF in May of this year when he presented to the ED with scrotal pain. EF  from May admission is stable when compared to EF this admission- 25 to 30%. Cardiology was consulted for HF regimen  optimization + opinion on AC in the setting of reduced EF at 25-30 %. They recommend the following: - Lasix 20 mg QD, Coreg 3.125 BID, Losartan 25 mg QD - Will need outpatient ischemic evaluation when acute issue resolved with Lexiscan, appt has made on 04/22/21 - Prophylactic AC is not needed in the absence of LV/LAA thrombus or documented Afib - TEE EF 35% no LV or LA thrombus - Cards plans for 30 day cardiac monitor on discharge to evaluate for possible ICD device due to reduced LVEF   #Diabetes type II Controlled HgbA1c 5.3, goal < 7.0 - Treat with SSI while inpatient - Continue home metformin 563m QD on discharge    #Hypothyroidism - Continue home synthroid 10010m daily   #Urinary retention 2/2 BPH - Continue home finasteride   #Heme/ID No active infectious concerns. Trending hemoglobin given it dropped to 8.1 but appears to have stabilized, currently 9.0 - FFWU if he fevers   #Dispo PT/OR are recommending SNF. He can return to his prior facility on discharge.   DISCHARGE EXAM Blood pressure 108/65, pulse 70, temperature 98 F (36.7 C), temperature source Oral, resp. rate 16, weight 75 kg, SpO2 96 %.  General: Resting comfortably in bed. In NAD. HEENT-  Normocephalic, no lesions, without obvious abnormality.  Normal external eye and conjunctiva on the left. Right eye disconjugate with the left, clouded iris and pupil.   Cardiovascular- pulses palpable throughout   Lungs-Breathing comfortably on room air Skin-warm and dry, intact   Neurologic Exam: Mental Status: Alert, oriented, to self, location, month; believes it is 2002. Thought content otherwise appropriate.  Speech fluent without evidence of aphasia. Cranial Nerves: II:  Visual fields grossly normal, pupils equal, round, reactive to light and accommodation on the left only.  III,IV, VI: ptosis not present, extra-ocular motions intact bilaterally, L>R V,VII: smile symmetric, facial light touch sensation normal  bilaterally VIII: hearing normal bilaterally IX,X: uvula rises symmetrically XI: bilateral shoulder shrug equal in strength XII: midline tongue extension without atrophy or fasciculations Motor: Upper extremities 5/5; RLE 4/5, LLE: 5/5 Tone and bulk:normal tone throughout; no atrophy noted DTR: 2+ throughout except RLE: 1+ Sensory: Intact to light touch intact throughout, bilaterally Gait: Deferred  Discharge Diet       Diet   Diet Heart Room service appropriate? Yes; Fluid consistency: Thin   liquids  DISCHARGE PLAN Disposition:  back to SNF aspirin 325 mg daily and clopidogrel 75 mg daily for secondary stroke prevention for 3 months then ASA alone. Ongoing stroke risk factor control by Primary Care Physician at time of discharge Follow-up PCP AvNolene EbbsMD in 2 weeks. [facility], office to schedule an appointment.   35 minutes were spent preparing discharge.  JiRosalin HawkingMD PhD Stroke Neurology 04/15/2021 1:39 PM

## 2021-04-15 NOTE — TOC Transition Note (Signed)
Transition of Care [facility]) - CM/SW Discharge Note   Patient Details  Name: DEMAURI ADVINCULA MRN: [id number] Date of Birth: 03-09-1946  Transition of Care [facility]) CM/SW Contact:  Geralynn Ochs, LCSW Phone Number: 04/15/2021, 2:19 PM   Clinical Narrative:   Nurse to call report to [phone], Room 109.    Final next level of care: Skilled Nursing Facility Barriers to Discharge: Barriers Resolved   Patient Goals and CMS Choice Patient states their goals for this hospitalization and ongoing recovery are:: Rehab CMS Medicare.gov Compare Post Acute Care list provided to:: Patient Represenative (must comment) Choice offered to / list presented to : Adult Children  Discharge Placement              Patient chooses bed at:  ([facility]) Patient to be transferred to facility by: [place] Name of family member notified: [name] Patient and family notified of of transfer: 04/15/21  Discharge Plan and Services In-house Referral: Clinical Social Work   Post Acute Care Choice: [place]                               Social Determinants of Health (SDOH) Interventions     Readmission Risk Interventions No flowsheet data found.

## 2021-04-15 NOTE — Progress Notes (Signed)
Interval History:  No acute events overnight. Neurologically stable. TEE revealed EF of 25-30%.   On my visit, Brad Harrell was resting comfortably in bed. No complaints.   Hospital day 7.  Pertinent Imaging:  Echocardiogram 03/24/2021:  Left Ventricle: ejection fraction, by estimation, is 25 to 30%. Left ventricular ejection fraction by 2D MOD biplane is 26.6 %.The left ventricle has severely decreased function. The left ventricle demonstrates global hypokinesis. The left ventricular internal cavity size was normal in size. There is no left ventricular hypertrophy. Indeterminate diastolic filling due to E-A fusion.  Right Ventricle: The right ventricular size is normal. No increase in  right ventricular wall thickness. Right ventricular systolic function is  mildly reduced. Tricuspid regurgitation signal is inadequate for assessing PA pressure.  IAS/Shunts: The atrial septum is grossly normal.   04/08/21 CTA Head and Neck CTA head:  1. Focal short-segment opacification of a left distal M2 MCA branch with occlusion. This vessel is non-opacified more proximally in the sylvian fissure, compatible with a more proximal M2 occlusion or high-grade stenosis. The exact location of M2 occlusion/stenosis is not confidently identified. Catheter arteriogram could further characterize if clinically indicated. 2. Severe right and moderate left paraclinoid ICA narrowing due to calcific atherosclerosis. 3. 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 4. Superimposed moderate right intradural and right P2 PCA stenosis.   CTA neck:  1. Streak artifact limits evaluation proximally with suspected age indeterminate occlusion of bilateral vertebral artery origins with reconstitution in the mid neck. Multifocal narrowing of the remainder of the right vertebral artery   04/10/21 MRI Brain Patchy acute cortical infarction in the  left MCA distribution. Punctate right frontal parietal cortex infarct. Advanced chronic small vessel disease.   04/11/21 Echo Complete Bubble Study WO Image Enhancing Agent  1. Left ventricular ejection fraction, by estimation, is 25 to 30%. The left ventricle has severely decreased function. The left ventricle demonstrates global hypokinesis. There is mild left ventricular hypertrophy. Left ventricular diastolic parameters are consistent with Grade I diastolic dysfunction (impaired relaxation).   2. No apical thrombus seen, though in setting of severe LV dysfunction and presentation with CVA, would consider limited echo with contrast to rule out LV thrombus   3. Right ventricular systolic function is normal. The right ventricular size is normal. Tricuspid regurgitation signal is inadequate for assessing PA pressure.   4. The mitral valve is normal in structure. Trivial mitral valve regurgitation.   5. The aortic valve is tricuspid. Aortic valve regurgitation is not visualized. No aortic stenosis is present.   6. The inferior vena cava is normal in size with greater than 50% respiratory variability, suggesting right atrial pressure of 3 mmHg.   7. Agitated saline contrast bubble study was negative, with no evidence of any interatrial shunt.  Current vital signs: BP (!) 110/53 (BP Location: Right Arm)   Pulse 73   Temp 98.1 F (36.7 C) (Oral)   Resp 16   Wt 75 kg   SpO2 99%   BMI 29.29 kg/m  Vital signs in last 24 hours: Temp:  [97.6 F (36.4 C)-99.2 F (37.3 C)] 98.1 F (36.7 C) (06/17 0759) Pulse Rate:  [65-100] 73 (06/17 0759) Resp:  [15-20] 16 (06/17 0759) BP: (71-168)/(47-89) 110/53 (06/17 0759) SpO2:  [99 %-100 %] 99 % (06/17 0759)  General Exam   General: Resting comfortably in bed. In NAD. HEENT-  Normocephalic, no lesions, without obvious abnormality.  Normal external eye and conjunctiva  on the left. Right eye disconjugate with the left, clouded iris and pupil.    Cardiovascular- pulses palpable throughout   Lungs-Breathing comfortably on room air Skin-warm and dry, intact  Neurologic Exam: Mental Status: Alert, oriented, to self, location, month; believes it is 2002. Thought content otherwise appropriate.  Speech fluent without evidence of aphasia.  Cranial Nerves: II:  Visual fields grossly normal, pupils equal, round, reactive to light and accommodation on the left only.  III,IV, VI: ptosis not present, extra-ocular motions intact bilaterally, L>R V,VII: smile symmetric, facial light touch sensation normal bilaterally VIII: hearing normal bilaterally IX,X: uvula rises symmetrically XI: bilateral shoulder shrug equal in strength XII: midline tongue extension without atrophy or fasciculations Motor: Upper extremities 5/5; RLE 4/5, LLE: 5/5 Tone and bulk:normal tone throughout; no atrophy noted DTR: 2+ throughout except RLE: 1+ Sensory: Intact to light touch intact throughout, bilaterally Gait: Deferred  Lab Results: Basic Metabolic Panel: Recent Labs  Lab 04/11/21 0039 04/12/21 0626 04/13/21 0532 04/14/21 0133 04/15/21 0401  NA 138 137 139 136 136  K 4.2 4.8 4.6 4.3 4.6  CL 106 108 108 106 107  CO2 25 21* 23 24 22   GLUCOSE 102* 98 99 115* 122*  BUN 12 13 13 20 21   CREATININE 0.98 1.04 1.00 1.11 1.00  CALCIUM 8.7* 9.7 9.2 8.9 8.9   CBC: Recent Labs  Lab 04/08/21 1431 04/09/21 0500 04/11/21 0039 04/12/21 0921 04/13/21 0532 04/14/21 0133 04/15/21 0401  WBC 12.2* 7.3 3.7* 4.7 4.6 4.5 5.6  NEUTROABS 11.3* 4.5  --   --   --   --   --   HGB 10.3* 8.6* 8.1* 8.7* 9.5* 8.8* 9.0*  HCT 32.4* 27.3* 26.3* 27.7* 32.7* 28.6* 29.2*  MCV 82.4 83.7 82.7 82.7 87.2 85.1 83.4  PLT 360 266 207 177 188 207 212    Lipid Panel: Recent Labs  Lab 04/09/21 0500  CHOL 108  TRIG 183*  HDL 18*  CHOLHDL 6.0  VLDL 37  LDLCALC 53    Assessment/Plan:  Brad Harrell is a 75 year old male with a past medical history significant for  stroke, HTN, HLD, DMII, prior tobacco use who presented to [facility] from SNF with global aphasia. Imaging revealed a distal L M2 occlusion which was treated with thrombectormy with TICI 3 reperfusion on 10Jun2022.   #Left MCA Stroke Initial CTH w/NAICP. CTA Head and Neck was pertinent for distal L M2 occlusion versus severe stenosis + severe right and moderate left paraclinoid ICA narrowing due to calcific atherosclerosis. MRI Brain revealed patchy acute cortical infarction in the left MCA distribution. Punctate right frontal parietal cortex infarct. Echocardiogram was notable for reduced LVEF at 25-30%, global hypokinesis LA normal in size. Of note, EF was stable compared to Echo done in May. Stroke labs: LDL 53, HgbA1c 5.3. For secondary stroke prevention, given his significant ICAD it was decided to treat him with DAPT ( ASA 325 mg and Plavix 75 mg) for 90 days. His stroke etiology is cryptogenic; atheroembolism versus cardioembolic w/reduced EF - Continue DAPT for 90 days then ASA monotherapy after -  Cards plans for 30 day cardiac monitor on discharge, TEE repeat in 3 months   #Hyperlipidemia His LDL is 53 on 11Jun2022 which is at goal < 70 from a stroke reduction stand point.  - Continue home Atorvastatin 20 mg QD.   #Hypertension #Chronic systolic HF He was diagnosed with HF in May of this year when he presented to the ED with scrotal pain.  EF from May admission is stable when compared to EF this admission- 25 to 30%. Cardiology was consulted for HF regimen optimization + opinion on AC in the setting of reduced EF at 25-30 %. They recommend the following: - Lasix 20 mg QD, Coreg 3.125 BID, Losartan 25 mg QD - Will need outpatient ischemic evaluation when acute issue resolved with Lexiscan, appt has made on 04/22/21 - Prophylactic AC is not needed in the absence of LV/LAA thrombus or documented Afib - TEE  Cards plans for 30 day cardiac monitor on discharge to evaluate for possible ICD device due  to reduced LVEF   Stroke Dysphagia Screening Evaluated by SLP on 6/11 and they recommended regular diet with thin liquids. No post stroke dysphagia.  - On Heart diet following TEE   #Diabetes type II Controlled HgbA1c 5.3, goal < 7.0 - Treat with SSI while inpatient - Continue home metformin 500mg  QD on discharge    #Hypothyroidism - Continue home synthroid 1070mcg daily   #Urinary retention 2/2 BPH - Continue home finasteride   #Heme/ID No active infectious concerns. Trending hemoglobin given it dropped to 8.1 but appears to have stabilized, currently 9.0 - FFWU if he fevers   #Dispo PT/OR are recommending SNF. He can return to his prior facility on discharge.  Minette Headland, PhD, PA-C [facility] [phone]  04/15/2021, 10:57 AM

## 2021-04-15 NOTE — Progress Notes (Signed)
Occupational Therapy Treatment Patient Details Name: Brad Harrell MRN: [id number] DOB: Sep 20, 1946 Today's Date: 04/15/2021    History of present illness 75 yo male presented 6/10 with R-sided weakness and global aphasia. Pt found to have L MCA occluded, s/p revacularization 6/10. PMH HLD HTN tobacco abuse polysubstance abuse DM2 CVA 2015 ARF CHF   OT comments  Patient stating he was preparing to go to rehab today.  Agreed to out of bed, bathroom use and stand grooming prior to eating lunch.  Barriers are listed below, but he is still needing up to Min A for basic mobility in the room at RW level, but lower body ADL has increased to Min A at sitting level.  OT will continue to follow in the acute setting, but it appears he is in fact transitioning to SNF for continued rehab prior to home.    Follow Up Recommendations  SNF    Equipment Recommendations  None recommended by OT    Recommendations for Other Services      Precautions / Restrictions Precautions Precautions: Fall Precaution Comments: watch HR Restrictions Weight Bearing Restrictions: No       Mobility Bed Mobility Overal bed mobility: Needs Assistance Bed Mobility: Supine to Sit;Sit to Supine     Supine to sit: Supervision Sit to supine: Supervision   General bed mobility comments: supervision for safety, no physical assist needed Patient Response: Impulsive  Transfers Overall transfer level: Needs assistance Equipment used: Rolling walker (2 wheeled) Transfers: Sit to/from Stand Sit to Stand: Min guard         General transfer comment: min guard for safety, cues for hand placement    Balance Overall balance assessment: Needs assistance Sitting-balance support: No upper extremity supported;Feet supported Sitting balance-Leahy Scale: Fair Sitting balance - Comments: Min guard for safety as pt is impulsive.   Standing balance support: Bilateral upper extremity supported Standing balance-Leahy Scale:  Poor Standing balance comment: reliant on UE support of RW to maintain dynamic balance                           ADL either performed or assessed with clinical judgement   ADL       Grooming: Wash/dry hands;Wash/dry face;Oral care;Standing;Min guard               Lower Body Dressing: Minimal assistance;Sitting/lateral leans Lower Body Dressing Details (indicate cue type and reason): adjust socks Toilet Transfer: Minimal assistance;Ambulation;Regular Toilet;Grab bars;RW           Functional mobility during ADLs: Minimal assistance;Rolling walker       Vision       Perception     Praxis      Cognition Arousal/Alertness: Awake/alert Behavior During Therapy: WFL for tasks assessed/performed Overall Cognitive Status: Impaired/Different from baseline Area of Impairment: Memory;Awareness;Safety/judgement                     Memory: Decreased short-term memory   Safety/Judgement: Decreased awareness of safety;Decreased awareness of deficits Awareness: Intellectual            Exercises Exercises: General Lower Extremity General Exercises - Lower Extremity Long Arc Quad: Both;10 reps;Seated Hip ABduction/ADduction: Both;10 reps;Supine Straight Leg Raises: Both;10 reps;Supine Hip Flexion/Marching: Both;10 reps;Seated   Shoulder Instructions       General Comments      Pertinent Vitals/ Pain       Pain Assessment: No/denies pain  Frequency  Min 2X/week        Progress Toward Goals  OT Goals(current goals can now be found in the care plan section)  Progress towards OT goals: Progressing toward goals  Acute Rehab OT Goals Patient Stated Goal: get a little better OT Goal Formulation: With patient Time For Goal Achievement: 04/23/21 Potential to Achieve Goals: [place] Discharge plan remains appropriate    Co-evaluation                  AM-PAC OT "6 Clicks" Daily Activity     Outcome Measure   Help from another person eating meals?: None Help from another person taking care of personal grooming?: None Help from another person toileting, which includes using toliet, bedpan, or urinal?: A Little Help from another person bathing (including washing, rinsing, drying)?: A Little Help from another person to put on and taking off regular upper body clothing?: A Little Help from another person to put on and taking off regular lower body clothing?: A Little 6 Click Score: 20    End of Session Equipment Utilized During Treatment: Rolling walker  OT Visit Diagnosis: Unsteadiness on feet (R26.81);Other abnormalities of gait and mobility (R26.89);Muscle weakness (generalized) (M62.81)   Activity Tolerance Patient tolerated treatment well   Patient Left in chair;with call bell/phone within reach;with chair alarm set   Nurse Communication Mobility status;Precautions        Time: 1694-5038 OT Time Calculation (min): 12 min  Charges: OT General Charges $OT Visit: 1 Visit OT Treatments $Self Care/Home Management : 8-22 mins  04/15/2021  Rich, OTR/L  Acute Rehabilitation Services  Office:  [phone]    Metta Clines 04/15/2021, 2:11 PM

## 2021-04-15 NOTE — Progress Notes (Signed)
Progress Note  Patient Name: Brad Harrell Date of Encounter: 04/15/2021  [facility] Cardiologist: None   Subjective   Patient is comfortable today. No chest pain or SOB.  Underwent TEE yesterday which showed no embolic source, LVEF 95%, no significant valve disease  Blood pressures stable 110s/60s Cr stable at 1.0; on maintenance PO diuretic lasix 20mg  QOD  Inpatient Medications    Scheduled Meds:  aspirin EC  325 mg Oral Daily   atorvastatin  20 mg Oral Daily   carvedilol  3.125 mg Oral BID WC   Chlorhexidine Gluconate Cloth  6 each Topical Daily   clopidogrel  75 mg Oral Daily   enoxaparin (LOVENOX) injection  40 mg Subcutaneous Q24H   finasteride  5 mg Oral Daily   furosemide  20 mg Oral QODAY   insulin aspart  0-9 Units Subcutaneous Q4H   levothyroxine  100 mcg Oral QAC breakfast   losartan  25 mg Oral Daily   mouth rinse  15 mL Mouth Rinse BID   pantoprazole  40 mg Oral QHS   spironolactone  12.5 mg Oral Daily   Continuous Infusions:   PRN Meds: acetaminophen **OR** acetaminophen (TYLENOL) oral liquid 160 mg/5 mL **OR** acetaminophen, HYDROcodone-acetaminophen, senna-docusate   Vital Signs    Vitals:   04/14/21 2105 04/14/21 2349 04/15/21 0357 04/15/21 0759  BP: 106/65 116/64 111/66 (!) 110/53  Pulse:  86 77 73  Resp:  18 18 16   Temp:  98.3 F (36.8 C) 98.4 F (36.9 C) 98.1 F (36.7 C)  TempSrc:  Oral Oral Oral  SpO2:  100% 100% 99%  Weight:        Intake/Output Summary (Last 24 hours) at 04/15/2021 0857 Last data filed at 04/15/2021 0334 Gross per 24 hour  Intake 320 ml  Output 750 ml  Net -430 ml    Last 3 Weights 04/08/2021 03/30/2021 03/29/2021  Weight (lbs) 165 lb 5.5 oz 156 lb 8.4 oz 160 lb 15 oz  Weight (kg) 75 kg 71 kg 73 kg      Telemetry    NSR- Personally Reviewed  ECG    No new tracing- Personally Reviewed  Physical Exam   GEN: No acute distress. Laying in bed  Neck: No JVD Cardiac: RRR, no murmurs, rubs, or  gallops.  Respiratory: CTAB GI: Soft, nontender, non-distended  MS: No edema; No deformity. Neuro:  Alert and oriented to person and place today Psych: Normal affect   Labs    High Sensitivity Troponin:  No results for input(s): TROPONINIHS in the last 720 hours.    Chemistry Recent Labs  Lab 04/08/21 1907 04/09/21 0500 04/13/21 0532 04/14/21 0133 04/15/21 0401  NA 134*   < > 139 136 136  K 4.9   < > 4.6 4.3 4.6  CL 106   < > 108 106 107  CO2 21*   < > 23 24 22   GLUCOSE 218*   < > 99 115* 122*  BUN 17   < > 13 20 21   CREATININE 0.94   < > 1.00 1.11 1.00  CALCIUM 8.8*   < > 9.2 8.9 8.9  PROT 6.6  --   --   --   --   ALBUMIN 2.6*  --   --   --   --   AST 18  --   --   --   --   ALT 10  --   --   --   --  ALKPHOS 53  --   --   --   --   BILITOT 0.6  --   --   --   --   GFRNONAA >60   < > >60 >60 >60  ANIONGAP 7   < > 8 6 7    < > = values in this interval not displayed.      Hematology Recent Labs  Lab 04/13/21 0532 04/14/21 0133 04/15/21 0401  WBC 4.6 4.5 5.6  RBC 3.75* 3.36* 3.50*  HGB 9.5* 8.8* 9.0*  HCT 32.7* 28.6* 29.2*  MCV 87.2 85.1 83.4  MCH 25.3* 26.2 25.7*  MCHC 29.1* 30.8 30.8  RDW 19.2* 19.3* 19.1*  PLT 188 207 212     BNPNo results for input(s): BNP, PROBNP in the last 168 hours.   DDimer No results for input(s): DDIMER in the last 168 hours.   Radiology    No results found.  Cardiac Studies   TEE 04/14/21: LVEF 35%, no LV or LAA thrombus, no significant valve disease  TTE on 04/11/21:   1. Left ventricular ejection fraction, by estimation, is 25 to 30%. The  left ventricle has severely decreased function. The left ventricle  demonstrates global hypokinesis. There is mild left ventricular  hypertrophy. Left ventricular diastolic parameters   are consistent with Grade I diastolic dysfunction (impaired relaxation).   2. No apical thrombus seen, though in setting of severe LV dysfunction  and presentation with CVA, would consider  limited echo with contrast to  rule out LV thrombus   3. Right ventricular systolic function is normal. The right ventricular  size is normal. Tricuspid regurgitation signal is inadequate for assessing  PA pressure.   4. The mitral valve is normal in structure. Trivial mitral valve  regurgitation.   5. The aortic valve is tricuspid. Aortic valve regurgitation is not  visualized. No aortic stenosis is present.   6. The inferior vena cava is normal in size with greater than 50%  respiratory variability, suggesting right atrial pressure of 3 mmHg.   7. Agitated saline contrast bubble study was negative, with no evidence  of any interatrial shunt.   Echo from 03/24/21:   1. Left ventricular ejection fraction, by estimation, is 25 to 30%. Left  ventricular ejection fraction by 2D MOD biplane is 26.6 %. The left  ventricle has severely decreased function. The left ventricle demonstrates  global hypokinesis. Indeterminate  diastolic filling due to E-A fusion.   2. Right ventricular systolic function is mildly reduced. The right  ventricular size is normal. Tricuspid regurgitation signal is inadequate  for assessing PA pressure.   3. The mitral valve is grossly normal. Trivial mitral valve  regurgitation. No evidence of mitral stenosis.   4. The aortic valve is tricuspid. Aortic valve regurgitation is not  visualized. No aortic stenosis is present.   5. There is mild dilatation of the ascending aorta, measuring 42 mm.   6. The inferior vena cava is normal in size with greater than 50%  respiratory variability, suggesting right atrial pressure of 3 mmHg.     Echo from 07/01/2014:  - Left ventricle: The cavity size was normal. Wall thickness was    normal. Systolic function was moderately to severely reduced. The    estimated ejection fraction was in the range of 30% to 35%.    Diffuse hypokinesis. Doppler parameters are consistent with    abnormal left ventricular relaxation (grade 1  diastolic    dysfunction).  - Right atrium: The atrium  was mildly dilated.    Patient Profile     75 y.o. male with a hx of chronic systolic and diastolic heart failure, cardiomyopathy of unclear etiology, HTN, HLD, type 2 DM, BPH, CVA x4, hypothyroidism, tobacco use, recent admission 03/21/21- 03/30/21 at [facility] for sepsis due to left epididymoorchitis with abscess s/p left scrotal exploration with left orchiectomy and drainage of pyocele and TURP, who is currently admitted for acute left MCA CVA s/p thrombectomy with TICI 3 revascularization. Cardiology is consulted for further management of chronic combined systolic and diastolic heart failure and AC management in the setting of possible cardioembolic CVA.  Assessment & Plan   #Chronic systolic and diastolic heart failure #Cardiomyopathy of unclear etiology Patient with known history of LV systolic dysfunction with EF 25 to 30% dating back to 2015, etiology was unclear, ? due to hx of cocaine abuse, he never followed up with Cardiology in the past in the past nor had been started on GDMT. Last seen 03/27/2021 by inpatient cardiology due to low EF, started on GDMT with Lasix 20 mg every other day, losartan 25 mg daily, and carvedilol 3.125 mg BID, and unable to add Entresto or spironolactone due to low BP. He was arranged to see cardiology on 04/22/21, however, he re-presented with acute left MCA CVA on 04/08/21 s/p thrombectomy. Repeat TTE 04/11/2021 with EF 25 to 30%, LV severely decreased function, LV global hypokinesis, mild LVH, grade 1 DD, no apical thrombus, RV normal, trivial MR, no evidence of intra atrial shunt. (No significant change from prior). Currently appears compensated on examination without evidence of volume overload. Will merit ischemic work-up as out-patient once clinically improved.  - Change lasix to 20mg  every M, W, F as will start [place] today - Continue Coreg 3.125mg  BID, losartan 25mg  daily - Did not tolerate entresto on last  admission due to hypotension; will add as able as out-patient - Continue spironolactone 12.5mg  daily - Start farxiga 10mg  daily - Will need outpatient ischemic evaluation when acute issue resolved with Lexiscan vs cath, follow-up appt has made on 04/22/21 -Plan to repeat TTE after 3 months of GDMT and if persistently <35%, will need ICD device at that time   #Acute left MCA CVA Patient with history of recurrent CVA with current admission for left MCA CVA s/p thrombectomy. No known history of Afib. TEE on 04/14/21 without cardioembolic source. Now planned for 30day cardiac monitor on discharge as may eventually need ICD device due to depressed LVEF and would not want to place loop if needs device in the near future -Plan for 30 day event monitor on discharge -Will repeat TTE on GDMT and if persistently <35%, will need ICD device -If EF recovers and 30day event monitor without evidence of Afib, will plan for loop recorder at that time -Continue ASA 325mg  daily and plavix 75mg  daily -Continue lipitor 20mg  daily   #HTN - Continue Coreg 3.125mg  BID, losartan 25mg  daily   #HLD - LDL at goal, continue lipitor 20mg  daily    #Type II DM - A1C at goal, diet controlled   #Hx of tobacco use and cocaine abuse - patient reports no use of both at this time   #Anemia - Management per primary   #BPH #Hypothyroidism #GERD - Managed  per primary team    Cardiology will sign-off at this time. Out-patient follow-up arranged. Will arrange for 30day monitor as well.   For questions or updates, please contact [place] Please consult [email or web address] for contact info under  Signed, Freada Bergeron, MD  04/15/2021, 8:57 AM

## 2021-04-15 NOTE — Progress Notes (Signed)
  Speech Language Pathology Treatment: Cognitive-Linquistic  Patient Details Name: Brad Harrell MRN: [id number] DOB: [birth year] Today's Date: 04/15/2021 Time: 1331-1400 SLP Time Calculation (min) (ACUTE ONLY): 29 min  Assessment / Plan / Recommendation Clinical Impression  Pt seen for cognitive tx with orientation improved (Ox3 with date only noted by year, but pt able to state correct day of the week).  Naming tasks completed with 60% accuracy and mod verbal/visual cues provided to complete generative naming task/confrontational naming.  Pt noted to stammer and exhibit disfluent speech intermittently during tx session with repetition noted when attempting to formulate spontaneous initiated conversation.  Discussed compensatory strategies with pt to assist with communicative competence such as describing, gesturing and using circumlocution.  Simple functional problem solving tasks with mod cues provided re: cooking/ADLs with mod cues required and 70% accuracy obtained.  Recommend ST continue in acute setting and HH ST once d/c if daughter is able to care for pt in home for safety purposes.  If this is not possible, may consider SNF.   HPI HPI: 75 yo male with stroke risk factors of previous strokes, HLD, HTN, tobacco abuse, elicit drug use, and DM II. tPA was not given due to not being able to clarify his LKW. 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 ST completed speech/language evaluation/BSE.  BSE found to be [facility] for swallowing, but new cognitive impairments noted with SLE.      SLP Plan  Continue with current plan of care       Recommendations   HH SLP vs SNF placement                Follow up Recommendations: Home health SLP;Skilled Nursing facility;24 hour supervision/assistance SLP Visit Diagnosis: Aphasia (R47.01);Cognitive communication deficit (J18.841) Plan: Continue with current plan of care                       Elvina Sidle, M.S., CCC-SLP 04/15/2021,  2:18 PM

## 2021-04-15 NOTE — Discharge Instructions (Signed)
Take the medication as prescribed Compliant with medication Follow up with neurology and cardiology as scheduled

## 2021-04-15 NOTE — Progress Notes (Signed)
Physical Therapy Treatment Patient Details Name: Brad Harrell MRN: [id number] DOB: April 05, 1946 Today's Date: 04/15/2021    History of Present Illness 75 yo male presented 6/10 with R-sided weakness and global aphasia. Pt found to have L MCA occluded, s/p revacularization 6/10. PMH HLD HTN tobacco abuse polysubstance abuse DM2 CVA 2015 ARF CHF    PT Comments    Patient progressing towards physical therapy goals. Patient continues to be impulsive requiring at least close supervision-min guard for activities. Patient ambulated 120' with minA and RW, required cues for increasing step length on L and RW proximity. Performed exercises seated EOB and supine. Continue to recommend SNF for ongoing Physical Therapy.       Follow Up Recommendations  SNF;Supervision/Assistance - 24 hour     Equipment Recommendations  Rolling Eleena Grater with 5" wheels    Recommendations for Other Services       Precautions / Restrictions Precautions Precautions: Fall Precaution Comments: watch HR Restrictions Weight Bearing Restrictions: No    Mobility  Bed Mobility Overal bed mobility: Needs Assistance Bed Mobility: Supine to Sit;Sit to Supine     Supine to sit: Supervision Sit to supine: Supervision   General bed mobility comments: supervision for safety, no physical assist needed    Transfers Overall transfer level: Needs assistance Equipment used: Rolling Leronda Lewers (2 wheeled) Transfers: Sit to/from Stand Sit to Stand: Min guard         General transfer comment: min guard for safety, cues for hand placement  Ambulation/Gait Ambulation/Gait assistance: Min assist Gait Distance (Feet): 120 Feet Assistive device: Rolling Lilou Kneip (2 wheeled) Gait Pattern/deviations: Step-through pattern;Decreased step length - left Gait velocity: decreased   General Gait Details: cues to maintain close proximity with Katja Blue. Decreased step length on L requiring cues for increasing step length   Stairs              Wheelchair Mobility    Modified Rankin (Stroke Patients Only) Modified Rankin (Stroke Patients Only) Pre-Morbid Rankin Score: Moderately severe disability Modified Rankin: Moderately severe disability     Balance Overall balance assessment: Needs assistance Sitting-balance support: No upper extremity supported;Feet supported Sitting balance-Leahy Scale: Fair Sitting balance - Comments: Min guard for safety as pt is impulsive.   Standing balance support: Bilateral upper extremity supported Standing balance-Leahy Scale: Poor Standing balance comment: reliant on UE support of RW to maintain dynamic balance                            Cognition Arousal/Alertness: Awake/alert Behavior During Therapy: WFL for tasks assessed/performed Overall Cognitive Status: Impaired/Different from baseline Area of Impairment: Memory;Awareness;Safety/judgement                     Memory: Decreased short-term memory   Safety/Judgement: Decreased awareness of safety;Decreased awareness of deficits Awareness: Intellectual          Exercises General Exercises - Lower Extremity Long Arc Quad: Both;10 reps;Seated Hip ABduction/ADduction: Both;10 reps;Supine Straight Leg Raises: Both;10 reps;Supine Hip Flexion/Marching: Both;10 reps;Seated    General Comments        Pertinent Vitals/Pain Pain Assessment: No/denies pain    Home Living                      Prior Function            PT Goals (current goals can now be found in the care plan section) Acute Rehab PT Goals Patient Stated  Goal: to return to independence PT Goal Formulation: With patient Time For Goal Achievement: 04/23/21 Potential to Achieve Goals: Good Progress towards PT goals: Progressing toward goals    Frequency    Min 3X/week      PT Plan Current plan remains appropriate    Co-evaluation              AM-PAC PT "6 Clicks" Mobility   Outcome Measure  Help  needed turning from your back to your side while in a flat bed without using bedrails?: A Little Help needed moving from lying on your back to sitting on the side of a flat bed without using bedrails?: A Little Help needed moving to and from a bed to a chair (including a wheelchair)?: A Little Help needed standing up from a chair using your arms (e.g., wheelchair or bedside chair)?: A Little Help needed to walk in hospital room?: A Little Help needed climbing 3-5 steps with a railing? : A Lot 6 Click Score: 17    End of Session Equipment Utilized During Treatment: Gait belt Activity Tolerance: Patient tolerated treatment well Patient left: in bed;with call bell/phone within reach;with bed alarm set Nurse Communication: Mobility status PT Visit Diagnosis: Unsteadiness on feet (R26.81);Other abnormalities of gait and mobility (R26.89);Muscle weakness (generalized) (M62.81);Difficulty in walking, not elsewhere classified (R26.2);Other symptoms and signs involving the nervous system (W96.759)     Time: 1638-4665 PT Time Calculation (min) (ACUTE ONLY): 17 min  Charges:  $Therapeutic Activity: 8-22 mins                     Inna Tisdell A. Gilford Rile PT, DPT Acute Rehabilitation Services Pager [phone] Office [phone]    Linna Hoff 04/15/2021, 12:54 PM

## 2021-04-16 ENCOUNTER — Encounter (HOSPITAL_COMMUNITY): Payer: Self-pay | Admitting: Internal Medicine

## 2021-04-22 ENCOUNTER — Ambulatory Visit: Payer: Medicare Other | Admitting: Physician Assistant

## 2021-04-22 ENCOUNTER — Other Ambulatory Visit: Payer: Self-pay

## 2021-04-22 ENCOUNTER — Encounter: Payer: Self-pay | Admitting: Physician Assistant

## 2021-04-22 ENCOUNTER — Telehealth: Payer: Self-pay | Admitting: Cardiology

## 2021-04-22 ENCOUNTER — Ambulatory Visit (INDEPENDENT_AMBULATORY_CARE_PROVIDER_SITE_OTHER): Payer: Medicare Other | Admitting: Physician Assistant

## 2021-04-22 VITALS — BP 116/56 | HR 86 | Ht 65.0 in | Wt 154.4 lb

## 2021-04-22 DIAGNOSIS — I639 Cerebral infarction, unspecified: Secondary | ICD-10-CM | POA: Diagnosis not present

## 2021-04-22 DIAGNOSIS — E119 Type 2 diabetes mellitus without complications: Secondary | ICD-10-CM

## 2021-04-22 DIAGNOSIS — E039 Hypothyroidism, unspecified: Secondary | ICD-10-CM

## 2021-04-22 DIAGNOSIS — Z79899 Other long term (current) drug therapy: Secondary | ICD-10-CM | POA: Diagnosis not present

## 2021-04-22 DIAGNOSIS — I5042 Chronic combined systolic (congestive) and diastolic (congestive) heart failure: Secondary | ICD-10-CM | POA: Diagnosis not present

## 2021-04-22 DIAGNOSIS — I63512 Cerebral infarction due to unspecified occlusion or stenosis of left middle cerebral artery: Secondary | ICD-10-CM

## 2021-04-22 DIAGNOSIS — D649 Anemia, unspecified: Secondary | ICD-10-CM

## 2021-04-22 DIAGNOSIS — E785 Hyperlipidemia, unspecified: Secondary | ICD-10-CM

## 2021-04-22 DIAGNOSIS — I10 Essential (primary) hypertension: Secondary | ICD-10-CM

## 2021-04-22 MED ORDER — ENTRESTO 24-26 MG PO TABS: 1.0000 | ORAL_TABLET | Freq: Two times a day (BID) | ORAL | 3 refills | Status: DC

## 2021-04-22 NOTE — Progress Notes (Signed)
Cardiology Office Note:    Date:  04/24/2021   ID:  Brad Harrell, DOB [birth year], MRN [id number]  PCP:  Nolene Ebbs, MD   [facility] Providers Cardiologist:  Minus Breeding, MD     Referring MD: Nolene Ebbs, MD   Chief Complaint  Patient presents with   Follow-up    Seen for Dr. Percival Spanish    History of Present Illness:    RIELEY Harrell is a 75 y.o. male with a hx of chronic systolic and diastolic heart failure, hypertension, hyperlipidemia, DM 2, history of recurrent CVA x4, hypothyroidism, and tobacco use.  Patient was admitted in May 2022 for sepsis and left epididymoorchitis with abscess status post left scrotal exploration with left orchiectomy and drainage of pyelocele and a TURP.  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  Cardiology service was consulted due to his chronic combined heart failure and suspicion for cardioembolic CVA.  Previous echocardiogram obtained on 03/24/2021 showed EF 25 to 30%, significant LV dysfunction, mildly reduced RV systolic function, trivial MR, mild dilatation of the ascending aorta measuring at 42 mm.  Etiology behind his previous cardiomyopathy is not clear, he had low EF since 2015.  He previously did not see any cardiologist nor take any heart failure medication.  During this admission, he was placed on appropriate heart failure medication and recommend outpatient follow-up with cardiology service.  Unfortunately, he went back to the hospital with recurrent CVA.  MRI of the brain confirmed acute cortical infarction of the left MCA distribution.  TTE with bubble study performed on 04/11/2021 showed EF 25 to 30%, severe LV dysfunction, LV global hypokinesis, grade 1 DD, mild LVH.  He was placed on 325 mg daily of aspirin and a 75 mg daily of Plavix by neurology service.  TEE performed on 04/14/2021 showed EF of 35%, no left atrial appendage thrombus, no apical thrombus.  Ischemic work-up was  deferred to outpatient.  Patient was placed on 20 mg Lasix every Monday Wednesday Friday and also [place].  He was also placed on losartan 25 mg daily with plan to start on Entresto as outpatient.  Outpatient 30-day heart monitor was also ordered as well.  Patient presents today for follow-up.  He denies any recent chest discomfort.  He is recovering well from the recent stroke and currently resides in [place] and the nurses over there has been giving him his medications.  His blood pressure today is stable at 116/56.  I recommend switch to losartan to the lowest dose of Entresto 24-26 mg twice a day.  We will obtain CBC and a basic metabolic panel today.  He has also been instructed to obtain a basic metabolic panel at nursing facility in 2 weeks and to forward [place] the result.  He will need a 30-day monitor to rule out atrial fibrillation given the recent stroke.  I plan to see the patient back in 4 to 6 weeks, at which time we may decide on the need outpatient nuclear stress test if he is still doing well.  Past Medical History:  Diagnosis Date   Anemia    Iron Defficiency   Cardiomyopathy ([place])    Combined systolic and diastolic congestive heart failure (HCC)    Diabetes mellitus without complication ([place])    TYPE 2   Foley catheter in place    SINCE NOV 2017   H/O cocaine abuse ([place])    Hyperlipidemia  Hypertension    Hypothyroidism    Liver abscess    measuring 7 x 9 cm/notes 03/04/2013   Stroke ([place]) 2010   WEAK ON BOTH SIDES NOW TOTAL OF 3 STROKES    Urinary retention     Past Surgical History:  Procedure Laterality Date   APPENDECTOMY     BUBBLE STUDY  04/14/2021   Procedure: BUBBLE STUDY;  Surgeon: [name], MD;  Location: [facility] ENDOSCOPY;  Service: Cardiology;;   COLONOSCOPY N/A 03/11/2013   Procedure: COLONOSCOPY;  Surgeon: Beryle Beams, MD;  Location: [place];  Service: Endoscopy;  Laterality: N/A;   COLONOSCOPY WITH PROPOFOL N/A 03/15/2018    Procedure: COLONOSCOPY WITH PROPOFOL;  Surgeon: Carol Ada, MD;  Location: [facility] ENDOSCOPY;  Service: Endoscopy;  Laterality: N/A;   CYSTOSCOPY WITH INSERTION OF UROLIFT N/A 01/04/2017   Procedure: CYSTOSCOPY WITH INSERTION OF UROLIFT x8;  Surgeon: Cleon Gustin, MD;  Location: [facility] ORS;  Service: Urology;  Laterality: N/A;   ESOPHAGOGASTRODUODENOSCOPY N/A 03/11/2013   Procedure: ESOPHAGOGASTRODUODENOSCOPY (EGD);  Surgeon: Beryle Beams, MD;  Location: [facility] ENDOSCOPY;  Service: Endoscopy;  Laterality: N/A;   GIVENS CAPSULE STUDY N/A 03/12/2013   Procedure: GIVENS CAPSULE STUDY;  Surgeon: Beryle Beams, MD;  Location: [place];  Service: Endoscopy;  Laterality: N/A;   INGUINAL HERNIA REPAIR Right    IR CT HEAD LTD  04/08/2021   IR PERCUTANEOUS ART THROMBECTOMY/INFUSION INTRACRANIAL INC DIAG ANGIO  04/08/2021   POLYPECTOMY  03/15/2018   Procedure: POLYPECTOMY;  Surgeon: Carol Ada, MD;  Location: [facility] ENDOSCOPY;  Service: Endoscopy;;   RADIOLOGY WITH ANESTHESIA N/A 04/08/2021   Procedure: IR WITH ANESTHESIA;  Surgeon: Radiologist, Medication, MD;  Location: [place];  Service: Radiology;  Laterality: N/A;   SCROTAL EXPLORATION Left 03/25/2021   Procedure: LEFT SCROTUM EXPLORATION WITH ORCHIECTOMY;  Surgeon: [name], MD;  Location: [facility];  Service: Urology;  Laterality: Left;   TEE WITHOUT CARDIOVERSION N/A 04/14/2021   Procedure: TRANSESOPHAGEAL ECHOCARDIOGRAM (TEE);  Surgeon: [name], MD;  Location: [place];  Service: Cardiology;  Laterality: N/A;   TIBIA FRACTURE SURGERY Left    TRANSURETHRAL RESECTION OF PROSTATE N/A 03/25/2021   Procedure: CYSTOSCOPY TRANSURETHRAL RESECTION OF THE PROSTATE (TURP);  Surgeon: [name], MD;  Location: [facility];  Service: Urology;  Laterality: N/A;    Current Medications: Current Meds  Medication Sig   acetaminophen (TYLENOL) 325 MG tablet Take 2 tablets (650 mg total) by mouth every 6 (six) hours as needed for mild pain (or Fever >/= 101).    aspirin EC 325 MG EC tablet Take 1 tablet (325 mg total) by mouth daily.   atorvastatin (LIPITOR) 20 MG tablet TAKE ONE TABLET BY MOUTH DAILY   canagliflozin (INVOKANA) 100 MG TABS tablet Take 1 tablet (100 mg total) by mouth daily before breakfast.   carvedilol (COREG) 3.125 MG tablet Take 3.125 mg by mouth 2 (two) times daily with a meal.   clopidogrel (PLAVIX) 75 MG tablet Take 75 mg by mouth daily at 12 noon.   finasteride (PROSCAR) 5 MG tablet Take 5 mg by mouth daily at 12 noon.   furosemide (LASIX) 20 MG tablet Take 1 tablet (20 mg total) by mouth every other day.   glucose blood (FREESTYLE LITE) test strip Use as instructed   glucose monitoring kit (FREESTYLE) monitoring kit 1 each by Does not apply route as needed for other. For blood sugar check   Lancets 28G MISC To check blood sugar three times daily  with meals and at bedtime   levothyroxine (SYNTHROID, LEVOTHROID) 100 MCG tablet Take 100 mcg by mouth daily before breakfast.    metFORMIN (GLUCOPHAGE) 500 MG tablet Take 500 mg by mouth daily with breakfast.    Multiple Vitamin (MULTIVITAMIN WITH MINERALS) TABS tablet Take 1 tablet by mouth daily.   oxyCODONE (OXY IR/ROXICODONE) 5 MG immediate release tablet Take 1 tablet (5 mg total) by mouth every 6 (six) hours as needed for severe pain.   polyethylene glycol (MIRALAX / GLYCOLAX) 17 g packet Take 17 g by mouth daily as needed for moderate constipation.   sacubitril-valsartan (ENTRESTO) 24-26 MG Take 1 tablet by mouth 2 (two) times daily.   spironolactone (ALDACTONE) 25 MG tablet Take 0.5 tablets (12.5 mg total) by mouth daily.   [DISCONTINUED] losartan (COZAAR) 25 MG tablet Take 1 tablet (25 mg total) by mouth daily.     Allergies:   Rocephin [ceftriaxone]   Social History   Socioeconomic History   Marital status: Widowed    Spouse name: Not on file   Number of children: Not on file   Years of education: Not on file   Highest education level: Not on file  Occupational  History   Not on file  Tobacco Use   Smoking status: Former    Packs/day: 1.00    Years: 51.00    Pack years: 51.00    Types: Cigarettes   Smokeless tobacco: Never   Tobacco comments:    QUIT NOV 2010  Vaping Use   Vaping Use: Never used  Substance and Sexual Activity   Alcohol use: No    Comment: NOV 2010   Drug use: Yes    Types: Cocaine    Comment: NOV 2010 LAST USED   Sexual activity: Not Currently  Other Topics Concern   Not on file  Social History Narrative   Not on file   Social Determinants of Health   Financial Resource Strain: Not on file  Food Insecurity: Not on file  Transportation Needs: Not on file  Physical Activity: Not on file  Stress: Not on file  Social Connections: Not on file     Family History: The patient's family history includes Diabetes type II in his father; Hypertension in his brother, father, and mother.  ROS:   Please see the history of present illness.     All other systems reviewed and are negative.  EKGs/Labs/Other Studies Reviewed:    The following studies were reviewed today:  TEE 04/14/2021  1. Left ventricular ejection fraction, by estimation, is 35%. The left  ventricle has severely decreased function.   2. Right ventricular systolic function is normal. The right ventricular  size is normal.   3. No left atrial/left atrial appendage thrombus was detected. The LAA  emptying velocity was 82 cm/s.   4. The mitral valve is normal in structure. Trivial mitral valve  regurgitation.   5. The aortic valve is normal in structure. Aortic valve regurgitation is  trivial. No aortic stenosis is present.   6. Aortic dilatation noted. There is borderline dilatation of the aortic  root, measuring 38 mm. There is mild (Grade II) atheroma plaque involving  the transverse and descending aorta.   7. Agitated saline contrast bubble study was negative, with no evidence  of any interatrial shunt.   Conclusion(s)/Recommendation(s): No LA/LAA  thrombus identified. Negative  bubble study for interatrial shunt. No intracardiac source of embolism  detected on this on this transesophageal echocardiogram.   EKG:  EKG  is ordered today.  The ekg ordered today demonstrates normal sinus rhythm, no significant ST-T wave changes  Recent Labs: 03/27/2021: Magnesium 1.9 04/08/2021: ALT 10; TSH 4.825 04/15/2021: BUN 21; Creatinine, Ser 1.00; Hemoglobin 9.0; Platelets 212; Potassium 4.6; Sodium 136  Recent Lipid Panel    Component Value Date/Time   CHOL 108 04/09/2021 0500   TRIG 183 (H) 04/09/2021 0500   HDL 18 (L) 04/09/2021 0500   CHOLHDL 6.0 04/09/2021 0500   VLDL 37 04/09/2021 0500   LDLCALC 53 04/09/2021 0500     Risk Assessment/Calculations:           Physical Exam:    VS:  BP (!) 116/56 (BP Location: Right Arm, Patient Position: Sitting, Cuff Size: Normal)   Pulse 86   Ht 5' 5" (1.651 m)   Wt 154 lb 6.4 oz (70 kg)   SpO2 98%   BMI 25.69 kg/m     Wt Readings from Last 3 Encounters:  04/22/21 154 lb 6.4 oz (70 kg)  04/08/21 165 lb 5.5 oz (75 kg)  03/30/21 156 lb 8.4 oz (71 kg)     GEN:  Well nourished, well developed in no acute distress HEENT: Normal NECK: No JVD; No carotid bruits LYMPHATICS: No lymphadenopathy CARDIAC: RRR, no murmurs, rubs, gallops RESPIRATORY:  Clear to auscultation without rales, wheezing or rhonchi  ABDOMEN: Soft, non-tender, non-distended MUSCULOSKELETAL:  No edema; No deformity  SKIN: Warm and dry NEUROLOGIC:  Alert and oriented x 3 PSYCHIATRIC:  Normal affect   ASSESSMENT:    1. Chronic combined systolic and diastolic heart failure ([place])   2. Medication management   3. Anemia, unspecified type   4. Cerebrovascular accident (CVA), unspecified mechanism ([place])   5. Primary hypertension   6. Hyperlipidemia LDL goal <70   7. Hypothyroidism, unspecified type    PLAN:    In order of problems listed above:  Chronic combined systolic diastolic heart failure: Euvolemic on exam.   Continue carvedilol, will switch the patient from losartan to Entresto 24-26 mg twice a day.  Obtain basic metabolic panel today and also in 1 week.  Rounding cardiologist in the hospital recommend outpatient Myoview once the patient recovers.  We will consider Myoview on the next follow-up.  Anemia: Obtain CBC  Recent CVA: TEE was negative for thrombus.  Will order cardiac monitor.  Hypertension: Blood pressure stable.  We will switch losartan to Entresto  Hyperlipidemia: On Lipitor  Hypothyroidism: Managed by primary care provider.        Medication Adjustments/Labs and Tests Ordered: Current medicines are reviewed at length with the patient today.  Concerns regarding medicines are outlined above.  Orders Placed This Encounter  Procedures   Basic metabolic panel   Basic metabolic panel   CBC   CARDIAC EVENT MONITOR   EKG 12-Lead   Meds ordered this encounter  Medications   sacubitril-valsartan (ENTRESTO) 24-26 MG    Sig: Take 1 tablet by mouth 2 (two) times daily.    Dispense:  180 tablet    Refill:  3    Patient Instructions  Medication Instructions:  STOP Losartan  START Entresto 24-26 mg 2 times a day   *If you need a refill on your cardiac medications before your next appointment, please call your pharmacy*  Lab Work: Your physician recommends that you return for lab work TODAY:  BMET CBC  Your physician recommends that you return for lab work in 2 WEEKS:  BMET If you have labs (blood work) drawn today and  your tests are completely normal, you will receive your results only by: MyChart Message (if you have MyChart) OR A paper copy in the mail If you have any lab test that is abnormal or we need to change your treatment, we will call you to review the results.  Testing/Procedures:  [employer] Cardiac Event Monitor Instructions Your physician has requested you wear your cardiac event monitor for 30 days, (1-30). [employer] may call or text to confirm a  shipping address. The monitor will be sent to a land address via UPS. [employer] will not ship a monitor to a PO BOX. It typically takes 3-5 days to receive your monitor after it has been enrolled. [employer] will assist with USPS tracking if your package is delayed. The telephone number for [employer] is [phone]. Once you have received your monitor, please review the enclosed instructions. Instruction tutorials can also be viewed under help and settings on the enclosed cell phone. Your monitor has already been registered assigning a specific monitor serial # to you.  Applying the monitor Remove cell phone from case and turn it on. The cell phone works as Dealer and needs to be within [employer] of you at all times. The cell phone will need to be charged on a daily basis. We recommend you plug the cell phone into the enclosed charger at your bedside table every night.  Monitor batteries: You will receive two monitor batteries labelled #1 and #2. These are your recorders. Plug battery #2 onto the second connection on the enclosed charger. Keep one battery on the charger at all times. This will keep the monitor battery deactivated. It will also keep it fully charged for when you need to switch your monitor batteries. A small light will be blinking on the battery emblem when it is charging. The light on the battery emblem will remain on when the battery is fully charged.  Open package of a Monitor strip. Insert battery #1 into black hood on strip and gently squeeze monitor battery onto connection as indicated in instruction booklet. Set aside while preparing skin.  Choose location for your strip, vertical or horizontal, as indicated in the instruction booklet. Shave to remove all hair from location. There cannot be any lotions, oils, powders, or colognes on skin where monitor is to be applied. Wipe skin clean with enclosed Saline wipe. Dry skin completely.  Peel paper labeled #1  off the back of the Monitor strip exposing the adhesive. Place the monitor on the chest in the vertical or horizontal position shown in the instruction booklet. One arrow on the monitor strip must be pointing upward. Carefully remove paper labeled #2, attaching remainder of strip to your skin. Try not to create any folds or wrinkles in the strip as you apply it.  Firmly press and release the circle in the center of the monitor battery. You will hear a small beep. This is turning the monitor battery on. The heart emblem on the monitor battery will light up every 5 seconds if the monitor battery in turned on and connected to the patient securely. Do not push and hold the circle down as this turns the monitor battery off. The cell phone will locate the monitor battery. A screen will appear on the cell phone checking the connection of your monitor strip. This may read poor connection initially but change to good connection within the next minute. Once your monitor accepts the connection you will hear a series of 3 beeps followed by a climbing  crescendo of beeps. A screen will appear on the cell phone showing the two monitor strip placement options. Touch the picture that demonstrates where you applied the monitor strip.  Your monitor strip and battery are waterproof. You are able to shower, bathe, or swim with the monitor on. They just ask you do not submerge deeper than 3 feet underwater. We recommend removing the monitor if you are swimming in a lake, river, or ocean.  Your monitor battery will need to be switched to a fully charged monitor battery approximately once a week. The cell phone will alert you of an action which needs to be made.  On the cell phone, tap for details to reveal connection status, monitor battery status, and cell phone battery status. The green dots indicates your monitor is in good status. A red dot indicates there is something that needs your attention.  To record a  symptom, click the circle on the monitor battery. In 30-60 seconds a list of symptoms will appear on the cell phone. Select your symptom and tap save. Your monitor will record a sustained or significant arrhythmia regardless of you clicking the button. Some patients do not feel the heart rhythm irregularities. [employer] will notify us of any serious or critical events.  Refer to instruction booklet for instructions on switching batteries, changing strips, the Do not disturb or Pause features, or any additional questions.  Call [employer] at [phone], to confirm your monitor is transmitting and record your baseline. They will answer any questions you may have regarding the monitor instructions at that time.  Returning the monitor to [place] all equipment back into blue box. Peel off strip of paper to expose adhesive and close box securely. There is a prepaid UPS shipping label on this box. Drop in a UPS drop box, or at a UPS facility like [employer]. You may also contact [employer] to arrange UPS to pick up monitor package at your home.  Follow-Up: At [facility], you and your health needs are our priority.  As part of our continuing mission to provide you with exceptional heart care, we have created designated Provider Care Teams.  These Care Teams include your primary Cardiologist (physician) and Advanced Practice Providers (APPs -  Physician Assistants and Nurse Practitioners) who all work together to provide you with the care you need, when you need it.  We recommend signing up for the patient portal called "MyChart".  Sign up information is provided on this After Visit Summary.  MyChart is used to connect with patients for Virtual Visits (Telemedicine).  Patients are able to view lab/test results, encounter notes, upcoming appointments, etc.  Non-urgent messages can be sent to your provider as well.   To learn more about what you can do with MyChart, go to  [email or web address].    Your next appointment:   6 week(s)  The format for your next appointment:   In Person  Provider:   Almyra Deforest, PA-C  Other Instructions    Signed, Almyra Deforest, [place]  04/24/2021 10:41 PM    [place]

## 2021-04-22 NOTE — Patient Instructions (Signed)
Medication Instructions:  STOP Losartan  START Entresto 24-26 mg 2 times a day   *If you need a refill on your cardiac medications before your next appointment, please call your pharmacy*  Lab Work: Your physician recommends that you return for lab work TODAY:  BMET CBC  Your physician recommends that you return for lab work in 2 WEEKS:  BMET If you have labs (blood work) drawn today and your tests are completely normal, you will receive your results only by: [employer] (if you have [place]) OR A paper copy in the mail If you have any lab test that is abnormal or we need to change your treatment, we will call you to review the results.  Testing/Procedures:  [employer] Cardiac Event Monitor Instructions Your physician has requested you wear your cardiac event monitor for 30 days, (1-30). [employer] may call or text to confirm a shipping address. The monitor will be sent to a land address via UPS. Preventice will not ship a monitor to a PO BOX. It typically takes 3-5 days to receive your monitor after it has been enrolled. [employer]ice will assist with USPS tracking if your package is delayed. The telephone number for [employer] is [phone]. Once you have received your monitor, please review the enclosed instructions. Instruction tutorials can also be viewed under help and settings on the enclosed cell phone. Your monitor has already been registered assigning a specific monitor serial # to you.  Applying the monitor Remove cell phone from case and turn it on. The cell phone works as Dealer and needs to be within [employer] of you at all times. The cell phone will need to be charged on a daily basis. We recommend you plug the cell phone into the enclosed charger at your bedside table every night.  Monitor batteries: You will receive two monitor batteries labelled #1 and #2. These are your recorders. Plug battery #2 onto the second connection on the enclosed charger.  Keep one battery on the charger at all times. This will keep the monitor battery deactivated. It will also keep it fully charged for when you need to switch your monitor batteries. A small light will be blinking on the battery emblem when it is charging. The light on the battery emblem will remain on when the battery is fully charged.  Open package of a Monitor strip. Insert battery #1 into black hood on strip and gently squeeze monitor battery onto connection as indicated in instruction booklet. Set aside while preparing skin.  Choose location for your strip, vertical or horizontal, as indicated in the instruction booklet. Shave to remove all hair from location. There cannot be any lotions, oils, powders, or colognes on skin where monitor is to be applied. Wipe skin clean with enclosed Saline wipe. Dry skin completely.  Peel paper labeled #1 off the back of the Monitor strip exposing the adhesive. Place the monitor on the chest in the vertical or horizontal position shown in the instruction booklet. One arrow on the monitor strip must be pointing upward. Carefully remove paper labeled #2, attaching remainder of strip to your skin. Try not to create any folds or wrinkles in the strip as you apply it.  Firmly press and release the circle in the center of the monitor battery. You will hear a small beep. This is turning the monitor battery on. The heart emblem on the monitor battery will light up every 5 seconds if the monitor battery in turned on and connected to the patient  securely. Do not push and hold the circle down as this turns the monitor battery off. The cell phone will locate the monitor battery. A screen will appear on the cell phone checking the connection of your monitor strip. This may read poor connection initially but change to good connection within the next minute. Once your monitor accepts the connection you will hear a series of 3 beeps followed by a climbing crescendo of  beeps. A screen will appear on the cell phone showing the two monitor strip placement options. Touch the picture that demonstrates where you applied the monitor strip.  Your monitor strip and battery are waterproof. You are able to shower, bathe, or swim with the monitor on. They just ask you do not submerge deeper than 3 feet underwater. We recommend removing the monitor if you are swimming in a lake, river, or ocean.  Your monitor battery will need to be switched to a fully charged monitor battery approximately once a week. The cell phone will alert you of an action which needs to be made.  On the cell phone, tap for details to reveal connection status, monitor battery status, and cell phone battery status. The green dots indicates your monitor is in good status. A red dot indicates there is something that needs your attention.  To record a symptom, click the circle on the monitor battery. In 30-60 seconds a list of symptoms will appear on the cell phone. Select your symptom and tap save. Your monitor will record a sustained or significant arrhythmia regardless of you clicking the button. Some patients do not feel the heart rhythm irregularities. [employer] will notify us of any serious or critical events.  Refer to instruction booklet for instructions on switching batteries, changing strips, the Do not disturb or Pause features, or any additional questions.  Call [employer] at [phone], to confirm your monitor is transmitting and record your baseline. They will answer any questions you may have regarding the monitor instructions at that time.  Returning the monitor to [place] all equipment back into blue box. Peel off strip of paper to expose adhesive and close box securely. There is a prepaid UPS shipping label on this box. Drop in a UPS drop box, or at a UPS facility like [employer]. You may also contact [employer] to arrange UPS to pick up monitor package at your  home.  Follow-Up: At [facility], you and your health needs are our priority.  As part of our continuing mission to provide you with exceptional heart care, we have created designated Provider Care Teams.  These Care Teams include your primary Cardiologist (physician) and Advanced Practice Providers (APPs -  Physician Assistants and Nurse Practitioners) who all work together to provide you with the care you need, when you need it.  We recommend signing up for the patient portal called "MyChart".  Sign up information is provided on this After Visit Summary.  MyChart is used to connect with patients for Virtual Visits (Telemedicine).  Patients are able to view lab/test results, encounter notes, upcoming appointments, etc.  Non-urgent messages can be sent to your provider as well.   To learn more about what you can do with MyChart, go to [email or web address].    Your next appointment:   6 week(s)  The format for your next appointment:   In Person  Provider:   Almyra Deforest, PA-C  Other Instructions

## 2021-04-22 NOTE — Telephone Encounter (Signed)
Returned call to [place] at [facility]. [place] was calling to get Fax number to office to have patient's lab results faxed to the office in 10 days.  Fax number provided. Advised [place] to call back with any issues, or concerns. [place] verbalized understanding.

## 2021-04-22 NOTE — Telephone Encounter (Signed)
Pt was seen by Almyra Deforest today and was instructed to have his labs in 2 weeks. [place] wants to know if she can draw the pt's lab work and fax the results to our office? Please advise

## 2021-04-24 ENCOUNTER — Encounter: Payer: Self-pay | Admitting: Physician Assistant

## 2021-04-26 ENCOUNTER — Encounter: Payer: Self-pay | Admitting: *Deleted

## 2021-04-26 NOTE — Progress Notes (Signed)
Patient ID: Brad Harrell, male   DOB: [birth year], 75 y.o.   MRN: [id number] Patient enrolled for [employer] to ship a 30 day cardiac event monitor to [facility], [address] [place], [place],   [address] , Attention Director of [place]  [phone]. Letter with instructions mail to same address.

## 2021-05-03 ENCOUNTER — Ambulatory Visit (INDEPENDENT_AMBULATORY_CARE_PROVIDER_SITE_OTHER): Payer: Medicare Other

## 2021-05-03 DIAGNOSIS — I4891 Unspecified atrial fibrillation: Secondary | ICD-10-CM | POA: Diagnosis not present

## 2021-05-03 DIAGNOSIS — I429 Cardiomyopathy, unspecified: Secondary | ICD-10-CM | POA: Diagnosis not present

## 2021-05-03 DIAGNOSIS — I639 Cerebral infarction, unspecified: Secondary | ICD-10-CM

## 2021-05-03 DIAGNOSIS — I5042 Chronic combined systolic (congestive) and diastolic (congestive) heart failure: Secondary | ICD-10-CM

## 2021-05-10 ENCOUNTER — Telehealth: Payer: Self-pay

## 2021-05-10 NOTE — Telephone Encounter (Signed)
Called patient left message on personal voice mail to call office.We received a fax from [facility] saying they have not received any transmissions in the past 2 days.

## 2021-05-23 ENCOUNTER — Telehealth: Payer: Self-pay | Admitting: Cardiology

## 2021-05-23 NOTE — Telephone Encounter (Signed)
Lurline Idol is calling to inform Dr. Lysbeth Penner nurse the pt is refusing to wear the heart monitor and is going to be discharged from their facility tomorrow. Requesting a callback with confirmation that this is okay.

## 2021-05-23 NOTE — Telephone Encounter (Signed)
Reached out to [facility], PA-C to ask how to handle this matter. Per Isaac Laud he asked that I inform the nurse at the facility that the monitor was order to monitor for stroke per neurology and if the patient wants to he can wear it home and mail it back from there. Return call to [facility] informed her of Hao's recommendation. She stated that she thought Mr. Zavada's daughter removed the monitor over the weekend while visiting but later found out that it was the patient himself removing the monitor stating that it did not work and he wanted it off. Lurline Idol stated that the monitor was working when she applied to patient. I also informed her that the monitor can be mailed back and what data that may have been collected will be analyzed. She thanked me for calling back and she will update his chart with the information and pass it along to the Director of Nursing (DON).

## 2021-05-23 NOTE — Telephone Encounter (Signed)
Attempted to call but was left on hold. Will forward to PA to make aware.

## 2021-05-26 ENCOUNTER — Other Ambulatory Visit: Payer: Self-pay | Admitting: Physician Assistant

## 2021-05-26 DIAGNOSIS — I639 Cerebral infarction, unspecified: Secondary | ICD-10-CM

## 2021-05-26 DIAGNOSIS — I429 Cardiomyopathy, unspecified: Secondary | ICD-10-CM

## 2021-05-26 DIAGNOSIS — I4891 Unspecified atrial fibrillation: Secondary | ICD-10-CM

## 2021-05-26 DIAGNOSIS — I5042 Chronic combined systolic (congestive) and diastolic (congestive) heart failure: Secondary | ICD-10-CM

## 2021-05-27 ENCOUNTER — Telehealth: Payer: Self-pay | Admitting: *Deleted

## 2021-05-27 NOTE — Telephone Encounter (Addendum)
-----   Message from Sanda Klein, MD sent at 05/27/2021  8:29 AM EDT ----- Only less than 3 days of the 3 weeks were recorded. No afib on the limited recording.  Spoke with pt daughter, she reports he was at the facility and is not sure what happened but he did wear it longer. Aware no atrial fib on what we received.

## 2021-06-01 ENCOUNTER — Other Ambulatory Visit: Payer: Self-pay

## 2021-06-01 ENCOUNTER — Encounter: Payer: Self-pay | Admitting: Adult Health

## 2021-06-01 ENCOUNTER — Telehealth: Payer: Self-pay | Admitting: Physician Assistant

## 2021-06-01 ENCOUNTER — Ambulatory Visit (INDEPENDENT_AMBULATORY_CARE_PROVIDER_SITE_OTHER): Payer: Medicare Other | Admitting: Adult Health

## 2021-06-01 VITALS — BP 103/62 | HR 92 | Ht 63.0 in | Wt 162.0 lb

## 2021-06-01 DIAGNOSIS — E785 Hyperlipidemia, unspecified: Secondary | ICD-10-CM

## 2021-06-01 DIAGNOSIS — E119 Type 2 diabetes mellitus without complications: Secondary | ICD-10-CM

## 2021-06-01 DIAGNOSIS — I10 Essential (primary) hypertension: Secondary | ICD-10-CM | POA: Diagnosis not present

## 2021-06-01 DIAGNOSIS — I63412 Cerebral infarction due to embolism of left middle cerebral artery: Secondary | ICD-10-CM | POA: Diagnosis not present

## 2021-06-01 DIAGNOSIS — I5042 Chronic combined systolic (congestive) and diastolic (congestive) heart failure: Secondary | ICD-10-CM

## 2021-06-01 DIAGNOSIS — I63512 Cerebral infarction due to unspecified occlusion or stenosis of left middle cerebral artery: Secondary | ICD-10-CM

## 2021-06-01 NOTE — Progress Notes (Signed)
Placed order for 30 day event monitor per Almyra Deforest, PA-C

## 2021-06-01 NOTE — Telephone Encounter (Signed)
I spoke with the patient's daughter.  He had a 30-day monitor ordered in the hospital due to recent stroke to rule out atrial fibrillation.  Unfortunately after the patient was sent to the nursing home, he only wore the monitor for 3 days.  Daughter says the patient was confused with instruction and battery was dead during 82 of the work time.  Unfortunately 3 days of recording likely will not capture any atrial fibrillation.  Given the significant neurological event, we agreed to proceed with a another 30-day monitor.  Patient has been discharged from the nursing home and daughter will help assist him to be more compliant with the new heart monitor.

## 2021-06-01 NOTE — Progress Notes (Signed)
[facility] [address] [place]. [place] [address] [phone]       HOSPITAL FOLLOW UP NOTE  [name] Date of Birth:  Nov 30, 1945 Medical Record Number:  [id number]   Reason for Referral:  hospital stroke follow up    SUBJECTIVE:   CHIEF COMPLAINT:  Chief Complaint  Patient presents with   Follow-up    RM 3 with  Pt was at rehab and just came home with daughter. She states she notice L side weakness    HPI:   Brad Harrell is a 75 yo male with PMHx of HTN, HLD, DM, prior strokes, hypothyroidism, chronic systolic and diastolic HF (dx'd 03/6598), cardiomyopathy, history of cocaine and tobacco use, s/p TURP and orchiectomy due to orchitis and pyocele on 03/25/2021 - d/c'd from [facility] to SNF on 03/30/2021. Presented via EMS to the ED for AMS on 04/08/2021. After bedding the patient in the ED, staff noted global aphasia with right sided hemiparesis and a code stroke was called.  Personally reviewed hospitalization pertinent progress notes, lab work and imaging summary provided. NIHSS = 17. 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  TEE EF 35% without evidence of thrombus or PFO.  LDL 53.  A1c 5.3.  Recommended DAPT for 3 months and aspirin alone.  Plans for further cardiology work-up outpatient for ischemic evaluation and possible ICD device due to reduced LVEF.  PT/OT/SLP recommended returning to SNF.  Today, 06/01/2021, Brad Harrell is being seen for hospital follow up accompanied by his daughter who provides majority of history. Returned home with daughter last week -received therapies at facility and started [facility] therapies yesterday. Ambulating with RW - WC long distance. Residual right sided weakness and some word finding difficulty.  Able to maintain ADLs with only minimal assistance.  Denies  new stroke/TIA symptoms.  Compliant on aspirin and Plavix as well as atorvastatin without associated side effects.  Blood pressure today 103/62 - routinely monitors at home and has been stable.  Routinely follows with cardiology and completed cardiac monitor which did not show evidence of A. fib.  No concerns at this time.      ROS:   14 system review of systems performed and negative with exception of those listed in HPI  PMH:  Past Medical History:  Diagnosis Date   Anemia    Iron Defficiency   Cardiomyopathy ([place])    Combined systolic and diastolic congestive heart failure (HCC)    Diabetes mellitus without complication ([place])    TYPE 2   Foley catheter in place    SINCE NOV 2017   H/O cocaine abuse ([place])    Hyperlipidemia    Hypertension    Hypothyroidism    Liver abscess    measuring 7 x 9 cm/notes 03/04/2013   Stroke ([place]) 2010   WEAK ON BOTH SIDES NOW TOTAL OF 3 STROKES    Urinary retention     PSH:  Past Surgical History:  Procedure Laterality Date   APPENDECTOMY     BUBBLE STUDY  04/14/2021   Procedure: BUBBLE STUDY;  Surgeon: [name], MD;  Location: [facility] ENDOSCOPY;  Service: Cardiology;;   COLONOSCOPY N/A 03/11/2013   Procedure: COLONOSCOPY;  Surgeon: Beryle Beams, MD;  Location: [facility];  Service: Endoscopy;  Laterality: N/A;   COLONOSCOPY WITH PROPOFOL  N/A 03/15/2018   Procedure: COLONOSCOPY WITH PROPOFOL;  Surgeon: Carol Ada, MD;  Location: [facility] ENDOSCOPY;  Service: Endoscopy;  Laterality: N/A;   CYSTOSCOPY WITH INSERTION OF UROLIFT N/A 01/04/2017   Procedure: CYSTOSCOPY WITH INSERTION OF UROLIFT x8;  Surgeon: Cleon Gustin, MD;  Location: [facility] ORS;  Service: Urology;  Laterality: N/A;   ESOPHAGOGASTRODUODENOSCOPY N/A 03/11/2013   Procedure: ESOPHAGOGASTRODUODENOSCOPY (EGD);  Surgeon: Beryle Beams, MD;  Location: [facility] ENDOSCOPY;  Service: Endoscopy;  Laterality: N/A;   GIVENS CAPSULE STUDY N/A 03/12/2013   Procedure: GIVENS CAPSULE STUDY;   Surgeon: Beryle Beams, MD;  Location: [facility];  Service: Endoscopy;  Laterality: N/A;   INGUINAL HERNIA REPAIR Right    IR CT HEAD LTD  04/08/2021   IR PERCUTANEOUS ART THROMBECTOMY/INFUSION INTRACRANIAL INC DIAG ANGIO  04/08/2021   POLYPECTOMY  03/15/2018   Procedure: POLYPECTOMY;  Surgeon: Carol Ada, MD;  Location: [facility] ENDOSCOPY;  Service: Endoscopy;;   RADIOLOGY WITH ANESTHESIA N/A 04/08/2021   Procedure: IR WITH ANESTHESIA;  Surgeon: Radiologist, Medication, MD;  Location: [facility];  Service: Radiology;  Laterality: N/A;   SCROTAL EXPLORATION Left 03/25/2021   Procedure: LEFT SCROTUM EXPLORATION WITH ORCHIECTOMY;  Surgeon: [name], MD;  Location: [facility] ORS;  Service: Urology;  Laterality: Left;   TEE WITHOUT CARDIOVERSION N/A 04/14/2021   Procedure: TRANSESOPHAGEAL ECHOCARDIOGRAM (TEE);  Surgeon: [name], MD;  Location: [facility];  Service: Cardiology;  Laterality: N/A;   TIBIA FRACTURE SURGERY Left    TRANSURETHRAL RESECTION OF PROSTATE N/A 03/25/2021   Procedure: CYSTOSCOPY TRANSURETHRAL RESECTION OF THE PROSTATE (TURP);  Surgeon: [name], MD;  Location: [facility] ORS;  Service: Urology;  Laterality: N/A;    Social History:  Social History   Socioeconomic History   Marital status: Widowed    Spouse name: Not on file   Number of children: Not on file   Years of education: Not on file   Highest education level: Not on file  Occupational History   Not on file  Tobacco Use   Smoking status: Former    Packs/day: 1.00    Years: 51.00    Pack years: 51.00    Types: Cigarettes   Smokeless tobacco: Never   Tobacco comments:    QUIT NOV 2010  Vaping Use   Vaping Use: Never used  Substance and Sexual Activity   Alcohol use: No    Comment: NOV 2010   Drug use: Yes    Types: Cocaine    Comment: NOV 2010 LAST USED   Sexual activity: Not Currently  Other Topics Concern   Not on file  Social History Narrative   Not on file   Social Determinants of Health    Financial Resource Strain: Not on file  Food Insecurity: Not on file  Transportation Needs: Not on file  Physical Activity: Not on file  Stress: Not on file  Social Connections: Not on file  Intimate Partner Violence: Not on file    Family History:  Family History  Problem Relation Age of Onset   Hypertension Mother    Hypertension Father    Diabetes type II Father    Hypertension Brother     Medications:   Current Outpatient Medications on File Prior to Visit  Medication Sig Dispense Refill   aspirin EC 325 MG EC tablet Take 1 tablet (325 mg total) by mouth daily. 30 tablet 0   atorvastatin (LIPITOR) 20 MG tablet TAKE ONE TABLET BY MOUTH DAILY 30 tablet 0   carvedilol (  COREG) 3.125 MG tablet Take 3.125 mg by mouth 2 (two) times daily with a meal.     clopidogrel (PLAVIX) 75 MG tablet Take 75 mg by mouth daily at 12 noon.     ergocalciferol (VITAMIN D2) 1.25 MG (50000 UT) capsule Take 50,000 Units by mouth once a week.     ertugliflozin L-PyroglutamicAc (STEGLATRO) 5 MG TABS tablet Take 5 mg by mouth daily.     finasteride (PROSCAR) 5 MG tablet Take 5 mg by mouth daily at 12 noon.     furosemide (LASIX) 20 MG tablet Take 1 tablet (20 mg total) by mouth every other day. 15 tablet 0   glucose blood (FREESTYLE LITE) test strip Use as instructed 100 each 12   glucose monitoring kit (FREESTYLE) monitoring kit 1 each by Does not apply route as needed for other. For blood sugar check 1 each 0   Lancets 28G MISC To check blood sugar three times daily with meals and at bedtime 120 each 3   levothyroxine (SYNTHROID, LEVOTHROID) 100 MCG tablet Take 100 mcg by mouth daily before breakfast.      metFORMIN (GLUCOPHAGE) 500 MG tablet Take 500 mg by mouth daily with breakfast.      sacubitril-valsartan (ENTRESTO) 24-26 MG Take 1 tablet by mouth 2 (two) times daily.     spironolactone (ALDACTONE) 25 MG tablet Take 0.5 tablets (12.5 mg total) by mouth daily. 45 tablet 3   No current  facility-administered medications on file prior to visit.    Allergies:   Allergies  Allergen Reactions   Rocephin [Ceftriaxone] Nausea And Vomiting      OBJECTIVE:  Physical Exam  Vitals:   06/01/21 1412  BP: 103/62  Pulse: 92  Weight: 162 lb (73.5 kg)  Height: 5' 3"  (1.6 m)   Body mass index is 28.7 kg/m. No results found.  Post stroke PHQ 2/9 Depression screen PHQ 2/9 06/01/2021  Decreased Interest 0  Down, Depressed, Hopeless 0  PHQ - 2 Score 0     General: well developed, well nourished, very pleasant elderly African-American male seated, in no evident distress Head: head normocephalic and atraumatic.   Neck: supple with no carotid or supraclavicular bruits Cardiovascular: regular rate and rhythm, no murmurs Musculoskeletal: no deformity Skin:  no rash/petichiae Vascular:  Normal pulses all extremities   Neurologic Exam Mental Status: Awake and fully alert.  Mild dysarthria (possibly due to poor denture) and mild word finding difficulty although did not actively participate in majority of conversation.  Disoriented to place, year, month and age. Recent memory impaired and remote memory intact. Attention span, concentration and fund of knowledge impaired. Mood and affect appropriate.  Cranial Nerves: Chronic OD blindness.  Extraocular movements full without nystagmus L>R. OD visual fields full to confrontation. Hearing intact. Facial sensation intact. Face, tongue, palate moves normally and symmetrically.  Motor: Normal bulk and tone. Normal strength in all tested extremity muscles except slight right hand grip strength Sensory.: intact to touch , pinprick , position and vibratory sensation.  Coordination: Rapid alternating movements normal in all extremities decreased right hand dexterity. Finger-to-nose and heel-to-shin performed accurately bilaterally.  Orbits left arm over right arm. Gait and Station: Deferred- RW not present Reflexes: 1+ and symmetric. Toes  downgoing.     NIHSS  2 Modified Rankin  3      ASSESSMENT: Brad Harrell is a 75 y.o. 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 Vascular risk factors include HTN, HLD, DM, hx of prior strokes with one resulting in OD blindness, hx of cocaine and tobacco abuse and chronic systolic HF (dx'd 01/8269), .      PLAN:  Left MCA stroke:  Residual deficit: Mild right hemiparesis, mild cognitive impairment and aphasia.  Continue therapies and potentially transition to outpatient therapies once home health completed  Cardiac monitor negative for atrial fibrillation continue aspirin 81 mg daily and clopidogrel 75 mg daily  and atorvastatin for secondary stroke prevention.  Discussed use of DAPT for total of 3 months and aspirin alone as prolonged DAPT not indicated from stroke standpoint but advised to further discuss this with cardiology prior to discontinuing Discussed secondary stroke prevention measures and importance of close PCP follow up for aggressive stroke risk factor management. I have gone over the pathophysiology of stroke, warning signs and symptoms, risk factors and their management in some detail with instructions to go to the closest emergency room for symptoms of concern. HTN: BP goal <130/90.  Stable on current regimen per PCP HLD: LDL goal <70. Recent LDL 53 on atorvastatin 20 mg daily per PCP.  DMII: A1c goal<7.0. Recent A1c 5.3 on metformin per PCP.  Chronic systolic HF: Continue medication regimen as advised by cardiology.  Plans to undergo Myoview after further stroke recovery. Has f/u visit 06/10/2021    Follow up in 4 months or call earlier if needed   CC:  [facility] provider: Dr. Leonie Man PCP: Nolene Ebbs, MD    I spent 48 minutes of face-to-face and non-face-to-face time with patient and daughter.  This included previsit chart  review including recent hospitalization, lab review, study review, electronic health record documentation, patient and daughter education regarding recent stroke, potential etiologies, secondary stroke prevention measures and importance of managing stroke risk factors as noted above, residual deficits and typical recovery time and answered all questions to patient and daughters satisfaction  Frann Rider, AGNP-BC  [facility] [address] [place], [place] [address]  Phone [phone] Fax [phone] Note: This document was prepared with digital dictation and possible smart phrase technology. Any transcriptional errors that result from this process are unintentional.

## 2021-06-01 NOTE — Patient Instructions (Signed)
Continue working with home health therapies for hopeful ongoing recovery  Continue aspirin 81 mg daily and clopidogrel 75 mg daily  and atorvastatin  for secondary stroke prevention  Ensure follow up with cardiology to discuss ongoing use of plavix after 3 month duration - prolonged use of plavix not indicated from stroke standpoint  Continue to follow up with PCP regarding cholesterol, blood pressure and diabetes management  Maintain strict control of hypertension with blood pressure goal below 130/90, diabetes with hemoglobin A1c goal below 7% and cholesterol with LDL cholesterol (bad cholesterol) goal below 70 mg/dL.       Followup in the future with me in 4 months or call earlier if needed       Thank you for coming to see [place] at [facility]. I hope we have been able to provide you high quality care today.  You may receive a patient satisfaction survey over the next few weeks. We would appreciate your feedback and comments so that we may continue to improve ourselves and the health of our patients.

## 2021-06-02 NOTE — Progress Notes (Signed)
I agree with the above plan 

## 2021-06-06 ENCOUNTER — Encounter: Payer: Self-pay | Admitting: *Deleted

## 2021-06-06 ENCOUNTER — Ambulatory Visit (INDEPENDENT_AMBULATORY_CARE_PROVIDER_SITE_OTHER): Payer: Medicare Other

## 2021-06-06 ENCOUNTER — Other Ambulatory Visit: Payer: Self-pay

## 2021-06-06 ENCOUNTER — Other Ambulatory Visit: Payer: Self-pay | Admitting: Physician Assistant

## 2021-06-06 DIAGNOSIS — I429 Cardiomyopathy, unspecified: Secondary | ICD-10-CM

## 2021-06-06 DIAGNOSIS — I639 Cerebral infarction, unspecified: Secondary | ICD-10-CM | POA: Diagnosis not present

## 2021-06-06 DIAGNOSIS — I5042 Chronic combined systolic (congestive) and diastolic (congestive) heart failure: Secondary | ICD-10-CM

## 2021-06-06 DIAGNOSIS — I63512 Cerebral infarction due to unspecified occlusion or stenosis of left middle cerebral artery: Secondary | ICD-10-CM

## 2021-06-06 DIAGNOSIS — I4891 Unspecified atrial fibrillation: Secondary | ICD-10-CM | POA: Diagnosis not present

## 2021-06-06 NOTE — Progress Notes (Unsigned)
Repeat [employer]ice 30 day cardiac event monitor from office inventory applied to patient.  Daughter [name] given tutorial. Original monitor not properly maintained during 30 day period while patient in skilled nursing facility resulting in cardiac monitoring of 2 days out of 30 possible.

## 2021-06-10 ENCOUNTER — Ambulatory Visit (INDEPENDENT_AMBULATORY_CARE_PROVIDER_SITE_OTHER): Payer: Medicare Other | Admitting: Physician Assistant

## 2021-06-10 ENCOUNTER — Other Ambulatory Visit: Payer: Self-pay

## 2021-06-10 VITALS — BP 101/63 | HR 86 | Ht 63.0 in | Wt 162.0 lb

## 2021-06-10 DIAGNOSIS — I63512 Cerebral infarction due to unspecified occlusion or stenosis of left middle cerebral artery: Secondary | ICD-10-CM

## 2021-06-10 DIAGNOSIS — I5042 Chronic combined systolic (congestive) and diastolic (congestive) heart failure: Secondary | ICD-10-CM

## 2021-06-10 DIAGNOSIS — E785 Hyperlipidemia, unspecified: Secondary | ICD-10-CM | POA: Diagnosis not present

## 2021-06-10 DIAGNOSIS — Z79899 Other long term (current) drug therapy: Secondary | ICD-10-CM

## 2021-06-10 DIAGNOSIS — Z8673 Personal history of transient ischemic attack (TIA), and cerebral infarction without residual deficits: Secondary | ICD-10-CM

## 2021-06-10 DIAGNOSIS — E119 Type 2 diabetes mellitus without complications: Secondary | ICD-10-CM

## 2021-06-10 DIAGNOSIS — I10 Essential (primary) hypertension: Secondary | ICD-10-CM

## 2021-06-10 NOTE — Progress Notes (Signed)
Cardiology Office Note:    Date:  06/12/2021   ID:  Brad Harrell, DOB [birth year], MRN [id number]  PCP:  Nolene Ebbs, MD   [facility] Providers Cardiologist:  Minus Breeding, MD     Referring MD: Nolene Ebbs, MD   Chief Complaint  Patient presents with   Follow-up    Seen for Dr. Percival Spanish    History of Present Illness:    Brad Harrell is a 75 y.o. male with a hx of chronic systolic and diastolic heart failure, hypertension, hyperlipidemia, DM 2, history of recurrent CVA x4, hypothyroidism, and tobacco use.  Patient was admitted in May 2022 for sepsis and left epididymoorchitis with abscess status post left scrotal exploration with left orchiectomy and drainage of pyelocele and a TURP.  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  Cardiology service was consulted due to his chronic combined heart failure and suspicion for cardioembolic CVA.  Previous echocardiogram obtained on 03/24/2021 showed EF 25 to 30%, significant LV dysfunction, mildly reduced RV systolic function, trivial MR, mild dilatation of ascending aorta measuring at 42 mm.  Etiology behind his previous cardiomyopathy is not clear, he had low EF since 2015.  He previously did not see any cardiologist nor take any heart failure medication.  During this admission, he was placed on appropriate heart failure medication and recommend outpatient follow-up with cardiology service.  Unfortunately, he went back to the hospital with recurrent CVA.  MRI of the brain confirmed acute cortical infarction of the left MCA distribution.  TTE with bubble study performed on 04/11/2021 showed EF 25 to 30%, severe LV dysfunction, LV global hypokinesis, grade 1 DD, mild LVH.  He was placed on 325 mg daily of aspirin and a 75 mg daily of Plavix by neurology service.  TEE performed on 04/14/2021 showed EF of 35%, no left atrial appendage thrombus, no apical thrombus.  Ischemic work-up was  deferred to outpatient.  Patient was placed on 20 mg Lasix every Monday Wednesday Friday and also [place].  He was also placed on losartan 25 mg daily with plan to start on Entresto as outpatient.  Outpatient 30-day heart monitor was also ordered as well.  I last saw the patient on 04/22/2021, his blood pressure was stable at the time.  I started him on lowest dose of Entresto 24-26 mg twice a day.  We ordered a heart monitor, however he refused to wear the heart monitor initially.  He has since returned home and his daughter is taking over his care.  After to calling his daughter and I discussed the issue, his daughter would prefer he wear another heart monitor to rule out atrial fibrillation.  Patient presents today for follow-up.  He is currently wearing another heart monitor to try to rule out atrial fibrillation.  Overall he is doing quite well at home.  He is due for a basic metabolic panel to check his renal function and electrolyte.  Systolic blood pressure is borderline low, I am unable to uptitrate heart failure medication any further.  Overall patient has been feeling very well.  I recommend a repeat echocardiogram in 2 months.  He will also need a nuclear stress test as well as recommended by cardiologist in the hospital.  Otherwise he can follow-up with Dr. Percival Spanish in 3 months.    Past Medical History:  Diagnosis Date   Anemia    Iron Defficiency   Cardiomyopathy ([place])    Combined systolic  and diastolic congestive heart failure (HCC)    Diabetes mellitus without complication ([place])    TYPE 2   Foley catheter in place    SINCE NOV 2017   H/O cocaine abuse ([place])    Hyperlipidemia    Hypertension    Hypothyroidism    Liver abscess    measuring 7 x 9 cm/notes 03/04/2013   Stroke ([place]) 2010   WEAK ON BOTH SIDES NOW TOTAL OF 3 STROKES    Urinary retention     Past Surgical History:  Procedure Laterality Date   APPENDECTOMY     BUBBLE STUDY  04/14/2021   Procedure: BUBBLE STUDY;   Surgeon: [name], MD;  Location: [facility] ENDOSCOPY;  Service: Cardiology;;   COLONOSCOPY N/A 03/11/2013   Procedure: COLONOSCOPY;  Surgeon: Beryle Beams, MD;  Location: [place];  Service: Endoscopy;  Laterality: N/A;   COLONOSCOPY WITH PROPOFOL N/A 03/15/2018   Procedure: COLONOSCOPY WITH PROPOFOL;  Surgeon: Carol Ada, MD;  Location: [facility] ENDOSCOPY;  Service: Endoscopy;  Laterality: N/A;   CYSTOSCOPY WITH INSERTION OF UROLIFT N/A 01/04/2017   Procedure: CYSTOSCOPY WITH INSERTION OF UROLIFT x8;  Surgeon: Cleon Gustin, MD;  Location: [facility] ORS;  Service: Urology;  Laterality: N/A;   ESOPHAGOGASTRODUODENOSCOPY N/A 03/11/2013   Procedure: ESOPHAGOGASTRODUODENOSCOPY (EGD);  Surgeon: Beryle Beams, MD;  [facility]COPY;  Service: Endoscopy;  Laterality: N/A;   GIVENS CAPSULE STUDY N/A 03/12/2013   Procedure: GIVENS CAPSULE STUDY;  Surgeon: Beryle Beams, MD;  Location: [place];  Service: Endoscopy;  Laterality: N/A;   INGUINAL HERNIA REPAIR Right    IR CT HEAD LTD  04/08/2021   IR PERCUTANEOUS ART THROMBECTOMY/INFUSION INTRACRANIAL INC DIAG ANGIO  04/08/2021   POLYPECTOMY  03/15/2018   Procedure: POLYPECTOMY;  Surgeon: Carol Ada, MD;  Location: [facility] ENDOSCOPY;  Service: Endoscopy;;   RADIOLOGY WITH ANESTHESIA N/A 04/08/2021   Procedure: IR WITH ANESTHESIA;  Surgeon: Radiologist, Medication, MD;  Location: [place];  Service: Radiology;  Laterality: N/A;   SCROTAL EXPLORATION Left 03/25/2021   Procedure: LEFT SCROTUM EXPLORATION WITH ORCHIECTOMY;  Surgeon: [name], MD;  Location: [facility];  Service: Urology;  Laterality: Left;   TEE WITHOUT CARDIOVERSION N/A 04/14/2021   Procedure: TRANSESOPHAGEAL ECHOCARDIOGRAM (TEE);  Surgeon: [name], MD;  Location: [place];  Service: Cardiology;  Laterality: N/A;   TIBIA FRACTURE SURGERY Left    TRANSURETHRAL RESECTION OF PROSTATE N/A 03/25/2021   Procedure: CYSTOSCOPY TRANSURETHRAL RESECTION OF THE PROSTATE (TURP);  Surgeon:  [name], MD;  Location: [facility];  Service: Urology;  Laterality: N/A;    Current Medications: Current Meds  Medication Sig   aspirin EC 325 MG EC tablet Take 1 tablet (325 mg total) by mouth daily.   atorvastatin (LIPITOR) 20 MG tablet TAKE ONE TABLET BY MOUTH DAILY   carvedilol (COREG) 3.125 MG tablet Take 3.125 mg by mouth 2 (two) times daily with a meal.   clopidogrel (PLAVIX) 75 MG tablet Take 75 mg by mouth daily at 12 noon.   ergocalciferol (VITAMIN D2) 1.25 MG (50000 UT) capsule Take 50,000 Units by mouth once a week.   ertugliflozin L-PyroglutamicAc (STEGLATRO) 5 MG TABS tablet Take 5 mg by mouth daily.   finasteride (PROSCAR) 5 MG tablet Take 5 mg by mouth daily at 12 noon.   glucose blood (FREESTYLE LITE) test strip Use as instructed   glucose monitoring kit (FREESTYLE) monitoring kit 1 each by Does not apply route as needed for other. For blood sugar check  Lancets 28G MISC To check blood sugar three times daily with meals and at bedtime   levothyroxine (SYNTHROID, LEVOTHROID) 100 MCG tablet Take 100 mcg by mouth daily before breakfast.    metFORMIN (GLUCOPHAGE) 500 MG tablet Take 500 mg by mouth daily with breakfast.    sacubitril-valsartan (ENTRESTO) 24-26 MG Take 1 tablet by mouth 2 (two) times daily.   spironolactone (ALDACTONE) 25 MG tablet Take 0.5 tablets (12.5 mg total) by mouth daily.     Allergies:   Rocephin [ceftriaxone]   Social History   Socioeconomic History   Marital status: Widowed    Spouse name: Not on file   Number of children: Not on file   Years of education: Not on file   Highest education level: Not on file  Occupational History   Not on file  Tobacco Use   Smoking status: Former    Packs/day: 1.00    Years: 51.00    Pack years: 51.00    Types: Cigarettes   Smokeless tobacco: Never   Tobacco comments:    QUIT NOV 2010  Vaping Use   Vaping Use: Never used  Substance and Sexual Activity   Alcohol use: No    Comment: NOV 2010   Drug  use: Yes    Types: Cocaine    Comment: NOV 2010 LAST USED   Sexual activity: Not Currently  Other Topics Concern   Not on file  Social History Narrative   Not on file   Social Determinants of Health   Financial Resource Strain: Not on file  Food Insecurity: Not on file  Transportation Needs: Not on file  Physical Activity: Not on file  Stress: Not on file  Social Connections: Not on file     Family History: The patient's family history includes Diabetes type II in his father; Hypertension in his brother, father, and mother.  ROS:   Please see the history of present illness.     All other systems reviewed and are negative.  EKGs/Labs/Other Studies Reviewed:    The following studies were reviewed today:  Echo 03/24/2021  1. Left ventricular ejection fraction, by estimation, is 25 to 30%. Left  ventricular ejection fraction by 2D MOD biplane is 26.6 %. The left  ventricle has severely decreased function. The left ventricle demonstrates  global hypokinesis. Indeterminate  diastolic filling due to E-A fusion.   2. Right ventricular systolic function is mildly reduced. The right  ventricular size is normal. Tricuspid regurgitation signal is inadequate  for assessing PA pressure.   3. The mitral valve is grossly normal. Trivial mitral valve  regurgitation. No evidence of mitral stenosis.   4. The aortic valve is tricuspid. Aortic valve regurgitation is not  visualized. No aortic stenosis is present.   5. There is mild dilatation of the ascending aorta, measuring 42 mm.   6. The inferior vena cava is normal in size with greater than 50%  respiratory variability, suggesting right atrial pressure of 3 mmHg.   EKG:  EKG is not ordered today.    Recent Labs: 03/27/2021: Magnesium 1.9 04/08/2021: ALT 10; TSH 4.825 04/15/2021: BUN 21; Creatinine, Ser 1.00; Hemoglobin 9.0; Platelets 212; Potassium 4.6; Sodium 136  Recent Lipid Panel    Component Value Date/Time   CHOL 108  04/09/2021 0500   TRIG 183 (H) 04/09/2021 0500   HDL 18 (L) 04/09/2021 0500   CHOLHDL 6.0 04/09/2021 0500   VLDL 37 04/09/2021 0500   LDLCALC 53 04/09/2021 0500     Risk  Assessment/Calculations:            Physical Exam:    VS:  BP 101/63   Pulse 86   Ht 5' 3"  (1.6 m)   Wt 162 lb (73.5 kg)   SpO2 96%   BMI 28.70 kg/m     Wt Readings from Last 3 Encounters:  06/10/21 162 lb (73.5 kg)  06/01/21 162 lb (73.5 kg)  04/22/21 154 lb 6.4 oz (70 kg)     GEN:  Well nourished, well developed in no acute distress HEENT: Normal NECK: No JVD; No carotid bruits LYMPHATICS: No lymphadenopathy CARDIAC: RRR, no murmurs, rubs, gallops RESPIRATORY:  Clear to auscultation without rales, wheezing or rhonchi  ABDOMEN: Soft, non-tender, non-distended MUSCULOSKELETAL:  No edema; No deformity  SKIN: Warm and dry NEUROLOGIC:  Alert and oriented x 3 PSYCHIATRIC:  Normal affect   ASSESSMENT:    1. Chronic combined systolic and diastolic CHF (congestive heart failure) ([place])   2. Medication management   3. Primary hypertension   4. Hyperlipidemia LDL goal <70   5. Controlled type 2 diabetes mellitus without complication, without long-term current use of insulin ([place])   6. H/O: CVA (cerebrovascular accident)    PLAN:    In order of problems listed above:  Chronic combined systolic and diastolic heart failure: Euvolemic on exam.  Continue carvedilol, Entresto and spironolactone.  Proceed with nuclear stress test to rule out ischemic cardiomyopathy.  Repeat echocardiogram in 47-month Hypertension: Blood pressure stable on current therapy  Hyperlipidemia: On Lipitor.  DM2: Managed per primary care provider  History of CVA: Continue aspirin and Plavix.        Medication Adjustments/Labs and Tests Ordered: Current medicines are reviewed at length with the patient today.  Concerns regarding medicines are outlined above.  Orders Placed This Encounter  Procedures   Basic Metabolic  Panel (BMET)   MYOCARDIAL PERFUSION IMAGING   ECHOCARDIOGRAM LIMITED   No orders of the defined types were placed in this encounter.   Patient Instructions  Medication Instructions:  Continue current medications  *If you need a refill on your cardiac medications before your next appointment, please call your pharmacy*   Lab Work: BMP Today  If you have labs (blood work) drawn today and your tests are completely normal, you will receive your results only by: [place](if you have [place]Chart) OR A paper copy in the mail If you have any lab test that is abnormal or we need to change your treatment, we will call you to review the results.   Testing/Procedures: Your physician has requested that you have a Limited echocardiogram in 2 Month. Echocardiography is a painless test that uses sound waves to create images of your heart. It provides your doctor with information about the size and shape of your heart and how well your heart's chambers and valves are working. This procedure takes approximately one hour. There are no restrictions for this procedure.  Your physician has requested that you have a lexiscan myoview. For further information please visit [email or web address] Please follow instruction sheet, as given.   Follow-Up: [facility] and your health needs are our priority.  As part of our continuing mission to provide you with exceptional heart care, we have created designated Provider Care Teams.  These Care Teams include your primary Cardiologist (physician) and Advanced Practice Providers (APPs -  Physician Assistants and Nurse Practitioners) who all work together to provide you with the care you need, when you need it.  We recommend signing up for the patient portal called "MyChart".  Sign up information is provided on this After Visit Summary.  MyChart is used to connect with patients for Virtual Visits (Telemedicine).  Patients are able to view lab/test results,  encounter notes, upcoming appointments, etc.  Non-urgent messages can be sent to your provider as well.   To learn more about what you can do with MyChart, go to [email or web address].    Your next appointment:   2-3 month(s)  The format for your next appointment:   In Person  Provider:   You may see Minus Breeding, MD or one of the following Advanced Practice Providers on your designated Care Team:   Rosaria Ferries, PA-C Caron Presume, PA-C Jory Sims, DNP, ANP    Signed, Almyra Deforest, [place]  06/12/2021 11:42 PM    [place]

## 2021-06-10 NOTE — Patient Instructions (Signed)
Medication Instructions:  Continue current medications  *If you need a refill on your cardiac medications before your next appointment, please call your pharmacy*   Lab Work: BMP Today  If you have labs (blood work) drawn today and your tests are completely normal, you will receive your results only by: [place] (if you have MyChart) OR A paper copy in the mail If you have any lab test that is abnormal or we need to change your treatment, we will call you to review the results.   Testing/Procedures: Your physician has requested that you have a Limited echocardiogram in 2 Month. Echocardiography is a painless test that uses sound waves to create images of your heart. It provides your doctor with information about the size and shape of your heart and how well your heart's chambers and valves are working. This procedure takes approximately one hour. There are no restrictions for this procedure.  Your physician has requested that you have a lexiscan myoview. For further information please visit [email or web address]. Please follow instruction sheet, as given.   Follow-Up: At [facility], you and your health needs are our priority.  As part of our continuing mission to provide you with exceptional heart care, we have created designated Provider Care Teams.  These Care Teams include your primary Cardiologist (physician) and Advanced Practice Providers (APPs -  Physician Assistants and Nurse Practitioners) who all work together to provide you with the care you need, when you need it.  We recommend signing up for the patient portal called "MyChart".  Sign up information is provided on this After Visit Summary.  MyChart is used to connect with patients for Virtual Visits (Telemedicine).  Patients are able to view lab/test results, encounter notes, upcoming appointments, etc.  Non-urgent messages can be sent to your provider as well.   To learn more about what you can do with MyChart, go to  [email or web address].    Your next appointment:   2-3 month(s)  The format for your next appointment:   In Person  Provider:   You may see Minus Breeding, MD or one of the following Advanced Practice Providers on your designated Care Team:   Rosaria Ferries, PA-C Caron Presume, PA-C Jory Sims, DNP, ANP

## 2021-06-12 ENCOUNTER — Encounter: Payer: Self-pay | Admitting: Physician Assistant

## 2021-06-14 NOTE — Addendum Note (Signed)
Addended byEulas Post, Zadin Lange on: 06/14/2021 09:05 AM   Modules accepted: Orders

## 2021-06-14 NOTE — Addendum Note (Signed)
Addended by: Jacqulynn Cadet on: 06/14/2021 08:09 AM   Modules accepted: Orders

## 2021-06-15 ENCOUNTER — Telehealth (HOSPITAL_COMMUNITY): Payer: Self-pay | Admitting: *Deleted

## 2021-06-15 NOTE — Telephone Encounter (Signed)
Close encounter 

## 2021-06-16 ENCOUNTER — Other Ambulatory Visit: Payer: Self-pay

## 2021-06-16 ENCOUNTER — Ambulatory Visit (HOSPITAL_COMMUNITY)
Admission: RE | Admit: 2021-06-16 | Discharge: 2021-06-16 | Disposition: A | Discharge status: Home / Self Care | Payer: Medicare Other | Source: Ambulatory Visit | Attending: Cardiology | Admitting: Cardiology

## 2021-06-16 DIAGNOSIS — I5042 Chronic combined systolic (congestive) and diastolic (congestive) heart failure: Secondary | ICD-10-CM | POA: Insufficient documentation

## 2021-06-16 LAB — MYOCARDIAL PERFUSION IMAGING
LV dias vol: 118 mL (ref 62–150)
LV sys vol: 86 mL
Peak HR: 98 {beats}/min
Rest HR: 69 {beats}/min
SDS: 5
SRS: 7
SSS: 12
TID: 1.1

## 2021-06-16 MED ORDER — REGADENOSON 0.4 MG/5ML IV SOLN
0.4000 mg | Freq: Once | INTRAVENOUS | Status: AC
  Administered 2021-06-16: 0.4 mg via INTRAVENOUS

## 2021-06-16 MED ORDER — TECHNETIUM TC 99M TETROFOSMIN IV KIT
28.6000 | PACK | Freq: Once | INTRAVENOUS | Status: AC | PRN
  Administered 2021-06-16: 28.6 via INTRAVENOUS
  Filled 2021-06-16: qty 29

## 2021-06-16 MED ORDER — TECHNETIUM TC 99M TETROFOSMIN IV KIT
10.9000 | PACK | Freq: Once | INTRAVENOUS | Status: AC | PRN
  Administered 2021-06-16: 10.9 via INTRAVENOUS
  Filled 2021-06-16: qty 11

## 2021-06-17 LAB — BASIC METABOLIC PANEL
BUN/Creatinine Ratio: 28 — ABNORMAL HIGH (ref 10–24)
BUN: 27 mg/dL (ref 8–27)
CO2: 18 mmol/L — ABNORMAL LOW (ref 20–29)
Calcium: 9.7 mg/dL (ref 8.6–10.2)
Chloride: 107 mmol/L — ABNORMAL HIGH (ref 96–106)
Creatinine, Ser: 0.95 mg/dL (ref 0.76–1.27)
Glucose: 91 mg/dL (ref 65–99)
Potassium: 4.6 mmol/L (ref 3.5–5.2)
Sodium: 141 mmol/L (ref 134–144)
eGFR: 82 mL/min/{1.73_m2} (ref >59)

## 2021-06-20 ENCOUNTER — Encounter: Payer: Self-pay | Admitting: *Deleted

## 2021-07-15 ENCOUNTER — Other Ambulatory Visit: Payer: Self-pay

## 2021-07-15 NOTE — Patient Outreach (Signed)
[facility]) Care Management  07/15/2021  NUCHEM GREVE Jan 09, 1946 [id number]   Telephone outreach to patient to obtain mRS was successfully completed. MRS= 4  Thank you, [place] Care Management Assistant

## 2021-08-09 ENCOUNTER — Ambulatory Visit (HOSPITAL_COMMUNITY): Payer: Medicare Other

## 2021-08-25 ENCOUNTER — Other Ambulatory Visit (HOSPITAL_COMMUNITY): Payer: Medicare Other

## 2021-09-01 ENCOUNTER — Ambulatory Visit (HOSPITAL_COMMUNITY): Payer: Medicare Other | Attending: Cardiovascular Disease

## 2021-09-01 ENCOUNTER — Other Ambulatory Visit: Payer: Self-pay

## 2021-09-01 DIAGNOSIS — I5042 Chronic combined systolic (congestive) and diastolic (congestive) heart failure: Secondary | ICD-10-CM | POA: Insufficient documentation

## 2021-09-02 LAB — ECHOCARDIOGRAM LIMITED
Area-P 1/2: 3.08 cm2
S' Lateral: 3.9 cm

## 2021-09-02 NOTE — Progress Notes (Signed)
Pumping function of the heart slightly improved when compare to the previous echo, however still moderately down. Keep follow up with Dr. Percival Spanish

## 2021-09-15 NOTE — Progress Notes (Signed)
Cardiology Office Note   Date:  09/16/2021   ID:  Brad Harrell, DOB 1945-11-25, MRN [id number]  PCP:  Nolene Ebbs, MD  Cardiologist:   Minus Breeding, MD   Chief Complaint  Patient presents with   Cardiomyopathy       History of Present Illness: Brad Harrell is a 75 y.o. male who presents for evaluation of diastolic HF.  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  Cardiology service was consulted due to his chronic combined heart failure and suspicion for cardioembolic CVA.  Previous echocardiogram obtained on 03/24/2021 showed EF 25 to 30%..  During this admission, he was placed on appropriate heart failure medication and recommend outpatient follow-up with cardiology service.  Unfortunately, he went back to the hospital with recurrent CVA.  MRI of the brain confirmed acute cortical infarction of the left MCA distribution.  TTE with bubble study performed on 04/11/2021 showed EF 25 to 30%, severe LV dysfunction, LV global hypokinesis, grade 1 DD, mild LVH.  He was placed on 325 mg daily of aspirin and a 75 mg daily of Plavix by neurology service.  We have been titrating his medications.  His last ejection fraction was 30 to 35% earlier this month.  This was slightly improved.   He did have a perfusion study.  This demonstrated an old inferior infarct but no ischemia and he was managed medically.  That was this summer.  He is actually done quite well. The patient denies any new symptoms such as chest discomfort, neck or arm discomfort. There has been no new shortness of breath, PND or orthopnea. There have been no reported palpitations, presyncope or syncope.   If he is going long distances he gets around in a wheelchair but shorter distances he ambulates in the house .    Past Medical History:  Diagnosis Date   Anemia    Iron Defficiency   Cardiomyopathy ([place])    Combined systolic and diastolic congestive heart failure (HCC)     Diabetes mellitus without complication ([place])    TYPE 2   Foley catheter in place    SINCE NOV 2017   H/O cocaine abuse ([place])    Hyperlipidemia    Hypertension    Hypothyroidism    Liver abscess    measuring 7 x 9 cm/notes 03/04/2013   Stroke ([place]) 2010   WEAK ON BOTH SIDES NOW TOTAL OF 3 STROKES    Urinary retention     Past Surgical History:  Procedure Laterality Date   APPENDECTOMY     BUBBLE STUDY  04/14/2021   Procedure: BUBBLE STUDY;  Surgeon: [name], MD;  Location: [facility] ENDOSCOPY;  Service: Cardiology;;   COLONOSCOPY N/A 03/11/2013   Procedure: COLONOSCOPY;  Surgeon: Beryle Beams, MD;  Location: [place];  Service: Endoscopy;  Laterality: N/A;   COLONOSCOPY WITH PROPOFOL N/A 03/15/2018   Procedure: COLONOSCOPY WITH PROPOFOL;  Surgeon: Carol Ada, MD;  Location: [facility] ENDOSCOPY;  Service: Endoscopy;  Laterality: N/A;   CYSTOSCOPY WITH INSERTION OF UROLIFT N/A 01/04/2017   Procedure: CYSTOSCOPY WITH INSERTION OF UROLIFT x8;  Surgeon: Cleon Gustin, MD;  Location: [facility] ORS;  Service: Urology;  Laterality: N/A;   ESOPHAGOGASTRODUODENOSCOPY N/A 03/11/2013   Procedure: ESOPHAGOGASTRODUODENOSCOPY (EGD);  Surgeon: Beryle Beams, MD;  Location: [facility] ENDOSCOPY;  Service: Endoscopy;  Laterality: N/A;   GIVENS CAPSULE STUDY N/A 03/12/2013   Procedure: GIVENS CAPSULE STUDY;  Surgeon: Beryle Beams, MD;  Location: [facility] ENDOSCOPY;  Service: Endoscopy;  Laterality: N/A;   INGUINAL HERNIA REPAIR Right    IR CT HEAD LTD  04/08/2021   IR PERCUTANEOUS ART THROMBECTOMY/INFUSION INTRACRANIAL INC DIAG ANGIO  04/08/2021   POLYPECTOMY  03/15/2018   Procedure: POLYPECTOMY;  Surgeon: Carol Ada, MD;  Location: [facility] ENDOSCOPY;  Service: Endoscopy;;   RADIOLOGY WITH ANESTHESIA N/A 04/08/2021   Procedure: IR WITH ANESTHESIA;  Surgeon: Radiologist, Medication, MD;  Location: [place];  Service: Radiology;  Laterality: N/A;   SCROTAL EXPLORATION Left 03/25/2021   Procedure: LEFT SCROTUM EXPLORATION WITH  ORCHIECTOMY;  Surgeon: [name], MD;  Location: [facility]S;  Service: Urology;  Laterality: Left;   TEE WITHOUT CARDIOVERSION N/A 04/14/2021   Procedure: TRANSESOPHAGEAL ECHOCARDIOGRAM (TEE);  Surgeon: [name], MD;  Location: [place];  Service: Cardiology;  Laterality: N/A;   TIBIA FRACTURE SURGERY Left    TRANSURETHRAL RESECTION OF PROSTATE N/A 03/25/2021   Procedure: CYSTOSCOPY TRANSURETHRAL RESECTION OF THE PROSTATE (TURP);  Surgeon: [name], MD;  Location: [facility];  Service: Urology;  Laterality: N/A;     Current Outpatient Medications  Medication Sig Dispense Refill   aspirin EC 81 MG tablet Take 81 mg by mouth daily. Swallow whole.     atorvastatin (LIPITOR) 20 MG tablet TAKE ONE TABLET BY MOUTH DAILY 30 tablet 0   carvedilol (COREG) 6.25 MG tablet Take 1 tablet (6.25 mg total) by mouth 2 (two) times daily. 180 tablet 3   ergocalciferol (VITAMIN D2) 1.25 MG (50000 UT) capsule Take 50,000 Units by mouth once a week.     ertugliflozin L-PyroglutamicAc (STEGLATRO) 5 MG TABS tablet Take 5 mg by mouth daily.     finasteride (PROSCAR) 5 MG tablet Take 5 mg by mouth daily at 12 noon.     glucose blood (FREESTYLE LITE) test strip Use as instructed 100 each 12   glucose monitoring kit (FREESTYLE) monitoring kit 1 each by Does not apply route as needed for other. For blood sugar check 1 each 0   Lancets 28G MISC To check blood sugar three times daily with meals and at bedtime 120 each 3   levothyroxine (SYNTHROID, LEVOTHROID) 100 MCG tablet Take 100 mcg by mouth daily before breakfast.      metFORMIN (GLUCOPHAGE) 500 MG tablet Take 500 mg by mouth daily with breakfast.      sacubitril-valsartan (ENTRESTO) 24-26 MG Take 1 tablet by mouth 2 (two) times daily.     spironolactone (ALDACTONE) 25 MG tablet Take 0.5 tablets (12.5 mg total) by mouth daily. 45 tablet 3   No current facility-administered medications for this visit.    Allergies:   Rocephin [ceftriaxone]   ROS:  Please  see the history of present illness.   Otherwise, review of systems are positive for none.   All other systems are reviewed and negative.    PHYSICAL EXAM: VS:  BP 130/78   Pulse 82   Ht _0  (1.6 m)   Wt 166 lb (75.3 kg)   SpO2 99%   BMI 29.41 kg/m  , BMI Body mass index is 29.41 kg/m. GEN:  No distress NECK:  No jugular venous distention at 90 degrees, waveform within normal limits, carotid upstroke brisk and symmetric, no bruits, no thyromegaly LYMPHATICS:  No cervical adenopathy LUNGS:  Clear to auscultation bilaterally BACK:  No CVA tenderness CHEST:  Unremarkable HEART:  S1 and S2 within normal limits, no S3, no S4, no clicks, no rubs, no murmurs ABD:  Positive bowel sounds  normal in frequency in pitch, no bruits, no rebound, no guarding, unable to assess midline mass or bruit with the patient seated. EXT:  2 plus pulses throughout, no edema, no cyanosis no clubbing SKIN:  No rashes no nodules NEURO:  Cranial nerves II through XII grossly intact, motor grossly intact throughout PSYCH:  Cognitively intact, oriented to person place and time   EKG:  EKG is not ordered today. NA   Recent Labs: 03/27/2021: Magnesium 1.9 04/08/2021: ALT 10; TSH 4.825 04/15/2021: Hemoglobin 9.0; Platelets 212 06/10/2021: BUN 27; Creatinine, Ser 0.95; Potassium 4.6; Sodium 141    Lipid Panel    Component Value Date/Time   CHOL 108 04/09/2021 0500   TRIG 183 (H) 04/09/2021 0500   HDL 18 (L) 04/09/2021 0500   CHOLHDL 6.0 04/09/2021 0500   VLDL 37 04/09/2021 0500   LDLCALC 53 04/09/2021 0500      Wt Readings from Last 3 Encounters:  09/16/21 166 lb (75.3 kg)  06/16/21 162 lb (73.5 kg)  06/10/21 162 lb (73.5 kg)      Other studies Reviewed: Additional studies/ records that were reviewed today include: Lexiscan Myoview., labs. Review of the above records demonstrates:  Please see elsewhere in the note.     ASSESSMENT AND PLAN:  Chronic combined systolic and diastolic heart failure:  I will review this with his daughter and the patient today.  We are going to continue to titrate for what is probably an ischemic cardiomyopathy.  Toward that end I am going to increase his carvedilol to 6.25 mg twice daily.  They are encouraged to bring the medications with them at every visit.  Neck step would be to increase his Entresto.  We should see him back in frequently for med titration at that consider another echo.    Hypertension: This is being managed in the context of treating his CHF   Hyperlipidemia: LDL was 53 with an HDL of 18.  No change in therapy.  DM2: A1c was 5.3.   History of CVA:   He has some residual mild aphasia that has improved but otherwise has done well.  Continue the meds as listed.   Current medicines are reviewed at length with the patient today.  The patient does not have concerns regarding medicines.  The following changes have been made: As above  Labs/ tests ordered today include: None No orders of the defined types were placed in this encounter.    Disposition:   FU with APP or me in a couple of weeks.  This could be virtual if they have a BP cuff.      Signed, Minus Breeding, MD  09/16/2021 10:50 AM    [place]

## 2021-09-16 ENCOUNTER — Encounter: Payer: Self-pay | Admitting: Cardiology

## 2021-09-16 ENCOUNTER — Other Ambulatory Visit: Payer: Self-pay

## 2021-09-16 ENCOUNTER — Ambulatory Visit (INDEPENDENT_AMBULATORY_CARE_PROVIDER_SITE_OTHER): Payer: Medicare Other | Admitting: Cardiology

## 2021-09-16 VITALS — BP 130/78 | HR 82 | Ht 63.0 in | Wt 166.0 lb

## 2021-09-16 DIAGNOSIS — I63512 Cerebral infarction due to unspecified occlusion or stenosis of left middle cerebral artery: Secondary | ICD-10-CM | POA: Diagnosis not present

## 2021-09-16 DIAGNOSIS — I10 Essential (primary) hypertension: Secondary | ICD-10-CM | POA: Diagnosis not present

## 2021-09-16 DIAGNOSIS — I5042 Chronic combined systolic (congestive) and diastolic (congestive) heart failure: Secondary | ICD-10-CM

## 2021-09-16 DIAGNOSIS — E785 Hyperlipidemia, unspecified: Secondary | ICD-10-CM | POA: Diagnosis not present

## 2021-09-16 DIAGNOSIS — E119 Type 2 diabetes mellitus without complications: Secondary | ICD-10-CM | POA: Diagnosis not present

## 2021-09-16 MED ORDER — CARVEDILOL 6.25 MG PO TABS: 6.2500 mg | ORAL_TABLET | Freq: Two times a day (BID) | ORAL | 3 refills | Status: DC

## 2021-09-16 NOTE — Patient Instructions (Signed)
Medication Instructions:  Increase Carvedilol to 6.25 mg twice a day Continue all other medications *If you need a refill on your cardiac medications before your next appointment, please call your pharmacy*   Lab Work: None  ordered   Testing/Procedures: None ordered   Follow-Up: At [facility], you and your health needs are our priority.  As part of our continuing mission to provide you with exceptional heart care, we have created designated Provider Care Teams.  These Care Teams include your primary Cardiologist (physician) and Advanced Practice Providers (APPs -  Physician Assistants and Nurse Practitioners) who all work together to provide you with the care you need, when you need it.  We recommend signing up for the patient portal called "MyChart".  Sign up information is provided on this After Visit Summary.  MyChart is used to connect with patients for Virtual Visits (Telemedicine).  Patients are able to view lab/test results, encounter notes, upcoming appointments, etc.  Non-urgent messages can be sent to your provider as well.   To learn more about what you can do with MyChart, go to [email or web address].      Your next appointment:  2 weeks    The format for your next appointment: Office    Provider:  Dr.Hochrein or Almyra Deforest PA

## 2021-10-04 ENCOUNTER — Ambulatory Visit (HOSPITAL_BASED_OUTPATIENT_CLINIC_OR_DEPARTMENT_OTHER): Payer: Medicare Other | Admitting: Family

## 2021-10-06 ENCOUNTER — Ambulatory Visit: Payer: Medicare Other | Admitting: Adult Health

## 2021-10-19 NOTE — Progress Notes (Signed)
Office Visit    Patient Name: Brad Harrell Date of Encounter: 10/20/2021  PCP:  Nolene Ebbs, MD   Startup  Cardiologist:  Minus Breeding, MD  Advanced Practice Provider:  No care team member to display Electrophysiologist:  None   Chief Complaint    Brad Harrell is a 75 y.o. male with a hx of cardiomyopathy, combined systolic and diastolic heart failure, diabetes mellitus without complication, hyperlipidemia, hypertension, hypothyroidism, stroke x3 with weakness on both sides, and urinary retention who presents today for follow-up appointment.  Past Medical History    Past Medical History:  Diagnosis Date   Anemia    Iron Defficiency   Cardiomyopathy ([place])    Combined systolic and diastolic congestive heart failure (HCC)    Diabetes mellitus without complication ([place])    TYPE 2   Foley catheter in place    SINCE NOV 2017   H/O cocaine abuse ([place])    Hyperlipidemia    Hypertension    Hypothyroidism    Liver abscess    measuring 7 x 9 cm/notes 03/04/2013   Stroke ([place]) 2010   WEAK ON BOTH SIDES NOW TOTAL OF 3 STROKES    Urinary retention    Past Surgical History:  Procedure Laterality Date   APPENDECTOMY     BUBBLE STUDY  04/14/2021   Procedure: BUBBLE STUDY;  Surgeon: [name], MD;  Location: [facility] ENDOSCOPY;  Service: Cardiology;;   COLONOSCOPY N/A 03/11/2013   Procedure: COLONOSCOPY;  Surgeon: Beryle Beams, MD;  Location: [place];  Service: Endoscopy;  Laterality: N/A;   COLONOSCOPY WITH PROPOFOL N/A 03/15/2018   Procedure: COLONOSCOPY WITH PROPOFOL;  Surgeon: Carol Ada, MD;  [facility] ENDOSCOPY;  Service: Endoscopy;  Laterality: N/A;   CYSTOSCOPY WITH INSERTION OF UROLIFT N/A 01/04/2017   Procedure: CYSTOSCOPY WITH INSERTION OF UROLIFT x8;  Surgeon: Cleon Gustin, MD;  [facility];  Service: Urology;  Laterality: N/A;   ESOPHAGOGASTRODUODENOSCOPY N/A 03/11/2013   Procedure: ESOPHAGOGASTRODUODENOSCOPY  (EGD);  Surgeon: Beryle Beams, MD;  Location: [facility] ENDOSCOPY;  Service: Endoscopy;  Laterality: N/A;   GIVENS CAPSULE STUDY N/A 03/12/2013   Procedure: GIVENS CAPSULE STUDY;  Surgeon: Beryle Beams, MD;  Location: [place];  Service: Endoscopy;  Laterality: N/A;   INGUINAL HERNIA REPAIR Right    IR CT HEAD LTD  04/08/2021   IR PERCUTANEOUS ART THROMBECTOMY/INFUSION INTRACRANIAL INC DIAG ANGIO  04/08/2021   POLYPECTOMY  03/15/2018   Procedure: POLYPECTOMY;  Surgeon: Carol Ada, MD;  Location: [facility] ENDOSCOPY;  Service: Endoscopy;;   RADIOLOGY WITH ANESTHESIA N/A 04/08/2021   Procedure: IR WITH ANESTHESIA;  Surgeon: Radiologist, Medication, MD;  Location: [place];  Service: Radiology;  Laterality: N/A;   SCROTAL EXPLORATION Left 03/25/2021   Procedure: LEFT SCROTUM EXPLORATION WITH ORCHIECTOMY;  Surgeon: [name], MD;  Location: [facility];  Service: Urology;  Laterality: Left;   TEE WITHOUT CARDIOVERSION N/A 04/14/2021   Procedure: TRANSESOPHAGEAL ECHOCARDIOGRAM (TEE);  Surgeon: [name], MD;  Location: [place];  Service: Cardiology;  Laterality: N/A;   TIBIA FRACTURE SURGERY Left    TRANSURETHRAL RESECTION OF PROSTATE N/A 03/25/2021   Procedure: CYSTOSCOPY TRANSURETHRAL RESECTION OF THE PROSTATE (TURP);  Surgeon: [name], MD;  Location: [facility];  Service: Urology;  Laterality: N/A;    Allergies  Allergies  Allergen Reactions   Rocephin [Ceftriaxone] Nausea And Vomiting    History of Present Illness    Brad Harrell is a 75 y.o.  male with a  hx of cardiomyopathy, combined systolic and diastolic heart failure, diabetes mellitus without complication, hyperlipidemia, hypertension, hypothyroidism, stroke x3 with weakness on both sides, and urinary retention who presents today for follow-up appointment last seen 09/16/2021 by Dr. Percival Spanish.   He was hospitalized in June 2022 with a left MCA CVA treated with thrombectomy.  Cardiology service was consulted due to his chronic  combined heart failure and suspicion for cardioembolic CVA.  Previous echocardiogram obtained on 03/24/2021 showed EF of 25 to 30%.  During this admission he was placed on appropriate heart failure medication and recommended outpatient follow-up with cardiology service.  Unfortunately, he went back to the hospital with recurrent CVA.  MRI of the brain confirmed acute cortical infarct of the left MCA distribution.  TTE with bubble study performed on 03/2021 showed EF 25 to 30%, severe LV dysfunction, LV global hypokinesis, grade 1 DD, mild LVH.  He was placed on 325 mg of aspirin daily and 75 mg of Plavix daily by neurology service.  We have been titrating his medications.  His last EF was 30 to 35% earlier in November.  This is slightly improved.  He did have a perfusion study.  This demonstrated an old inferior infarct but no ischemia and was managed medically.  That occurred this past summer.  He has actually done quite well.  When last seen the patient denies any symptoms of chest discomfort, neck or arm discomfort.  There was no new shortness of breath, PND or orthopnea.  There have been no reported palpitations, presyncope or syncope.  If he is going longer distances he gets around in a wheelchair but shorter distances he ambulates in his house.  Today, he overall feels well with no cardiac related complaints.  His blood pressure is usually low normal at home but does not get below 619 systolic.  He denies any dizziness or lightheadedness.  He denies any syncope or near syncopal episodes.  He is in a wheelchair today which he uses for longer trips.  He does well at home walking from room to room without any assistance.  He did share that he occasionally has some pain with walking and it was hard to do certain whether this was orthopedic pain or muscular pain.  I spoke with his caregiver and she stated that he never mentioned this to her in the past.  His recent lipid panel shows total cholesterol 108, LDL 53,  HDL 18, and triglycerides 183.  We discussed trying fish oil for his triglycerides today and he would prefer to try a Mediterranean diet to lower his triglycerides.  We went over a few recommended foods to try as well as providing some supplemental written education.  He has not had any chest pain or shortness of breath with activity.  We are unable to titrate his Entresto today due to his blood pressure being low normal.  He remains on Plavix 75 mg/day without any bleeding.  He states that he has not had any lower extremity edema.  We discussed weighing regularly and calling our office if he gains more than 2 to 3 pounds overnight or 5 pounds in a week.  He is also on spironolactone and Lasix for diuretics.  Reports no shortness of breath nor dyspnea on exertion. Reports no chest pain, pressure, or tightness. No edema, orthopnea, PND. Reports no palpitations.    EKGs/Labs/Other Studies Reviewed:   The following studies were reviewed today:  Echocardiogram 09/01/2021 IMPRESSIONS     1.  Left ventricular ejection fraction, by estimation, is 30 to 35%. The  left ventricle has moderately decreased function. The left ventricle  demonstrates global hypokinesis. Left ventricular diastolic parameters are  consistent with Grade I diastolic  dysfunction (impaired relaxation). The average left ventricular global  longitudinal strain is -15.6 %. The global longitudinal strain is  abnormal.   2. The mitral valve is normal in structure. Trivial mitral valve  regurgitation. No evidence of mitral stenosis.   3. The aortic valve is tricuspid. Aortic valve regurgitation is not  visualized. No aortic stenosis is present.   4. Aortic dilatation noted. There is mild dilatation of the aortic root,  measuring 37 mm.   5. There is normal pulmonary artery systolic pressure.   Comparison(s): TEE 04/14/21 EF 35%  TTE 04/11/21 EF 25-30%.    Myoview 06/17/2019  The left ventricular ejection fraction is severely  decreased (<30%). Nuclear stress EF: 27%. Blood pressure demonstrated a normal response to exercise. There was no ST segment deviation noted during stress. Findings consistent with prior myocardial infarction. This is a high risk study.   Large inferior wall infarct from apex to base no ischemia EF 27% diffuse hypokinesis worse in inferior wall   EKG:  EKG is  ordered today.  The ekg ordered today demonstrates NSR in the 80s.   Recent Labs: 03/27/2021: Magnesium 1.9 04/08/2021: ALT 10; TSH 4.825 04/15/2021: Hemoglobin 9.0; Platelets 212 06/10/2021: BUN 27; Creatinine, Ser 0.95; Potassium 4.6; Sodium 141  Recent Lipid Panel    Component Value Date/Time   CHOL 108 04/09/2021 0500   TRIG 183 (H) 04/09/2021 0500   HDL 18 (L) 04/09/2021 0500   CHOLHDL 6.0 04/09/2021 0500   VLDL 37 04/09/2021 0500   LDLCALC 53 04/09/2021 0500     Home Medications   Current Meds  Medication Sig   aspirin EC 81 MG tablet Take 81 mg by mouth daily. Swallow whole.   atorvastatin (LIPITOR) 20 MG tablet TAKE ONE TABLET BY MOUTH DAILY   carvedilol (COREG) 6.25 MG tablet Take 1 tablet (6.25 mg total) by mouth 2 (two) times daily.   clopidogrel (PLAVIX) 75 MG tablet Take 75 mg by mouth daily.   ergocalciferol (VITAMIN D2) 1.25 MG (50000 UT) capsule Take 50,000 Units by mouth once a week.   finasteride (PROSCAR) 5 MG tablet Take 5 mg by mouth daily at 12 noon.   furosemide (LASIX) 20 MG tablet Take 20 mg by mouth daily.   glucose blood (FREESTYLE LITE) test strip Use as instructed   glucose monitoring kit (FREESTYLE) monitoring kit 1 each by Does not apply route as needed for other. For blood sugar check   Lancets 28G MISC To check blood sugar three times daily with meals and at bedtime   levothyroxine (SYNTHROID, LEVOTHROID) 100 MCG tablet Take 100 mcg by mouth daily before breakfast.    sacubitril-valsartan (ENTRESTO) 24-26 MG Take 1 tablet by mouth 2 (two) times daily.   spironolactone (ALDACTONE) 25 MG  tablet Take 0.5 tablets (12.5 mg total) by mouth daily.     Review of Systems      All other systems reviewed and are otherwise negative except as noted above.  Physical Exam    VS:  BP (!) 106/58    Pulse 83    Ht $R'5\' 3"'[place]$  (1.6 m)    BMI 29.41 kg/m  , BMI Body mass index is 29.41 kg/m.  Wt Readings from Last 3 Encounters:  09/16/21 166 lb (75.3 kg)  06/16/21 162  lb (73.5 kg)  06/10/21 162 lb (73.5 kg)     GEN: Well nourished, well developed, in no acute distress. HEENT: normal. Neck: Supple, no JVD, carotid bruits, or masses. Cardiac: RRR, no murmurs, rubs, or gallops. No clubbing, cyanosis, edema.  Radials/PT 1+ and equal bilaterally.  Respiratory:  Respirations regular and unlabored, clear to auscultation bilaterally. GI: Soft, nontender, nondistended. MS: No deformity or atrophy. Skin: Warm and dry, no rash. Neuro:  Strength and sensation are intact. Psych: Normal affect.  Assessment & Plan    Chronic combined systolic and diastolic heart failure -Possibly try to increase his Entresto -Continue GDMT: Coreg, Entresto, spironolactone, lasix, and Lipitor -He does not appear fluid overloaded on exam today, denies SOB -Encourage daily weights and daily blood pressure readings -If you gain more than 2 to 3 pounds overnight or 5 pounds in a week please call our office  Hypertension -Well-controlled in the clinic today -Continue Entresto and Coreg -Continue low-sodium diet -Continue to monitor blood pressure at home and record  Hyperlipidemia -Recent lipid panel 03/2021 showed total cholesterol 108, HDL 18, LDL 53, and triglycerides 183 -Discussed dietary changes and will repeat lipid panel when he comes back in 2-3 months to see Dr. Percival Spanish.   Diabetes mellitus type 2 -Continue metformin and steglarto -Hemoglobin A1c is 5.3 as of 5/22 -Appears to be well controlled  History of CVA -He remains with some residual mild aphasia that has improved but otherwise is done  well -Continue current medication regimen  6. Possible claudication -may be orthopedic in nature, difficult to distinguish -his caregiver will let us know if it is truly vascular -may need further workup if this continues   Disposition: Follow up 2-3 months with Minus Breeding, MD or APP.  Signed Elgie Collard, PA-C 10/20/2021, 10:51 AM [facility]

## 2021-10-20 ENCOUNTER — Ambulatory Visit (INDEPENDENT_AMBULATORY_CARE_PROVIDER_SITE_OTHER): Payer: Medicare Other | Admitting: Physician Assistant

## 2021-10-20 ENCOUNTER — Encounter (HOSPITAL_BASED_OUTPATIENT_CLINIC_OR_DEPARTMENT_OTHER): Payer: Self-pay | Admitting: Physician Assistant

## 2021-10-20 ENCOUNTER — Other Ambulatory Visit: Payer: Self-pay

## 2021-10-20 VITALS — BP 106/58 | HR 83 | Ht 63.0 in

## 2021-10-20 DIAGNOSIS — E785 Hyperlipidemia, unspecified: Secondary | ICD-10-CM | POA: Diagnosis not present

## 2021-10-20 DIAGNOSIS — I63512 Cerebral infarction due to unspecified occlusion or stenosis of left middle cerebral artery: Secondary | ICD-10-CM | POA: Diagnosis not present

## 2021-10-20 DIAGNOSIS — I10 Essential (primary) hypertension: Secondary | ICD-10-CM | POA: Diagnosis not present

## 2021-10-20 DIAGNOSIS — I5042 Chronic combined systolic (congestive) and diastolic (congestive) heart failure: Secondary | ICD-10-CM

## 2021-10-20 DIAGNOSIS — Z8673 Personal history of transient ischemic attack (TIA), and cerebral infarction without residual deficits: Secondary | ICD-10-CM

## 2021-10-20 DIAGNOSIS — E119 Type 2 diabetes mellitus without complications: Secondary | ICD-10-CM

## 2021-10-20 NOTE — Patient Instructions (Signed)
Medication Instructions:  Your Physician recommend you continue on your current medication as directed.    *If you need a refill on your cardiac medications before your next appointment, please call your pharmacy*   Lab Work: Please return for Lab work before your next appointment for Fasting Lipid Panel and Liver Function Tests. You may come to the...   [facility] (3rd floor) [address], [place], [place] [address]  Open: 8am-Noon and 1pm-4:30pm   [place] [facility] [place]- Any location  **no appointments needed**  If you have labs (blood work) drawn today and your tests are completely normal, you will receive your results only by: [employer] (if you have MyChart) OR A paper copy in the mail If you have any lab test that is abnormal or we need to change your treatment, we will call you to review the results.   Testing/Procedures: None ordered today   Follow-Up: At [facility], you and your health needs are our priority.  As part of our continuing mission to provide you with exceptional heart care, we have created designated Provider Care Teams.  These Care Teams include your primary Cardiologist (physician) and Advanced Practice Providers (APPs -  Physician Assistants and Nurse Practitioners) who all work together to provide you with the care you need, when you need it.  We recommend signing up for the patient portal called "MyChart".  Sign up information is provided on this After Visit Summary.  MyChart is used to connect with patients for Virtual Visits (Telemedicine).  Patients are able to view lab/test results, encounter notes, upcoming appointments, etc.  Non-urgent messages can be sent to your provider as well.   To learn more about what you can do with MyChart, go to [email or web address].    Your next appointment:   2-3 month(s)  The format for your next appointment:   In  Person  Provider:   Minus Breeding, MD  or APP    Other Instructions Mediterranean Diet A Mediterranean diet refers to food and lifestyle choices that are based on the traditions of countries located on the [place]. It focuses on eating more fruits, vegetables, whole grains, beans, nuts, seeds, and heart-healthy fats, and eating less dairy, meat, eggs, and processed foods with added sugar, salt, and fat. This way of eating has been shown to help prevent certain conditions and improve outcomes for people who have chronic diseases, like kidney disease and heart disease. What are tips for following this plan? Reading food labels Check the serving size of packaged foods. For foods such as rice and pasta, the serving size refers to the amount of cooked product, not dry. Check the total fat in packaged foods. Avoid foods that have saturated fat or trans fats. Check the ingredient list for added sugars, such as corn syrup. Shopping  Buy a variety of foods that offer a balanced diet, including: Fresh fruits and vegetables (produce). Grains, beans, nuts, and seeds. Some of these may be available in unpackaged forms or large amounts (in bulk). Fresh seafood. Poultry and eggs. Low-fat dairy products. Buy whole ingredients instead of prepackaged foods. Buy fresh fruits and vegetables in-season from local farmers markets. Buy plain frozen fruits and vegetables. If you do not have access to quality fresh seafood, buy precooked frozen shrimp or canned fish, such as tuna, salmon, or sardines. Stock your pantry so you always have certain foods on hand, such as olive oil, canned tuna, canned  tomatoes, rice, pasta, and beans. Cooking Cook foods with extra-virgin olive oil instead of using butter or other vegetable oils. Have meat as a side dish, and have vegetables or grains as your main dish. This means having meat in small portions or adding small amounts of meat to foods like pasta or  stew. Use beans or vegetables instead of meat in common dishes like chili or lasagna. Experiment with different cooking methods. Try roasting, broiling, steaming, and sauting vegetables. Add frozen vegetables to soups, stews, pasta, or rice. Add nuts or seeds for added healthy fats and plant protein at each meal. You can add these to yogurt, salads, or vegetable dishes. Marinate fish or vegetables using olive oil, lemon juice, garlic, and fresh herbs. Meal planning Plan to eat one vegetarian meal one day each week. Try to work up to two vegetarian meals, if possible. Eat seafood two or more times a week. Have healthy snacks readily available, such as: Vegetable sticks with hummus. Greek yogurt. Fruit and nut trail mix. Eat balanced meals throughout the week. This includes: Fruit: 2-3 servings a day. Vegetables: 4-5 servings a day. Low-fat dairy: 2 servings a day. Fish, poultry, or lean meat: 1 serving a day. Beans and legumes: 2 or more servings a week. Nuts and seeds: 1-2 servings a day. Whole grains: 6-8 servings a day. Extra-virgin olive oil: 3-4 servings a day. Limit red meat and sweets to only a few servings a month. Lifestyle  Cook and eat meals together with your family, when possible. Drink enough fluid to keep your urine pale yellow. Be physically active every day. This includes: Aerobic exercise like running or swimming. Leisure activities like gardening, walking, or housework. Get 7-8 hours of sleep each night. If recommended by your health care provider, drink red wine in moderation. This means 1 glass a day for nonpregnant women and 2 glasses a day for men. A glass of wine equals 5 oz (150 mL). What foods should I eat? Fruits Apples. Apricots. Avocado. Berries. Bananas. Cherries. Dates. Figs. Grapes. Lemons. Melon. Oranges. Peaches. Plums. Pomegranate. Vegetables Artichokes. Beets. Broccoli. Cabbage. Carrots. Eggplant. Green beans. Chard. Kale. Spinach. Onions.  Leeks. Peas. Squash. Tomatoes. Peppers. Radishes. Grains Whole-grain pasta. Brown rice. Bulgur wheat. Polenta. Couscous. Whole-wheat bread. [place]. Meats and other proteins Beans. Almonds. Sunflower seeds. Pine nuts. Peanuts. [place]. Salmon. Scallops. Shrimp. [place]. Tilapia. Clams. Oysters. Eggs. Poultry without skin. Dairy Low-fat milk. Cheese. Greek yogurt. Fats and oils Extra-virgin olive oil. Avocado oil. Grapeseed oil. Beverages Water. Red wine. Herbal tea. Sweets and desserts Greek yogurt with honey. Baked apples. Poached pears. Trail mix. Seasonings and condiments Basil. Cilantro. Coriander. Cumin. Mint. Parsley. Sage. Rosemary. Tarragon. Garlic. Oregano. Thyme. Pepper. Balsamic vinegar. Tahini. Hummus. Tomato sauce. Olives. Mushrooms. The items listed above may not be a complete list of foods and beverages you can eat. Contact a dietitian for more information. What foods should I limit? This is a list of foods that should be eaten rarely or only on special occasions. Fruits Fruit canned in syrup. Vegetables Deep-fried potatoes (french fries). Grains Prepackaged pasta or rice dishes. Prepackaged cereal with added sugar. Prepackaged snacks with added sugar. Meats and other proteins Beef. Pork. Lamb. Poultry with skin. Hot dogs. Berniece Salines. Dairy Ice cream. Sour cream. Whole milk. Fats and oils Butter. Canola oil. Vegetable oil. Beef fat (tallow). Lard. Beverages Juice. Sugar-sweetened soft drinks. Beer. Liquor and spirits. Sweets and desserts Cookies. Cakes. Pies. Candy. Seasonings and condiments Mayonnaise. Pre-made sauces and marinades. The items listed above  may not be a complete list of foods and beverages you should limit. Contact a dietitian for more information. Summary The Mediterranean diet includes both food and lifestyle choices. Eat a variety of fresh fruits and vegetables, beans, nuts, seeds, and whole grains. Limit the amount of red meat and sweets that you  eat. If recommended by your health care provider, drink red wine in moderation. This means 1 glass a day for nonpregnant women and 2 glasses a day for men. A glass of wine equals 5 oz (150 mL). This information is not intended to replace advice given to you by your health care provider. Make sure you discuss any questions you have with your health care provider. Document Revised: 11/21/2019 Document Reviewed: 09/18/2019 Elsevier Patient Education  2022 [employer].

## 2021-11-30 ENCOUNTER — Encounter: Payer: Self-pay | Admitting: Adult Health

## 2021-11-30 ENCOUNTER — Other Ambulatory Visit: Payer: Self-pay

## 2021-11-30 ENCOUNTER — Ambulatory Visit (INDEPENDENT_AMBULATORY_CARE_PROVIDER_SITE_OTHER): Payer: Medicare Other | Admitting: Adult Health

## 2021-11-30 VITALS — BP 114/69 | HR 71

## 2021-11-30 DIAGNOSIS — I63412 Cerebral infarction due to embolism of left middle cerebral artery: Secondary | ICD-10-CM

## 2021-11-30 NOTE — Patient Instructions (Signed)
Continue aspirin 81 mg daily  and atorvastatin for secondary stroke prevention  Continue use of plavix is not needed from a stroke standpoint but may be needed from a cardiology perspective - please ensure you discuss ongoing use of Plavix at follow up visit  Continue to follow up with PCP regarding cholesterol and blood pressure management  Maintain strict control of hypertension with blood pressure goal below 130/90 and cholesterol with LDL cholesterol (bad cholesterol) goal below 70 mg/dL.   Signs of a Stroke? Follow the BEFAST method:  Balance Watch for a sudden loss of balance, trouble with coordination or vertigo Eyes Is there a sudden loss of vision in one or both eyes? Or double vision?  Face: Ask the person to smile. Does one side of the face droop or is it numb?  Arms: Ask the person to raise both arms. Does one arm drift downward? Is there weakness or numbness of a leg? Speech: Ask the person to repeat a simple phrase. Does the speech sound slurred/strange? Is the person confused ? Time: If you observe any of these signs, call 911.        Thank you for coming to see [place] at [facility]. I hope we have been able to provide you high quality care today.  You may receive a patient satisfaction survey over the next few weeks. We would appreciate your feedback and comments so that we may continue to improve ourselves and the health of our patients.

## 2021-11-30 NOTE — Progress Notes (Signed)
[facility] [address] [name]. [place] [address] [phone]       STROKE FOLLOW UP NOTE  Brad Harrell Date of Birth:  06/30/1946 Medical Record Number:  [id number]   Reason for Referral: stroke follow up    SUBJECTIVE:   CHIEF COMPLAINT:  Chief Complaint  Patient presents with   Follow-up    Rm 2 with daughter [name] Pt is well and stable, no new concerns. Daughter states he is improving great     HPI:   Update 11/30/2021 JM: Returns for 54-monthstroke follow-up accompanied by his daughter.  Overall doing well without new stroke/TIA symptoms.  Reports residual slight right hand weakness but overall greatly improving.  Ambulating with RW further distances than prior but still using w/c for long distance. Able to maintain majority of ADLs independently, daughter assists with most IADLs.  Currently on aspirin, Plavix and atorvastatin without side effects.  Blood pressure today 114/69.  Monitors at home and has been stable.  Routinely followed by PCP and cardiology.  No further neurological concerns at this time.    History provided for reference purposes only Initial visit 06/01/2021 JM: [name] being seen for hospital follow up accompanied by his daughter who provides majority of history. Returned home with daughter last week -[facility]. Ambulating with RW - WC long distance. Residual right sided weakness and some word finding difficulty.  Able to maintain ADLs with only minimal assistance.  Denies new stroke/TIA symptoms.  Compliant on aspirin and Plavix as well as atorvastatin without associated side effects.  Blood pressure today 103/62 - routinely monitors at home and has been stable.  Routinely follows with cardiology and completed cardiac monitor which did not show evidence of A. fib.  No concerns at this time.  Stroke admission 03/30/2021 Brad Harrell a 76yo male with PMHx of HTN, HLD,  DM, prior strokes, hypothyroidism, chronic systolic and diastolic HF (dx'd 58/9373, cardiomyopathy, history of cocaine and tobacco use, s/p TURP and orchiectomy due to orchitis and pyocele on 03/25/2021 - [facility]. Presented via EMS to the ED for AMS on 04/08/2021. After bedding the patient in the ED, staff noted global aphasia with right sided hemiparesis and a code stroke was called.  Personally reviewed hospitalization pertinent progress notes, lab work and imaging summary provided. NIHSS = 17. 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  TEE EF 35% without evidence of thrombus or PFO.  LDL 53.  A1c 5.3.  Recommended DAPT for 3 months and aspirin alone.  Plans for further cardiology work-up outpatient for ischemic evaluation and possible ICD device due to reduced LVEF.  PT/OT/SLP recommended returning to SNF.       ROS:   14 system review of systems performed and negative with exception of those listed in HPI  PMH:  Past Medical History:  Diagnosis Date   Anemia    Iron Defficiency   Cardiomyopathy ([facility]    Combined systolic and diastolic congestive heart failure ([place]    Diabetes mellitus without complication ([facility]    TYPE 2   Foley catheter in place    SINCE NOV 2017   H/O cocaine abuse ([place]    Hyperlipidemia    Hypertension  Hypothyroidism    Liver abscess    measuring 7 x 9 cm/notes 03/04/2013   Stroke (Brad Harrell) 2010   WEAK ON BOTH SIDES NOW TOTAL OF 3 STROKES    Urinary retention     PSH:  Past Surgical History:  Procedure Laterality Date   APPENDECTOMY     BUBBLE STUDY  04/14/2021   Procedure: BUBBLE STUDY;  Surgeon: [name], MD;  Location: [facility] ENDOSCOPY;  Service: Cardiology;;   COLONOSCOPY N/A 03/11/2013   Procedure: COLONOSCOPY;  Surgeon: Beryle Beams, MD;   Location: [place];  Service: Endoscopy;  Laterality: N/A;   COLONOSCOPY WITH PROPOFOL N/A 03/15/2018   Procedure: COLONOSCOPY WITH PROPOFOL;  Surgeon: Carol Ada, MD;  Location: [facility] ENDOSCOPY;  Service: Endoscopy;  Laterality: N/A;   CYSTOSCOPY WITH INSERTION OF UROLIFT N/A 01/04/2017   Procedure: CYSTOSCOPY WITH INSERTION OF UROLIFT x8;  Surgeon: Cleon Gustin, MD;  Location: [facility] ORS;  Service: Urology;  Laterality: N/A;   ESOPHAGOGASTRODUODENOSCOPY N/A 03/11/2013   Procedure: ESOPHAGOGASTRODUODENOSCOPY (EGD);  Surgeon: Beryle Beams, MD;  Location: [facility] ENDOSCOPY;  Service: Endoscopy;  Laterality: N/A;   GIVENS CAPSULE STUDY N/A 03/12/2013   Procedure: GIVENS CAPSULE STUDY;  Surgeon: Beryle Beams, MD;  Location: [place];  Service: Endoscopy;  Laterality: N/A;   INGUINAL HERNIA REPAIR Right    IR CT HEAD LTD  04/08/2021   IR PERCUTANEOUS ART THROMBECTOMY/INFUSION INTRACRANIAL INC DIAG ANGIO  04/08/2021   POLYPECTOMY  03/15/2018   Procedure: POLYPECTOMY;  Surgeon: Carol Ada, MD;  Location: [facility] ENDOSCOPY;  Service: Endoscopy;;   RADIOLOGY WITH ANESTHESIA N/A 04/08/2021   Procedure: IR WITH ANESTHESIA;  Surgeon: Radiologist, Medication, MD;  Location: [place];  Service: Radiology;  Laterality: N/A;   SCROTAL EXPLORATION Left 03/25/2021   Procedure: LEFT SCROTUM EXPLORATION WITH ORCHIECTOMY;  Surgeon: [name], MD;  Location: [facility];  Service: Urology;  Laterality: Left;   TEE WITHOUT CARDIOVERSION N/A 04/14/2021   Procedure: TRANSESOPHAGEAL ECHOCARDIOGRAM (TEE);  Surgeon: [name], MD;  Location: [place];  Service: Cardiology;  Laterality: N/A;   TIBIA FRACTURE SURGERY Left    TRANSURETHRAL RESECTION OF PROSTATE N/A 03/25/2021   Procedure: CYSTOSCOPY TRANSURETHRAL RESECTION OF THE PROSTATE (TURP);  Surgeon: [name], MD;  Location: [facility];  Service: Urology;  Laterality: N/A;    Social History:  Social History   Socioeconomic History   Marital status: Widowed     Spouse name: Not on file   Number of children: Not on file   Years of education: Not on file   Highest education level: Not on file  Occupational History   Not on file  Tobacco Use   Smoking status: Former    Packs/day: 1.00    Years: 51.00    Pack years: 51.00    Types: Cigarettes   Smokeless tobacco: Never   Tobacco comments:    QUIT NOV 2010  Vaping Use   Vaping Use: Never used  Substance and Sexual Activity   Alcohol use: No    Comment: NOV 2010   Drug use: Yes    Types: Cocaine    Comment: NOV 2010 LAST USED   Sexual activity: Not Currently  Other Topics Concern   Not on file  Social History Narrative   Not on file   Social Determinants of Health   Financial Resource Strain: Not on file  Food Insecurity: Not on file  Transportation Needs: Not on file  Physical Activity: Not on file  Stress: Not on  file  Social Connections: Not on file  Intimate Partner Violence: Not on file    Family History:  Family History  Problem Relation Age of Onset   Hypertension Mother    Hypertension Father    Diabetes type II Father    Hypertension Brother     Medications:   Current Outpatient Medications on File Prior to Visit  Medication Sig Dispense Refill   aspirin EC 81 MG tablet Take 81 mg by mouth daily. Swallow whole.     atorvastatin (LIPITOR) 20 MG tablet TAKE ONE TABLET BY MOUTH DAILY 30 tablet 0   carvedilol (COREG) 6.25 MG tablet Take 1 tablet (6.25 mg total) by mouth 2 (two) times daily. 180 tablet 3   clopidogrel (PLAVIX) 75 MG tablet Take 75 mg by mouth daily.     ergocalciferol (VITAMIN D2) 1.25 MG (50000 UT) capsule Take 50,000 Units by mouth once a week.     finasteride (PROSCAR) 5 MG tablet Take 5 mg by mouth daily at 12 noon.     furosemide (LASIX) 20 MG tablet Take 20 mg by mouth daily.     glucose blood (FREESTYLE LITE) test strip Use as instructed 100 each 12   glucose monitoring kit (FREESTYLE) monitoring kit 1 each by Does not apply route as needed  for other. For blood sugar check 1 each 0   Lancets 28G MISC To check blood sugar three times daily with meals and at bedtime 120 each 3   levothyroxine (SYNTHROID, LEVOTHROID) 100 MCG tablet Take 100 mcg by mouth daily before breakfast.      sacubitril-valsartan (ENTRESTO) 24-26 MG Take 1 tablet by mouth 2 (two) times daily.     spironolactone (ALDACTONE) 25 MG tablet Take 0.5 tablets (12.5 mg total) by mouth daily. 45 tablet 3   No current facility-administered medications on file prior to visit.    Allergies:   Allergies  Allergen Reactions   Rocephin [Ceftriaxone] Nausea And Vomiting      OBJECTIVE:  Physical Exam  Vitals:   11/30/21 1036  BP: 114/69  Pulse: 71   There is no height or weight on file to calculate BMI. No results found.  General: well developed, well nourished, very pleasant elderly African-American male seated, in no evident distress Head: head normocephalic and atraumatic.   Neck: supple with no carotid or supraclavicular bruits Cardiovascular: regular rate and rhythm, no murmurs Musculoskeletal: no deformity Skin:  no rash/petichiae Vascular:  Normal pulses all extremities   Neurologic Exam Mental Status: Awake and fully alert.  Mild dysarthria (possibly due to poor denture), unable to appreciate word finding difficulty. Recent memory impaired and remote memory intact. Attention span, concentration and fund of knowledge diminished. Mood and affect appropriate.  Cranial Nerves: Chronic OD blindness.  Extraocular movements full without nystagmus. OS visual fields full to confrontation.  HOH bilaterally. Facial sensation intact. Face, tongue, palate moves normally and symmetrically.  Motor: Normal bulk and tone. Normal strength in all tested extremity muscles except slight right hand grip strength Sensory.: intact to touch , pinprick , position and vibratory sensation.  Coordination: Rapid alternating movements normal in all extremities. Finger-to-nose and  heel-to-shin performed accurately bilaterally.   Gait and Station: Deferred- RW not present Reflexes: 1+ and symmetric. Toes downgoing.          ASSESSMENT: KA FLAMMER is a 76 y.o. year old male with left MCA stroke in setting of M2 branch occlusion on 04/08/2021 s/p IR with TICI 3 reperfusion, embolic secondary to  atheroembolism vs cardioembolic with reduced EF. Vascular risk factors include HTN, HLD, DM, hx of prior strokes with one resulting in OD blindness, hx of cocaine and tobacco abuse and chronic systolic HF (dx'd 05/4107), .      PLAN:  Left MCA stroke:  Residual deficit: Mild right hand weakness, and mild cognitive impairment.  Overall greatly recovered with only minimal residual deficits.  Continue to use of RW for fall prevention Cardiac monitor negative for atrial fibrillation continue aspirin 81 mg daily  and atorvastatin for secondary stroke prevention.  Remains on Plavix as well per cardiology recommendations but not indicated from neurological/stroke standpoint Discussed secondary stroke prevention measures and importance of close PCP follow up for aggressive stroke risk factor management. I have gone over the pathophysiology of stroke, warning signs and symptoms, risk factors and their management in some detail with instructions to go to the closest emergency room for symptoms of concern. HTN: BP goal <130/90.  Stable on current regimen per PCP HLD: LDL goal <70. Prior  LDL 53 on atorvastatin 20 mg daily per PCP. 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 On metformin routine monitoring per PCP which has been stable per daughter (unable to view labs via epic)    Overall stable from stroke standpoint with no further recommendations.  Follow-up as needed   CC:  PCP: Nolene Ebbs, MD    I spent 34 minutes of face-to-face and non-face-to-face time with patient and daughter.  This included previsit chart review, lab review,  study review, electronic health record documentation, patient and daughter education regarding prior stroke with minimal residual deficits, secondary stroke prevention measures and importance of managing stroke risk factors as noted above and answered all questions to patient and daughters satisfaction  Frann Rider, [facility]  [facility] [address] [place], [place] [address]  Phone [phone] Fax [phone] Note: This document was prepared with digital dictation and possible smart phrase technology. Any transcriptional errors that result from this process are unintentional.

## 2021-12-22 NOTE — Progress Notes (Signed)
Cardiology Office Note   Date:  12/23/2021   ID:  Brad, Harrell [birth year], MRN [id number]  PCP:  Brad Ebbs, MD  Cardiologist:   Brad Breeding, MD   Chief Complaint  Patient presents with   Cardiomyopathy       History of Present Illness: Brad Harrell is a 76 y.o. male who presents for evaluation of diastolic HF.  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  Cardiology service was consulted due to his chronic combined heart failure and suspicion for cardioembolic CVA.  Previous echocardiogram obtained on 03/24/2021 showed EF 25 to 30%.  During this admission, he was placed on appropriate heart failure medication and recommend outpatient follow-up with cardiology service.  Unfortunately, he went back to the hospital with recurrent CVA.  MRI of the brain confirmed acute cortical infarction of the left MCA distribution.  TTE with bubble study performed on 04/11/2021 showed EF 25 to 30%, severe LV dysfunction, LV global hypokinesis, grade 1 DD, mild LVH.  He was placed on 325 mg daily of aspirin and a 75 mg daily of Plavix by neurology service.  We have been titrating his medications.  His last ejection fraction was 30 to 35% in Nov 2021.  This was slightly improved. He did have a perfusion study.  This demonstrated an old inferior infarct but no ischemia and he was managed medically.    Since then he says he is feeling okay.  He gets around the house with a walker and goes in his wheelchair if he has to go long distance. The patient denies any new symptoms such as chest discomfort, neck or arm discomfort. There has been no new shortness of breath, PND or orthopnea. There have been no reported palpitations, presyncope or syncope.    Past Medical History:  Diagnosis Date   Anemia    Iron Defficiency   Cardiomyopathy ([place])    Combined systolic and diastolic congestive heart failure (HCC)    Diabetes mellitus without complication ([place])     TYPE 2   Foley catheter in place    SINCE NOV 2017   H/O cocaine abuse ([place])    Hyperlipidemia    Hypertension    Hypothyroidism    Liver abscess    measuring 7 x 9 cm/notes 03/04/2013   Stroke (Brad Harrell) 2010   WEAK ON BOTH SIDES NOW TOTAL OF 3 STROKES    Urinary retention     Past Surgical History:  Procedure Laterality Date   APPENDECTOMY     BUBBLE STUDY  04/14/2021   Procedure: BUBBLE STUDY;  Surgeon: Brad Munroe, MD;  Location: [facility] ENDOSCOPY;  Service: Cardiology;;   COLONOSCOPY N/A 03/11/2013   Procedure: COLONOSCOPY;  Surgeon: Brad Beams, MD;  Location: [place];  Service: Endoscopy;  Laterality: N/A;   COLONOSCOPY WITH PROPOFOL N/A 03/15/2018   Procedure: COLONOSCOPY WITH PROPOFOL;  Surgeon: Brad Ada, MD;  Location: [facility] ENDOSCOPY;  Service: Endoscopy;  Laterality: N/A;   CYSTOSCOPY WITH INSERTION OF UROLIFT N/A 01/04/2017   Procedure: CYSTOSCOPY WITH INSERTION OF UROLIFT x8;  Surgeon: Brad Gustin, MD;  Location: [facility] ORS;  Service: Urology;  Laterality: N/A;   ESOPHAGOGASTRODUODENOSCOPY N/A 03/11/2013   Procedure: ESOPHAGOGASTRODUODENOSCOPY (EGD);  Surgeon: Brad Beams, MD;  Location: [facility] ENDOSCOPY;  Service: Endoscopy;  Laterality: N/A;   GIVENS CAPSULE STUDY N/A 03/12/2013   Procedure: GIVENS CAPSULE STUDY;  Surgeon: Brad Beams, MD;  Location: [place];  Service: Endoscopy;  Laterality: N/A;   INGUINAL HERNIA REPAIR Right    IR CT HEAD LTD  04/08/2021   IR PERCUTANEOUS ART THROMBECTOMY/INFUSION INTRACRANIAL INC DIAG ANGIO  04/08/2021   POLYPECTOMY  03/15/2018   Procedure: POLYPECTOMY;  Surgeon: Brad Ada, MD;  Location: [facility] ENDOSCOPY;  Service: Endoscopy;;   RADIOLOGY WITH ANESTHESIA N/A 04/08/2021   Procedure: IR WITH ANESTHESIA;  Surgeon: Radiologist, Medication, MD;  Location: [place];  Service: Radiology;  Laterality: N/A;   SCROTAL EXPLORATION Left 03/25/2021   Procedure: LEFT SCROTUM EXPLORATION WITH ORCHIECTOMY;  Surgeon: Brad Seal, MD;   Location: [facility] ORS;  Service: Urology;  Laterality: Left;   TEE WITHOUT CARDIOVERSION N/A 04/14/2021   Procedure: TRANSESOPHAGEAL ECHOCARDIOGRAM (TEE);  Surgeon: Brad Munroe, MD;  Location: [place];  Service: Cardiology;  Laterality: N/A;   TIBIA FRACTURE SURGERY Left    TRANSURETHRAL RESECTION OF PROSTATE N/A 03/25/2021   Procedure: CYSTOSCOPY TRANSURETHRAL RESECTION OF THE PROSTATE (TURP);  Surgeon: Brad Seal, MD;  Location: [facility]S;  Service: Urology;  Laterality: N/A;     Current Outpatient Medications  Medication Sig Dispense Refill   aspirin EC 81 MG tablet Take 81 mg by mouth daily. Swallow whole.     atorvastatin (LIPITOR) 20 MG tablet TAKE ONE TABLET BY MOUTH DAILY 30 tablet 0   carvedilol (COREG) 6.25 MG tablet Take 1 tablet (6.25 mg total) by mouth 2 (two) times daily. 180 tablet 3   clopidogrel (PLAVIX) 75 MG tablet Take 75 mg by mouth daily.     ergocalciferol (VITAMIN D2) 1.25 MG (50000 UT) capsule Take 50,000 Units by mouth once a week.     finasteride (PROSCAR) 5 MG tablet Take 5 mg by mouth daily at 12 noon.     furosemide (LASIX) 20 MG tablet Take 20 mg by mouth daily.     glucose blood (FREESTYLE LITE) test strip Use as instructed 100 each 12   glucose monitoring kit (FREESTYLE) monitoring kit 1 each by Does not apply route as needed for other. For blood sugar check 1 each 0   Lancets 28G MISC To check blood sugar three times daily with meals and at bedtime 120 each 3   levothyroxine (SYNTHROID, LEVOTHROID) 100 MCG tablet Take 100 mcg by mouth daily before breakfast.      sacubitril-valsartan (ENTRESTO) 49-51 MG Take 1 tablet by mouth 2 (two) times daily. 60 tablet 1   spironolactone (ALDACTONE) 25 MG tablet Take 0.5 tablets (12.5 mg total) by mouth daily. 45 tablet 3   No current facility-administered medications for this visit.    Allergies:   Rocephin [ceftriaxone]   ROS:  Please see the history of present illness.   Otherwise, review of systems are  positive for none.   All other systems are reviewed and negative.    PHYSICAL EXAM: VS:  BP 114/64    Pulse 81    Ht 5' 4"  (1.626 m)    Wt 173 lb (78.5 kg)    SpO2 95%    BMI 29.70 kg/m  , BMI Body mass index is 29.7 kg/m. GEN:  No distress NECK:  Positive  jugular venous distention at 90 degrees, waveform within normal limits, carotid upstroke brisk and symmetric, no bruits, no thyromegaly LYMPHATICS:  No cervical adenopathy LUNGS:  Clear to auscultation bilaterally BACK:  No CVA tenderness CHEST:  Unremarkable HEART:  S1 and S2 within normal limits, no S3, no S4, no clicks, no rubs, no murmurs ABD:  Positive bowel sounds normal in  frequency in pitch, no bruits, no rebound, no guarding, unable to assess midline mass or bruit with the patient seated. EXT:  2 plus pulses throughout, moderate edema, no cyanosis no clubbing SKIN:  No rashes no nodules NEURO:  Cranial nerves II through XII grossly intact, motor grossly intact throughout PSYCH:  Cognitively intact, oriented to person place and time   EKG:  EKG is not ordered today.     Recent Labs: 03/27/2021: Magnesium 1.9 04/08/2021: ALT 10; TSH 4.825 04/15/2021: Hemoglobin 9.0; Platelets 212 06/10/2021: BUN 27; Creatinine, Ser 0.95; Potassium 4.6; Sodium 141    Lipid Panel    Component Value Date/Time   CHOL 108 04/09/2021 0500   TRIG 183 (H) 04/09/2021 0500   HDL 18 (L) 04/09/2021 0500   CHOLHDL 6.0 04/09/2021 0500   VLDL 37 04/09/2021 0500   LDLCALC 53 04/09/2021 0500      Wt Readings from Last 3 Encounters:  12/23/21 173 lb (78.5 kg)  09/16/21 166 lb (75.3 kg)  06/16/21 162 lb (73.5 kg)      Other studies Reviewed: Additional studies/ records that were reviewed today include: None Review of the above records demonstrates:  Please see elsewhere in the note.     ASSESSMENT AND PLAN:  Chronic combined systolic and diastolic heart failure: Today I will increase his Entresto to 49/51 twice daily.  We will see him  back in 6 weeks or so for med titration  Hypertension:    This is being managed in the context of treating his CHF   Hyperlipidemia: LDL was 53.  No change.    DM2: A1c was 5.3  History of CVA:   He has mild aphasia and hemiparesis.  Continue meds as listed.   Current medicines are reviewed at length with the patient today.  The patient does not have concerns regarding medicines.  The following changes have been made:   As above  Labs/ tests ordered today include:     No orders of the defined types were placed in this encounter.     Disposition:   FU with APP in about six weeks.    Signed, Brad Breeding, MD  12/23/2021 10:03 AM    [facility]

## 2021-12-23 ENCOUNTER — Encounter: Payer: Self-pay | Admitting: Cardiology

## 2021-12-23 ENCOUNTER — Ambulatory Visit: Payer: Medicare Other | Admitting: Cardiology

## 2021-12-23 ENCOUNTER — Ambulatory Visit (INDEPENDENT_AMBULATORY_CARE_PROVIDER_SITE_OTHER): Payer: Medicare Other | Admitting: Cardiology

## 2021-12-23 ENCOUNTER — Other Ambulatory Visit: Payer: Self-pay

## 2021-12-23 VITALS — BP 114/64 | HR 81 | Ht 64.0 in | Wt 173.0 lb

## 2021-12-23 DIAGNOSIS — I63412 Cerebral infarction due to embolism of left middle cerebral artery: Secondary | ICD-10-CM | POA: Diagnosis not present

## 2021-12-23 DIAGNOSIS — I10 Essential (primary) hypertension: Secondary | ICD-10-CM | POA: Diagnosis not present

## 2021-12-23 DIAGNOSIS — I5042 Chronic combined systolic (congestive) and diastolic (congestive) heart failure: Secondary | ICD-10-CM

## 2021-12-23 DIAGNOSIS — E785 Hyperlipidemia, unspecified: Secondary | ICD-10-CM | POA: Diagnosis not present

## 2021-12-23 MED ORDER — ENTRESTO 49-51 MG PO TABS: 1.0000 | ORAL_TABLET | Freq: Two times a day (BID) | ORAL | 1 refills | Status: DC

## 2021-12-23 NOTE — Patient Instructions (Signed)
Medication Instructions:  INCREASE ENTRESTO to 49/51mg  TWICE DAILY  *If you need a refill on your cardiac medications before your next appointment, please call your pharmacy*  Follow-Up: At [facility], you and your health needs are our priority.  As part of our continuing mission to provide you with exceptional heart care, we have created designated Provider Care Teams.  These Care Teams include your primary Cardiologist (physician) and Advanced Practice Providers (APPs -  Physician Assistants and Nurse Practitioners) who all work together to provide you with the care you need, when you need it.  We recommend signing up for the patient portal called "MyChart".  Sign up information is provided on this After Visit Summary.  MyChart is used to connect with patients for Virtual Visits (Telemedicine).  Patients are able to view lab/test results, encounter notes, upcoming appointments, etc.  Non-urgent messages can be sent to your provider as well.   To learn more about what you can do with MyChart, go to [email or web address].    Your next appointment:   6 week(s)  The format for your next appointment:   In Person  Provider:   Almyra Deforest PA-C

## 2022-02-01 ENCOUNTER — Other Ambulatory Visit: Payer: Self-pay | Admitting: Internal Medicine

## 2022-02-02 LAB — CBC
HCT: 36.9 % — ABNORMAL LOW (ref 38.5–50.0)
Hemoglobin: 12.3 g/dL — ABNORMAL LOW (ref 13.2–17.1)
MCH: 29.4 pg (ref 27.0–33.0)
MCHC: 33.3 g/dL (ref 32.0–36.0)
MCV: 88.1 fL (ref 80.0–100.0)
MPV: 10.6 fL (ref 7.5–12.5)
Platelets: 226 10*3/uL (ref 140–400)
RBC: 4.19 10*6/uL — ABNORMAL LOW (ref 4.20–5.80)
RDW: 11.6 % (ref 11.0–15.0)
WBC: 7.3 10*3/uL (ref 3.8–10.8)

## 2022-02-02 LAB — TSH: TSH: 1.28 mIU/L (ref 0.40–4.50)

## 2022-02-02 LAB — COMPLETE METABOLIC PANEL WITH GFR
AG Ratio: 1.3 (calc) (ref 1.0–2.5)
ALT: 9 U/L (ref 9–46)
AST: 16 U/L (ref 10–35)
Albumin: 4.1 g/dL (ref 3.6–5.1)
Alkaline phosphatase (APISO): 53 U/L (ref 35–144)
BUN/Creatinine Ratio: 18 (calc) (ref 6–22)
BUN: 24 mg/dL (ref 7–25)
CO2: 22 mmol/L (ref 20–32)
Calcium: 9.8 mg/dL (ref 8.6–10.3)
Chloride: 103 mmol/L (ref 98–110)
Creat: 1.31 mg/dL — ABNORMAL HIGH (ref 0.70–1.28)
Globulin: 3.2 g/dL (calc) (ref 1.9–3.7)
Glucose, Bld: 238 mg/dL — ABNORMAL HIGH (ref 65–99)
Potassium: 4.4 mmol/L (ref 3.5–5.3)
Sodium: 138 mmol/L (ref 135–146)
Total Bilirubin: 0.6 mg/dL (ref 0.2–1.2)
Total Protein: 7.3 g/dL (ref 6.1–8.1)
eGFR: 56 mL/min/{1.73_m2} — ABNORMAL LOW (ref >=60)

## 2022-02-02 LAB — LIPID PANEL
Cholesterol: 136 mg/dL (ref <200)
HDL: 27 mg/dL — ABNORMAL LOW (ref >=40)
LDL Cholesterol (Calc): 77 mg/dL (calc)
Non-HDL Cholesterol (Calc): 109 mg/dL (calc) (ref <130)
Total CHOL/HDL Ratio: 5 (calc) — ABNORMAL HIGH (ref <5.0)
Triglycerides: 227 mg/dL — ABNORMAL HIGH (ref <150)

## 2022-02-02 LAB — T4, FREE: Free T4: 1.6 ng/dL (ref 0.8–1.8)

## 2022-02-05 NOTE — Progress Notes (Signed)
?Cardiology Office Note:   ? ?Date:  02/09/2022  ? ?ID:  Brad Harrell, DOB [birth year], MRN [id number] ? ?PCP:  Nolene Ebbs, MD  ?Cardiologist:  Minus Breeding, MD  ?Electrophysiologist:  None  ? ?Referring MD: Nolene Ebbs, MD  ? ?Chief Complaint: follow-up of CHF ? ?History of Present Illness:   ? ?Brad Harrell is a 76 y.o. male with a history of presumed CAD with abnormal Myoview in 05/2021, chronic combined CHF with EF of 30-35% on Echo in 08/2021,  recurrent CVA, hypertension, hyperlipidemia, type 2 diabetes, hypothyroidism, iron deficiency anemia, liver abscess, and prior cocaine abuse who is followed by Dr. Percival Spanish and presents today for follow-up of CHF. ? ?Patient has a long history of chronic systolic CHF.  Echo in 2015 showed LVEF of 30-35%.  He was seen by Cardiology during an admission in 02/2021 after having not being seen for several years. He was admitted in 02/2021 for sepsis secondary to left epididymoorchitis with abscess s/p left scrotal exploration with left orchiectomy and drainage of pyelocele and a TURP.  Echo during that admission showed LVEF of 25-30% with global hypokinesis, normal RV with mildly reduced systolic function, and mild dilatation of the ascending aorta measuring 42 mm.  He was started on GDMT with plans for outpatient ischemia evaluation.  Before he could be seen for follow-up, he was readmitted in 03/2021 for acute CVA.  He is not a candidate for tPA and was taken urgently to INR for thrombectomy.  TTE with bubble study showed LVEF of 25-30% with global hypokinesis, mild LVH, and grade 1 diastolic dysfunction but no evidence of apical thrombus or intra-atrial shunt.  Neurology recommended DAPT with aspirin and Plavix for 90 days.  Outpatient monitor showed no evidence of atrial fibrillation.  He ultimately underwent a Myoview in 05/2021 which was high risk with a large inferior wall defect from apex to base consistent with prior MI but no ischemia.  Given absence of  ischemia, medical therapy was recommended.  GDMT was adjusted.  Repeat echo in 08/2021 showed LVEF of 30-35% with global hypokinesis, grade 1 diastolic dysfunction as well as mild dilatation of the aortic root measuring 37 mm. ? ?Patient was last seen by Dr. Percival Spanish in 11/2021 at which time he was doing okay and denied any new symptoms.  Brad Harrell was increased and he was advised to follow-up in 6 weeks for additional med titration. ? ?Patient presents today for follow-up. Here with daughter. Patient is doing very well from a cardiac standpoint. He denies any chest pain, shortness of breath, orthopnea, PND, edema, palpitaitons, lightheadedness, dizziness, syncope. He is compliant with all his medications and is tolerating them well. Weight is down 17 lbs since last visit. He is in a wheelchair today but states he only uses this when he goes to doctor appointments. He usually ambulates with a walker.  ? ?Past Medical History:  ?Diagnosis Date  ? Anemia   ? Iron Defficiency  ? Cardiomyopathy ([place])   ? Combined systolic and diastolic congestive heart failure ([place])   ? Diabetes mellitus without complication ([place])   ? TYPE 2  ? Foley catheter in place   ? SINCE NOV 2017  ? H/O cocaine abuse ([place])   ? Hyperlipidemia   ? Hypertension   ? Hypothyroidism   ? Liver abscess   ? measuring 7 x 9 cm/notes 03/04/2013  ? Stroke [facility]) 2010  ? WEAK ON BOTH SIDES NOW TOTAL OF 3 STROKES   ? Urinary retention   ? ? ?  Past Surgical History:  ?Procedure Laterality Date  ? APPENDECTOMY    ? BUBBLE STUDY  04/14/2021  ? Procedure: BUBBLE STUDY;  Surgeon: [name], MD;  Location: [place];  Service: Cardiology;;  ? COLONOSCOPY N/A 03/11/2013  ? Procedure: COLONOSCOPY;  Surgeon: Beryle Beams, MD;  Location: [place];  Service: Endoscopy;  Laterality: N/A;  ? COLONOSCOPY WITH PROPOFOL N/A 03/15/2018  ? Procedure: COLONOSCOPY WITH PROPOFOL;  Surgeon: Carol Ada, MD;  [facility] ENDOSCOPY;  Service: Endoscopy;  Laterality: N/A;   ? CYSTOSCOPY WITH INSERTION OF UROLIFT N/A 01/04/2017  ? Procedure: CYSTOSCOPY WITH INSERTION OF UROLIFT x8;  Surgeon: Cleon Gustin, MD;  [facility];  Service: Urology;  Laterality: N/A;  ? ESOPHAGOGASTRODUODENOSCOPY N/A 03/11/2013  ? Procedure: ESOPHAGOGASTRODUODENOSCOPY (EGD);  Surgeon: Beryle Beams, MD;  Location: [facility] ENDOSCOPY;  Service: Endoscopy;  Laterality: N/A;  ? GIVENS CAPSULE STUDY N/A 03/12/2013  ? Procedure: GIVENS CAPSULE STUDY;  Surgeon: Beryle Beams, MD;  Location: [facility] ENDOSCOPY;  Service: Endoscopy;  Laterality: N/A;  ? INGUINAL HERNIA REPAIR Right   ? IR CT HEAD LTD  04/08/2021  ? IR PERCUTANEOUS ART THROMBECTOMY/INFUSION INTRACRANIAL INC DIAG ANGIO  04/08/2021  ? POLYPECTOMY  03/15/2018  ? Procedure: POLYPECTOMY;  Surgeon: Carol Ada, MD;  [facility] ENDOSCOPY;  Service: Endoscopy;;  ? RADIOLOGY WITH ANESTHESIA N/A 04/08/2021  ? Procedure: IR WITH ANESTHESIA;  Surgeon: Radiologist, Medication, MD;  Location: [place];  Service: Radiology;  Laterality: N/A;  ? SCROTAL EXPLORATION Left 03/25/2021  ? Procedure: LEFT SCROTUM EXPLORATION WITH ORCHIECTOMY;  Surgeon: [name], MD;  [facility];  Service: Urology;  Laterality: Left;  ? TEE WITHOUT CARDIOVERSION N/A 04/14/2021  ? Procedure: TRANSESOPHAGEAL ECHOCARDIOGRAM (TEE);  Surgeon: [name], MD;  Location: [place];  Service: Cardiology;  Laterality: N/A;  ? TIBIA FRACTURE SURGERY Left   ? TRANSURETHRAL RESECTION OF PROSTATE N/A 03/25/2021  ? Procedure: CYSTOSCOPY TRANSURETHRAL RESECTION OF THE PROSTATE (TURP);  Surgeon: [name], MD;  [facility];  Service: Urology;  Laterality: N/A;  ? ? ?Current Medications: ?Current Meds  ?Medication Sig  ? aspirin EC 81 MG tablet Take 81 mg by mouth daily. Swallow whole.  ? atorvastatin (LIPITOR) 40 MG tablet Take 1 tablet (40 mg total) by mouth daily.  ? carvedilol (COREG) 6.25 MG tablet Take 1 tablet (6.25 mg total) by mouth 2 (two) times daily.  ? clopidogrel (PLAVIX) 75  MG tablet Take 75 mg by mouth daily.  ? ergocalciferol (VITAMIN D2) 1.25 MG (50000 UT) capsule Take 50,000 Units by mouth once a week.  ? finasteride (PROSCAR) 5 MG tablet Take 5 mg by mouth daily at 12 noon.  ? furosemide (LASIX) 20 MG tablet Take 20 mg by mouth daily.  ? glucose blood (FREESTYLE LITE) test strip Use as instructed  ? glucose monitoring kit (FREESTYLE) monitoring kit 1 each by Does not apply route as needed for other. For blood sugar check  ? Lancets 28G MISC To check blood sugar three times daily with meals and at bedtime  ? levothyroxine (SYNTHROID, LEVOTHROID) 100 MCG tablet Take 100 mcg by mouth daily before breakfast.   ? sacubitril-valsartan (ENTRESTO) 49-51 MG Take 1 tablet by mouth 2 (two) times daily.  ? spironolactone (ALDACTONE) 25 MG tablet Take 0.5 tablets (12.5 mg total) by mouth daily.  ? [DISCONTINUED] atorvastatin (LIPITOR) 20 MG tablet TAKE ONE TABLET BY MOUTH DAILY  ?  ? ?Allergies:   Rocephin [ceftriaxone]  ? ?Social History  ? ?  Socioeconomic History  ? Marital status: Widowed  ?  Spouse name: Not on file  ? Number of children: Not on file  ? Years of education: Not on file  ? Highest education level: Not on file  ?Occupational History  ? Not on file  ?Tobacco Use  ? Smoking status: Former  ?  Packs/day: 1.00  ?  Years: 51.00  ?  Pack years: 51.00  ?  Types: Cigarettes  ? Smokeless tobacco: Never  ? Tobacco comments:  ?  QUIT NOV 2010  ?Vaping Use  ? Vaping Use: Never used  ?Substance and Sexual Activity  ? Alcohol use: No  ?  Comment: NOV 2010  ? Drug use: Yes  ?  Types: Cocaine  ?  Comment: NOV 2010 LAST USED  ? Sexual activity: Not Currently  ?Other Topics Concern  ? Not on file  ?Social History Narrative  ? Not on file  ? ?Social Determinants of Health  ? ?Financial Resource Strain: Not on file  ?Food Insecurity: Not on file  ?Transportation Needs: Not on file  ?Physical Activity: Not on file  ?Stress: Not on file  ?Social Connections: Not on file  ?  ? ?Family History: ?The  patient's family history includes Diabetes type II in his father; Hypertension in his brother, father, and mother. ? ?ROS:   ?Please see the history of present illness.    ? ?EKGs/Labs/Other Studies Review

## 2022-02-09 ENCOUNTER — Ambulatory Visit (INDEPENDENT_AMBULATORY_CARE_PROVIDER_SITE_OTHER): Payer: Medicare Other | Admitting: Student

## 2022-02-09 ENCOUNTER — Encounter: Payer: Self-pay | Admitting: Student

## 2022-02-09 VITALS — BP 104/60 | HR 94 | Ht 63.0 in | Wt 156.7 lb

## 2022-02-09 DIAGNOSIS — I5042 Chronic combined systolic (congestive) and diastolic (congestive) heart failure: Secondary | ICD-10-CM | POA: Diagnosis not present

## 2022-02-09 DIAGNOSIS — I10 Essential (primary) hypertension: Secondary | ICD-10-CM

## 2022-02-09 DIAGNOSIS — E785 Hyperlipidemia, unspecified: Secondary | ICD-10-CM

## 2022-02-09 DIAGNOSIS — I2510 Atherosclerotic heart disease of native coronary artery without angina pectoris: Secondary | ICD-10-CM | POA: Diagnosis not present

## 2022-02-09 DIAGNOSIS — E118 Type 2 diabetes mellitus with unspecified complications: Secondary | ICD-10-CM

## 2022-02-09 MED ORDER — ATORVASTATIN CALCIUM 40 MG PO TABS: 40.0000 mg | ORAL_TABLET | Freq: Every day | ORAL | 3 refills | Status: DC

## 2022-02-09 NOTE — Patient Instructions (Signed)
Medication Instructions:  ?Increase Lipitor to 40 mg ( Take 1 Tablet Daily). ?*If you need a refill on your cardiac medications before your next appointment, please call your pharmacy* ? ? ?Lab Work: ?BMET Today ?Lipid panel, Hepatic Panel : In 2 months ?If you have labs (blood work) drawn today and your tests are completely normal, you will receive your results only by: ?MyChart Message (if you have MyChart) OR ?A paper copy in the mail ?If you have any lab test that is abnormal or we need to change your treatment, we will call you to review the results. ? ? ?Testing/Procedures: [address], [address]. ?Your physician has requested that you have an echocardiogram. Echocardiography is a painless test that uses sound waves to create images of your heart. It provides your doctor with information about the size and shape of your heart and how well your heart?s chambers and valves are working. This procedure takes approximately one hour. There are no restrictions for this procedure.  ? ? ?Follow-Up: ?At [facility], you and your health needs are our priority.  As part of our continuing mission to provide you with exceptional heart care, we have created designated Provider Care Teams.  These Care Teams include your primary Cardiologist (physician) and Advanced Practice Providers (APPs -  Physician Assistants and Nurse Practitioners) who all work together to provide you with the care you need, when you need it. ? ?We recommend signing up for the patient portal called "MyChart".  Sign up information is provided on this After Visit Summary.  MyChart is used to connect with patients for Virtual Visits (Telemedicine).  Patients are able to view lab/test results, encounter notes, upcoming appointments, etc.  Non-urgent messages can be sent to your provider as well.   ?To learn more about what you can do with MyChart, go to [email or web address].   ? ?Your next appointment:   ?6 month(s) ? ?The format for your  next appointment:   ?In Person ? ?Provider:   ?Minus Breeding, MD   ? ? ?Other Instructions ?Heart Failure Education: ? ?Weigh yourself EVERY morning after you go to the bathroom but before you eat or drink anything. Write this number down in a weight log/diary. If you gain 3 pounds overnight or 5 pounds in a week, call the office. ?Take your medicines as prescribed. If you have concerns about your medications, please call us before you stop taking them. ?Eat low salt foods--Limit salt (sodium) to 2000 mg per day. This will help prevent your body from holding onto fluid. Read food labels as many processed foods have a lot of sodium, especially canned goods and prepackaged meats. If you would like some assistance choosing low sodium foods, we would be happy to set you up with a nutritionist. ?Limit all fluids for the day to less than 2 liters (64 ounces). Fluid includes all drinks, coffee, juice, ice chips, soup, jello, and all other liquids. ?Stay as active as you can everyday. Staying active will give you more energy and make your muscles stronger. Start with 5 minutes at a time and work your way up to 30 minutes a day. Break up your activities--do some in the morning and some in the afternoon. Start with 3 days per week and work your way up to 5 days as you can.  If you have chest pain, feel short of breath, dizzy, or lightheaded, STOP. If you don't feel better after a short rest, call 911. If you do feel better,  call the office to let us know you have symptoms with exercise. ?  ? ?Important Information About Sugar ? ? ? ? ?  ?

## 2022-02-10 LAB — BASIC METABOLIC PANEL
BUN/Creatinine Ratio: 20 (ref 10–24)
BUN: 25 mg/dL (ref 8–27)
CO2: 17 mmol/L — ABNORMAL LOW (ref 20–29)
Calcium: 10 mg/dL (ref 8.6–10.2)
Chloride: 100 mmol/L (ref 96–106)
Creatinine, Ser: 1.22 mg/dL (ref 0.76–1.27)
Glucose: 181 mg/dL — ABNORMAL HIGH (ref 70–99)
Potassium: 4.7 mmol/L (ref 3.5–5.2)
Sodium: 140 mmol/L (ref 134–144)
eGFR: 61 mL/min/{1.73_m2} (ref >59)

## 2022-02-23 ENCOUNTER — Other Ambulatory Visit: Payer: Self-pay | Admitting: Cardiology

## 2022-02-24 ENCOUNTER — Ambulatory Visit (HOSPITAL_COMMUNITY): Payer: Medicare Other | Attending: Cardiovascular Disease

## 2022-02-24 DIAGNOSIS — E785 Hyperlipidemia, unspecified: Secondary | ICD-10-CM

## 2022-02-24 DIAGNOSIS — I10 Essential (primary) hypertension: Secondary | ICD-10-CM | POA: Diagnosis not present

## 2022-02-24 DIAGNOSIS — I5042 Chronic combined systolic (congestive) and diastolic (congestive) heart failure: Secondary | ICD-10-CM | POA: Diagnosis not present

## 2022-02-24 DIAGNOSIS — E118 Type 2 diabetes mellitus with unspecified complications: Secondary | ICD-10-CM | POA: Insufficient documentation

## 2022-02-24 DIAGNOSIS — I2510 Atherosclerotic heart disease of native coronary artery without angina pectoris: Secondary | ICD-10-CM | POA: Diagnosis not present

## 2022-02-24 LAB — ECHOCARDIOGRAM COMPLETE
Area-P 1/2: 3.28 cm2
S' Lateral: 3.2 cm

## 2022-03-22 NOTE — Progress Notes (Unsigned)
  Cardiology Office Note   Date:  03/23/2022   ID:  Brad Harrell, DOB 11/18/1945, MRN [id number]  PCP:  Harrell, Edwin, MD  Cardiologist:   James Hochrein, MD   Chief Complaint  Patient presents with   Cardiomyopathy       History of Present Illness: Brad Harrell is a 76 y.o. male who presents for evaluation of diastolic HF.  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  Cardiology service was consulted due to his chronic combined heart failure and suspicion for cardioembolic CVA.  Previous echocardiogram obtained on 03/24/2021 showed EF 25 to 30%.  During this admission, he was placed on appropriate heart failure medication and recommend outpatient follow-up with cardiology service.  Unfortunately, he went back to the hospital with recurrent CVA.  MRI of the brain confirmed acute cortical infarction of the left MCA distribution.  TTE with bubble study performed on 04/11/2021 showed EF 25 to 30%, severe LV dysfunction, LV global hypokinesis, grade 1 DD, mild LVH.  He was placed on 325 mg daily of aspirin and a 75 mg daily of Plavix by neurology service.  We have been titrating his medications.  His last ejection fraction was 30 to 35% in Nov 2021.  This was slightly improved. He did have a perfusion study.  This demonstrated an old inferior infarct but no ischemia and he was managed medically.    He comes in today and is doing well.  His ejection fraction on the follow-up echo done last month was still 30 to 35% and essentially unchanged.  He is actually doing well.  His Lasix has been reduced as his creatinine had been up previously.  He is not having any swelling.  He is not describing any new PND or orthopnea.  He had no new palpitations, presyncope or syncope.  He is here in a wheelchair like he has been but he does walk from church to the car.  He gets winded but he recovers quickly.  Past Medical History:  Diagnosis Date   Anemia    Iron  Defficiency   Cardiomyopathy (HCC)    Combined systolic and diastolic congestive heart failure (HCC)    Diabetes mellitus without complication (HCC)    TYPE 2   Foley catheter in place    SINCE NOV 2017   H/O cocaine abuse (HCC)    Hyperlipidemia    Hypertension    Hypothyroidism    Liver abscess    measuring 7 x 9 cm/notes 03/04/2013   Stroke (HCC) 2010   WEAK ON BOTH SIDES NOW TOTAL OF 3 STROKES    Urinary retention     Past Surgical History:  Procedure Laterality Date   APPENDECTOMY     BUBBLE STUDY  04/14/2021   Procedure: BUBBLE STUDY;  Surgeon: Acharya, Gayatri A, MD;  Location: [facility];  Service: Cardiology;;   COLONOSCOPY N/A 03/11/2013   Procedure: COLONOSCOPY;  Surgeon: Patrick D Hung, MD;  Location: [facility];  Service: Endoscopy;  Laterality: N/A;   COLONOSCOPY WITH PROPOFOL N/A 03/15/2018   Procedure: COLONOSCOPY WITH PROPOFOL;  Surgeon: Hung, Patrick, MD;  Location: [facility];  Service: Endoscopy;  Laterality: N/A;   CYSTOSCOPY WITH INSERTION OF UROLIFT N/A 01/04/2017   Procedure: CYSTOSCOPY WITH INSERTION OF UROLIFT x8;  Surgeon: Patrick L McKenzie, MD;  Location: [facility];  Service: Urology;  Laterality: N/A;   ESOPHAGOGASTRODUODENOSCOPY N/A 03/11/2013   Procedure: ESOPHAGOGASTRODUODENOSCOPY (EGD);  Surgeon: Patrick D Hung,   MD;  Location: [facility];  Service: Endoscopy;  Laterality: N/A;   GIVENS CAPSULE STUDY N/A 03/12/2013   Procedure: GIVENS CAPSULE STUDY;  Surgeon: Patrick D Hung, MD;  Location: [facility];  Service: Endoscopy;  Laterality: N/A;   INGUINAL HERNIA REPAIR Right    IR CT HEAD LTD  04/08/2021   IR PERCUTANEOUS ART THROMBECTOMY/INFUSION INTRACRANIAL INC DIAG ANGIO  04/08/2021   POLYPECTOMY  03/15/2018   Procedure: POLYPECTOMY;  Surgeon: Hung, Patrick, MD;  Location: [facility];  Service: Endoscopy;;   RADIOLOGY WITH ANESTHESIA N/A 04/08/2021   Procedure: IR WITH ANESTHESIA;  Surgeon: Radiologist, Medication, MD;  Location: [facility];  Service:  Radiology;  Laterality: N/A;   SCROTAL EXPLORATION Left 03/25/2021   Procedure: LEFT SCROTUM EXPLORATION WITH ORCHIECTOMY;  Surgeon: Wrenn, John, MD;  Location: [facility];  Service: Urology;  Laterality: Left;   TEE WITHOUT CARDIOVERSION N/A 04/14/2021   Procedure: TRANSESOPHAGEAL ECHOCARDIOGRAM (TEE);  Surgeon: Acharya, Gayatri A, MD;  Location: [facility];  Service: Cardiology;  Laterality: N/A;   TIBIA FRACTURE SURGERY Left    TRANSURETHRAL RESECTION OF PROSTATE N/A 03/25/2021   Procedure: CYSTOSCOPY TRANSURETHRAL RESECTION OF THE PROSTATE (TURP);  Surgeon: Wrenn, John, MD;  Location: [facility];  Service: Urology;  Laterality: N/A;     Current Outpatient Medications  Medication Sig Dispense Refill   aspirin EC 81 MG tablet Take 81 mg by mouth daily. Swallow whole.     atorvastatin (LIPITOR) 40 MG tablet Take 1 tablet (40 mg total) by mouth daily. 90 tablet 3   carvedilol (COREG) 12.5 MG tablet Take 1 tablet (12.5 mg total) by mouth 2 (two) times daily. 180 tablet 3   clopidogrel (PLAVIX) 75 MG tablet Take 75 mg by mouth daily.     ENTRESTO 49-51 MG Take 1 tablet by mouth twice daily 60 tablet 0   ergocalciferol (VITAMIN D2) 1.25 MG (50000 UT) capsule Take 50,000 Units by mouth once a week.     finasteride (PROSCAR) 5 MG tablet Take 5 mg by mouth daily at 12 noon.     furosemide (LASIX) 20 MG tablet Take 20 mg by mouth daily.     glucose blood (FREESTYLE LITE) test strip Use as instructed 100 each 12   glucose monitoring kit (FREESTYLE) monitoring kit 1 each by Does not apply route as needed for other. For blood sugar check 1 each 0   Lancets 28G MISC To check blood sugar three times daily with meals and at bedtime 120 each 3   levothyroxine (SYNTHROID, LEVOTHROID) 100 MCG tablet Take 100 mcg by mouth daily before breakfast.      spironolactone (ALDACTONE) 25 MG tablet Take 0.5 tablets (12.5 mg total) by mouth daily. 45 tablet 3   No current facility-administered medications for this visit.     Allergies:   Rocephin [ceftriaxone]   ROS:  Please see the history of present illness.   Otherwise, review of systems are positive for none.   All other systems are reviewed and negative.    PHYSICAL EXAM: VS:  BP 114/60   Pulse 83   Ht 5' 3" (1.6 m)   Wt 186 lb 6.4 oz (84.6 kg)   SpO2 90%   BMI 33.02 kg/m  , BMI Body mass index is 33.02 kg/m. GEN:  No distress NECK:  No jugular venous distention at 90 degrees, waveform within normal limits, carotid upstroke brisk and symmetric, no bruits, no thyromegaly LYMPHATICS:  No cervical adenopathy LUNGS:  Clear to auscultation bilaterally BACK:    No CVA tenderness CHEST:  Unremarkable HEART:  S1 and S2 within normal limits, no S3, no S4, no clicks, no rubs, no murmurs ABD:  Positive bowel sounds normal in frequency in pitch, no bruits, no rebound, no guarding, unable to assess midline mass or bruit with the patient seated. EXT:  2 plus pulses throughout, no edema, no cyanosis no clubbing SKIN:  No rashes no nodules NEURO:  Cranial nerves II through XII grossly intact, motor grossly intact throughout PSYCH:  Cognitively intact, oriented to person place and time   EKG:  EKG is  ordered today. Sinus rhythm, rate 83, leftward axis, intervals within normal limits, poor anterior R wave progression, nonspecific T wave flattening.   Recent Labs: 03/27/2021: Magnesium 1.9 02/01/2022: ALT 9; Hemoglobin 12.3; Platelets 226; TSH 1.28 02/09/2022: BUN 25; Creatinine, Ser 1.22; Potassium 4.7; Sodium 140    Lipid Panel    Component Value Date/Time   CHOL 136 02/01/2022 0000   TRIG 227 (H) 02/01/2022 0000   HDL 27 (L) 02/01/2022 0000   CHOLHDL 5.0 (H) 02/01/2022 0000   VLDL 37 04/09/2021 0500   LDLCALC 77 02/01/2022 0000      Wt Readings from Last 3 Encounters:  03/23/22 186 lb 6.4 oz (84.6 kg)  02/09/22 156 lb 11.2 oz (71.1 kg)  12/23/21 173 lb (78.5 kg)      Other studies Reviewed: Additional studies/ records that were reviewed  today include: Labs Review of the above records demonstrates:  Please see elsewhere in the note.     ASSESSMENT AND PLAN:  Chronic combined systolic and diastolic heart failure: Today I will increase his carvedilol to 12.5 mg twice daily.  We will have him come back in about 6 weeks for another med titration probably of his Entresto. \  Hypertension:    This is low and being managed in the context of treating his heart failure but he has not had any symptomatic hypotension so I do not think he will tolerate this change.    Hyperlipidemia: LDL was 77 with an HDL of 27.  No change in therapy.   DM2: A1c was 5.3.  No change in therapy.   History of CVA:   He has mild aphasia and left hemiparesis mildly.   Current medicines are reviewed at length with the patient today.  The patient does not have concerns regarding medicines.  The following changes have been made:   As above  Labs/ tests ordered today include:    None  Orders Placed This Encounter  Procedures   EKG 12-Lead      Disposition:   FU with 6 weeks.    Signed, James Hochrein, MD  03/23/2022 3:29 PM    [facility]    

## 2022-03-23 ENCOUNTER — Ambulatory Visit (INDEPENDENT_AMBULATORY_CARE_PROVIDER_SITE_OTHER): Payer: Medicare Other | Admitting: Cardiology

## 2022-03-23 ENCOUNTER — Encounter: Payer: Self-pay | Admitting: Cardiology

## 2022-03-23 VITALS — BP 114/60 | HR 83 | Ht 63.0 in | Wt 186.4 lb

## 2022-03-23 DIAGNOSIS — E785 Hyperlipidemia, unspecified: Secondary | ICD-10-CM | POA: Diagnosis not present

## 2022-03-23 DIAGNOSIS — I2510 Atherosclerotic heart disease of native coronary artery without angina pectoris: Secondary | ICD-10-CM | POA: Diagnosis not present

## 2022-03-23 DIAGNOSIS — I10 Essential (primary) hypertension: Secondary | ICD-10-CM

## 2022-03-23 DIAGNOSIS — I5042 Chronic combined systolic (congestive) and diastolic (congestive) heart failure: Secondary | ICD-10-CM

## 2022-03-23 MED ORDER — CARVEDILOL 12.5 MG PO TABS: 12.5000 mg | ORAL_TABLET | Freq: Two times a day (BID) | ORAL | 3 refills | Status: DC

## 2022-03-23 NOTE — Patient Instructions (Addendum)
Medication Instructions:     INCREASE  CARVEDILOL TO 12.5 MG  TWICE A DAY   A NEW PRESCRIPTION  SENT TO [facility].  *If you need a refill on your cardiac medications before your next appointment, please call your pharmacy*   Lab Work:   NOT NEEDED   Testing/Procedures:   NOT NEEDED   Follow-Up: At [facility], you and your health needs are our priority.  As part of our continuing mission to provide you with exceptional heart care, we have created designated Provider Care Teams.  These Care Teams include your primary Cardiologist (physician) and Advanced Practice Providers (APPs -  Physician Assistants and Nurse Practitioners) who all work together to provide you with the care you need, when you need it.  We recommend signing up for the patient portal called "MyChart".  Sign up information is provided on this After Visit Summary.  MyChart is used to connect with patients for Virtual Visits (Telemedicine).  Patients are able to view lab/test results, encounter notes, upcoming appointments, etc.  Non-urgent messages can be sent to your provider as well.   To learn more about what you can do with MyChart, go to [email or web address].    Your next appointment:   6 week(s)  The format for your next appointment:   In Person  Provider:   [name] PA      Important Information About Sugar

## 2022-03-27 ENCOUNTER — Other Ambulatory Visit: Payer: Self-pay | Admitting: Cardiology

## 2022-04-19 ENCOUNTER — Other Ambulatory Visit: Payer: Self-pay | Admitting: Cardiology

## 2022-04-25 NOTE — Progress Notes (Signed)
Cardiology Office Note:    Date:  05/08/2022   ID:  Brad Harrell, DOB 03-16-1946, MRN [id number]  PCP:  Nolene Ebbs, MD  Cardiologist:  Minus Breeding, MD  Electrophysiologist:  None   Referring MD: Nolene Ebbs, MD   Chief Complaint: follow-up of CHF (GDMT optimization)  History of Present Illness:    Brad Harrell is a 76 y.o. 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 Percival Spanish and presents today for follow-up of CHF (GDMT optimization).   Patient has a long history of chronic systolic CHF.  Echo in 2015 showed LVEF of 30-35%.  He was seen by Cardiology during a hospitalization  in 02/2021 after having not being seen for several years. He was admitted in 02/2021 for sepsis secondary to left epididymoorchitis with abscess s/p left scrotal exploration with left orchiectomy and drainage of pyelocele and a TURP.  Echo during that admission showed LVEF of 25-30% with global hypokinesis, normal RV with mildly reduced systolic function, and mild dilatation of the ascending aorta measuring 42 mm.  He was started on GDMT with plans for outpatient ischemia evaluation.  Before he could be seen for follow-up, he was readmitted in 03/2021 for acute CVA.  He was not a candidate for tPA and was taken urgently to INR for thrombectomy.  TTE with bubble study showed LVEF of 25-30% with global hypokinesis, mild LVH, and grade 1 diastolic dysfunction but no evidence of apical thrombus or intra-atrial shunt.  Neurology recommended DAPT with aspirin and Plavix for 90 days.  Outpatient monitor showed no evidence of atrial fibrillation.  He ultimately underwent a Myoview in 05/2021 which was high risk with a large inferior wall defect from apex to base consistent with  prior MI but no ischemia. Given absence of ischemia, medical therapy was recommended.  GDMT was adjusted.    Last Echo in 01/2022 showed LVEF of 30-35% with global hypokinesis, grade 1 diastolic dysfunction. RV was normal and there was no significant valvular disease.  Patient was last seen by Dr. Percival Spanish on 03/23/2022 at which time he was doing well. His Coreg was increased and he was instructed to come back in 6 weeks for continued GDMT optimization.  Patient presents today for follow-up.  BP soft in the office today -initially 82/50 and then 90/58 on my recheck.  However, patient is completely asymptomatic with this.  He denies any lightheadedness, dizziness, near syncope/syncope.  No chest pain, shortness of breath, orthopnea, PND, lower extremity edema.  No palpitations. His weight is down 3 lbs since last office visit in 02/2022; however, up 27 lbs from visit in 01/2022 where he weighed 156 lbs. This is much lower than any of his other documented weights so I don't think this is accurate and neither dose his daughter who is present today. However, daughter does states that she thinks his weight has been trending up and that his abdomen is more distended than usual. She states some of his paints aren't fitting around the waist.  BP was 102/68 at an office visit last week. He has a wrist cuff at home and BP was 122/72 two days ago on this.  Past Medical History:  Diagnosis Date   Anemia    Iron Defficiency   Cardiomyopathy ([place])    Combined  systolic and diastolic congestive heart failure (HCC)    Diabetes mellitus without complication ([place])    TYPE 2   Foley catheter in place    SINCE NOV 2017   H/O cocaine abuse ([place])    Hyperlipidemia    Hypertension    Hypothyroidism    Liver abscess    measuring 7 x 9 cm/notes 03/04/2013   Stroke ([place]) 2010   WEAK ON BOTH SIDES NOW TOTAL OF 3 STROKES    Urinary retention     Past Surgical History:  Procedure Laterality Date   APPENDECTOMY      BUBBLE STUDY  04/14/2021   Procedure: BUBBLE STUDY;  Surgeon: [name], MD;  Location[facility] ENDOSCOPY;  Service: Cardiology;;   COLONOSCOPY N/A 03/11/2013   Procedure: COLONOSCOPY;  Surgeon: Beryle Beams, MD;  Location: [place];  Service: Endoscopy;  Laterality: N/A;   COLONOSCOPY WITH PROPOFOL N/A 03/15/2018   Procedure: COLONOSCOPY WITH PROPOFOL;  Surgeon: Carol Ada, MD;  Location: [facility] ENDOSCOPY;  Service: Endoscopy;  Laterality: N/A;   CYSTOSCOPY WITH INSERTION OF UROLIFT N/A 01/04/2017   Procedure: CYSTOSCOPY WITH INSERTION OF UROLIFT x8;  Surgeon: Cleon Gustin, MD;  Location: [facility] ORS;  Service: Urology;  Laterality: N/A;   ESOPHAGOGASTRODUODENOSCOPY N/A 03/11/2013   Procedure: ESOPHAGOGASTRODUODENOSCOPY (EGD);  Surgeon: Beryle Beams, MD;  Location: [facility] ENDOSCOPY;  Service: Endoscopy;  Laterality: N/A;   GIVENS CAPSULE STUDY N/A 03/12/2013   Procedure: GIVENS CAPSULE STUDY;  Surgeon: Beryle Beams, MD;  Location: [place];  Service: Endoscopy;  Laterality: N/A;   INGUINAL HERNIA REPAIR Right    IR CT HEAD LTD  04/08/2021   IR PERCUTANEOUS ART THROMBECTOMY/INFUSION INTRACRANIAL INC DIAG ANGIO  04/08/2021   POLYPECTOMY  03/15/2018   Procedure: POLYPECTOMY;  Surgeon: Carol Ada, MD;  Location: [facility] ENDOSCOPY;  Service: Endoscopy;;   RADIOLOGY WITH ANESTHESIA N/A 04/08/2021   Procedure: IR WITH ANESTHESIA;  Surgeon: Radiologist, Medication, MD;  Location: [place];  Service: Radiology;  Laterality: N/A;   SCROTAL EXPLORATION Left 03/25/2021   Procedure: LEFT SCROTUM EXPLORATION WITH ORCHIECTOMY;  Surgeon: [name], MD;  Location: [facility] ORS;  Service: Urology;  Laterality: Left;   TEE WITHOUT CARDIOVERSION N/A 04/14/2021   Procedure: TRANSESOPHAGEAL ECHOCARDIOGRAM (TEE);  Surgeon: [name], MD;  Location: [place];  Service: Cardiology;  Laterality: N/A;   TIBIA FRACTURE SURGERY Left    TRANSURETHRAL RESECTION OF PROSTATE N/A 03/25/2021   Procedure: CYSTOSCOPY  TRANSURETHRAL RESECTION OF THE PROSTATE (TURP);  Surgeon: [name], MD;  Location: [facility] ORS;  Service: Urology;  Laterality: N/A;    Current Medications: Current Meds  Medication Sig   aspirin EC 81 MG tablet Take 81 mg by mouth daily. Swallow whole.   atorvastatin (LIPITOR) 40 MG tablet Take 1 tablet (40 mg total) by mouth daily.   clopidogrel (PLAVIX) 75 MG tablet Take 75 mg by mouth daily.   ENTRESTO 49-51 MG Take 1 tablet by mouth twice daily   ergocalciferol (VITAMIN D2) 1.25 MG (50000 UT) capsule Take 50,000 Units by mouth once a week.   FARXIGA 10 MG TABS tablet Take 10 mg by mouth daily.   finasteride (PROSCAR) 5 MG tablet Take 5 mg by mouth daily at 12 noon.   furosemide (LASIX) 20 MG tablet Take 20 mg by mouth daily.   glucose blood (FREESTYLE LITE) test strip Use as instructed   glucose monitoring kit (FREESTYLE) monitoring kit 1 each by Does not apply route as needed for other. For  blood sugar check   Lancets 28G MISC To check blood sugar three times daily with meals and at bedtime   levothyroxine (SYNTHROID, LEVOTHROID) 100 MCG tablet Take 100 mcg by mouth daily before breakfast.    metoprolol succinate (TOPROL-XL) 50 MG 24 hr tablet Take 1 tablet (50 mg total) by mouth at bedtime. Take with or immediately following a meal.   spironolactone (ALDACTONE) 25 MG tablet Take 0.5 tablets (12.5 mg total) by mouth daily.   [DISCONTINUED] carvedilol (COREG) 12.5 MG tablet Take 1 tablet (12.5 mg total) by mouth 2 (two) times daily.     Allergies:   Rocephin [ceftriaxone]   Social History   Socioeconomic History   Marital status: Widowed    Spouse name: Not on file   Number of children: Not on file   Years of education: Not on file   Highest education level: Not on file  Occupational History   Not on file  Tobacco Use   Smoking status: Former    Packs/day: 1.00    Years: 51.00    Total pack years: 51.00    Types: Cigarettes   Smokeless tobacco: Never   Tobacco comments:     QUIT NOV 2010  Vaping Use   Vaping Use: Never used  Substance and Sexual Activity   Alcohol use: No    Comment: NOV 2010   Drug use: Yes    Types: Cocaine    Comment: NOV 2010 LAST USED   Sexual activity: Not Currently  Other Topics Concern   Not on file  Social History Narrative   Not on file   Social Determinants of Health   Financial Resource Strain: Not on file  Food Insecurity: Not on file  Transportation Needs: Not on file  Physical Activity: Not on file  Stress: Not on file  Social Connections: Not on file     Family History: The patient's family history includes Diabetes type II in his father; Hypertension in his brother, father, and mother.  ROS:   Please see the history of present illness.     EKGs/Labs/Other Studies Reviewed:    The following studies were reviewed today:  Monitor 04/2021: Normal sinus rhythm.  No arrhythmias. _______________   Brantley Fling 06/16/2021: The left ventricular ejection fraction is severely decreased (<30%). Nuclear stress EF: 27%. Blood pressure demonstrated a normal response to exercise. There was no ST segment deviation noted during stress. Findings consistent with prior myocardial infarction. This is a high risk study.   Large inferior wall infarct from apex to base no ischemia EF 27% diffuse hypokinesis worse in inferior wall  _______________  Echocardiogram 02/24/2022: Impressions: 1. Left ventricular ejection fraction, by estimation, is 30 to 35%. The  left ventricle has moderately decreased function. The left ventricle  demonstrates global hypokinesis. Left ventricular diastolic parameters are  consistent with Grade I diastolic  dysfunction (impaired relaxation). The average left ventricular global  longitudinal strain is 18.9 %. The global longitudinal strain is normal.   2. Right ventricular systolic function is normal. The right ventricular  size is normal. There is normal pulmonary artery systolic pressure.    3. The mitral valve is normal in structure. Trivial mitral valve  regurgitation. No evidence of mitral stenosis.   4. The aortic valve is tricuspid. Aortic valve regurgitation is not  visualized. No aortic stenosis is present.   5. The inferior vena cava is normal in size with greater than 50%  respiratory variability, suggesting right atrial pressure of 3 mmHg.  EKG:  EKG not ordered today.   Recent Labs: 02/01/2022: ALT 9; Hemoglobin 12.3; Platelets 226; TSH 1.28 02/09/2022: BUN 25; Creatinine, Ser 1.22; Potassium 4.7; Sodium 140  Recent Lipid Panel    Component Value Date/Time   CHOL 136 02/01/2022 0000   TRIG 227 (H) 02/01/2022 0000   HDL 27 (L) 02/01/2022 0000   CHOLHDL 5.0 (H) 02/01/2022 0000   VLDL 37 04/09/2021 0500   LDLCALC 77 02/01/2022 0000    Physical Exam:    Vital Signs: BP (!) 90/58 (BP Location: Left Arm, Patient Position: Sitting)   Pulse 92   Ht 5\' 3"  (1.6 m)   Wt 183 lb 3.2 oz (83.1 kg)   SpO2 95%   BMI 32.45 kg/m     Wt Readings from Last 3 Encounters:  05/08/22 183 lb 3.2 oz (83.1 kg)  03/23/22 186 lb 6.4 oz (84.6 kg)  02/09/22 156 lb 11.2 oz (71.1 kg)     General: 76 y.o.-year-old African-American male in no acute distress. HEENT: Normocephalic and atraumatic. Sclera clear.  Neck: Supple. No JVD. Heart: RRR. Distinct S1 and S2. No murmurs, gallops, or rubs. Radial pulses 2+ and equal bilaterally. Lungs: No increased work of breathing. Clear to ausculation bilaterally. No wheezes, rhonchi, or rales.  Abdomen: Soft and non-tender to palpations but mildly distended. Extremities: No lower extremity edema.    Skin: Warm and dry. Neuro: Alert and oriented x3. No focal deficits. Psych: Normal affect. Responds appropriately.  Assessment:    1. Chronic combined systolic and diastolic CHF (congestive heart failure) ([place])   2. Presumed CAD   3. Hypotension, unspecified hypotension type   4. History of hypertension   5. Hyperlipidemia, unspecified  hyperlipidemia type   6. Type 2 diabetes mellitus with complication, without long-term current use of insulin (HCC)     Plan:    Chronic Combined CHF Long history of CHF dating back to at least 2015.  Likely ischemic cardiomyopathy ACE given abnormal Myoview.  Last Echo in 01/2022 showed LVEF of 30-35% with global hypokinesis, grade 1 diastolic dysfunction. RV was normal and there was no significant valvular disease. - Abdomen appears mildly distended but weight down 3 lbs from last visit and no other signs of volume overload.  - Will check BNP and BMET. - Continue Lasix 20mg  daily as needed for weight gain or worsening lower extremity edema.  - Will stop Coreg and switch to Toprol-XL 50mg  daily given soft BP. - Continue Entresto 49-51mg  twice daily.  - Continue Spironolactone 12.5mg  daily.  - Will have patient follow-up in 1-2 weeks with me or PharmD to reassess BP. Advised patient to let us know if systolic BP remains <165 bpm or if he develops any symptoms of hypotension.   Presumed CAD Myoview in 05/2021 which was high risk with a large inferior wall defect from apex to base consistent with prior MI but no ischemia.  Given absence of ischemia, medical therapy was recommended.  - No angina. - On DAPT with Aspirin and Plavix from prior CVAs. - Continue statin.   Hypotensive History of Hypertension History of hypertension but hypotension in the office today. Initially 82/50 and then 90/58 on my recheck at the end of visit. Patient is completely asymptomatic with this. Systolic BP in the low 537S at a different office last week.  - Will stop Coreg and switch to Toprol-XL 50mg  as above which should have less of an affect on his BP. - Given his CHF, would like to continue [facility]  and Spironolactone if possible. Will continue for now. - Patient has a wrist cuff at home and systolic BP 286 on that. Asked patient/daughter to purchase an arm cuff and monitor daily BP/HR and bring to follow-up  visit. Advised them to let us know if systolic BP stays below 381.    Hyperlipidemia Recent lipid panel on 02/01/2022: Total Cholesterol 136, Triglycerides 227, HDL 27, LDL 77. LDL goal <70 given prior CVA and presumed CAD. - Lipitor was increased to 14m daily at visit in 01/2022. - Patient is due for fasting lipid panel and LFTs; however he is not fasting today. Can check at follow-up visit.   Type 2 Diabetes Mellitus - Management per PCP.   Disposition: Follow up with me or PharmD in 2 weeks to reassess BP and to see whether any additional changes to GDMT can be made.   Medication Adjustments/Labs and Tests Ordered: Current medicines are reviewed at length with the patient today.  Concerns regarding medicines are outlined above.  Orders Placed This Encounter  Procedures   Pro b natriuretic peptide ([facility]NP)[facility])   Basic Metabolic Panel (BMET)   Meds ordered this encounter  Medications   metoprolol succinate (TOPROL-XL) 50 MG 24 hr tablet    Sig: Take 1 tablet (50 mg total) by mouth at bedtime. Take with or immediately following a meal.    Dispense:  90 tablet    Refill:  3    Patient Instructions  Medication Instructions:   STOP CARVEDILOL  START METOPROLOL SUCC ER 50 MG ONCE DAILY AT BEDTIME  *If you need a refill on your cardiac medications before your next appointment, please call your pharmacy*   Follow-Up: At [facility] you and your health needs are our priority.  As part of our continuing mission to provide you with exceptional heart care, we have created designated Provider Care Teams.  These Care Teams include your primary Cardiologist (physician) and Advanced Practice Providers (APPs -  Physician Assistants and Nurse Practitioners) who all work together to provide you with the care you need, when you need it.  We recommend signing up for the patient portal called "MyChart".  Sign up information is provided on this After Visit Summary.   MyChart is used to connect with patients for Virtual Visits (Telemedicine).  Patients are able to view lab/test results, encounter notes, upcoming appointments, etc.  Non-urgent messages can be sent to your provider as well.   To learn more about what you can do with MyChart, go to [email or web address]    Your next appointment:   1-2 week(s)  The format for your next appointment:   In Person  Provider:   CSande Rives PA-C  OLaredoBLOOD PRESSURE FOR THE ARM-CHECK BP AND BRING CUFF AND LIST TO FOLLOW UP APPOINTMENT  Important Information About Sugar         SLiane Comber PA-C  05/08/2022 5:16 PM    [facility]

## 2022-05-08 ENCOUNTER — Encounter: Payer: Self-pay | Admitting: Student

## 2022-05-08 ENCOUNTER — Ambulatory Visit (INDEPENDENT_AMBULATORY_CARE_PROVIDER_SITE_OTHER): Payer: Medicare Other | Admitting: Student

## 2022-05-08 VITALS — BP 80/58 | HR 92 | Ht 63.0 in | Wt 183.2 lb

## 2022-05-08 DIAGNOSIS — I959 Hypotension, unspecified: Secondary | ICD-10-CM | POA: Diagnosis not present

## 2022-05-08 DIAGNOSIS — Z8679 Personal history of other diseases of the circulatory system: Secondary | ICD-10-CM

## 2022-05-08 DIAGNOSIS — I5042 Chronic combined systolic (congestive) and diastolic (congestive) heart failure: Secondary | ICD-10-CM

## 2022-05-08 DIAGNOSIS — E118 Type 2 diabetes mellitus with unspecified complications: Secondary | ICD-10-CM

## 2022-05-08 DIAGNOSIS — I2510 Atherosclerotic heart disease of native coronary artery without angina pectoris: Secondary | ICD-10-CM | POA: Diagnosis not present

## 2022-05-08 DIAGNOSIS — E785 Hyperlipidemia, unspecified: Secondary | ICD-10-CM

## 2022-05-08 MED ORDER — METOPROLOL SUCCINATE ER 50 MG PO TB24: 50.0000 mg | ORAL_TABLET | Freq: Every day | ORAL | 3 refills | Status: DC

## 2022-05-08 NOTE — Patient Instructions (Addendum)
Medication Instructions:   STOP CARVEDILOL  START METOPROLOL SUCC ER 50 MG ONCE DAILY AT BEDTIME  *If you need a refill on your cardiac medications before your next appointment, please call your pharmacy*   Follow-Up: At [facility], you and your health needs are our priority.  As part of our continuing mission to provide you with exceptional heart care, we have created designated Provider Care Teams.  These Care Teams include your primary Cardiologist (physician) and Advanced Practice Providers (APPs -  Physician Assistants and Nurse Practitioners) who all work together to provide you with the care you need, when you need it.  We recommend signing up for the patient portal called "MyChart".  Sign up information is provided on this After Visit Summary.  MyChart is used to connect with patients for Virtual Visits (Telemedicine).  Patients are able to view lab/test results, encounter notes, upcoming appointments, etc.  Non-urgent messages can be sent to your provider as well.   To learn more about what you can do with MyChart, go to [email or web address].    Your next appointment:   1-2 week(s)  The format for your next appointment:   In Person  Provider:   [name], PA-C  [place] BLOOD PRESSURE FOR THE ARM-CHECK BP AND BRING CUFF AND LIST TO FOLLOW UP APPOINTMENT  Important Information About Sugar

## 2022-05-09 ENCOUNTER — Telehealth: Payer: Self-pay | Admitting: Student

## 2022-05-09 ENCOUNTER — Other Ambulatory Visit: Payer: Self-pay

## 2022-05-09 DIAGNOSIS — I5042 Chronic combined systolic (congestive) and diastolic (congestive) heart failure: Secondary | ICD-10-CM

## 2022-05-09 LAB — BASIC METABOLIC PANEL
BUN/Creatinine Ratio: 18 (ref 10–24)
BUN: 27 mg/dL (ref 8–27)
CO2: 18 mmol/L — ABNORMAL LOW (ref 20–29)
Calcium: 10.6 mg/dL — ABNORMAL HIGH (ref 8.6–10.2)
Chloride: 102 mmol/L (ref 96–106)
Creatinine, Ser: 1.49 mg/dL — ABNORMAL HIGH (ref 0.76–1.27)
Glucose: 262 mg/dL — ABNORMAL HIGH (ref 70–99)
Potassium: 4.7 mmol/L (ref 3.5–5.2)
Sodium: 139 mmol/L (ref 134–144)
eGFR: 48 mL/min/{1.73_m2} — ABNORMAL LOW (ref >59)

## 2022-05-09 LAB — PRO B NATRIURETIC PEPTIDE: NT-Pro BNP: 68 pg/mL (ref 0–486)

## 2022-05-09 NOTE — Telephone Encounter (Signed)
Follow Up:     Patient's daughter is returning   Michelle's call from today, concerning his lab results.

## 2022-05-24 ENCOUNTER — Ambulatory Visit: Payer: Medicare Other

## 2022-05-24 DIAGNOSIS — E1165 Type 2 diabetes mellitus with hyperglycemia: Secondary | ICD-10-CM | POA: Insufficient documentation

## 2022-05-24 DIAGNOSIS — Z860100 Personal history of colon polyps, unspecified: Secondary | ICD-10-CM | POA: Insufficient documentation

## 2022-05-24 NOTE — Progress Notes (Deleted)
Patient ID: Brad Harrell                 DOB: [birth year]                      MRN: [id number]     HPI: Brad Harrell is a 76 y.o. male referred by Dr. Marland Kitchen to pharmacy clinic for HF medication management. PMH is significant for ***. Most recent LVEF *** on ***.  Today she returns to pharmacy clinic for further medication titration. At last visit with MD ***. Symptomatically, she is feeling ***, *** dizziness, lightheadedness, and fatigue. *** chest pain or palpitations. Feels SOB when ***. Able to complete all ADLs. Activity level ***. She *** checks her weight at home (normal range *** - *** lbs). *** LEE, PND, or orthopnea. Appetite has been ***. She *** adheres to a low-salt diet.  Current CHF meds:  Previously tried:  BP goal:   Family History:   Social History:   Diet:   Exercise:   Home BP readings:   Wt Readings from Last 3 Encounters:  05/08/22 183 lb 3.2 oz (83.1 kg)  03/23/22 186 lb 6.4 oz (84.6 kg)  02/09/22 156 lb 11.2 oz (71.1 kg)   BP Readings from Last 3 Encounters:  05/08/22 (!) 80/58  03/23/22 114/60  02/09/22 104/60   Pulse Readings from Last 3 Encounters:  05/08/22 92  03/23/22 83  02/09/22 94    Renal function: CrCl cannot be calculated (Unknown ideal weight.).  Past Medical History:  Diagnosis Date   Anemia    Iron Defficiency   Cardiomyopathy ([place])    Combined systolic and diastolic congestive heart failure (HCC)    Diabetes mellitus without complication ([place])    TYPE 2   Foley catheter in place    SINCE NOV 2017   H/O cocaine abuse ()    Hyperlipidemia    Hypertension    Hypothyroidism    Liver abscess    measuring 7 x 9 cm/notes 03/04/2013   Stroke ([place]) 2010   WEAK ON BOTH SIDES NOW TOTAL OF 3 STROKES    Urinary retention     Current Outpatient Medications on File Prior to Visit  Medication Sig Dispense Refill   aspirin EC 81 MG tablet Take 81 mg by mouth daily. Swallow whole.     atorvastatin (LIPITOR) 40 MG tablet Take  1 tablet (40 mg total) by mouth daily. 90 tablet 3   clopidogrel (PLAVIX) 75 MG tablet Take 75 mg by mouth daily.     ENTRESTO 49-51 MG Take 1 tablet by mouth twice daily 60 tablet 0   ergocalciferol (VITAMIN D2) 1.25 MG (50000 UT) capsule Take 50,000 Units by mouth once a week.     FARXIGA 10 MG TABS tablet Take 10 mg by mouth daily.     finasteride (PROSCAR) 5 MG tablet Take 5 mg by mouth daily at 12 noon.     furosemide (LASIX) 20 MG tablet Take 20 mg by mouth daily.     glucose blood (FREESTYLE LITE) test strip Use as instructed 100 each 12   glucose monitoring kit (FREESTYLE) monitoring kit 1 each by Does not apply route as needed for other. For blood sugar check 1 each 0   Lancets 28G MISC To check blood sugar three times daily with meals and at bedtime 120 each 3   levothyroxine (SYNTHROID, LEVOTHROID) 100 MCG tablet Take 100 mcg by mouth daily before breakfast.  metoprolol succinate (TOPROL-XL) 50 MG 24 hr tablet Take 1 tablet (50 mg total) by mouth at bedtime. Take with or immediately following a meal. 90 tablet 3   spironolactone (ALDACTONE) 25 MG tablet Take 0.5 tablets (12.5 mg total) by mouth daily. 45 tablet 3   No current facility-administered medications on file prior to visit.    Allergies  Allergen Reactions   Rocephin [Ceftriaxone] Nausea And Vomiting     Assessment/Plan:  1. CHF -

## 2022-05-29 ENCOUNTER — Other Ambulatory Visit: Payer: Self-pay | Admitting: Cardiology

## 2022-06-15 ENCOUNTER — Encounter: Payer: Self-pay | Admitting: Pharmacist Clinician (PhC)/ Clinical Pharmacy Specialist

## 2022-06-15 ENCOUNTER — Ambulatory Visit (INDEPENDENT_AMBULATORY_CARE_PROVIDER_SITE_OTHER): Payer: Medicare Other | Admitting: Pharmacist Clinician (PhC)/ Clinical Pharmacy Specialist

## 2022-06-15 DIAGNOSIS — I5040 Unspecified combined systolic (congestive) and diastolic (congestive) heart failure: Secondary | ICD-10-CM

## 2022-06-15 DIAGNOSIS — I2510 Atherosclerotic heart disease of native coronary artery without angina pectoris: Secondary | ICD-10-CM

## 2022-06-15 MED ORDER — EMPAGLIFLOZIN 10 MG PO TABS: 10.0000 mg | ORAL_TABLET | Freq: Every day | ORAL | 3 refills | Status: DC

## 2022-06-15 NOTE — Patient Instructions (Signed)
Return for a a follow up appointment with Dr. Percival Spanish in October  Check your blood pressure at home 3-4 times each week and keep record of the readings.  Take your BP meds as follows:  Continue with your current medications for now.   I will try to get Jardiance covered by your insurance.  If we get it, please don't start it until you have talked to Dr. Jeanie Cooks.  We'd ideally like for you to stop the [name], as it doesn't have the heart protectiveness that Jardiance does.  (Please don't take both unless he says it's okay).     Bring all of your meds, your BP cuff and your record of home blood pressures to your next appointment.  Exercise as you're able, try to walk approximately 30 minutes per day.  Keep salt intake to a minimum, especially watch canned and prepared boxed foods.  Eat more fresh fruits and vegetables and fewer canned items.  Avoid eating in fast food restaurants.    HOW TO TAKE YOUR BLOOD PRESSURE: Rest 5 minutes before taking your blood pressure.  Don't smoke or drink caffeinated beverages for at least 30 minutes before. Take your blood pressure before (not after) you eat. Sit comfortably with your back supported and both feet on the floor (don't cross your legs). Elevate your arm to heart level on a table or a desk. Use the proper sized cuff. It should fit smoothly and snugly around your bare upper arm. There should be enough room to slip a fingertip under the cuff. The bottom edge of the cuff should be 1 inch above the crease of the elbow. Ideally, take 3 measurements at one sitting and record the average

## 2022-06-15 NOTE — Progress Notes (Signed)
06/15/2022 Brad Harrell 1946/05/10 [id number]   HPI:  Brad Harrell is a 76 y.o. male patient of Dr Percival Spanish, with a PMH below who presents today for heart failure medication titration.   He was most recently seen by Brad Rives PA.  At that appointment his BP was first read at 80/58 with repeat at 90/58.  Patient was completely asymptomatic.  Because of this she stopped his carvedilol and instead put him on metoprolol, which is less impactful on BP.    Primary gave him [name] at his last visit, but for unknown reasons, was not able to get (not covered on plan?).  PCP then started [name] 100 mg daily.  Unfortunately that does not have the HFrEF benefit that we need.    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; myoview shows large inferior wall defect consistent with prior MI, no ischemia; on DAPT (clopidogrel, ASA)  DM2 5/22 A1c 5.3 (down from 11.4 years ago)  hypothyroidism 4/23 TSH 1.28 on levothyroxine 100 mcg  hyperlipidemia 4/23 LDL 77 - now on atorvastatin 40     Blood Pressure Goal:  130/80  Current Medications: Entresto 49/51 mg bid, spironolactone 12.5 mg qd, metoprolol succ 50 mg qd, furosemide 20 mg  Social Hx: no tobacco, no alcohol, no regular caffeine  Diet: breakfast usually eggs, [place] sausage, toast; lunch and dinner are usually heat and eat that are brought to him;  no regular snacking; some bananas and grapes  Exercise: walker at home and out, wheelchair only for MD visits  Home BP readings: none with them today, daughter notes usually 409-811'B systolic.    Intolerances: no cardiac medication intolerances  Labs: 05/08/22: Na 139, K 4.7, Glu 262, BUN 27, SCr 1.49, GFR 48   Wt Readings from Last 3 Encounters:  05/08/22 183 lb 3.2 oz (83.1 kg)  03/23/22 186 lb 6.4 oz (84.6 kg)  02/09/22 156 lb 11.2 oz (71.1 kg)   BP Readings from Last 3 Encounters:  06/15/22 106/70  05/08/22 (!) 80/58  03/23/22 114/60   Pulse  Readings from Last 3 Encounters:  06/15/22 75  05/08/22 92  03/23/22 83    Current Outpatient Medications  Medication Sig Dispense Refill   aspirin EC 81 MG tablet Take 81 mg by mouth daily. Swallow whole.     atorvastatin (LIPITOR) 40 MG tablet Take 1 tablet (40 mg total) by mouth daily. 90 tablet 3   clopidogrel (PLAVIX) 75 MG tablet Take 75 mg by mouth daily.     empagliflozin (JARDIANCE) 10 MG TABS tablet Take 1 tablet (10 mg total) by mouth daily before breakfast. 90 tablet 3   ergocalciferol (VITAMIN D2) 1.25 MG (50000 UT) capsule Take 50,000 Units by mouth once a week.     finasteride (PROSCAR) 5 MG tablet Take 5 mg by mouth daily at 12 noon.     glucose blood (FREESTYLE LITE) test strip Use as instructed 100 each 12   glucose monitoring kit (FREESTYLE) monitoring kit 1 each by Does not apply route as needed for other. For blood sugar check 1 each 0   Lancets 28G MISC To check blood sugar three times daily with meals and at bedtime 120 each 3   levothyroxine (SYNTHROID, LEVOTHROID) 100 MCG tablet Take 100 mcg by mouth daily before breakfast.      metoprolol succinate (TOPROL-XL) 50 MG 24 hr tablet Take 1 tablet (50 mg total) by mouth at bedtime. Take with or immediately following  a meal. 90 tablet 3   sacubitril-valsartan (ENTRESTO) 49-51 MG Take 1 tablet by mouth twice daily 90 tablet 3   spironolactone (ALDACTONE) 25 MG tablet Take 0.5 tablets (12.5 mg total) by mouth daily. 45 tablet 3   furosemide (LASIX) 20 MG tablet Take 20 mg by mouth daily. (Patient not taking: Reported on 06/15/2022)     No current facility-administered medications for this visit.    Allergies  Allergen Reactions   Rocephin [Ceftriaxone] Nausea And Vomiting    Past Medical History:  Diagnosis Date   Anemia    Iron Defficiency   Cardiomyopathy (HCC)    Combined systolic and diastolic congestive heart failure (HCC)    Diabetes mellitus without complication (HCC)    TYPE 2   Foley catheter in place     SINCE NOV 2017   H/O cocaine abuse ([place])    Hyperlipidemia    Hypertension    Hypothyroidism    Liver abscess    measuring 7 x 9 cm/notes 03/04/2013   Stroke ([place]) 2010   WEAK ON BOTH SIDES NOW TOTAL OF 3 STROKES    Urinary retention     Blood pressure 106/70, pulse 75, SpO2 98 %.  Combined systolic and diastolic congestive heart failure [facility]) Patient with HFrEF currently at 30-35% by echo (4/23).  PCP started him on Farxiga, but apparently had issues with coverage, so switched him to [name].  Unsure if was not on formulary.  Will try Jardiance at local pharmacy to see if covered.  If so, he should finish off his current  [name] then switch to Jardiance 10 mg daily.  This will get him fully on GDMT for CHF.  He is due to see Dr. Percival Spanish later this year, will have them schedule that today.     Tommy Medal PharmD CPP [facility] [address] [place], [place] [address] [phone]

## 2022-06-15 NOTE — Assessment & Plan Note (Signed)
Patient with HFrEF currently at 30-35% by echo (4/23).  PCP started him on Farxiga, but apparently had issues with coverage, so switched him to [place].  Unsure if was not on formulary.  Will try Jardiance at local pharmacy to see if covered.  If so, he should finish off his current  [name] then switch to Jardiance 10 mg daily.  This will get him fully on GDMT for CHF.  He is due to see Dr. Percival Spanish later this year, will have them schedule that today.

## 2022-08-09 NOTE — Progress Notes (Signed)
Cardiology Office Note   Date:  08/10/2022   ID:  Brad Harrell, DOB May 11, 1946, MRN [id number]  PCP:  Nolene Ebbs, MD  Cardiologist:   Minus Breeding, MD   Chief Complaint  Patient presents with   Cardiomyopathy       History of Present Illness: Brad Harrell is a 76 y.o. male who presents for evaluation of diastolic HF.  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  Cardiology service was consulted due to his chronic combined heart failure and suspicion for cardioembolic CVA.  Previous echocardiogram obtained on 03/24/2021 showed EF 25 to 30%.  During this admission, he was placed on appropriate heart failure medication and recommend outpatient follow-up with cardiology service.  Unfortunately, he went back to the hospital with recurrent CVA.  MRI of the brain confirmed acute cortical infarction of the left MCA distribution.  TTE with bubble study performed on 04/11/2021 showed EF 25 to 30%, severe LV dysfunction, LV global hypokinesis, grade 1 DD, mild LVH.  He was placed on 325 mg daily of aspirin and a 75 mg daily of Plavix by neurology service.  We have been titrating his medications.  His last ejection fraction was 30 to 35% in Nov 2021.  This was slightly improved. He did have a perfusion study.  This demonstrated an old inferior infarct but no ischemia and he was managed medically.   His EF on follow up was still 30 to 35% and essentially unchanged.   At the last visit we tried to clarify his medicines for diabetes.  We would like to have him on [place] or Jardiance.  We were told the [name] was not covered and gave him a prescription for Jardiance.  He is not to be taking [name] per our understanding.  Our list reflects that he is taking Jardiance but his spouse did not indicate that he was.  We called the pharmacy and they still have Trulicity and indicate that he is getting [place].  Of note we have switched him from Coreg to metoprolol  because he was not tolerating titration of his Coreg.  He feels well.  He is not having any new shortness of breath, PND or orthopnea.  He gets around slowly because of his stroke.  He is not having any chest pressure, neck or arm discomfort.  He has had no new edema.   Past Surgical History:  Procedure Laterality Date   APPENDECTOMY     BUBBLE STUDY  04/14/2021   Procedure: BUBBLE STUDY;  Surgeon: [name], MD;  Location: [facility] ENDOSCOPY;  Service: Cardiology;;   COLONOSCOPY N/A 03/11/2013   Procedure: COLONOSCOPY;  Surgeon: Beryle Beams, MD;  Location: [place];  Service: Endoscopy;  Laterality: N/A;   COLONOSCOPY WITH PROPOFOL N/A 03/15/2018   Procedure: COLONOSCOPY WITH PROPOFOL;  Surgeon: Carol Ada, MD;  Location: [facility] ENDOSCOPY;  Service: Endoscopy;  Laterality: N/A;   CYSTOSCOPY WITH INSERTION OF UROLIFT N/A 01/04/2017   Procedure: CYSTOSCOPY WITH INSERTION OF UROLIFT x8;  Surgeon: Cleon Gustin, MD;  Location: [facility] ORS;  Service: Urology;  Laterality: N/A;   ESOPHAGOGASTRODUODENOSCOPY N/A 03/11/2013   Procedure: ESOPHAGOGASTRODUODENOSCOPY (EGD);  Surgeon: Beryle Beams, MD;  Location: [facility] ENDOSCOPY;  Service: Endoscopy;  Laterality: N/A;   GIVENS CAPSULE STUDY N/A 03/12/2013   Procedure: GIVENS CAPSULE STUDY;  Surgeon: Beryle Beams, MD;  Location: [place];  Service: Endoscopy;  Laterality: N/A;   INGUINAL HERNIA REPAIR Right  IR CT HEAD LTD  04/08/2021   IR PERCUTANEOUS ART THROMBECTOMY/INFUSION INTRACRANIAL INC DIAG ANGIO  04/08/2021   POLYPECTOMY  03/15/2018   Procedure: POLYPECTOMY;  Surgeon: Carol Ada, MD;  Location: [facility] ENDOSCOPY;  Service: Endoscopy;;   RADIOLOGY WITH ANESTHESIA N/A 04/08/2021   Procedure: IR WITH ANESTHESIA;  Surgeon: Radiologist, Medication, MD;  Location: [place];  Service: Radiology;  Laterality: N/A;   SCROTAL EXPLORATION Left 03/25/2021   Procedure: LEFT SCROTUM EXPLORATION WITH ORCHIECTOMY;  Surgeon: [name], MD;  Location: [facility] ORS;   Service: Urology;  Laterality: Left;   TEE WITHOUT CARDIOVERSION N/A 04/14/2021   Procedure: TRANSESOPHAGEAL ECHOCARDIOGRAM (TEE);  Surgeon: [name], MD;  Location: [place];  Service: Cardiology;  Laterality: N/A;   TIBIA FRACTURE SURGERY Left    TRANSURETHRAL RESECTION OF PROSTATE N/A 03/25/2021   Procedure: CYSTOSCOPY TRANSURETHRAL RESECTION OF THE PROSTATE (TURP);  Surgeon: [name], MD;  Location: [facility];  Service: Urology;  Laterality: N/A;     Current Outpatient Medications  Medication Sig Dispense Refill   aspirin EC 81 MG tablet Take 81 mg by mouth daily. Swallow whole.     atorvastatin (LIPITOR) 40 MG tablet Take 1 tablet (40 mg total) by mouth daily. 90 tablet 3   clopidogrel (PLAVIX) 75 MG tablet Take 75 mg by mouth daily.     empagliflozin (JARDIANCE) 10 MG TABS tablet Take 1 tablet (10 mg total) by mouth daily before breakfast. 90 tablet 3   ergocalciferol (VITAMIN D2) 1.25 MG (50000 UT) capsule Take 50,000 Units by mouth once a week.     finasteride (PROSCAR) 5 MG tablet Take 5 mg by mouth daily at 12 noon.     glucose blood (FREESTYLE LITE) test strip Use as instructed 100 each 12   glucose monitoring kit (FREESTYLE) monitoring kit 1 each by Does not apply route as needed for other. For blood sugar check 1 each 0   Lancets 28G MISC To check blood sugar three times daily with meals and at bedtime 120 each 3   levothyroxine (SYNTHROID, LEVOTHROID) 100 MCG tablet Take 100 mcg by mouth daily before breakfast.      metoprolol succinate (TOPROL-XL) 50 MG 24 hr tablet Take 1 tablet (50 mg total) by mouth at bedtime. Take with or immediately following a meal. 90 tablet 3   sacubitril-valsartan (ENTRESTO) 49-51 MG Take 1 tablet by mouth twice daily 90 tablet 3   spironolactone (ALDACTONE) 25 MG tablet Take 0.5 tablets (12.5 mg total) by mouth daily. 45 tablet 3   furosemide (LASIX) 20 MG tablet Take 20 mg by mouth daily. (Patient not taking: Reported on 08/10/2022)      No current facility-administered medications for this visit.    Allergies:   Rocephin [ceftriaxone]   ROS:  Please see the history of present illness.   Otherwise, review of systems are positive for none.   All other systems are reviewed and negative.    PHYSICAL EXAM: VS:  BP (!) 152/70   Pulse 79   Ht 5' 6"  (1.676 m)   Wt 193 lb 9.6 oz (87.8 kg)   SpO2 96%   BMI 31.25 kg/m  , BMI Body mass index is 31.25 kg/m. GEN:  No distress NECK:  No jugular venous distention at 90 degrees, waveform within normal limits, carotid upstroke brisk and symmetric, no bruits, no thyromegaly LYMPHATICS:  No cervical adenopathy LUNGS:  Clear to auscultation bilaterally BACK:  No CVA tenderness CHEST:  Unremarkable HEART:  S1 and S2  within normal limits, no S3, no S4, no clicks, no rubs, no murmurs ABD:  Positive bowel sounds normal in frequency in pitch, no bruits, no rebound, no guarding, unable to assess midline mass or bruit with the patient seated. EXT:  2 plus pulses throughout, no edema, no cyanosis no clubbing   EKG:  EKG is  not ordered today.  Recent Labs: 02/01/2022: ALT 9; Hemoglobin 12.3; Platelets 226; TSH 1.28 05/08/2022: BUN 27; Creatinine, Ser 1.49; NT-Pro BNP 68; Potassium 4.7; Sodium 139    Lipid Panel    Component Value Date/Time   CHOL 136 02/01/2022 0000   TRIG 227 (H) 02/01/2022 0000   HDL 27 (L) 02/01/2022 0000   CHOLHDL 5.0 (H) 02/01/2022 0000   VLDL 37 04/09/2021 0500   LDLCALC 77 02/01/2022 0000      Wt Readings from Last 3 Encounters:  08/10/22 193 lb 9.6 oz (87.8 kg)  05/08/22 183 lb 3.2 oz (83.1 kg)  03/23/22 186 lb 6.4 oz (84.6 kg)      Other studies Reviewed: Additional studies/ records that were reviewed today include: Labs, pharmacy called. Review of the above records demonstrates:  Please see elsewhere in the note.     ASSESSMENT AND PLAN:  Chronic combined systolic and diastolic heart failure: Today we called his primary care office.  We  called his pharmacy.  We think he needs to be on either the [place] or Farxiga and we will pick the Jardiance 10 mg if it is paid for.  Otherwise it could be [place].  We did clarify with the pharmacy that he would be off the Trulicity.  We are calling his primary care doctor to make sure that was okay.  Hypertension:   This is being managed in the context of treating his CHF.   Hyperlipidemia: LDL was 77.  HDL 27.  No change in therapy.   DM2: A1c was 5.3.  This is being managed in the context of also optimizing his heart failure medical therapy as above.  History of CVA:   He has mild aphasia and left hemiparesis.  Current medicines are reviewed at length with the patient today.  The patient does not have concerns regarding medicines.  The following changes have been made:  None  Labs/ tests ordered today include:    BMET  Orders Placed This Encounter  Procedures   Basic Metabolic Panel (BMET)      Disposition:   FU with APP in 3 months.   Signed, Minus Breeding, MD  08/10/2022 10:05 AM    [place]

## 2022-08-10 ENCOUNTER — Encounter: Payer: Self-pay | Admitting: Cardiology

## 2022-08-10 ENCOUNTER — Ambulatory Visit: Payer: Medicare Other | Attending: Cardiology | Admitting: Cardiology

## 2022-08-10 VITALS — BP 152/70 | HR 79 | Ht 66.0 in | Wt 193.6 lb

## 2022-08-10 DIAGNOSIS — I5042 Chronic combined systolic (congestive) and diastolic (congestive) heart failure: Secondary | ICD-10-CM | POA: Insufficient documentation

## 2022-08-10 DIAGNOSIS — I639 Cerebral infarction, unspecified: Secondary | ICD-10-CM | POA: Diagnosis present

## 2022-08-10 DIAGNOSIS — I10 Essential (primary) hypertension: Secondary | ICD-10-CM | POA: Diagnosis present

## 2022-08-10 DIAGNOSIS — E785 Hyperlipidemia, unspecified: Secondary | ICD-10-CM | POA: Insufficient documentation

## 2022-08-10 DIAGNOSIS — I2510 Atherosclerotic heart disease of native coronary artery without angina pectoris: Secondary | ICD-10-CM | POA: Diagnosis not present

## 2022-08-10 DIAGNOSIS — E118 Type 2 diabetes mellitus with unspecified complications: Secondary | ICD-10-CM | POA: Diagnosis present

## 2022-08-10 LAB — BASIC METABOLIC PANEL
BUN/Creatinine Ratio: 16 (ref 10–24)
BUN: 20 mg/dL (ref 8–27)
CO2: 21 mmol/L (ref 20–29)
Calcium: 9.7 mg/dL (ref 8.6–10.2)
Chloride: 106 mmol/L (ref 96–106)
Creatinine, Ser: 1.27 mg/dL (ref 0.76–1.27)
Glucose: 198 mg/dL — ABNORMAL HIGH (ref 70–99)
Potassium: 4.4 mmol/L (ref 3.5–5.2)
Sodium: 139 mmol/L (ref 134–144)
eGFR: 58 mL/min/{1.73_m2} — ABNORMAL LOW (ref >59)

## 2022-08-10 NOTE — Patient Instructions (Addendum)
Medication Instructions:  Your physician has recommended you make the following change in your medication:  STOP: [name] and [place]  START: Jardiance  Lab Work: Your physician recommends that you have the following lab drawn today: BMET  If you have labs (blood work) drawn today and your tests are completely normal, you will receive your results only by: [place] (if you have MyChart) OR A paper copy in the mail If you have any lab test that is abnormal or we need to change your treatment, we will call you to review the results.   Testing/Procedures: NONE ordered at this time of appointment     Follow-Up: At [facility], you and your health needs are our priority.  As part of our continuing mission to provide you with exceptional heart care, we have created designated Provider Care Teams.  These Care Teams include your primary Cardiologist (physician) and Advanced Practice Providers (APPs -  Physician Assistants and Nurse Practitioners) who all work together to provide you with the care you need, when you need it.  We recommend signing up for the patient portal called "MyChart".  Sign up information is provided on this After Visit Summary.  MyChart is used to connect with patients for Virtual Visits (Telemedicine).  Patients are able to view lab/test results, encounter notes, upcoming appointments, etc.  Non-urgent messages can be sent to your provider as well.   To learn more about what you can do with MyChart, go to [email or web address].    Your next appointment:   4 month(s)  The format for your next appointment:   In Person  Provider:   [name], PA-C       Important Information About Sugar

## 2022-08-11 ENCOUNTER — Encounter: Payer: Self-pay | Admitting: *Deleted

## 2022-11-06 ENCOUNTER — Other Ambulatory Visit: Payer: Self-pay | Admitting: Internal Medicine

## 2022-11-07 LAB — INFLUENZA A AND B AG, IMMUNOASSAY
INFLUENZA A ANTIGEN: NOT DETECTED
INFLUENZA B ANTIGEN: NOT DETECTED
MICRO NUMBER:: 14402240
SPECIMEN QUALITY:: ADEQUATE

## 2022-11-07 LAB — SARS-COV-2 RNA,(COVID-19) QUALITATIVE NAAT: SARS CoV2 RNA: NOT DETECTED

## 2022-11-21 ENCOUNTER — Other Ambulatory Visit: Payer: Self-pay | Admitting: Cardiology

## 2022-12-11 ENCOUNTER — Ambulatory Visit: Payer: Medicare Other | Admitting: Cardiology

## 2022-12-17 ENCOUNTER — Emergency Department (HOSPITAL_COMMUNITY): Payer: Medicare Other

## 2022-12-17 ENCOUNTER — Encounter (HOSPITAL_COMMUNITY): Payer: Self-pay

## 2022-12-17 ENCOUNTER — Other Ambulatory Visit: Payer: Self-pay

## 2022-12-17 ENCOUNTER — Emergency Department (HOSPITAL_COMMUNITY)
Admission: EM | Admit: 2022-12-17 | Discharge: 2022-12-17 | Disposition: A | Discharge status: Home / Self Care | Payer: Medicare Other | Attending: Emergency Medicine | Admitting: Emergency Medicine

## 2022-12-17 DIAGNOSIS — Z7902 Long term (current) use of antithrombotics/antiplatelets: Secondary | ICD-10-CM | POA: Insufficient documentation

## 2022-12-17 DIAGNOSIS — I509 Heart failure, unspecified: Secondary | ICD-10-CM | POA: Diagnosis not present

## 2022-12-17 DIAGNOSIS — I110 Hypertensive heart disease with heart failure: Secondary | ICD-10-CM | POA: Insufficient documentation

## 2022-12-17 DIAGNOSIS — Z7982 Long term (current) use of aspirin: Secondary | ICD-10-CM | POA: Insufficient documentation

## 2022-12-17 DIAGNOSIS — R1110 Vomiting, unspecified: Secondary | ICD-10-CM

## 2022-12-17 DIAGNOSIS — R112 Nausea with vomiting, unspecified: Secondary | ICD-10-CM | POA: Insufficient documentation

## 2022-12-17 DIAGNOSIS — E039 Hypothyroidism, unspecified: Secondary | ICD-10-CM | POA: Diagnosis not present

## 2022-12-17 DIAGNOSIS — Z8673 Personal history of transient ischemic attack (TIA), and cerebral infarction without residual deficits: Secondary | ICD-10-CM | POA: Insufficient documentation

## 2022-12-17 DIAGNOSIS — R55 Syncope and collapse: Secondary | ICD-10-CM | POA: Diagnosis not present

## 2022-12-17 LAB — CBC
HCT: 48.2 % (ref 39.0–52.0)
Hemoglobin: 14.9 g/dL (ref 13.0–17.0)
MCH: 29 pg (ref 26.0–34.0)
MCHC: 30.9 g/dL (ref 30.0–36.0)
MCV: 93.8 fL (ref 80.0–100.0)
Platelets: 213 10*3/uL (ref 150–400)
RBC: 5.14 MIL/uL (ref 4.22–5.81)
RDW: 13.5 % (ref 11.5–15.5)
WBC: 9.7 10*3/uL (ref 4.0–10.5)
nRBC: 0 % (ref 0.0–0.2)

## 2022-12-17 LAB — HEPATIC FUNCTION PANEL
ALT: 15 U/L (ref 0–44)
AST: 19 U/L (ref 15–41)
Albumin: 4.2 g/dL (ref 3.5–5.0)
Alkaline Phosphatase: 49 U/L (ref 38–126)
Bilirubin, Direct: 0.1 mg/dL (ref 0.0–0.2)
Indirect Bilirubin: 0.5 mg/dL (ref 0.3–0.9)
Total Bilirubin: 0.6 mg/dL (ref 0.3–1.2)
Total Protein: 8.1 g/dL (ref 6.5–8.1)

## 2022-12-17 LAB — TROPONIN I (HIGH SENSITIVITY)
Troponin I (High Sensitivity): 10 ng/L (ref <18)
Troponin I (High Sensitivity): 8 ng/L (ref <18)

## 2022-12-17 LAB — BASIC METABOLIC PANEL
Anion gap: 11 (ref 5–15)
BUN: 26 mg/dL — ABNORMAL HIGH (ref 8–23)
CO2: 22 mmol/L (ref 22–32)
Calcium: 9.9 mg/dL (ref 8.9–10.3)
Chloride: 105 mmol/L (ref 98–111)
Creatinine, Ser: 1.52 mg/dL — ABNORMAL HIGH (ref 0.61–1.24)
GFR, Estimated: 47 mL/min — ABNORMAL LOW (ref >60)
Glucose, Bld: 192 mg/dL — ABNORMAL HIGH (ref 70–99)
Potassium: 4.1 mmol/L (ref 3.5–5.1)
Sodium: 138 mmol/L (ref 135–145)

## 2022-12-17 LAB — LIPASE, BLOOD: Lipase: 43 U/L (ref 11–51)

## 2022-12-17 LAB — MAGNESIUM: Magnesium: 2.5 mg/dL — ABNORMAL HIGH (ref 1.7–2.4)

## 2022-12-17 LAB — BRAIN NATRIURETIC PEPTIDE: B Natriuretic Peptide: 8.3 pg/mL (ref 0.0–100.0)

## 2022-12-17 MED ORDER — LACTATED RINGERS IV BOLUS
500.0000 mL | Freq: Once | INTRAVENOUS | Status: AC
  Administered 2022-12-17: 500 mL via INTRAVENOUS

## 2022-12-17 NOTE — Discharge Instructions (Signed)
You should follow-up with your cardiologist.  If you do not hear from their office in the next couple days, call the number below.  If you have any other concerning episodes or symptoms, or if you change your mind about being observed overnight in the hospital, please return to the emergency department.

## 2022-12-17 NOTE — ED Triage Notes (Addendum)
Pt arrives via POV with his daugther. Daughter states that around 45 mins ago patient became nauseated, felt short of breath and became diaphoretic for about 10 mins. She states he vomited a few times. Reports he passed out after vomiting for about a minute or so.  Pt currently AxO to name and dob only. Daughter states that is his baseline. No signs of distress at this time. Pt denies complaints. No longer diaphoretic.

## 2022-12-17 NOTE — ED Provider Notes (Signed)
[facility] Provider Note   CSN: [id number] Arrival date & time: 12/17/22  1423     History  Chief Complaint  Patient presents with   Emesis   Shortness of Breath    Brad Harrell is a 77 y.o. male.   Emesis Associated symptoms: abdominal pain   Shortness of Breath Associated symptoms: abdominal pain, diaphoresis and vomiting   Patient presents for syncopal episode.  Medical history includes HLD, cardiomyopathy, prior substance abuse, anemia, CVA, HTN, hypothyroidism, CHF.  History is provided primarily by his daughter.  Patient was in his normal state of health this morning.  At around 2 PM, patient was seated in a wheelchair at church.  He had acute onset of nausea and vomiting.  He vomited several times.  He was noted to be diaphoretic.  His daughter is unsure if he lost consciousness.  She does feel that his eyes rolled back at 1 point.  This was very brief.  He was subsequently responsive but not talking.  It took him several minutes to talk again.  When he did talk, he seemed like his normal self.  Since this episode, he has had generalized weakness.  This was noted as a review in church and going home.  He was at home, he endorsed dizziness.  When he stated that he needed to use the bathroom, he was unable to get to the bathroom due to his generalized weakness.  For this reason, patient presents to the ED.  On arrival in the ED, patient Dors is generalized abdominal pain.  Shortly thereafter, he states that it is gone.  He denies any current symptoms.  He does not remember the episode that his daughter describes.  Patient's daughter is not aware of any seizure history.  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  He typically walks with a walker.  He lives with his daughter but is able to do his own ADLs.  He is disoriented to time at baseline.     Home Medications Prior to Admission  medications   Medication Sig Start Date End Date Taking? Authorizing Provider  aspirin EC 81 MG tablet Take 81 mg by mouth daily. Swallow whole.    [provider]  atorvastatin (LIPITOR) 40 MG tablet Take 1 tablet (40 mg total) by mouth daily. 02/09/22   Darreld Mclean, PA-C  clopidogrel (PLAVIX) 75 MG tablet Take 75 mg by mouth daily.    [provider]  empagliflozin (JARDIANCE) 10 MG TABS tablet Take 1 tablet (10 mg total) by mouth daily before breakfast. 06/15/22   Minus Breeding, MD  ergocalciferol (VITAMIN D2) 1.25 MG (50000 UT) capsule Take 50,000 Units by mouth once a week.    [provider]  finasteride (PROSCAR) 5 MG tablet Take 5 mg by mouth daily at 12 noon. 03/16/21   [provider]  furosemide (LASIX) 20 MG tablet Take 20 mg by mouth daily. Patient not taking: Reported on 08/10/2022    [provider]  glucose blood (FREESTYLE LITE) test strip Use as instructed 07/31/14   Blanchie Serve, MD  glucose monitoring kit (FREESTYLE) monitoring kit 1 each by Does not apply route as needed for other. For blood sugar check 07/31/14   Blanchie Serve, MD  Lancets 28G MISC To check blood sugar three times daily with meals and at bedtime 07/31/14   Blanchie Serve, MD  levothyroxine (SYNTHROID, [place])  100 MCG tablet Take 100 mcg by mouth daily before breakfast.     [provider]  metoprolol succinate (TOPROL-XL) 50 MG 24 hr tablet Take 1 tablet (50 mg total) by mouth at bedtime. Take with or immediately following a meal. 05/08/22   [name] E, PA-C  sacubitril-valsartan (ENTRESTO) 49-51 MG Take 1 tablet by mouth 2 (two) times daily. Please keep scheduled appointment 11/23/22   Minus Breeding, MD  spironolactone (ALDACTONE) 25 MG tablet Take 0.5 tablets (12.5 mg total) by mouth daily. 04/16/21   Rosalin Hawking, MD      Allergies    Rocephin [ceftriaxone]    Review of Systems   Review of Systems  Constitutional:  Positive for  diaphoresis and fatigue.  HENT:  Positive for voice change.   Respiratory:  Positive for shortness of breath.   Gastrointestinal:  Positive for abdominal pain, nausea and vomiting.  Neurological:  Positive for dizziness, syncope and weakness (Generalized).    Physical Exam Updated Vital Signs BP 129/78   Pulse 85   Temp 97.7 F (36.5 C) (Oral)   Resp 20   Ht 5' 4"$  (1.626 m)   Wt 80.3 kg   SpO2 97%   BMI 30.38 kg/m  Physical Exam Vitals and nursing note reviewed.  Constitutional:      General: He is not in acute distress.    Appearance: He is well-developed and normal weight. He is not ill-appearing, toxic-appearing or diaphoretic.  HENT:     Head: Normocephalic and atraumatic.     Mouth/Throat:     Mouth: Mucous membranes are moist.  Eyes:     Conjunctiva/sclera: Conjunctivae normal.     Comments: Baseline clouding of right cornea  Cardiovascular:     Rate and Rhythm: Normal rate and regular rhythm.     Heart sounds: No murmur heard. Pulmonary:     Effort: Pulmonary effort is normal. No tachypnea or respiratory distress.     Breath sounds: Normal breath sounds. No decreased breath sounds, wheezing, rhonchi or rales.  Chest:     Chest wall: No tenderness.  Abdominal:     Palpations: Abdomen is soft.     Tenderness: There is no abdominal tenderness.  Musculoskeletal:        General: No swelling.     Cervical back: Normal range of motion and neck supple.     Right lower leg: No edema.     Left lower leg: No edema.  Skin:    General: Skin is warm and dry.     Capillary Refill: Capillary refill takes less than 2 seconds.     Coloration: Skin is not cyanotic or pale.  Neurological:     General: No focal deficit present.     Mental Status: He is alert. Mental status is at baseline. He is disoriented.     Cranial Nerves: No dysarthria or facial asymmetry.     Sensory: Sensation is intact. No sensory deficit.     Motor: Motor function is intact. No weakness or pronator  drift.     Coordination: Coordination is intact. Finger-Nose-Finger Test normal.  Psychiatric:        Mood and Affect: Mood normal.        Behavior: Behavior normal.     ED Results / Procedures / Treatments   Labs (all labs ordered are listed, but only abnormal results are displayed) Labs Reviewed  BASIC METABOLIC PANEL - Abnormal; Notable for the following components:      Result Value  Glucose, Bld 192 (*)    BUN 26 (*)    Creatinine, Ser 1.52 (*)    GFR, Estimated 47 (*)    All other components within normal limits  MAGNESIUM - Abnormal; Notable for the following components:   Magnesium 2.5 (*)    All other components within normal limits  CBC  LIPASE, BLOOD  BRAIN NATRIURETIC PEPTIDE  HEPATIC FUNCTION PANEL  TROPONIN I (HIGH SENSITIVITY)  TROPONIN I (HIGH SENSITIVITY)    EKG EKG Interpretation  Date/Time:  Sunday December 17 2022 14:42:41 EST Ventricular Rate:  74 PR Interval:  176 QRS Duration: 100 QT Interval:  410 QTC Calculation: 455 R Axis:   56 Text Interpretation: Normal sinus rhythm Low voltage QRS Cannot rule out Anterior infarct , age undetermined Abnormal ECG Confirmed by Godfrey Pick [phone]) on 12/17/2022 3:24:20 PM  Radiology MR BRAIN WO CONTRAST  Result Date: 12/17/2022 CLINICAL DATA:  Provided history: Transient ischemic attack. EXAM: MRI HEAD WITHOUT CONTRAST TECHNIQUE: Multiplanar, multiecho pulse sequences of the brain and surrounding structures were obtained without intravenous contrast. COMPARISON:  Head CT 12/17/2022.  Brain MRI 05/10/2021. FINDINGS: Intermittently motion degraded examination (with up to moderate motion degradation of the acquired sequences). Brain: Mild generalized parenchymal atrophy. Small chronic cortically-based infarcts within the bilateral frontal and left parietal lobes, some of which were better appreciated on the prior brain MRI of 04/10/2021. Advanced patchy and confluent T2 FLAIR hyperintense signal abnormality within the  cerebral white matter, nonspecific but compatible with chronic small vessel disease. Multifocal chronic infarcts and/or foci of wallerian degeneration within the corpus callosum. As before, there are small T2 hyperintense foci within the bilateral basal ganglia, likely reflecting a combination of prominent perivascular spaces and chronic lacunar infarcts. Redemonstrated chronic lacunar infarct within the right thalamus. Redemonstrated moderate-sized chronic infarct within the inferomedial left cerebellar hemisphere. Associated chronic hemosiderin deposition at this site. There is no acute infarct. No evidence of an intracranial mass. No extra-axial fluid collection. No midline shift. Vascular: Maintained flow voids within the proximal large arterial vessels. Skull and upper cervical spine: No suspicious marrow lesion. Sinuses/Orbits: Right phthisis bulbi. No mass or acute finding within the imaged orbits. Left maxillary sinus atelectasis. Trace mucosal thickening scattered within the paranasal sinuses. Other: 5 mm round T2 hyperintense focus within the superficial lobe of the right parotid gland, which may reflect a cyst or primary parotid neoplasm (series 5, image 3). This is not 16 in size from the prior MRI of 04/10/2021. IMPRESSION: 1. Intermittently motion degraded examination. 2. No evidence of an acute intracranial abnormality. Specifically, the diffusion-weighted imaging is of good quality and there is no evidence of an acute infarct. 3. Parenchymal atrophy, advanced chronic small vessel disease and chronic infarcts as described. 4. 5 mm lesion within the right parotid gland, which may reflect a cyst or primary parotid neoplasm. This is unchanged in size from the prior MRI of 04/10/2021. Electronically Signed   By: Kellie Simmering D.O.   On: 12/17/2022 18:45   CT ABDOMEN PELVIS WO CONTRAST  Result Date: 12/17/2022 CLINICAL DATA:  77 year old male with history of acute onset of nonlocalized abdominal pain.  EXAM: CT ABDOMEN AND PELVIS WITHOUT CONTRAST TECHNIQUE: Multidetector CT imaging of the abdomen and pelvis was performed following the standard protocol without IV contrast. RADIATION DOSE REDUCTION: This exam was performed according to the departmental dose-optimization program which includes automated exposure control, adjustment of the mA and/or kV according to patient size and/or use of iterative reconstruction technique. COMPARISON:  CT  of the abdomen and pelvis 10/17/2022. FINDINGS: Lower chest: Areas of cylindrical bronchiectasis and what appear to be probable chronic post infectious or inflammatory areas of scarring in the lung bases, partially obscured by extensive patient respiratory motion. Atherosclerotic calcifications in the descending thoracic aorta as well as the left circumflex and right coronary arteries. Hepatobiliary: No definite suspicious cystic or solid hepatic lesions are confidently identified on today's noncontrast CT examination. Unenhanced appearance of the gallbladder is normal. Pancreas: No definite pancreatic mass or peripancreatic fluid collections or inflammatory changes are noted on today's noncontrast CT examination. Spleen: Unremarkable. Adrenals/Urinary Tract: Unenhanced appearance of the kidneys and bilateral adrenal glands is normal. No hydroureteronephrosis. Urinary bladder is moderately distended but otherwise unremarkable in appearance. Stomach/Bowel: Unenhanced appearance of the stomach is normal. There is no pathologic dilatation of small bowel or colon. The appendix is not confidently identified and may be surgically absent. Regardless, there are no inflammatory changes noted adjacent to the cecum to suggest the presence of an acute appendicitis at this time. Vascular/Lymphatic: Atherosclerotic calcifications are noted in the abdominal aorta and pelvic vasculature. No lymphadenopathy noted in the abdomen or pelvis. 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 Seminal vesicles are unremarkable in appearance. Other: No significant volume of ascites.  No pneumoperitoneum. Musculoskeletal: There are no aggressive appearing lytic or blastic lesions noted in the visualized portions of the skeleton. IMPRESSION: 1. No acute findings are noted in the abdomen or pelvis to account for the patient's symptoms. 2. Aortic atherosclerosis, in addition to least 2 vessel coronary artery disease. Assessment for potential risk factor modification, dietary therapy or pharmacologic therapy may be warranted, if clinically indicated. 3. Areas of bronchiectasis and apparent postinfectious scarring in the lung bases, as above. 4. Additional incidental findings, as above. Electronically Signed   By: Vinnie Langton M.D.   On: 12/17/2022 17:45   CT Head Wo Contrast  Result Date: 12/17/2022 CLINICAL DATA:  Nausea, short of breath, diaphoresis, syncope EXAM: CT HEAD WITHOUT CONTRAST TECHNIQUE: Contiguous axial images were obtained from the base of the skull through the vertex without intravenous contrast. RADIATION DOSE REDUCTION: This exam was performed according to the departmental dose-optimization program which includes automated exposure control, adjustment of the mA and/or kV according to patient size and/or use of iterative reconstruction technique. COMPARISON:  04/08/2021, 04/10/2021 FINDINGS: Brain: Diffuse cerebral atrophy. Stable areas of encephalomalacia within the left cerebellar hemisphere and right frontal cortex. Chronic small vessel ischemic changes are again seen throughout the periventricular white matter, left insula, and bilateral basal ganglia. No evidence of acute infarct or hemorrhage. Lateral ventricles and remaining midline structures are unremarkable. No acute extra-axial fluid collections. No mass effect. Vascular: Diffuse atherosclerosis.  No hyperdense vessel. Skull: Normal. Negative for fracture or focal  lesion. Sinuses/Orbits: No acute finding.  Right phthisis bulbi. Other: None. IMPRESSION: 1. No acute intracranial process. 2. Stable chronic ischemic changes as above. Electronically Signed   By: Randa Ngo M.D.   On: 12/17/2022 17:43   DG Chest 2 View  Result Date: 12/17/2022 CLINICAL DATA:  Short of breath, nausea, diaphoresis EXAM: CHEST - 2 VIEW COMPARISON:  10/15/2016 FINDINGS: Frontal and lateral views of the chest demonstrate an unremarkable cardiac silhouette. No acute airspace disease, effusion, or pneumothorax. No acute bony abnormalities. IMPRESSION: 1. No acute intrathoracic process. Electronically Signed   By: Randa Ngo M.D.   On: 12/17/2022 15:11    Procedures Procedures    Medications Ordered in  ED Medications  lactated ringers bolus 500 mL (0 mLs Intravenous Stopped 12/17/22 1735)    ED Course/ Medical Decision Making/ A&P                             Medical Decision Making Amount and/or Complexity of Data Reviewed Labs: ordered. Radiology: ordered.   This patient presents to the ED for concern of nausea, vomiting, syncope, this involves an extensive number of treatment options, and is a complaint that carries with it a high risk of complications and morbidity.  The differential diagnosis includes arrhythmia, ACS, GERD, foodborne illness, enteritis, polypharmacy, vasovagal episode, hypoglycemia, other metabolic derangement, CVA, TIA, seizure   Co morbidities that complicate the patient evaluation  HLD, cardiomyopathy, prior substance abuse, anemia, CVA, HTN, hypothyroidism, CHF   Additional history obtained:  Additional history obtained from patient's daughter External records from outside source obtained and reviewed including EMR   Lab Tests:  I Ordered, and personally interpreted labs.  The pertinent results include: Baseline creatinine, hypomagnesemia with otherwise normal electrolytes, normal troponins, normal BNP, normal hemoglobin, no  leukocytosis, normal hepatobiliary enzymes, normal lipase   Imaging Studies ordered:  I ordered imaging studies including chest x-ray, CT head, CT of abdomen and pelvis, MR brain I independently visualized and interpreted imaging which showed no acute findings. I agree with the radiologist interpretation   Cardiac Monitoring: / EKG:  The patient was maintained on a cardiac monitor.  I personally viewed and interpreted the cardiac monitored which showed an underlying rhythm of: Sinus rhythm  Problem List / ED Course / Critical interventions / Medication management  Patient presents following an episode described by his daughter as nausea, vomiting, diaphoresis, possible syncope, and subsequent generalized weakness.  Vital signs normal on arrival.  Patient is awake and alert.  He is disoriented which his daughter confirms is baseline for him.  He has no focal neurologic deficits on exam.  Although initially endorse generalized abdominal pain, he stated that this had resolved shortly thereafter.  Vital signs are reassuring.  Patient is placed on bedside cardiac monitor.  Diagnostic workup was initiated.  Lab work is unremarkable.  Given his transient abdominal pain as well as his episodes of vomiting, patient underwent CT imaging of abdomen pelvis.  No acute findings were identified.  Given concern for seizure, TIA, CVA, patient underwent CT and MRI imaging of head.  Again, no acute findings were identified.  On cardiac monitor, patient maintained normal sinus rhythm.  He remained normotensive.  On reassessment, patient states that he continues to feel fine.  Given his known atherosclerotic disease and story concerning for syncope, patient was offered admission for syncope observation.  Patient declined this stating that he would prefer to go home.  His daughter remains at bedside and does feel comfortable with this.  He was encouraged to return at any time if he changes his mind or has any other  further concerning symptoms.  Patient was discharged in stable condition. I ordered medication including IVF for hydration Reevaluation of the patient after these medicines showed that the patient improved I have reviewed the patients home medicines and have made adjustments as needed   Social Determinants of Health:  Lives at home with family, has access to outpatient care         Final Clinical Impression(s) / ED Diagnoses Final diagnoses:  Vomiting, unspecified vomiting type, unspecified whether nausea present  Syncope, unspecified syncope type  Rx / DC Orders ED Discharge Orders          Ordered    Ambulatory referral to Cardiology       Comments: If you have not heard from the Cardiology office within the next 72 hours please call [phone].   12/17/22 2013              Godfrey Pick, MD 12/17/22 2014

## 2022-12-17 NOTE — ED Notes (Signed)
Checked on patient. They have family at bedside. Patient is doing well, and provider had just left the room. Family and patient says he's going to be discharged. Will be on the lookout for information.

## 2022-12-21 ENCOUNTER — Other Ambulatory Visit: Payer: Self-pay | Admitting: Cardiology

## 2022-12-25 ENCOUNTER — Other Ambulatory Visit: Payer: Self-pay | Admitting: Internal Medicine

## 2022-12-26 LAB — CBC
HCT: 39.9 % (ref 38.5–50.0)
Hemoglobin: 13.5 g/dL (ref 13.2–17.1)
MCH: 28.9 pg (ref 27.0–33.0)
MCHC: 33.8 g/dL (ref 32.0–36.0)
MCV: 85.4 fL (ref 80.0–100.0)
MPV: 10.4 fL (ref 7.5–12.5)
Platelets: 267 10*3/uL (ref 140–400)
RBC: 4.67 10*6/uL (ref 4.20–5.80)
RDW: 12.7 % (ref 11.0–15.0)
WBC: 5.7 10*3/uL (ref 3.8–10.8)

## 2022-12-26 LAB — COMPLETE METABOLIC PANEL WITH GFR
AG Ratio: 1.1 (calc) (ref 1.0–2.5)
ALT: 9 U/L (ref 9–46)
AST: 11 U/L (ref 10–35)
Albumin: 4 g/dL (ref 3.6–5.1)
Alkaline phosphatase (APISO): 50 U/L (ref 35–144)
BUN/Creatinine Ratio: 19 (calc) (ref 6–22)
BUN: 28 mg/dL — ABNORMAL HIGH (ref 7–25)
CO2: 21 mmol/L (ref 20–32)
Calcium: 10 mg/dL (ref 8.6–10.3)
Chloride: 103 mmol/L (ref 98–110)
Creat: 1.49 mg/dL — ABNORMAL HIGH (ref 0.70–1.28)
Globulin: 3.6 g/dL (calc) (ref 1.9–3.7)
Glucose, Bld: 140 mg/dL — ABNORMAL HIGH (ref 65–99)
Potassium: 4.4 mmol/L (ref 3.5–5.3)
Sodium: 136 mmol/L (ref 135–146)
Total Bilirubin: 0.5 mg/dL (ref 0.2–1.2)
Total Protein: 7.6 g/dL (ref 6.1–8.1)
eGFR: 48 mL/min/{1.73_m2} — ABNORMAL LOW (ref >=60)

## 2022-12-26 LAB — LIPID PANEL
Cholesterol: 152 mg/dL (ref <200)
HDL: 26 mg/dL — ABNORMAL LOW (ref >=40)
LDL Cholesterol (Calc): 93 mg/dL (calc)
Non-HDL Cholesterol (Calc): 126 mg/dL (calc) (ref <130)
Total CHOL/HDL Ratio: 5.8 (calc) — ABNORMAL HIGH (ref <5.0)
Triglycerides: 239 mg/dL — ABNORMAL HIGH (ref <150)

## 2022-12-26 LAB — TSH: TSH: 2.39 mIU/L (ref 0.40–4.50)

## 2022-12-26 LAB — T4, FREE: Free T4: 1.5 ng/dL (ref 0.8–1.8)

## 2023-02-02 ENCOUNTER — Ambulatory Visit: Payer: Medicare Other | Admitting: Cardiology

## 2023-02-10 ENCOUNTER — Other Ambulatory Visit: Payer: Self-pay | Admitting: Student

## 2023-03-14 NOTE — Progress Notes (Signed)
Cardiology Office Note:   Date:  03/16/2023  ID:  Brad Harrell, DOB Mar 26, 1946, MRN [id number]  History of Present Illness:   Brad Harrell is a 77 y.o. male who presents for evaluation of diastolic HF.  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  Cardiology service was consulted due to his chronic combined heart failure and suspicion for cardioembolic CVA.  Previous echocardiogram obtained on 03/24/2021 showed EF 25 to 30%.  During this admission, he was placed on appropriate heart failure medication and recommend outpatient follow-up with cardiology service.  Unfortunately, he went back to the hospital with recurrent CVA.  MRI of the brain confirmed acute cortical infarction of the left MCA distribution.  TTE with bubble study performed on 04/11/2021 showed EF 25 to 30%, severe LV dysfunction, LV global hypokinesis, grade 1 DD, mild LVH.  He was placed on 325 mg daily of aspirin and a 75 mg daily of Plavix by neurology service.  We have been titrating his medications.  His last ejection fraction was 30 to 35% in Nov 2021.  This was slightly improved. He did have a perfusion study.  This demonstrated an old inferior infarct but no ischemia and he was managed medically.   His EF on follow up was still 30 to 35% and essentially unchanged.   This with his daughter.  His son also was in the house helping when his daughter was at work.  He gets around for the most part in a wheelchair only when he needs to go a long distance.  He can use a walker in the house.  The patient denies any new symptoms such as chest discomfort, neck or arm discomfort. There has been no new shortness of breath, PND or orthopnea. There have been no reported palpitations, presyncope or syncope.   ROS: As stated in the HPI and negative for all other systems.  Studies Reviewed:    EKG:  NA    Risk Assessment/Calculations:              Physical Exam:   VS:  BP (!) 132/56 (BP Location:  Right Arm, Patient Position: Sitting, Cuff Size: Large)   Pulse 96   Ht 5\' 4"  (1.626 m)   Wt 191 lb 9.6 oz (86.9 kg)   SpO2 95%   BMI 32.89 kg/m    Wt Readings from Last 3 Encounters:  03/16/23 191 lb 9.6 oz (86.9 kg)  12/17/22 177 lb (80.3 kg)  08/10/22 193 lb 9.6 oz (87.8 kg)     GEN: Well nourished, well developed in no acute distress NECK: No JVD; No carotid bruits CARDIAC: RRR, no murmurs, rubs, gallops RESPIRATORY:  Clear to auscultation without rales, wheezing or rhonchi  ABDOMEN: Soft, non-tender, non-distended EXTREMITIES:  No edema; No deformity   ASSESSMENT AND PLAN:   Chronic combined systolic and diastolic heart failure: He seems to be euvolemic.  I am going to titrate his metoprolol up to 100 mg daily.  The next step will be to increase his Entresto.   Hypertension:   This is being managed in the context of treating his heart failure.   Hyperlipidemia: LDL was 93 with an HDL of 26.  I would like to increase his Lipitor to 80 mg daily.  He can get a repeat lipid in about 3 months.    DM2: A1c was 5.3 although I do not have the most recent blood work.  He is due to get this  done by his primary physician next week.   History of CVA:   He aphasia and left hemiparesis.         Signed, Rollene Rotunda, MD

## 2023-03-16 ENCOUNTER — Ambulatory Visit: Payer: Medicare Other | Attending: Cardiology | Admitting: Cardiology

## 2023-03-16 ENCOUNTER — Encounter: Payer: Self-pay | Admitting: Cardiology

## 2023-03-16 VITALS — BP 132/56 | HR 96 | Ht 64.0 in | Wt 191.6 lb

## 2023-03-16 DIAGNOSIS — E785 Hyperlipidemia, unspecified: Secondary | ICD-10-CM | POA: Diagnosis present

## 2023-03-16 DIAGNOSIS — I5042 Chronic combined systolic (congestive) and diastolic (congestive) heart failure: Secondary | ICD-10-CM | POA: Insufficient documentation

## 2023-03-16 DIAGNOSIS — I10 Essential (primary) hypertension: Secondary | ICD-10-CM

## 2023-03-16 DIAGNOSIS — E118 Type 2 diabetes mellitus with unspecified complications: Secondary | ICD-10-CM | POA: Insufficient documentation

## 2023-03-16 DIAGNOSIS — Z7984 Long term (current) use of oral hypoglycemic drugs: Secondary | ICD-10-CM

## 2023-03-16 MED ORDER — METOPROLOL SUCCINATE ER 100 MG PO TB24: 100.0000 mg | ORAL_TABLET | Freq: Every day | ORAL | 3 refills | Status: DC

## 2023-03-16 MED ORDER — ATORVASTATIN CALCIUM 80 MG PO TABS: 80.0000 mg | ORAL_TABLET | Freq: Every day | ORAL | 3 refills | Status: DC

## 2023-03-16 NOTE — Patient Instructions (Addendum)
Medication Instructions:   INCREASE METOPROLOL TO 100 MG ONCE DAILY= 2 OF THE 50 MG TABLETS ONCE DAILY  INCREASE ATORVASTATIN TO 80 MG ONCE DAILY= 2 OF THE 40 MG TABLETS ONCE DAILY  *If you need a refill on your cardiac medications before your next appointment, please call your pharmacy*   Your physician recommends that you return for lab work in: 3 MONTHS-FASTING   Follow-Up: At [facility], you and your health needs are our priority.  As part of our continuing mission to provide you with exceptional heart care, we have created designated Provider Care Teams.  These Care Teams include your primary Cardiologist (physician) and Advanced Practice Providers (APPs -  Physician Assistants and Nurse Practitioners) who all work together to provide you with the care you need, when you need it.  We recommend signing up for the patient portal called "MyChart".  Sign up information is provided on this After Visit Summary.  MyChart is used to connect with patients for Virtual Visits (Telemedicine).  Patients are able to view lab/test results, encounter notes, upcoming appointments, etc.  Non-urgent messages can be sent to your provider as well.   To learn more about what you can do with MyChart, go to [email or web address].    Your next appointment:   2 month(s)  Provider:   ANY APP

## 2023-04-29 ENCOUNTER — Other Ambulatory Visit: Payer: Self-pay | Admitting: Student

## 2023-05-15 NOTE — Progress Notes (Unsigned)
Cardiology Office Note   Date:  05/16/2023  ID:  Brad Harrell, DOB [birth year], MRN [id number] PCP:  Fleet Contras, MD [facility] Cardiologist: Rollene Rotunda, MD  Reason for visit: 2 month follow-up  History of Present Illness    Brad Harrell is a 77 y.o. 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  He was last seen by Dr. Antoine Poche on Mar 16, 2023.  His son and daughter help him at home.  Uses a walker and wheelchair occasionally.  Patient appeared euvolemic on exam.  Metoprolol increased 100 mg daily.  With LDL 93, Lipitor increased to 80 mg daily.  Today, patient comes in with his daughter who he lives with.  He states the patient is doing well.  They deny hospitalizations for heart failure in the past.  He denies shortness of breath, PND, orthopnea, lower extremity edema and weight changes.  Also denies palpitations, chest pain, lightheadedness and syncope.  No bleeding issues with aspirin and Plavix.  Patient does note shortness of breath when walking a distance.  Note - since having ~4 strokes, patient ambulates with a walker for short distances and is in a wheelchair for long distances.  States compliance with medications, no issues with med costs.  BP at home 100-105/60s.     Objective / Physical Exam   Vital signs:  BP 96/68 (BP Location: Right Arm, Patient Position: Sitting, Cuff Size: Normal)   Pulse 74   Ht 5\' 5"  (1.651 m)   Wt 192 lb 3.2 oz (87.2 kg)   SpO2 97%   BMI 31.98 kg/m     GEN: No acute distress NECK: No carotid bruits CARDIAC: RRR, no murmurs RESPIRATORY:  Clear to auscultation without rales, wheezing or rhonchi  EXTREMITIES: No edema  Assessment and Plan   Chronic systolic and diastolic heart failure, euvolemic -Echo in April 2024 with EF 30 to 35%,  global hypokinesis, grade 1 diastolic dysfunction, normal RV, no significant valve disease. -Do not think we have BP room to titrate HF medications further. -Tolerating current therapy well. -Continue Jardiance 10 mg daily, Lasix 20 mg once daily, Toprol XL 100 mg daily, spironolactone 12.5 mg daily and Entresto 49-51 mg twice daily. -Check BMET today.   -As he is now on optimal tolerated therapy, will recheck echo to re-eval EF.  Pt does not have a ICD; pt has normal QRS.  History of CVA -First CVA in 2010, last in 2022 per pt. -No A-fib on cardiac monitor July/August 2022 -Continue aspirin/Plavix and statin therapy.  No bleeding issues.  Hypertension, well controlled  -Medications as above. -Goal BP is <130/80.  Recommend DASH diet (high in vegetables, fruits, low-fat dairy products, whole grains, poultry, fish, and nuts and low in sweets, sugar-sweetened beverages, and red meats), salt restriction and increase physical activity.  Hyperlipidemia -With LDL 93, Lipitor increased to 80 mg daily in 02/2023. -Check fasting lipids today.   -If LDL remains >70, will consider adding Zetia 10mg  daily.   -Given patient education on low cholesterol diet. -Discussed cholesterol lowering diets - Mediterranean diet, DASH diet, vegetarian diet, low-carbohydrate diet and avoidance of trans fats.  Discussed healthier choice substitutes.  Nuts, high-fiber foods, and fiber supplements may also improve lipids.    Disposition - Follow-up in 4 months with Dr. Antoine Poche or myself.   Signed, Victorino Dike  Arelia Longest, PA-C  05/16/2023 [facility]

## 2023-05-16 ENCOUNTER — Ambulatory Visit: Payer: Medicare Other | Attending: Physician Assistant | Admitting: Physician Assistant

## 2023-05-16 ENCOUNTER — Encounter: Payer: Self-pay | Admitting: Physician Assistant

## 2023-05-16 VITALS — BP 96/68 | HR 74 | Ht 65.0 in | Wt 192.2 lb

## 2023-05-16 DIAGNOSIS — E785 Hyperlipidemia, unspecified: Secondary | ICD-10-CM | POA: Diagnosis present

## 2023-05-16 DIAGNOSIS — I5042 Chronic combined systolic (congestive) and diastolic (congestive) heart failure: Secondary | ICD-10-CM | POA: Diagnosis present

## 2023-05-16 DIAGNOSIS — I10 Essential (primary) hypertension: Secondary | ICD-10-CM

## 2023-05-16 NOTE — Patient Instructions (Signed)
Medication Instructions:  No changes *If you need a refill on your cardiac medications before your next appointment, please call your pharmacy*   Lab Work: BMET, Lipid Panel If you have labs (blood work) drawn today and your tests are completely normal, you will receive your results only by: MyChart Message (if you have MyChart) OR A paper copy in the mail If you have any lab test that is abnormal or we need to change your treatment, we will call you to review the results.   Testing/Procedures: [address] [employer], [address]. Your physician has requested that you have an echocardiogram. Echocardiography is a painless test that uses sound waves to create images of your heart. It provides your doctor with information about the size and shape of your heart and how well your heart's chambers and valves are working. This procedure takes approximately one hour. There are no restrictions for this procedure. Please do NOT wear cologne, perfume, aftershave, or lotions (deodorant is allowed). Please arrive 15 minutes prior to your appointment time.    Follow-Up: At [facility], you and your health needs are our priority.  As part of our continuing mission to provide you with exceptional heart care, we have created designated Provider Care Teams.  These Care Teams include your primary Cardiologist (physician) and Advanced Practice Providers (APPs -  Physician Assistants and Nurse Practitioners) who all work together to provide you with the care you need, when you need it.  We recommend signing up for the patient portal called "MyChart".  Sign up information is provided on this After Visit Summary.  MyChart is used to connect with patients for Virtual Visits (Telemedicine).  Patients are able to view lab/test results, encounter notes, upcoming appointments, etc.  Non-urgent messages can be sent to your provider as well.   To learn more about what you can do with MyChart, go to  [email or web address].    Your next appointment:   4 month(s)  Provider:   Juanda Crumble, PA-C    or, Rollene Rotunda, MD

## 2023-05-17 LAB — LIPID PANEL
Chol/HDL Ratio: 5.7 ratio — ABNORMAL HIGH (ref 0.0–5.0)
Cholesterol, Total: 131 mg/dL (ref 100–199)
HDL: 23 mg/dL — ABNORMAL LOW (ref >39)
LDL Chol Calc (NIH): 77 mg/dL (ref 0–99)
Triglycerides: 181 mg/dL — ABNORMAL HIGH (ref 0–149)
VLDL Cholesterol Cal: 31 mg/dL (ref 5–40)

## 2023-05-17 LAB — BASIC METABOLIC PANEL
BUN/Creatinine Ratio: 17 (ref 10–24)
BUN: 23 mg/dL (ref 8–27)
CO2: 19 mmol/L — ABNORMAL LOW (ref 20–29)
Calcium: 9.3 mg/dL (ref 8.6–10.2)
Chloride: 105 mmol/L (ref 96–106)
Creatinine, Ser: 1.33 mg/dL — ABNORMAL HIGH (ref 0.76–1.27)
Glucose: 132 mg/dL — ABNORMAL HIGH (ref 70–99)
Potassium: 4.2 mmol/L (ref 3.5–5.2)
Sodium: 138 mmol/L (ref 134–144)
eGFR: 54 mL/min/{1.73_m2} — ABNORMAL LOW (ref >59)

## 2023-05-21 ENCOUNTER — Telehealth: Payer: Self-pay

## 2023-05-21 ENCOUNTER — Telehealth: Payer: Self-pay | Admitting: *Deleted

## 2023-05-21 DIAGNOSIS — E785 Hyperlipidemia, unspecified: Secondary | ICD-10-CM

## 2023-05-21 MED ORDER — ROSUVASTATIN CALCIUM 40 MG PO TABS: 40.0000 mg | ORAL_TABLET | Freq: Every day | ORAL | 3 refills | Status: DC

## 2023-05-21 NOTE — Telephone Encounter (Signed)
-----   Message from Rollene Rotunda sent at 05/19/2023  5:33 PM EDT ----- He is still not at target with his lipids.  Please stop the Lipitor and start Crestor 40 mg PO daily and repeat a lipid profile in 3 months.  Call Mr. Lebarron with the results and send results to Fleet Contras, MD

## 2023-05-21 NOTE — Telephone Encounter (Addendum)
Called patient regarding results. Left detailed message for patient . Letter mailed to patient 05/21/23----- Message from Cannon Kettle sent at 05/18/2023 10:48 AM EDT ----- Kidney function improved from last check and seems to be at baseline.   Potassium normal. Continue current medical therapy.

## 2023-05-21 NOTE — Telephone Encounter (Signed)
Spoke with pt daughter, aware of results and medication change. New script sent to the pharmacy  Lab orders mailed to the pt

## 2023-06-11 ENCOUNTER — Other Ambulatory Visit: Payer: Self-pay

## 2023-06-11 ENCOUNTER — Ambulatory Visit (HOSPITAL_COMMUNITY): Payer: Medicare Other | Attending: Physician Assistant

## 2023-06-11 ENCOUNTER — Telehealth: Payer: Self-pay

## 2023-06-11 DIAGNOSIS — I5042 Chronic combined systolic (congestive) and diastolic (congestive) heart failure: Secondary | ICD-10-CM | POA: Diagnosis not present

## 2023-06-11 DIAGNOSIS — E785 Hyperlipidemia, unspecified: Secondary | ICD-10-CM

## 2023-06-11 DIAGNOSIS — I10 Essential (primary) hypertension: Secondary | ICD-10-CM | POA: Diagnosis not present

## 2023-06-11 LAB — ECHOCARDIOGRAM COMPLETE
Area-P 1/2: 2.33 cm2
S' Lateral: 4.1 cm

## 2023-06-11 MED ORDER — ROSUVASTATIN CALCIUM 40 MG PO TABS: 40.0000 mg | ORAL_TABLET | Freq: Every day | ORAL | 3 refills | Status: DC

## 2023-06-11 NOTE — Telephone Encounter (Addendum)
Called patient regarding results. Spoke with Rinaldo Cloud patients daughter . Patients daughter had understanding of results.----- Message from Cannon Kettle sent at 06/11/2023  1:51 PM EDT ----- Systolic function slightly improved from 30 to 35% to now 35-40%.  No significant valve disease.  No evidence of fluid overload.  Continue medications to support heart function - Jardiance 10 mg daily, Lasix 20 mg once daily, Toprol XL 100 mg daily, spironolactone 12.5 mg daily and Entresto 49-51 mg twice daily.  Further medication titration limited by soft blood pressure.

## 2023-08-06 ENCOUNTER — Other Ambulatory Visit: Payer: Self-pay | Admitting: Cardiology

## 2023-09-04 ENCOUNTER — Encounter: Payer: Self-pay | Admitting: *Deleted

## 2023-09-18 NOTE — Progress Notes (Deleted)
Cardiology Office Note:   Date:  09/18/2023  ID:  DAX VOLD, DOB 05-02-1946, MRN [id number] PCP: Fleet Contras, MD  [facility] Providers Cardiologist:  Rollene Rotunda, MD {  History of Present Illness:   Brad Harrell is a 77 y.o. male who presents for evaluation of diastolic HF.  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  Cardiology service was consulted due to his chronic combined heart failure and suspicion for cardioembolic CVA.  Previous echocardiogram obtained on 03/24/2021 showed EF 25 to 30%.  During this admission, he was placed on appropriate heart failure medication and recommend outpatient follow-up with cardiology service.  Unfortunately, he went back to the hospital with recurrent CVA.  MRI of the brain confirmed acute cortical infarction of the left MCA distribution.  TTE with bubble study performed on 04/11/2021 showed EF 25 to 30%, severe LV dysfunction, LV global hypokinesis, grade 1 DD, mild LVH.  He was placed on 325 mg daily of aspirin and a 75 mg daily of Plavix by neurology service.  We have been titrating his medications.  His last ejection fraction was 30 to 35% in Nov 2021.  This was slightly improved. He did have a perfusion study.  This demonstrated an old inferior infarct but no ischemia and he was managed medically.   His EF on follow up was still 30 to 35% and essentially unchanged in August.    ***  ***  This with his daughter.  His son also was in the house helping when his daughter was at work.  He gets around for the most part in a wheelchair only when he needs to go a long distance.  He can use a walker in the house.  The patient denies any new symptoms such as chest discomfort, neck or arm discomfort. There has been no new shortness of breath, PND or orthopnea. There have been no reported palpitations, presyncope or syncope.   ROS: ***  Studies Reviewed:    EKG:       ***  Risk  Assessment/Calculations:   {Does this patient have ATRIAL FIBRILLATION?:[phone]} No BP recorded.  {Refresh Note OR Click here to enter BP  :1}***        Physical Exam:   VS:  There were no vitals taken for this visit.   Wt Readings from Last 3 Encounters:  05/16/23 192 lb 3.2 oz (87.2 kg)  03/16/23 191 lb 9.6 oz (86.9 kg)  12/17/22 177 lb (80.3 kg)     GEN: Well nourished, well developed in no acute distress NECK: No JVD; No carotid bruits CARDIAC: ***RR, *** murmurs, rubs, gallops RESPIRATORY:  Clear to auscultation without rales, wheezing or rhonchi  ABDOMEN: Soft, non-tender, non-distended EXTREMITIES:  No edema; No deformity   ASSESSMENT AND PLAN:   Chronic combined systolic and diastolic heart failure:   ***  He seems to be euvolemic.  I am going to titrate his metoprolol up to 100 mg daily.  The next step will be to increase his Entresto.    Hypertension:   ***  This is being managed in the context of treating his heart failure.   Hyperlipidemia: LDL was ***  93 with an HDL of 26.  I would like to increase his Lipitor to 80 mg daily.  He can get a repeat lipid in about 3 months.     DM2: A1c was ***  5.3 although I do not have the most recent blood  work.  He is due to get this done by his primary physician next week.    History of CVA:   ***  He aphasia and left hemiparesis.      Follow up ***  Signed, Rollene Rotunda, MD

## 2023-09-20 ENCOUNTER — Ambulatory Visit: Payer: Medicare Other | Admitting: Cardiology

## 2023-09-20 DIAGNOSIS — I5042 Chronic combined systolic (congestive) and diastolic (congestive) heart failure: Secondary | ICD-10-CM

## 2023-09-20 DIAGNOSIS — E785 Hyperlipidemia, unspecified: Secondary | ICD-10-CM

## 2023-09-20 DIAGNOSIS — E118 Type 2 diabetes mellitus with unspecified complications: Secondary | ICD-10-CM

## 2023-09-20 DIAGNOSIS — I10 Essential (primary) hypertension: Secondary | ICD-10-CM

## 2023-09-20 DIAGNOSIS — I639 Cerebral infarction, unspecified: Secondary | ICD-10-CM

## 2023-09-21 NOTE — Progress Notes (Unsigned)
  Cardiology Office Note:   Date:  09/24/2023  ID:  Brad Harrell, DOB August 01, 1946, MRN [id number] PCP: Fleet Contras, MD  [facility] Providers Cardiologist:  Rollene Rotunda, MD {  History of Present Illness:   Brad Harrell is a 77 y.o. 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    Since he was last seen he has done well.  The patient denies any new symptoms such as chest discomfort, neck or arm discomfort. There has been no new shortness of breath, PND or orthopnea. There have been no reported palpitations, presyncope or syncope.   He gets around with his walker and has hemiparesis with previous strokes.  He has had four strokes with the last being 2022.    ROS: As stated in the HPI and negative for all other systems.  Studies Reviewed:    EKG:   EKG Interpretation Date/Time:  Monday September 24 2023 10:15:51 EST Ventricular Rate:  83 PR Interval:  184 QRS Duration:  114 QT Interval:  342 QTC Calculation: 401 R Axis:   -13  Text Interpretation: Normal sinus rhythm Low voltage QRS Nonspecific T wave abnormality When compared with ECG of 17-Dec-2022 14:42, Questionable change in QRS axis Nonspecific T wave abnormality, worse in Lateral leads QT has shortened Confirmed by Rollene Rotunda ([id number]) on 09/24/2023 10:43:44 AM     Risk Assessment/Calculations:              Physical Exam:   VS:  BP (!) 90/56 (BP Location: Right Arm, Patient Position: Sitting, Cuff Size: Large)   Pulse 82   Ht 5\' 4"  (1.626 m)   Wt 194 lb (88 kg)   SpO2 94%   BMI 33.30 kg/m    Wt Readings from Last 3 Encounters:  09/24/23 194 lb (88 kg)  05/16/23 192 lb 3.2 oz (87.2 kg)  03/16/23 191 lb 9.6 oz (86.9 kg)     GEN: Well nourished, well developed in no acute distress NECK: No JVD; No carotid  bruits CARDIAC: RRR, no murmurs, rubs, gallops RESPIRATORY:  Clear to auscultation without rales, wheezing or rhonchi  ABDOMEN: Soft, non-tender, non-distended EXTREMITIES:  No edema; No deformity   ASSESSMENT AND PLAN:   Chronic systolic and diastolic heart failure:   He is euvolemic.    He is on OMT.  No change in therapy.   History of CVA:   He remains on DAPT.   Hypertension:   BP is well controlled.  No change in therapy.   Hyperlipidemia:   LDL was 77.  No change in therapy.   improve lipids.       Follow up with APP in six years.    Signed, Rollene Rotunda, MD

## 2023-09-24 ENCOUNTER — Encounter: Payer: Self-pay | Admitting: Cardiology

## 2023-09-24 ENCOUNTER — Ambulatory Visit: Payer: Medicare Other | Attending: Cardiology | Admitting: Cardiology

## 2023-09-24 VITALS — BP 90/56 | HR 82 | Ht 64.0 in | Wt 194.0 lb

## 2023-09-24 DIAGNOSIS — I5042 Chronic combined systolic (congestive) and diastolic (congestive) heart failure: Secondary | ICD-10-CM

## 2023-09-24 DIAGNOSIS — I10 Essential (primary) hypertension: Secondary | ICD-10-CM

## 2023-09-24 DIAGNOSIS — E785 Hyperlipidemia, unspecified: Secondary | ICD-10-CM | POA: Diagnosis present

## 2023-09-24 NOTE — Patient Instructions (Signed)
Medication Instructions:  No changes.  *If you need a refill on your cardiac medications before your next appointment, please call your pharmacy*    Follow-Up: At [facility], you and your health needs are our priority.  As part of our continuing mission to provide you with exceptional heart care, we have created designated Provider Care Teams.  These Care Teams include your primary Cardiologist (physician) and Advanced Practice Providers (APPs -  Physician Assistants and Nurse Practitioners) who all work together to provide you with the care you need, when you need it.  We recommend signing up for the patient portal called "MyChart".  Sign up information is provided on this After Visit Summary.  MyChart is used to connect with patients for Virtual Visits (Telemedicine).  Patients are able to view lab/test results, encounter notes, upcoming appointments, etc.  Non-urgent messages can be sent to your provider as well.   To learn more about what you can do with MyChart, go to [email or web address].    Your next appointment:   6 month(s)  Provider:   Marjie Skiff, PA-C

## 2024-03-13 ENCOUNTER — Other Ambulatory Visit: Payer: Self-pay | Admitting: Cardiology

## 2024-04-26 ENCOUNTER — Encounter (HOSPITAL_COMMUNITY): Payer: Self-pay | Admitting: Interventional Radiology

## 2024-04-30 ENCOUNTER — Other Ambulatory Visit: Payer: Self-pay | Admitting: Cardiology

## 2024-06-12 ENCOUNTER — Other Ambulatory Visit: Payer: Self-pay | Admitting: Physician Assistant

## 2024-06-12 DIAGNOSIS — E785 Hyperlipidemia, unspecified: Secondary | ICD-10-CM

## 2024-06-12 MED ORDER — ROSUVASTATIN CALCIUM 40 MG PO TABS: 40.0000 mg | ORAL_TABLET | Freq: Every day | ORAL | 0 refills | Status: DC

## 2024-09-02 ENCOUNTER — Other Ambulatory Visit: Payer: Self-pay | Admitting: Cardiology

## 2024-09-16 ENCOUNTER — Other Ambulatory Visit: Payer: Self-pay | Admitting: Cardiology

## 2024-09-16 DIAGNOSIS — E785 Hyperlipidemia, unspecified: Secondary | ICD-10-CM

## 2024-10-25 ENCOUNTER — Other Ambulatory Visit: Payer: Self-pay | Admitting: Cardiology

## 2024-11-27 ENCOUNTER — Other Ambulatory Visit: Payer: Self-pay | Admitting: Cardiology
